# Patient Record
Sex: Male | Born: 1960 | Race: White | Hispanic: No | Smoking: Current every day smoker
Health system: Southern US, Community
[De-identification: ages and names within clinical notes are randomized; demographics above are authoritative.]

## PROBLEM LIST (undated history)

## (undated) DIAGNOSIS — F1120 Opioid dependence, uncomplicated: Secondary | ICD-10-CM

## (undated) DIAGNOSIS — F17200 Nicotine dependence, unspecified, uncomplicated: Secondary | ICD-10-CM

## (undated) DIAGNOSIS — J449 Chronic obstructive pulmonary disease, unspecified: Secondary | ICD-10-CM

## (undated) DIAGNOSIS — I2510 Atherosclerotic heart disease of native coronary artery without angina pectoris: Secondary | ICD-10-CM

## (undated) HISTORY — PX: CARDIAC SURGERY: SHX584

## (undated) HISTORY — PX: CERVICAL SPINE SURGERY: SHX589

## (undated) HISTORY — PX: COLON SURGERY: SHX602

## (undated) HISTORY — PX: OTHER SURGICAL HISTORY: SHX169

---

## 2004-01-11 ENCOUNTER — Ambulatory Visit: Payer: Self-pay | Admitting: Cardiology

## 2004-01-25 ENCOUNTER — Ambulatory Visit: Payer: Self-pay | Admitting: Cardiology

## 2004-02-15 ENCOUNTER — Inpatient Hospital Stay (HOSPITAL_BASED_OUTPATIENT_CLINIC_OR_DEPARTMENT_OTHER): Admission: RE | Admit: 2004-02-15 | Discharge: 2004-02-15 | Payer: Self-pay | Admitting: Cardiology

## 2004-02-15 ENCOUNTER — Ambulatory Visit: Payer: Self-pay | Admitting: *Deleted

## 2004-03-12 ENCOUNTER — Ambulatory Visit: Payer: Self-pay | Admitting: Cardiology

## 2004-03-29 ENCOUNTER — Ambulatory Visit: Payer: Self-pay | Admitting: Cardiology

## 2004-04-18 ENCOUNTER — Inpatient Hospital Stay (HOSPITAL_COMMUNITY)
Admission: RE | Admit: 2004-04-18 | Discharge: 2004-04-22 | Payer: Self-pay | Admitting: Thoracic Surgery (Cardiothoracic Vascular Surgery)

## 2004-05-10 ENCOUNTER — Encounter
Admission: RE | Admit: 2004-05-10 | Discharge: 2004-05-10 | Payer: Self-pay | Admitting: Thoracic Surgery (Cardiothoracic Vascular Surgery)

## 2004-06-03 ENCOUNTER — Ambulatory Visit: Payer: Self-pay | Admitting: Cardiology

## 2004-10-24 ENCOUNTER — Ambulatory Visit: Admission: RE | Admit: 2004-10-24 | Discharge: 2004-10-24 | Payer: Self-pay | Admitting: *Deleted

## 2017-02-11 ENCOUNTER — Encounter (HOSPITAL_COMMUNITY): Payer: Self-pay | Admitting: Emergency Medicine

## 2017-02-11 ENCOUNTER — Emergency Department (HOSPITAL_COMMUNITY): Payer: Medicare Other

## 2017-02-11 ENCOUNTER — Emergency Department (HOSPITAL_COMMUNITY)
Admission: EM | Admit: 2017-02-11 | Discharge: 2017-02-11 | Disposition: A | Discharge status: Home / Self Care | Payer: Medicare Other | Attending: Emergency Medicine | Admitting: Emergency Medicine

## 2017-02-11 DIAGNOSIS — M545 Low back pain: Secondary | ICD-10-CM | POA: Insufficient documentation

## 2017-02-11 DIAGNOSIS — F17210 Nicotine dependence, cigarettes, uncomplicated: Secondary | ICD-10-CM | POA: Insufficient documentation

## 2017-02-11 DIAGNOSIS — R1030 Lower abdominal pain, unspecified: Secondary | ICD-10-CM | POA: Diagnosis present

## 2017-02-11 DIAGNOSIS — J449 Chronic obstructive pulmonary disease, unspecified: Secondary | ICD-10-CM | POA: Insufficient documentation

## 2017-02-11 DIAGNOSIS — R1084 Generalized abdominal pain: Secondary | ICD-10-CM

## 2017-02-11 HISTORY — DX: Chronic obstructive pulmonary disease, unspecified: J44.9

## 2017-02-11 LAB — CBC WITH DIFFERENTIAL/PLATELET
BASOS ABS: 0 10*3/uL (ref 0.0–0.1)
BASOS PCT: 0 %
Eosinophils Absolute: 0 10*3/uL (ref 0.0–0.7)
Eosinophils Relative: 0 %
HEMATOCRIT: 46.4 % (ref 39.0–52.0)
Hemoglobin: 16.3 g/dL (ref 13.0–17.0)
Lymphocytes Relative: 22 %
Lymphs Abs: 2 10*3/uL (ref 0.7–4.0)
MCH: 34 pg (ref 26.0–34.0)
MCHC: 35.1 g/dL (ref 30.0–36.0)
MCV: 96.7 fL (ref 78.0–100.0)
MONO ABS: 0.6 10*3/uL (ref 0.1–1.0)
Monocytes Relative: 7 %
NEUTROS ABS: 6.4 10*3/uL (ref 1.7–7.7)
Neutrophils Relative %: 71 %
PLATELETS: 247 10*3/uL (ref 150–400)
RBC: 4.8 MIL/uL (ref 4.22–5.81)
RDW: 14.3 % (ref 11.5–15.5)
WBC: 9.1 10*3/uL (ref 4.0–10.5)

## 2017-02-11 LAB — COMPREHENSIVE METABOLIC PANEL
ALBUMIN: 4.5 g/dL (ref 3.5–5.0)
ALK PHOS: 73 U/L (ref 38–126)
ALT: 17 U/L (ref 17–63)
ANION GAP: 9 (ref 5–15)
AST: 20 U/L (ref 15–41)
BILIRUBIN TOTAL: 0.9 mg/dL (ref 0.3–1.2)
BUN: 24 mg/dL — ABNORMAL HIGH (ref 6–20)
CALCIUM: 10.6 mg/dL — AB (ref 8.9–10.3)
CO2: 30 mmol/L (ref 22–32)
Chloride: 98 mmol/L — ABNORMAL LOW (ref 101–111)
Creatinine, Ser: 1.03 mg/dL (ref 0.61–1.24)
GLUCOSE: 106 mg/dL — AB (ref 65–99)
Potassium: 4.3 mmol/L (ref 3.5–5.1)
SODIUM: 137 mmol/L (ref 135–145)
TOTAL PROTEIN: 7.7 g/dL (ref 6.5–8.1)

## 2017-02-11 MED ORDER — POLYETHYLENE GLYCOL 3350 17 G PO PACK: 17.0000 g | PACK | Freq: Every day | ORAL | 0 refills | Status: DC

## 2017-02-11 MED ORDER — DICYCLOMINE HCL 20 MG PO TABS: 20.0000 mg | ORAL_TABLET | Freq: Two times a day (BID) | ORAL | 0 refills | Status: DC

## 2017-02-11 NOTE — ED Provider Notes (Signed)
Emergency Department Provider Note   I have reviewed the triage vital signs and the nursing notes.   HISTORY  Chief Complaint Abdominal Pain   HPI Antonio Ramirez is a 56 y.o. male with PMH of COPD and abdominal surgery 1-2 year prior presents to the emergency department for evaluation of pain in the lower abdomen and left lower back.  Patient states that he has had this pain for 8-9 months.  He states that he can feel something moving around inside of him.  He describes it as a flat piece of surgical material with a circle in the center.  He states he is unable to feel this from the outside but has had this discomfort for several months.  He reportedly followed up with his surgeon and primary care physician at [facility] who performed imaging which showed no foreign body.  Patient states that he got conflicting information from the provider and the radiology technician and believes that the medical team lied to him about leaving something inside of him.  Nausea, vomiting, diarrhea.  No fevers or chills.  No sudden worsening pain.  States he came in today because "[name] lied" and he wants a second opinion.    Past Medical History:  Diagnosis Date  . COPD (chronic obstructive pulmonary disease) (HCC)     There are no active problems to display for this patient.   Past Surgical History:  Procedure Laterality Date  . cardiac bypass    . CARDIAC SURGERY    . COLON SURGERY      Current Outpatient Rx  . Order #: [id number] Class: Historical Med  . Order #: [id number] Class: Historical Med  . Order #: [id number] Class: Historical Med  . Order #: [id number] Class: Historical Med  . Order #: [id number] Class: Historical Med  . Order #: [id number] Class: Historical Med  . Order #: [id number] Class: Historical Med  . Order #: [id number] Class: Historical Med  . Order #: [id number] Class: Print  . Order #: [id number] Class: Print    Allergies Opana [oxymorphone hcl]  History reviewed. No pertinent family  history.  Social History Social History  Substance Use Topics  . Smoking status: Current Every Day Smoker    Packs/day: 1.00    Types: Cigarettes  . Smokeless tobacco: Never Used  . Alcohol use No    Review of Systems  Constitutional: No fever/chills Eyes: No visual changes. ENT: No sore throat. Cardiovascular: Denies chest pain. Respiratory: Denies shortness of breath. Gastrointestinal: Positive abdominal pain.  No nausea, no vomiting.  No diarrhea.  No constipation. Genitourinary: Negative for dysuria. Musculoskeletal: Negative for back pain. Skin: Negative for rash. Neurological: Negative for headaches, focal weakness or numbness.  10-point ROS otherwise negative.  ____________________________________________   PHYSICAL EXAM:  VITAL SIGNS: ED Triage Vitals [02/11/17 1100]  Enc Vitals Group     BP (!) 149/110     Pulse Rate (!) 117     Resp 18     Temp 98.2 F (36.8 C)     Temp Source Oral     SpO2 94 %     Weight 180 lb (81.6 kg)     Height 6' (1.829 m)     Pain Score 5   Constitutional: Alert and oriented. Well appearing and in no acute distress. Eyes: Conjunctivae are normal.  Head: Atraumatic. Nose: No congestion/rhinnorhea. Mouth/Throat: Mucous membranes are moist.  Neck: No stridor.  Cardiovascular: Normal rate, regular rhythm. Good peripheral circulation. Grossly normal heart sounds.   Respiratory: Normal respiratory effort.  No  retractions. Lungs CTAB. Gastrointestinal: Soft and nontender. No distention.  Musculoskeletal: No lower extremity tenderness nor edema. No gross deformities of extremities. Neurologic:  Normal speech and language. No gross focal neurologic deficits are appreciated.  Skin:  Skin is warm, dry and intact. No rash noted. Multiple scars noted over the abdomen.   ____________________________________________   LABS (all labs ordered are listed, but only abnormal results are displayed)  Labs Reviewed  COMPREHENSIVE METABOLIC  PANEL - Abnormal; Notable for the following:       Result Value   Chloride 98 (*)    Glucose, Bld 106 (*)    BUN 24 (*)    Calcium 10.6 (*)    All other components within normal limits  CBC WITH DIFFERENTIAL/PLATELET   ____________________________________________  RADIOLOGY  Dg Abdomen Acute W/chest  Result Date: 02/11/2017 CLINICAL DATA:  Abdominal pain. EXAM: DG ABDOMEN ACUTE W/ 1V CHEST COMPARISON:  Chest x-ray dated February 04, 2017. FINDINGS: Distended, air-filled transverse colon. Moderate stool burden, with stool and air seen in the rectum. No pneumoperitoneum. Multiple phleboliths in the pelvis. No radiopaque calculi or other significant radiographic abnormality is seen. Heart size and mediastinal contours are within normal limits. Both lungs are hyperinflated, but clear. IMPRESSION: 1. Distended, air-filled transverse colon, which could reflect a degree of dysmotility. No evidence of bowel obstruction. 2.  Prominent stool throughout the colon favors constipation. 3. COPD.  No active cardiopulmonary disease. Electronically Signed   By: Titus Dubin M.D.   On: 02/11/2017 12:40    ____________________________________________   PROCEDURES  Procedure(s) performed:   Procedures  None ____________________________________________   INITIAL IMPRESSION / ASSESSMENT AND PLAN / ED COURSE  Pertinent labs & imaging results that were available during my care of the patient were reviewed by me and considered in my medical decision making (see chart for details).  Patient presents to the emergency department for evaluation of lower abdominal pain and left lower back pain.  This is been present for 8-9 months.  Patient is convinced that his surgeon from [facility] left some surgical materials inside of him during a surgery 1-2 years prior.  Plans to be feeling these objects move around inside of his abdomen and lower back.  I am unable to palpate anything externally.  His  abdomen is soft and nontender throughout.  No significant worsening symptoms today.  Given the patient's tachycardia and claims plan for baseline labs and plain film the abdomen but discussed with him that I have extremely low suspicion that there is any retained foreign body given that it has been 1-2 years, he is showing no sign of infection, and that these objects would not be migratory in his body.   Plain films and labs negative. Will discharge home on Bentyl and Miralax. Patient to follow up with PCP. Discussed return precautions for any worsening pain. Discussed possibility of hernia with the patient and discussed when to come to the ED if that occurs. Considering that the patient may be having intermittent retractable hernias to explain what he is seeing and describing as something left inside of him after surgery.   At this time, I do not feel there is any life-threatening condition present. I have reviewed and discussed all results (EKG, imaging, lab, urine as appropriate), exam findings with patient. I have reviewed nursing notes and appropriate previous records.  I feel the patient is safe to be discharged home without further emergent workup. Discussed usual and customary return precautions. Patient and family (if present)  verbalize understanding and are comfortable with this plan.  Patient will follow-up with their primary care provider. If they do not have a primary care provider, information for follow-up has been provided to them. All questions have been answered.  ____________________________________________  FINAL CLINICAL IMPRESSION(S) / ED DIAGNOSES  Final diagnoses:  Generalized abdominal pain     MEDICATIONS GIVEN DURING THIS VISIT:  Medications - No data to display   NEW OUTPATIENT MEDICATIONS STARTED DURING THIS VISIT:  Discharge Medication List as of 02/11/2017  1:05 PM    START taking these medications   Details  dicyclomine (BENTYL) 20 MG tablet Take 1 tablet (20  mg total) by mouth 2 (two) times daily., Starting Wed 02/11/2017, Print    polyethylene glycol (MIRALAX / GLYCOLAX) packet Take 17 g by mouth daily., Starting Wed 02/11/2017, Print        Note:  This document was prepared using Dragon voice recognition software and may include unintentional dictation errors.  Nanda Quinton, MD Emergency Medicine    Ara Grandmaison, [facility], MD 02/11/17 1537

## 2017-02-11 NOTE — Discharge Instructions (Signed)

## 2017-02-11 NOTE — ED Triage Notes (Signed)
Pt reports abd pain since abd surgery x1-2 years. Pt denies fever,chills, n/v/d. Pt reports "something was left inside of me."

## 2017-02-11 NOTE — ED Notes (Signed)
Pt states had abd surgery at [facility] a few years ago. Pt states "he had something left in him from his surgery." denies nausea/ vomiting and diarrhea. Bowel movements are normal per pt.

## 2017-02-11 NOTE — ED Notes (Signed)
Patient transported to X-ray 

## 2019-06-01 ENCOUNTER — Encounter (HOSPITAL_COMMUNITY): Payer: Self-pay | Admitting: *Deleted

## 2019-06-01 ENCOUNTER — Emergency Department (HOSPITAL_COMMUNITY): Payer: Medicare Other

## 2019-06-01 ENCOUNTER — Inpatient Hospital Stay (HOSPITAL_COMMUNITY): Payer: Medicare Other

## 2019-06-01 ENCOUNTER — Inpatient Hospital Stay (HOSPITAL_COMMUNITY)
Admission: EM | Admit: 2019-06-01 | Discharge: 2019-06-03 | DRG: 190 | Disposition: A | Discharge status: Home Health | Payer: Medicare Other | Attending: Family Medicine | Admitting: Family Medicine

## 2019-06-01 ENCOUNTER — Other Ambulatory Visit: Payer: Self-pay

## 2019-06-01 DIAGNOSIS — R400 Somnolence: Secondary | ICD-10-CM | POA: Diagnosis present

## 2019-06-01 DIAGNOSIS — N179 Acute kidney failure, unspecified: Secondary | ICD-10-CM | POA: Diagnosis present

## 2019-06-01 DIAGNOSIS — Z79891 Long term (current) use of opiate analgesic: Secondary | ICD-10-CM

## 2019-06-01 DIAGNOSIS — Z20822 Contact with and (suspected) exposure to covid-19: Secondary | ICD-10-CM | POA: Diagnosis present

## 2019-06-01 DIAGNOSIS — R7303 Prediabetes: Secondary | ICD-10-CM | POA: Diagnosis present

## 2019-06-01 DIAGNOSIS — J449 Chronic obstructive pulmonary disease, unspecified: Secondary | ICD-10-CM | POA: Diagnosis not present

## 2019-06-01 DIAGNOSIS — Z720 Tobacco use: Secondary | ICD-10-CM | POA: Diagnosis not present

## 2019-06-01 DIAGNOSIS — Z888 Allergy status to other drugs, medicaments and biological substances status: Secondary | ICD-10-CM

## 2019-06-01 DIAGNOSIS — Z7982 Long term (current) use of aspirin: Secondary | ICD-10-CM | POA: Diagnosis not present

## 2019-06-01 DIAGNOSIS — G9341 Metabolic encephalopathy: Secondary | ICD-10-CM | POA: Diagnosis present

## 2019-06-01 DIAGNOSIS — I2510 Atherosclerotic heart disease of native coronary artery without angina pectoris: Secondary | ICD-10-CM | POA: Diagnosis present

## 2019-06-01 DIAGNOSIS — F1129 Opioid dependence with unspecified opioid-induced disorder: Secondary | ICD-10-CM | POA: Diagnosis not present

## 2019-06-01 DIAGNOSIS — R739 Hyperglycemia, unspecified: Secondary | ICD-10-CM | POA: Diagnosis not present

## 2019-06-01 DIAGNOSIS — M549 Dorsalgia, unspecified: Secondary | ICD-10-CM | POA: Diagnosis present

## 2019-06-01 DIAGNOSIS — I248 Other forms of acute ischemic heart disease: Secondary | ICD-10-CM | POA: Diagnosis present

## 2019-06-01 DIAGNOSIS — G8929 Other chronic pain: Secondary | ICD-10-CM | POA: Diagnosis present

## 2019-06-01 DIAGNOSIS — I4510 Unspecified right bundle-branch block: Secondary | ICD-10-CM | POA: Diagnosis present

## 2019-06-01 DIAGNOSIS — F1120 Opioid dependence, uncomplicated: Secondary | ICD-10-CM | POA: Diagnosis present

## 2019-06-01 DIAGNOSIS — J9602 Acute respiratory failure with hypercapnia: Secondary | ICD-10-CM | POA: Diagnosis present

## 2019-06-01 DIAGNOSIS — F17210 Nicotine dependence, cigarettes, uncomplicated: Secondary | ICD-10-CM | POA: Diagnosis present

## 2019-06-01 DIAGNOSIS — J441 Chronic obstructive pulmonary disease with (acute) exacerbation: Secondary | ICD-10-CM | POA: Diagnosis present

## 2019-06-01 DIAGNOSIS — Z7902 Long term (current) use of antithrombotics/antiplatelets: Secondary | ICD-10-CM | POA: Diagnosis not present

## 2019-06-01 DIAGNOSIS — F32A Depression, unspecified: Secondary | ICD-10-CM | POA: Diagnosis present

## 2019-06-01 DIAGNOSIS — J9601 Acute respiratory failure with hypoxia: Secondary | ICD-10-CM | POA: Diagnosis present

## 2019-06-01 DIAGNOSIS — Z7951 Long term (current) use of inhaled steroids: Secondary | ICD-10-CM | POA: Diagnosis not present

## 2019-06-01 DIAGNOSIS — E785 Hyperlipidemia, unspecified: Secondary | ICD-10-CM | POA: Diagnosis present

## 2019-06-01 DIAGNOSIS — F17200 Nicotine dependence, unspecified, uncomplicated: Secondary | ICD-10-CM | POA: Diagnosis present

## 2019-06-01 DIAGNOSIS — R778 Other specified abnormalities of plasma proteins: Secondary | ICD-10-CM

## 2019-06-01 DIAGNOSIS — I361 Nonrheumatic tricuspid (valve) insufficiency: Secondary | ICD-10-CM | POA: Diagnosis not present

## 2019-06-01 DIAGNOSIS — E860 Dehydration: Secondary | ICD-10-CM | POA: Diagnosis present

## 2019-06-01 DIAGNOSIS — Z79899 Other long term (current) drug therapy: Secondary | ICD-10-CM | POA: Diagnosis not present

## 2019-06-01 DIAGNOSIS — F329 Major depressive disorder, single episode, unspecified: Secondary | ICD-10-CM | POA: Diagnosis present

## 2019-06-01 DIAGNOSIS — Z951 Presence of aortocoronary bypass graft: Secondary | ICD-10-CM

## 2019-06-01 HISTORY — DX: Opioid dependence, uncomplicated: F11.20

## 2019-06-01 HISTORY — DX: Nicotine dependence, unspecified, uncomplicated: F17.200

## 2019-06-01 HISTORY — DX: Atherosclerotic heart disease of native coronary artery without angina pectoris: I25.10

## 2019-06-01 LAB — LIPID PANEL
Cholesterol: 179 mg/dL (ref 0–200)
HDL: 46 mg/dL (ref >40)
LDL Cholesterol: 113 mg/dL — ABNORMAL HIGH (ref 0–99)
Total CHOL/HDL Ratio: 3.9 RATIO
Triglycerides: 100 mg/dL (ref <150)
VLDL: 20 mg/dL (ref 0–40)

## 2019-06-01 LAB — BLOOD GAS, ARTERIAL
Acid-Base Excess: 5.3 mmol/L — ABNORMAL HIGH (ref 0.0–2.0)
Bicarbonate: 27.2 mmol/L (ref 20.0–28.0)
Drawn by: 41977
FIO2: 32
O2 Saturation: 99.6 %
Patient temperature: 37
pCO2 arterial: 61.6 mmHg — ABNORMAL HIGH (ref 32.0–48.0)
pH, Arterial: 7.323 — ABNORMAL LOW (ref 7.350–7.450)
pO2, Arterial: 171 mmHg — ABNORMAL HIGH (ref 83.0–108.0)

## 2019-06-01 LAB — BLOOD GAS, VENOUS
Acid-Base Excess: 4.1 mmol/L — ABNORMAL HIGH (ref 0.0–2.0)
Bicarbonate: 25.7 mmol/L (ref 20.0–28.0)
Delivery systems: POSITIVE
Drawn by: 1528
FIO2: 96
O2 Saturation: 96.2 %
Patient temperature: 37
pCO2, Ven: 65.7 mmHg — ABNORMAL HIGH (ref 44.0–60.0)
pH, Ven: 7.286 (ref 7.250–7.430)
pO2, Ven: 87.3 mmHg — ABNORMAL HIGH (ref 32.0–45.0)

## 2019-06-01 LAB — URINALYSIS, ROUTINE W REFLEX MICROSCOPIC
Bacteria, UA: NONE SEEN
Bilirubin Urine: NEGATIVE
Glucose, UA: NEGATIVE mg/dL
Hgb urine dipstick: NEGATIVE
Ketones, ur: NEGATIVE mg/dL
Leukocytes,Ua: NEGATIVE
Nitrite: NEGATIVE
Protein, ur: 30 mg/dL — AB
Specific Gravity, Urine: 1.026 (ref 1.005–1.030)
pH: 5 (ref 5.0–8.0)

## 2019-06-01 LAB — PROTIME-INR
INR: 1 (ref 0.8–1.2)
Prothrombin Time: 13.4 seconds (ref 11.4–15.2)

## 2019-06-01 LAB — COMPREHENSIVE METABOLIC PANEL
ALT: 11 U/L (ref 0–44)
AST: 18 U/L (ref 15–41)
Albumin: 3.6 g/dL (ref 3.5–5.0)
Alkaline Phosphatase: 58 U/L (ref 38–126)
Anion gap: 10 (ref 5–15)
BUN: 34 mg/dL — ABNORMAL HIGH (ref 6–20)
CO2: 27 mmol/L (ref 22–32)
Calcium: 10.6 mg/dL — ABNORMAL HIGH (ref 8.9–10.3)
Chloride: 96 mmol/L — ABNORMAL LOW (ref 98–111)
Creatinine, Ser: 1.54 mg/dL — ABNORMAL HIGH (ref 0.61–1.24)
GFR calc Af Amer: 56 mL/min — ABNORMAL LOW (ref >60)
GFR calc non Af Amer: 49 mL/min — ABNORMAL LOW (ref >60)
Glucose, Bld: 135 mg/dL — ABNORMAL HIGH (ref 70–99)
Potassium: 5.1 mmol/L (ref 3.5–5.1)
Sodium: 133 mmol/L — ABNORMAL LOW (ref 135–145)
Total Bilirubin: 0.6 mg/dL (ref 0.3–1.2)
Total Protein: 6.8 g/dL (ref 6.5–8.1)

## 2019-06-01 LAB — CBC WITH DIFFERENTIAL/PLATELET
Abs Immature Granulocytes: 0.02 10*3/uL (ref 0.00–0.07)
Basophils Absolute: 0 10*3/uL (ref 0.0–0.1)
Basophils Relative: 0 %
Eosinophils Absolute: 0 10*3/uL (ref 0.0–0.5)
Eosinophils Relative: 0 %
HCT: 54.6 % — ABNORMAL HIGH (ref 39.0–52.0)
Hemoglobin: 17.1 g/dL — ABNORMAL HIGH (ref 13.0–17.0)
Immature Granulocytes: 0 %
Lymphocytes Relative: 12 %
Lymphs Abs: 0.9 10*3/uL (ref 0.7–4.0)
MCH: 33.4 pg (ref 26.0–34.0)
MCHC: 31.3 g/dL (ref 30.0–36.0)
MCV: 106.6 fL — ABNORMAL HIGH (ref 80.0–100.0)
Monocytes Absolute: 1 10*3/uL (ref 0.1–1.0)
Monocytes Relative: 13 %
Neutro Abs: 5.8 10*3/uL (ref 1.7–7.7)
Neutrophils Relative %: 75 %
Platelets: 249 10*3/uL (ref 150–400)
RBC: 5.12 MIL/uL (ref 4.22–5.81)
RDW: 17.2 % — ABNORMAL HIGH (ref 11.5–15.5)
WBC: 7.8 10*3/uL (ref 4.0–10.5)
nRBC: 0.3 % — ABNORMAL HIGH (ref 0.0–0.2)

## 2019-06-01 LAB — HEMOGLOBIN A1C
Hgb A1c MFr Bld: 6.2 % — ABNORMAL HIGH (ref 4.8–5.6)
Mean Plasma Glucose: 131.24 mg/dL

## 2019-06-01 LAB — BRAIN NATRIURETIC PEPTIDE: B Natriuretic Peptide: 535 pg/mL — ABNORMAL HIGH (ref 0.0–100.0)

## 2019-06-01 LAB — TROPONIN I (HIGH SENSITIVITY)
Troponin I (High Sensitivity): 822 ng/L (ref <18)
Troponin I (High Sensitivity): 982 ng/L (ref <18)
Troponin I (High Sensitivity): 763 ng/L (ref <18)

## 2019-06-01 LAB — ECHOCARDIOGRAM COMPLETE
Height: 72 in
Weight: 2730.18 oz

## 2019-06-01 LAB — SALICYLATE LEVEL: Salicylate Lvl: <7 mg/dL — ABNORMAL LOW (ref 7.0–30.0)

## 2019-06-01 LAB — GLUCOSE, CAPILLARY
Glucose-Capillary: 151 mg/dL — ABNORMAL HIGH (ref 70–99)
Glucose-Capillary: 135 mg/dL — ABNORMAL HIGH (ref 70–99)
Glucose-Capillary: 124 mg/dL — ABNORMAL HIGH (ref 70–99)
Glucose-Capillary: 111 mg/dL — ABNORMAL HIGH (ref 70–99)

## 2019-06-01 LAB — RAPID URINE DRUG SCREEN, HOSP PERFORMED
Amphetamines: NOT DETECTED
Barbiturates: NOT DETECTED
Benzodiazepines: NOT DETECTED
Cocaine: NOT DETECTED
Opiates: POSITIVE — AB
Tetrahydrocannabinol: POSITIVE — AB

## 2019-06-01 LAB — AMMONIA: Ammonia: 40 umol/L — ABNORMAL HIGH (ref 9–35)

## 2019-06-01 LAB — FOLATE: Folate: 8.4 ng/mL (ref >5.9)

## 2019-06-01 LAB — ETHANOL: Alcohol, Ethyl (B): <10 mg/dL (ref <10)

## 2019-06-01 LAB — LACTIC ACID, PLASMA
Lactic Acid, Venous: 1.9 mmol/L (ref 0.5–1.9)
Lactic Acid, Venous: 1.3 mmol/L (ref 0.5–1.9)

## 2019-06-01 LAB — RESPIRATORY PANEL BY RT PCR (FLU A&B, COVID)
Influenza A by PCR: NEGATIVE
Influenza B by PCR: NEGATIVE
SARS Coronavirus 2 by RT PCR: NEGATIVE

## 2019-06-01 LAB — ACETAMINOPHEN LEVEL: Acetaminophen (Tylenol), Serum: <10 ug/mL — ABNORMAL LOW (ref 10–30)

## 2019-06-01 LAB — HIV ANTIBODY (ROUTINE TESTING W REFLEX): HIV Screen 4th Generation wRfx: NONREACTIVE

## 2019-06-01 LAB — VITAMIN D 25 HYDROXY (VIT D DEFICIENCY, FRACTURES): Vit D, 25-Hydroxy: 40.53 ng/mL (ref 30–100)

## 2019-06-01 LAB — HEPARIN LEVEL (UNFRACTIONATED): Heparin Unfractionated: <0.1 IU/mL — ABNORMAL LOW (ref 0.30–0.70)

## 2019-06-01 LAB — VITAMIN B12: Vitamin B-12: 559 pg/mL (ref 180–914)

## 2019-06-01 LAB — TSH: TSH: 0.447 u[IU]/mL (ref 0.350–4.500)

## 2019-06-01 LAB — APTT: aPTT: 29 seconds (ref 24–36)

## 2019-06-01 MED ORDER — SODIUM CHLORIDE 0.9 % IV SOLN
100.0000 mg | Freq: Two times a day (BID) | INTRAVENOUS | Status: DC
  Administered 2019-06-01 (×2): 100 mg via INTRAVENOUS
  Filled 2019-06-01 (×3): qty 100

## 2019-06-01 MED ORDER — SENNOSIDES-DOCUSATE SODIUM 8.6-50 MG PO TABS
2.0000 | ORAL_TABLET | Freq: Every day | ORAL | Status: DC
  Administered 2019-06-01 – 2019-06-02 (×2): 2 via ORAL
  Filled 2019-06-01 (×2): qty 2

## 2019-06-01 MED ORDER — CHLORHEXIDINE GLUCONATE CLOTH 2 % EX PADS
6.0000 | MEDICATED_PAD | Freq: Every day | CUTANEOUS | Status: DC
  Administered 2019-06-01 – 2019-06-02 (×2): 6 via TOPICAL

## 2019-06-01 MED ORDER — INSULIN ASPART 100 UNIT/ML ~~LOC~~ SOLN
4.0000 [IU] | Freq: Three times a day (TID) | SUBCUTANEOUS | Status: DC
  Administered 2019-06-01: 4 [IU] via SUBCUTANEOUS

## 2019-06-01 MED ORDER — ATORVASTATIN CALCIUM 40 MG PO TABS
40.0000 mg | ORAL_TABLET | Freq: Every day | ORAL | Status: DC
  Administered 2019-06-01 – 2019-06-02 (×2): 40 mg via ORAL
  Filled 2019-06-01 (×2): qty 1

## 2019-06-01 MED ORDER — NICOTINE 21 MG/24HR TD PT24
21.0000 mg | MEDICATED_PATCH | Freq: Every day | TRANSDERMAL | Status: DC
  Administered 2019-06-01 – 2019-06-03 (×3): 21 mg via TRANSDERMAL
  Filled 2019-06-01 (×3): qty 1

## 2019-06-01 MED ORDER — ACETAMINOPHEN 325 MG PO TABS: 650.0000 mg | ORAL_TABLET | Freq: Four times a day (QID) | ORAL | Status: DC | PRN

## 2019-06-01 MED ORDER — INSULIN ASPART 100 UNIT/ML ~~LOC~~ SOLN
0.0000 [IU] | Freq: Three times a day (TID) | SUBCUTANEOUS | Status: DC
  Administered 2019-06-01 – 2019-06-02 (×2): 1 [IU] via SUBCUTANEOUS

## 2019-06-01 MED ORDER — ENOXAPARIN SODIUM 40 MG/0.4ML ~~LOC~~ SOLN: 40.0000 mg | SUBCUTANEOUS | Status: DC

## 2019-06-01 MED ORDER — HEPARIN BOLUS VIA INFUSION
4000.0000 [IU] | Freq: Once | INTRAVENOUS | Status: AC
  Administered 2019-06-01: 4000 [IU] via INTRAVENOUS

## 2019-06-01 MED ORDER — ORAL CARE MOUTH RINSE
15.0000 mL | Freq: Two times a day (BID) | OROMUCOSAL | Status: DC
  Administered 2019-06-01 – 2019-06-02 (×2): 15 mL via OROMUCOSAL

## 2019-06-01 MED ORDER — HEPARIN BOLUS VIA INFUSION: 4000.0000 [IU] | Freq: Once | INTRAVENOUS | Status: DC

## 2019-06-01 MED ORDER — IPRATROPIUM-ALBUTEROL 0.5-2.5 (3) MG/3ML IN SOLN
3.0000 mL | Freq: Four times a day (QID) | RESPIRATORY_TRACT | Status: DC
  Administered 2019-06-01: 3 mL via RESPIRATORY_TRACT
  Filled 2019-06-01: qty 3

## 2019-06-01 MED ORDER — ALBUTEROL (5 MG/ML) CONTINUOUS INHALATION SOLN
10.0000 mg/h | INHALATION_SOLUTION | Freq: Once | RESPIRATORY_TRACT | Status: AC
  Administered 2019-06-01: 10 mg/h via RESPIRATORY_TRACT
  Filled 2019-06-01: qty 20

## 2019-06-01 MED ORDER — IPRATROPIUM BROMIDE 0.02 % IN SOLN
0.5000 mg | Freq: Once | RESPIRATORY_TRACT | Status: AC
  Administered 2019-06-01: 0.5 mg via RESPIRATORY_TRACT
  Filled 2019-06-01: qty 2.5

## 2019-06-01 MED ORDER — ACETAMINOPHEN 650 MG RE SUPP: 650.0000 mg | Freq: Four times a day (QID) | RECTAL | Status: DC | PRN

## 2019-06-01 MED ORDER — HEPARIN (PORCINE) 25000 UT/250ML-% IV SOLN: 1100.0000 [IU]/h | INTRAVENOUS | Status: DC

## 2019-06-01 MED ORDER — SODIUM CHLORIDE 0.9 % IV SOLN: INTRAVENOUS | Status: AC

## 2019-06-01 MED ORDER — ASPIRIN EC 81 MG PO TBEC
81.0000 mg | DELAYED_RELEASE_TABLET | Freq: Every day | ORAL | Status: DC
  Administered 2019-06-01 – 2019-06-03 (×3): 81 mg via ORAL
  Filled 2019-06-01 (×3): qty 1

## 2019-06-01 MED ORDER — ENOXAPARIN SODIUM 40 MG/0.4ML ~~LOC~~ SOLN
40.0000 mg | SUBCUTANEOUS | Status: DC
  Administered 2019-06-01 – 2019-06-02 (×2): 40 mg via SUBCUTANEOUS
  Filled 2019-06-01 (×2): qty 0.4

## 2019-06-01 MED ORDER — ONDANSETRON HCL 4 MG/2ML IJ SOLN: 4.0000 mg | Freq: Four times a day (QID) | INTRAMUSCULAR | Status: DC | PRN

## 2019-06-01 MED ORDER — HEPARIN (PORCINE) 25000 UT/250ML-% IV SOLN
1000.0000 [IU]/h | INTRAVENOUS | Status: DC
  Administered 2019-06-01: 1000 [IU]/h via INTRAVENOUS
  Filled 2019-06-01: qty 250

## 2019-06-01 MED ORDER — METHYLPREDNISOLONE SODIUM SUCC 125 MG IJ SOLR
60.0000 mg | Freq: Four times a day (QID) | INTRAMUSCULAR | Status: DC
  Administered 2019-06-01 – 2019-06-02 (×4): 60 mg via INTRAVENOUS
  Filled 2019-06-01 (×4): qty 2

## 2019-06-01 MED ORDER — PANTOPRAZOLE SODIUM 40 MG IV SOLR
40.0000 mg | INTRAVENOUS | Status: DC
  Administered 2019-06-01: 40 mg via INTRAVENOUS
  Filled 2019-06-01: qty 40

## 2019-06-01 MED ORDER — ONDANSETRON HCL 4 MG PO TABS: 4.0000 mg | ORAL_TABLET | Freq: Four times a day (QID) | ORAL | Status: DC | PRN

## 2019-06-01 MED ORDER — METHYLPREDNISOLONE SODIUM SUCC 125 MG IJ SOLR
125.0000 mg | Freq: Once | INTRAMUSCULAR | Status: AC
  Administered 2019-06-01: 125 mg via INTRAVENOUS
  Filled 2019-06-01: qty 2

## 2019-06-01 MED ORDER — IPRATROPIUM-ALBUTEROL 0.5-2.5 (3) MG/3ML IN SOLN: 3.0000 mL | Freq: Four times a day (QID) | RESPIRATORY_TRACT | Status: DC | PRN

## 2019-06-01 MED ORDER — SODIUM CHLORIDE 0.9 % IV BOLUS
1000.0000 mL | Freq: Once | INTRAVENOUS | Status: AC
  Administered 2019-06-01: 1000 mL via INTRAVENOUS

## 2019-06-01 NOTE — Progress Notes (Signed)
*  PRELIMINARY RESULTS* Echocardiogram 2D Echocardiogram has been performed.  Antonio Ramirez 06/01/2019, 9:53 AM

## 2019-06-01 NOTE — ED Notes (Signed)
Date and time results received: 06/01/19 2:34 AM  (use smartphrase ".now" to insert current time)  Test: trop  Critical Value: 822  Name of Provider Notified:  Tomi Bamberger   Orders Received? Or Actions Taken?: Orders Received - See Orders for details

## 2019-06-01 NOTE — ED Triage Notes (Signed)
Pt brought in by [facility] for c/o ams; pt was initially combative on scene and leo was called; cbg 190; family reported to ems that pt is usually alert and oriented; pt is unable to answer any questions
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
## 2019-06-01 NOTE — Progress Notes (Signed)
ANTICOAGULATION CONSULT NOTE - Initial Consult  Pharmacy Consult for Heparin Indication: ACS/STEMI  Allergies  Allergen Reactions  . Opana [Oxymorphone Hcl]     hives    Patient Measurements: Height: 6' (182.9 cm) Weight: 188 lb 3.2 oz (85.4 kg) IBW/kg (Calculated) : 77.6 HEPARIN DW (KG): 85.4  Vital Signs: Temp: 98.7 F (37.1 C) (02/17 0134) Temp Source: Oral (02/17 0134) BP: 127/84 (02/17 0630) Pulse Rate: 73 (02/17 0630)  Labs: Recent Labs    06/01/19 0131 06/01/19 0241  HGB 17.1*  --   HCT 54.6*  --   PLT 249  --   CREATININE 1.54*  --   TROPONINIHS 822* 982*    Estimated Creatinine Clearance: 56.7 mL/min (A) (by C-G formula based on SCr of 1.54 mg/dL (H)).   Medical History: Past Medical History:  Diagnosis Date  . CAD (coronary artery disease)   . COPD (chronic obstructive pulmonary disease) ([place])   . Opioid dependence ([place])   . Smoker     Medications:  See med rec  Assessment: Patient presented with altered mental status. He has acute respiratory failure with hypoxia. Troponins are elevated. Pharmacy asked to dose heparin  Goal of Therapy:  Heparin level 0.3-0.7 units/ml Monitor platelets by anticoagulation protocol: Yes   Plan:  Give 4000 units bolus x 1 Start heparin infusion at 1000 units/hr Check anti-Xa level in ~6-8 hours and daily while on heparin Continue to monitor H&H and platelets  Isac Sarna, BS Vena [place], BCPS Clinical Pharmacist Pager [phone] 06/01/2019,7:38 AM

## 2019-06-01 NOTE — Consult Note (Addendum)
Cardiology Consult    Patient ID: Antonio Ramirez; PP:[id number]; [birth year]   Admit date: 06/01/2019 Date of Consult: 06/01/2019  Primary Care Provider: [name], DO Primary Cardiologist: New to [facility] - Antonio Ramirez  Patient Profile    Antonio Ramirez is a 59 y.o. male with past medical history of CAD (s/p CABG in 2006 but details unavailable by review of the chart), HLD, chronic back pain and tobacco use who is being seen today for the evaluation of elevated troponin values at the request of Antonio Ramirez and Antonio Ramirez.   History of Present Illness    Antonio Ramirez initially presented to [facility] ED earlier this morning for evaluation of altered mental status after his girlfriend reported he was "not acting right".  There were several Oxycodone pills on his bed and he was unable to to tell EMS his birthday.  By review of notes, oxygen saturations were initially in the 80's but improved into the 90's with 4 L nasal cannula.  Initial labs showed WBC 7.8, Hgb 17.1, platelets 249, Na+ 133, K+ 5.1 and creatinine 1.54 (previously 1.03 in 2019). Lactic Acid 1.9. Ethanol negative. UDS pending. Initial HS Troponin 822 with repeat of 982. ABG showing pH 7.323 with pCO2 61.6.  EKG shows NSR, HR 80 with RBBB and LPFB with no prior tracings available for comparison. CXR showed chronic hyperinflation and bronchial thickening with no aortic atherosclerosis. CT Head showed no acute intracranial abnormality but was noted to have generalized atrophy, advanced for age and encephalomalacia in the right temporal occipital lobe consistent with prior ischemia.  He initially required BiPAP but now has been placed on 3 L nasal cannula with appropriate saturations.  In talking with the patient this morning, he is now alert and oriented x3. He reports walking to the restroom and bending over to pick up a cigarette and that is the last thing he can recall prior to admission. He is currently able to tell me  his name, date of birth, location and discusses the recent retirement of his prior PCP, Antonio Ramirez.   He says he has not followed with Cardiology as an outpatient as issues were being managed by his PCP previously. He is not active at baseline secondary to chronic back pain but is able to perform household chores. He denies any recent chest pain or dyspnea on exertion with these activities. No recent orthopnea, palpitations or PND.  He does experience occasional lower extremity edema and says he was on a fluid pill in the past.  He does smoke 1 pack/day but denies any alcohol use or recreational drug use.  He is able to tell me he had CABG in the past.  He is unaware of any cardiac catheterizations or stent placements in the interim.  Past Medical History:  Diagnosis Date  . CAD (coronary artery disease)   . COPD (chronic obstructive pulmonary disease) ([place])   . Opioid dependence ([place])   . Smoker     Past Surgical History:  Procedure Laterality Date  . cardiac bypass    . CARDIAC SURGERY    . COLON SURGERY       Home Medications:  Prior to Admission medications   Medication Sig Start Date End Date Taking? Authorizing Provider  albuterol (PROVENTIL HFA;VENTOLIN HFA) 108 (90 Base) MCG/ACT inhaler Inhale 1-2 puffs into the lungs every 6 (six) hours as needed for wheezing or shortness of breath.    Antonio Ramirez  aspirin EC 325  MG tablet Take 325 mg by mouth daily.    Antonio Ramirez  citalopram (CELEXA) 10 MG tablet Take 1 tablet by mouth daily. 02/02/17   Antonio Ramirez  clopidogrel (PLAVIX) 75 MG tablet Take 1 tablet by mouth daily. 02/11/17   Antonio Ramirez  dicyclomine (BENTYL) 20 MG tablet Take 1 tablet (20 mg total) by mouth 2 (two) times daily. 02/11/17   Long, [employer], Ramirez  isosorbide mononitrate (IMDUR) 30 MG 24 hr tablet Take 1 tablet by mouth daily. 01/22/17   Antonio Ramirez  Oxycodone HCl 20 MG TABS Take 1 tablet by mouth every  8 (eight) hours as needed for pain. 01/19/17   Antonio Ramirez  OXYCONTIN 40 MG 12 hr tablet Take 1 tablet by mouth every 12 (twelve) hours. 01/19/17   Antonio Ramirez  polyethylene glycol (MIRALAX / GLYCOLAX) packet Take 17 g by mouth daily. 02/11/17   Long, [employer], Ramirez  simvastatin (ZOCOR) 20 MG tablet Take 1 tablet by mouth daily at 6 PM.  02/11/17   Antonio Ramirez    Inpatient Medications: Scheduled Meds:  Continuous Infusions:  PRN Meds:   Allergies:    Allergies  Allergen Reactions  . Opana [Oxymorphone Hcl]     hives    Social History:   Social History   Socioeconomic History  . Marital status: Divorced    Spouse name: Not on file  . Number of children: Not on file  . Years of education: Not on file  . Highest education level: Not on file  Occupational History  . Not on file  Tobacco Use  . Smoking status: Current Every Day Smoker    Packs/day: 1.00    Types: Cigarettes  . Smokeless tobacco: Never Used  Substance and Sexual Activity  . Alcohol use: No  . Drug use: No  . Sexual activity: Not on file  Other Topics Concern  . Not on file  Social History Narrative  . Not on file   Social Determinants of Health   Financial Resource Strain:   . Difficulty of Paying Living Expenses: Not on file  Food Insecurity:   . Worried About Charity fundraiser in the Last Year: Not on file  . Ran Out of Food in the Last Year: Not on file  Transportation Needs:   . Lack of Transportation (Medical): Not on file  . Lack of Transportation (Non-Medical): Not on file  Physical Activity:   . Days of Exercise per Week: Not on file  . Minutes of Exercise per Session: Not on file  Stress:   . Feeling of Stress : Not on file  Social Connections:   . Frequency of Communication with Friends and Family: Not on file  . Frequency of Social Gatherings with Friends and Family: Not on file  . Attends Religious Services: Not on file  . Active Member of  Clubs or Organizations: Not on file  . Attends Archivist Meetings: Not on file  . Marital Status: Not on file  Intimate Partner Violence:   . Fear of Current or Ex-Partner: Not on file  . Emotionally Abused: Not on file  . Physically Abused: Not on file  . Sexually Abused: Not on file     Family History:   Family History  Problem Relation Age of Onset  . COPD Mother     Review of Systems    General:  No chills, fever, night sweats or weight changes.  Cardiovascular:  No chest pain, dyspnea on exertion, edema, orthopnea, palpitations, paroxysmal nocturnal dyspnea. Dermatological: No rash, lesions/masses Respiratory: No cough, dyspnea Urologic: No hematuria, dysuria Abdominal:   No nausea, vomiting, diarrhea, bright red blood per rectum, melena, or hematemesis Neurologic:  No visual changes or wkns. Positive for changes in mental status.  All other systems reviewed and are otherwise negative except as noted above.  Physical Exam/Data    Vitals:   06/01/19 0630 06/01/19 0729 06/01/19 0738 06/01/19 0754  BP: 127/84     Pulse: 73     Resp: 18     Temp:      TempSrc:      SpO2: 95%   98%  Weight:  85.4 kg    Height:  6' (1.829 m) 6' (1.829 m)     Intake/Output Summary (Last 24 hours) at 06/01/2019 0825 Last data filed at 06/01/2019 0756 Gross per 24 hour  Intake 1000 ml  Output --  Net 1000 ml   Filed Weights   06/01/19 0729  Weight: 85.4 kg   Body mass index is 25.52 kg/m.   General: Pleasant male appearing in NAD Psych: Normal affect. Neuro: Alert and oriented X 3. Moves all extremities spontaneously. HEENT: Normal  Neck: Supple without bruits or JVD. Lungs:  Resp regular and unlabored, occasional rhonchi. Heart: RRR no s3, s4, or murmurs. Abdomen: Soft, non-tender, non-distended, BS + x 4.  Extremities: No clubbing, cyanosis. Trace lower extremity edema bilaterally. DP/PT/Radials 2+ and equal bilaterally.   EKG:  The EKG was personally reviewed  and demonstrates: NSR, HR 80 with RBBB and LPFB with no prior tracings available for comparison.  Telemetry:  Telemetry was personally reviewed and demonstrates: NSR, HR in 60's to 80's. No significant arrhythmias.    Labs/Studies     Relevant CV Studies:  Echocardiogram: Pending  Laboratory Data:  Chemistry Recent Labs  Lab 06/01/19 0131  NA 133*  K 5.1  CL 96*  CO2 27  GLUCOSE 135*  BUN 34*  CREATININE 1.54*  CALCIUM 10.6*  GFRNONAA 49*  GFRAA 56*  ANIONGAP 10    Recent Labs  Lab 06/01/19 0131  PROT 6.8  ALBUMIN 3.6  AST 18  ALT 11  ALKPHOS 58  BILITOT 0.6   Hematology Recent Labs  Lab 06/01/19 0131  WBC 7.8  RBC 5.12  HGB 17.1*  HCT 54.6*  MCV 106.6*  MCH 33.4  MCHC 31.3  RDW 17.2*  PLT 249   Cardiac EnzymesNo results for input(s): TROPONINI in the last 168 hours. No results for input(s): TROPIPOC in the last 168 hours.  BNPNo results for input(s): BNP, PROBNP in the last 168 hours.  DDimer No results for input(s): DDIMER in the last 168 hours.  Radiology/Studies:  CT Head Wo Contrast  Result Date: 06/01/2019 CLINICAL DATA:  Altered mental status. EXAM: CT HEAD WITHOUT CONTRAST TECHNIQUE: Contiguous axial images were obtained from the base of the skull through the vertex without intravenous contrast. COMPARISON:  Report from head CT and brain MRI June 2015 at [facility] os puddle, images not available. FINDINGS: Brain: Generalized atrophy is advanced for age. Ventricular dilatation is slightly out of proportion to sulcal prominence. Encephalomalacia in the right temporal occipital lobe consistent with prior ischemia. Periventricular white matter hypodensity is nonspecific but likely chronic small vessel ischemia. No hemorrhage. No evidence of acute ischemia. No subdural or extra-axial collection. Vascular: No hyperdense vessel or unexpected calcification. Skull: No fracture or focal lesion. Sinuses/Orbits: No acute findings. Mastoid air cells are  hypoplastic. Frontal sinuses are hypoplastic. Other: None. IMPRESSION: 1. No acute intracranial abnormality. 2. Generalized atrophy, advanced for age. Ventricular dilatation is likely secondary to central atrophy, cannot exclude normal pressure hydrocephalus. 3. Encephalomalacia in the right temporal occipital lobe consistent with prior ischemia. Periventricular white matter hypodensity is likely chronic small vessel ischemia. Electronically Signed   By: Keith Rake M.D.   On: 06/01/2019 01:58   DG Chest Port 1 View  Result Date: 06/01/2019 CLINICAL DATA:  Altered mental status. Hypoxia. EXAM: PORTABLE CHEST 1 VIEW COMPARISON:  Radiograph 09/16/2013 FINDINGS: Chronic hyperinflation and bronchial thickening. Post median sternotomy. Heart is normal in size. Atherosclerosis of the thoracic aorta. No pulmonary edema, pleural effusion or pneumothorax. No focal airspace disease. Surgical hardware in the lower cervical spine is partially included. IMPRESSION: Chronic hyperinflation and bronchial thickening. No acute abnormality. Aortic Atherosclerosis (ICD10-I70.0). Electronically Signed   By: Keith Rake M.D.   On: 06/01/2019 01:41     Assessment & Plan    1. Elevated Troponin Values - Patient presented initially for evaluation of AMS and was found to have acute hypoxic respiratory failure as outlined above. By review of the initial notes, there was concern for possible overdose of his pain medication. -  Initial HS Troponin 822 with repeat of 982. EKG shows NSR, HR 80 with RBBB and LPFB with no prior tracings available for comparison.  - he denies any recent chest pain or dyspnea on exertion in the past few weeks leading to admission and is unaware of any acute symptoms that occurred prior to his event.  - will obtain an echocardiogram initially to assess LV function and wall motion. Continue Heparin for now along with ASA and statin therapy. If EF is reduced, would need to consider further  ischemic evaluation given the time frame since his CABG but this is complicated by the fact of poor compliance as an outpatient and no Cardiology follow-up in 15+ years.   2. CAD - s/p CABG in 2006 but details unavailable by review of the chart. The patient is unaware of any interventions in the interim and I do not see any records of this by review of Epic or Care Everywhere. - Echocardiogram is pending to assess LV function and wall motion. Would continue PTA ASA (reduce dosing to 81mg  daily), statin and Imdur. Was also listed as being on Plavix but given the time since his CABG and no recent revascularization, will review with Antonio Ramirez in regards to stopping this.   3. HLD - repeat FLP is pending. Goal LDL is less than 70 with known CAD. He was on Simvastatin 20mg  daily PTA. If LDL above goal, would recommend switching to Crestor or Atorvastatin.   4. Acute Hypoxic Respiratory Failure - unclear etiology as he had AMS on admission and there was concern about over-dosing on pain medication. He also has a 40+ pack-year history and has known COPD. Will add BNP to AM labs. Echo pending. - initially on BiPAP but saturations now appropriate on 3L [place].    For questions or updates, please contact [place] Please consult [email or web address] for contact info under Cardiology/STEMI.  Signed, Erma Heritage, PA-C 06/01/2019, 8:25 AM Pager: [phone]  The patient was seen and examined, and I agree with the history, physical exam, assessment and plan as documented above, with modifications made above and as noted below. I have also personally reviewed all relevant documentation, old records, labs, and both radiographic and cardiovascular studies. I have also independently interpreted old  and new ECG's.  Briefly, this is a 59 year old male with a history of coronary disease and CABG in 2006.  Details are unclear.  He has a history of tobacco use, hyperlipidemia, and chronic back pain.  He  presented to the hospital ED earlier this morning with altered mental status.  He was initially hypoxic but then improved with 4 L nasal cannula.  As per EMR review, there were several oxycodone pills on his bed.  Labs reviewed above.  Urine rapid drug screen positive for opiates and tetrahydrocannabinol.  I personally reviewed the ECG which showed sinus rhythm with right bundle branch block and left posterior fascicular block.  I personally reviewed the echocardiogram which demonstrated normal LV systolic function and regional wall motion, with moderate right ventricular dilatation and moderately reduced RV systolic function.  He currently denies chest pain and palpitations and says breathing is improved.  Each time I asked him how long he smoked he said it has been over 20 years.  He then told me he began smoking snuff when he was a child.  He currently smokes at least 1 pack of cigarettes daily.  We have been consulted for high-sensitivity troponin elevation which peaked at 982.  Recommendations: I suspect troponin elevation is secondary to demand ischemia in the context of acute hypoxic respiratory failure which has since resolved.  He currently denies any anginal symptoms.  LV systolic function and regional wall motion are normal.  Right ventricular dilatation and systolic dysfunction are likely secondary to longstanding COPD due to chronic tobacco use.   I would recommend continuing aspirin with reduction to 81 mg daily.  LDL is 113 with goal less than 70.  I would recommend switching simvastatin to atorvastatin 40 mg daily.  Continue Imdur.  I will discontinue heparin.  He no longer needs clopidogrel.  [facility] will sign off.   Medication Recommendations: As above Other recommendations (labs, testing, etc): None Follow up as an outpatient: We will schedule    Kate Sable, Ramirez, [facility]  06/01/2019 10:42 AM

## 2019-06-01 NOTE — ED Notes (Signed)
Patient denies pain and is resting comfortably.  

## 2019-06-01 NOTE — Progress Notes (Signed)
ANTICOAGULATION CONSULT NOTE -  Pharmacy Consult for heparin--> lovenox Indication: DVT px  Allergies  Allergen Reactions  . Opana [Oxymorphone Hcl]     hives    Patient Measurements: Height: 6' (182.9 cm) Weight: 170 lb 10.2 oz (77.4 kg) IBW/kg (Calculated) : 77.6 HEPARIN DW (KG): 77.4  Vital Signs: Temp: 98.3 F (36.8 C) (02/17 0857) Temp Source: Oral (02/17 0857) BP: 112/85 (02/17 1000) Pulse Rate: 98 (02/17 1000)  Labs: Recent Labs    06/01/19 0131 06/01/19 0241 06/01/19 0733 06/01/19 0952  HGB 17.1*  --   --   --   HCT 54.6*  --   --   --   PLT 249  --   --   --   APTT  --   --  29  --   LABPROT  --   --  13.4  --   INR  --   --  1.0  --   HEPARINUNFRC  --   --  <0.10*  --   CREATININE 1.54*  --   --   --   TROPONINIHS 822* 982*  --  763*    Estimated Creatinine Clearance: 56.5 mL/min (A) (by C-G formula based on SCr of 1.54 mg/dL (H)).   Medical History: Past Medical History:  Diagnosis Date  . CAD (coronary artery disease)   . COPD (chronic obstructive pulmonary disease) ([place])   . Opioid dependence ([place])   . Smoker     Medications:  See med rec  Assessment: Patient presented with altered mental status. He has acute respiratory failure with hypoxia. Troponins are elevated but likely d/t secondary demand ischemia with his acute respiratory failure. Heparin no longer warranted, will transition to DVT px with lovenox  Goal of Therapy:  Monitor platelets by anticoagulation protocol: Yes   Plan:  Heparin discontinued Lovenox 40mg  sq q24h Monitor for s/s of bleeding  Isac Sarna, BS [place], BCPS Clinical Pharmacist Pager [phone] 06/01/2019,11:29 AM

## 2019-06-01 NOTE — H&P (Signed)
History and Physical  Antonio Ramirez [id number] DOB: July 19, 1960 DOA: 06/01/2019  Referring physician: Tomi Bamberger   PCP: [name], DO   Pt coming from: Home by EMS  Chief Complaint: Altered mentation   Historian: Patient (poor historian), ED records, EMS records  HPI: Antonio Ramirez is a 59 y.o. male 1 ppd smoker with CAD s/p CABG, COPD, chronic back pain with opioid dependence who presented to ED by EMS when significant other found him with altered mentation lying in the bed reported that he had not been acting his regular self.  The patient was confused and had poor recall.  According to EMS records there were several oxycodone tablets on the patient's bed but the bottle was mostly full.  He never received naloxone according to records review.  The patient was evaluated in the emergency department and noted to be hypoxic with a pulse ox in the low 80% range.  He was placed on 4 L nasal cannula with improvement to 96%.  His ABG was concerning with an elevated PCO2 of 64.  The patient was briefly placed on BiPAP therapy.  His neurological exam was nonfocal.  He was started on treatment for COPD exacerbation with IV steroids and nebulizer treatments.  His troponin was initially elevated at 822.  He has had poor cardiology follow up since CABG in 2006.  Repeat high-sensitivity troponin was 92.  LDL was 113.  Lactic acid 1.3.  Glucose 151.  Sodium 133, potassium 5.1, creatinine 1.54, BUN 34, calcium 10.6, WBC 7.8, hemoglobin 17.1, platelet count 249.  SARS 2 coronavirus test negative.  Urine drug screen positive for opioids and marijuana.  Salicylate level less than 7.0.  CT brain no acute findings.  ED doctor consulted to cardiology regarding the elevated troponins and patient was started on IV heparin infusion.  Admission was requested.    Review of Systems: UTO due to confusion.  Past Medical History:  Diagnosis Date   CAD (coronary artery disease)    COPD (chronic obstructive  pulmonary disease) ([place])    Opioid dependence ([place])    Smoker    Past Surgical History:  Procedure Laterality Date   cardiac bypass     CARDIAC SURGERY     COLON SURGERY     Social History:  reports that he has been smoking cigarettes. He has been smoking about 1.00 pack per day. He has never used smokeless tobacco. He reports that he does not drink alcohol or use drugs.  Allergies  Allergen Reactions   Opana [Oxymorphone Hcl]     hives    History reviewed. No pertinent family history.  Prior to Admission medications   Medication Sig Start Date End Date Taking? Authorizing Provider  albuterol (PROVENTIL HFA;VENTOLIN HFA) 108 (90 Base) MCG/ACT inhaler Inhale 1-2 puffs into the lungs every 6 (six) hours as needed for wheezing or shortness of breath.    [provider]  aspirin EC 325 MG tablet Take 325 mg by mouth daily.    [provider]  citalopram (CELEXA) 10 MG tablet Take 1 tablet by mouth daily. 02/02/17   [provider]  clopidogrel (PLAVIX) 75 MG tablet Take 1 tablet by mouth daily. 02/11/17   [provider]  dicyclomine (BENTYL) 20 MG tablet Take 1 tablet (20 mg total) by mouth 2 (two) times daily. 02/11/17   Long, Wonda Olds, MD  isosorbide mononitrate (IMDUR) 30 MG 24 hr tablet Take 1 tablet by mouth daily. 01/22/17   [provider]  Oxycodone HCl 20 MG TABS Take 1 tablet by mouth every 8 (eight) hours as needed for pain. 01/19/17   [provider]  OXYCONTIN 40 MG 12 hr tablet Take 1 tablet by mouth every 12 (twelve) hours. 01/19/17   [provider]  polyethylene glycol (MIRALAX / GLYCOLAX) packet Take 17 g by mouth daily. 02/11/17   Long, Wonda Olds, MD  simvastatin (ZOCOR) 20 MG tablet Take 1 tablet by mouth daily at 6 PM.  02/11/17   [provider]   Physical Exam: Vitals:   06/01/19 0600 06/01/19 0615 06/01/19 0630 06/01/19 0729  BP: 114/84  127/84   Pulse: 68 73 73   Resp: 19 18 18      Temp:      TempSrc:      SpO2: 94% 95% 95%   Weight:    85.4 kg     General exam: Moderately built and nourished patient, lying comfortably supine in no obvious distress.  He is awake, but confused.  Intermittently answering questions.   Head, eyes and ENT: Nontraumatic and normocephalic. Pupils pinpoint. Poor dentition. Oral mucosa dry.  Neck: Supple. No JVD, carotid bruit or thyromegaly.  Lymphatics: No lymphadenopathy.  Respiratory system: Midline chest scar well healed. Clear to auscultation. No increased work of breathing.  Cardiovascular system: S1 and S2 heard. No JVD, murmurs, gallops, clicks or pedal edema.  Gastrointestinal system: Abdomen is nondistended, soft and nontender. Normal bowel sounds heard. No organomegaly or masses appreciated.  Central nervous system: somnolent and confused. No focal neurological deficits.  Extremities: Symmetric 5 x 5 power. Peripheral pulses symmetrically felt.   Skin: No rashes or acute findings.  Musculoskeletal system: Negative exam.  Psychiatry: Flat affect and somnolent.  Labs on Admission:  Basic Metabolic Panel: Recent Labs  Lab 06/01/19 0131  NA 133*  K 5.1  CL 96*  CO2 27  GLUCOSE 135*  BUN 34*  CREATININE 1.54*  CALCIUM 10.6*   Liver Function Tests: Recent Labs  Lab 06/01/19 0131  AST 18  ALT 11  ALKPHOS 58  BILITOT 0.6  PROT 6.8  ALBUMIN 3.6   No results for input(s): LIPASE, AMYLASE in the last 168 hours. Recent Labs  Lab 06/01/19 0131  AMMONIA 40*   CBC: Recent Labs  Lab 06/01/19 0131  WBC 7.8  NEUTROABS 5.8  HGB 17.1*  HCT 54.6*  MCV 106.6*  PLT 249   Cardiac Enzymes: No results for input(s): CKTOTAL, CKMB, CKMBINDEX, TROPONINI in the last 168 hours.  BNP (last 3 results) No results for input(s): PROBNP in the last 8760 hours. CBG: No results for input(s): GLUCAP in the last 168 hours.  Radiological Exams on Admission: CT Head Wo Contrast  Result Date: 06/01/2019 CLINICAL  DATA:  Altered mental status. EXAM: CT HEAD WITHOUT CONTRAST TECHNIQUE: Contiguous axial images were obtained from the base of the skull through the vertex without intravenous contrast. COMPARISON:  Report from head CT and brain MRI June 2015 at [facility] os puddle, images not available. FINDINGS: Brain: Generalized atrophy is advanced for age. Ventricular dilatation is slightly out of proportion to sulcal prominence. Encephalomalacia in the right temporal occipital lobe consistent with prior ischemia. Periventricular white matter hypodensity is nonspecific but likely chronic small vessel ischemia. No hemorrhage. No evidence of acute ischemia. No subdural or extra-axial collection. Vascular: No hyperdense vessel or unexpected calcification. Skull: No fracture or focal lesion. Sinuses/Orbits: No acute findings. Mastoid air cells are hypoplastic. Frontal sinuses are hypoplastic. Other: None. IMPRESSION: 1. No acute  intracranial abnormality. 2. Generalized atrophy, advanced for age. Ventricular dilatation is likely secondary to central atrophy, cannot exclude normal pressure hydrocephalus. 3. Encephalomalacia in the right temporal occipital lobe consistent with prior ischemia. Periventricular white matter hypodensity is likely chronic small vessel ischemia. Electronically Signed   By: Keith Rake M.D.   On: 06/01/2019 01:58   DG Chest Port 1 View  Result Date: 06/01/2019 CLINICAL DATA:  Altered mental status. Hypoxia. EXAM: PORTABLE CHEST 1 VIEW COMPARISON:  Radiograph 09/16/2013 FINDINGS: Chronic hyperinflation and bronchial thickening. Post median sternotomy. Heart is normal in size. Atherosclerosis of the thoracic aorta. No pulmonary edema, pleural effusion or pneumothorax. No focal airspace disease. Surgical hardware in the lower cervical spine is partially included. IMPRESSION: Chronic hyperinflation and bronchial thickening. No acute abnormality. Aortic Atherosclerosis (ICD10-I70.0). Electronically Signed    By: Keith Rake M.D.   On: 06/01/2019 01:41    EKG: Personally reviewed.  Normal sinus rhythm  Assessment/Plan Principal Problem:   Acute respiratory failure with hypoxia (HCC) Active Problems:   COPD (chronic obstructive pulmonary disease) (HCC)   COPD with acute exacerbation (HCC)   Current every day smoker   Opioid dependence (HCC)   Depression   Hyperlipidemia   CAD (coronary artery disease)   Elevated troponin   AKI (acute kidney injury) ([place])   Hyperglycemia   1. Acute respiratory failure with hypoxia-suspect secondary to acute COPD exacerbation which is being treated with IV steroids, BiPAP as needed, nebulizer treatments around-the-clock and supplemental oxygen. 2. Chronic tobacco use-when patient is more alert will counsel on smoking cessation.  Will provide nicotine patches as needed for cravings symptoms.\ 3. CAD status post CABG-patient is presenting with mild bump in troponin level.  He has been started on heparin infusion.  2D echocardiogram ordered.  Cardiology is going to see him today. 4. Hyperlipidemia-check fasting lipid panel.  Awaiting med reconciliation.  He likely will need adjustment in statin dose for optimal LDL. 5. Presumed AKI-we do not have any older labs prior to 2018.  He is dehydrated on exam and will provide gentle hydration today. 6. Hyperglycemia-check hemoglobin A1c provide sliding scale coverage as needed. 7. Depression-resume home medications.  Still awaiting for home medications to be reconciled. 8. Altered mentation-patient appears to be poorly compliant with follow-up and medications.  Will order MRI brain to rule out occult acute CVA.   DVT Prophylaxis: heparin  Code Status: full   Family Communication:   Disposition Plan: admit to stepdown ICU on bipap   Critical Care Procedure Note Authorized and Performed by: Murvin Natal MD  Total Critical Care time:  55 mins Due to a high probability of clinically significant, life threatening  deterioration, the patient required my highest level of preparedness to intervene emergently and I personally spent this critical care time directly and personally managing the patient.  This critical care time included obtaining a history; examining the patient, pulse oximetry; ordering and review of studies; arranging urgent treatment with development of a management plan; evaluation of patient's response of treatment; frequent reassessment; and discussions with other providers.  This critical care time was performed to assess and manage the high probability of imminent and life threatening deterioration that could result in multi-organ failure.  It was exclusive of separately billable procedures and treating other patients and teaching time.   Irwin Brakeman, MD [facility] How to contact the [facility] Attending or Consulting provider [place] or covering provider during after hours [place], for this patient?  1. Check the  care team in [facility] and look for a) attending/consulting [place] provider listed and b) the [facility] team listed 2. Log into [email or web address] and use [place] universal password to access. If you do not have the password, please contact the hospital operator. 3. Locate the [facility] provider you are looking for under [facility] and page to a number that you can be directly reached. 4. If you still have difficulty reaching the provider, please page the [facility] (Director on Call) for the Hospitalists listed on amion for assistance.

## 2019-06-01 NOTE — ED Notes (Signed)
Pt unable to void. Said that he does not need to. Urged to try  Given urinal to keep

## 2019-06-01 NOTE — Progress Notes (Signed)
Pt taken off BIPAP per his request. Pt placed on 3L [place] with O2 saturation of 98%

## 2019-06-02 DIAGNOSIS — I248 Other forms of acute ischemic heart disease: Secondary | ICD-10-CM

## 2019-06-02 DIAGNOSIS — Z720 Tobacco use: Secondary | ICD-10-CM

## 2019-06-02 LAB — COMPREHENSIVE METABOLIC PANEL
ALT: 12 U/L (ref 0–44)
AST: 16 U/L (ref 15–41)
Albumin: 3.3 g/dL — ABNORMAL LOW (ref 3.5–5.0)
Alkaline Phosphatase: 45 U/L (ref 38–126)
Anion gap: 10 (ref 5–15)
BUN: 35 mg/dL — ABNORMAL HIGH (ref 6–20)
CO2: 30 mmol/L (ref 22–32)
Calcium: 10.1 mg/dL (ref 8.9–10.3)
Chloride: 96 mmol/L — ABNORMAL LOW (ref 98–111)
Creatinine, Ser: 0.91 mg/dL (ref 0.61–1.24)
GFR calc Af Amer: >60 mL/min (ref >60)
GFR calc non Af Amer: >60 mL/min (ref >60)
Glucose, Bld: 108 mg/dL — ABNORMAL HIGH (ref 70–99)
Potassium: 4.5 mmol/L (ref 3.5–5.1)
Sodium: 136 mmol/L (ref 135–145)
Total Bilirubin: 0.8 mg/dL (ref 0.3–1.2)
Total Protein: 6.1 g/dL — ABNORMAL LOW (ref 6.5–8.1)

## 2019-06-02 LAB — GLUCOSE, CAPILLARY
Glucose-Capillary: 119 mg/dL — ABNORMAL HIGH (ref 70–99)
Glucose-Capillary: 125 mg/dL — ABNORMAL HIGH (ref 70–99)
Glucose-Capillary: 99 mg/dL (ref 70–99)
Glucose-Capillary: 103 mg/dL — ABNORMAL HIGH (ref 70–99)
Glucose-Capillary: 100 mg/dL — ABNORMAL HIGH (ref 70–99)

## 2019-06-02 LAB — CBC WITH DIFFERENTIAL/PLATELET
Abs Immature Granulocytes: 0.03 10*3/uL (ref 0.00–0.07)
Basophils Absolute: 0 10*3/uL (ref 0.0–0.1)
Basophils Relative: 0 %
Eosinophils Absolute: 0 10*3/uL (ref 0.0–0.5)
Eosinophils Relative: 0 %
HCT: 49.2 % (ref 39.0–52.0)
Hemoglobin: 15.8 g/dL (ref 13.0–17.0)
Immature Granulocytes: 0 %
Lymphocytes Relative: 5 %
Lymphs Abs: 0.5 10*3/uL — ABNORMAL LOW (ref 0.7–4.0)
MCH: 33.1 pg (ref 26.0–34.0)
MCHC: 32.1 g/dL (ref 30.0–36.0)
MCV: 103.1 fL — ABNORMAL HIGH (ref 80.0–100.0)
Monocytes Absolute: 0.4 10*3/uL (ref 0.1–1.0)
Monocytes Relative: 4 %
Neutro Abs: 8.7 10*3/uL — ABNORMAL HIGH (ref 1.7–7.7)
Neutrophils Relative %: 91 %
Platelets: 223 10*3/uL (ref 150–400)
RBC: 4.77 MIL/uL (ref 4.22–5.81)
RDW: 16.1 % — ABNORMAL HIGH (ref 11.5–15.5)
WBC: 9.6 10*3/uL (ref 4.0–10.5)
nRBC: 0.2 % (ref 0.0–0.2)

## 2019-06-02 LAB — MRSA PCR SCREENING: MRSA by PCR: NEGATIVE

## 2019-06-02 LAB — MAGNESIUM: Magnesium: 1.8 mg/dL (ref 1.7–2.4)

## 2019-06-02 MED ORDER — OXYCODONE HCL 5 MG PO TABS
5.0000 mg | ORAL_TABLET | Freq: Four times a day (QID) | ORAL | Status: DC | PRN
  Administered 2019-06-03: 5 mg via ORAL
  Filled 2019-06-02 (×2): qty 1

## 2019-06-02 MED ORDER — PANTOPRAZOLE SODIUM 40 MG PO TBEC
40.0000 mg | DELAYED_RELEASE_TABLET | Freq: Every day | ORAL | Status: DC
  Administered 2019-06-02 – 2019-06-03 (×2): 40 mg via ORAL
  Filled 2019-06-02 (×2): qty 1

## 2019-06-02 MED ORDER — DOXYCYCLINE HYCLATE 100 MG PO TABS
100.0000 mg | ORAL_TABLET | Freq: Two times a day (BID) | ORAL | Status: DC
  Administered 2019-06-02 – 2019-06-03 (×3): 100 mg via ORAL
  Filled 2019-06-02 (×3): qty 1

## 2019-06-02 MED ORDER — METHYLPREDNISOLONE SODIUM SUCC 40 MG IJ SOLR
40.0000 mg | Freq: Three times a day (TID) | INTRAMUSCULAR | Status: DC
  Administered 2019-06-02 – 2019-06-03 (×3): 40 mg via INTRAVENOUS
  Filled 2019-06-02 (×3): qty 1

## 2019-06-02 NOTE — Progress Notes (Signed)
PROGRESS NOTE [facility]   Antonio Ramirez  [id number]  DOB: 10/25/1960  DOA: 06/01/2019 PCP: [name], DO  Brief Admission Hx: 59 y.o. male 1 ppd smoker with CAD s/p CABG, COPD, chronic back pain with opioid dependence who presented to ED by EMS when significant other found him with altered mentation lying in the bed reported that he had not been acting his regular self.  He was encephalopathic likely secondary to pain medications as he had been taking high-dose opioids from a pain clinic.  He also was noted to have an acute COPD exacerbation and has been treated as such.  MDM/Assessment & Plan:   1. Acute respiratory failure with hypoxia-improving with acute treatments.  He is being treated for an acute COPD exacerbation with IV steroids.  He has not required further BiPAP therapy and has been moved out of the ICU to the floor.  Continue current management.  He is hopeful to discharge home soon. 2. Tobacco addiction-he has been provided with nicotine patches and counseled on smoking cessation. 3. CAD status post CABG-cardiology saw him and thought his elevated troponin was related to acute respiratory distress.  His 2D echocardiogram did not show any abnormal wall motion and cardiology has signed off. 4. Hyperlipidemia-LDL is 113 which is suboptimally controlled for heart patient.  He has been resumed on his atorvastatin but he is not able to tell me if he is taking it regularly. 5. COPD exacerbation-being treated with doxycycline and treatment noted above. 6. AKI-resolved with gentle hydration.  Creatinine is normalized now. 7. Prediabetes-as evidenced by an A1c of 6.2%.  Managed with diet and exercise.  He will need to follow-up with his PCP for further management. 8. Depression-he has been resumed on his home medications and will follow. 9. Metabolic encephalopathy-likely exacerbated by too much opioid medication which has been held in dose has been reduced  significantly in the hospital.  Unfortunately he likely will resume his home high-dose opioids at discharge but I recommend he review these medications with his pain management specialist.  His mentation has improved to baseline now.  He was unable to get an MRI due to being on oxygen.  DVT prophylaxis: Enoxaparin Code Status: Full Family Communication:  Disposition Plan: From home, on IV steroids and scheduled nebulizer treatments, planning discharge home tomorrow if remains stable  Consultants:    Procedures:    Antimicrobials:     Subjective: Patient reports that he would like to go home.  He has been taking off his nasal cannula oxygen.  He denies shortness of breath and chest pain.  He remains weak.  He is wheezing at times.  Objective: Vitals:   06/02/19 0800 06/02/19 0801 06/02/19 0900 06/02/19 1130  BP: 128/76 121/73  129/81  Pulse: 77 81 74 69  Resp:    18  Temp:    98.3 F (36.8 C)  TempSrc:    Oral  SpO2: (!) 88% 90% 93% 96%  Weight:      Height:        Intake/Output Summary (Last 24 hours) at 06/02/2019 1248 Last data filed at 06/02/2019 0920 Gross per 24 hour  Intake 1305.68 ml  Output 175 ml  Net 1130.68 ml   Filed Weights   06/01/19 0729 06/01/19 0857  Weight: 85.4 kg 77.4 kg     REVIEW OF SYSTEMS  As per history otherwise all reviewed and reported negative  Exam:  General exam: Chronically ill-appearing male lying in the bed he  is awake and alert and oriented x3. Respiratory system: Bibasilar wheeze improved from yesterday no increased work of breathing. Cardiovascular system: S1 & S2 heard. No JVD, murmurs, gallops, clicks or pedal edema. Gastrointestinal system: Abdomen is nondistended, soft and nontender. Normal bowel sounds heard. Central nervous system: Alert and oriented. No focal neurological deficits. Extremities: no CCE.  Data Reviewed: Basic Metabolic Panel: Recent Labs  Lab 06/01/19 0131 06/02/19 0411  NA 133* 136  K 5.1  4.5  CL 96* 96*  CO2 27 30  GLUCOSE 135* 108*  BUN 34* 35*  CREATININE 1.54* 0.91  CALCIUM 10.6* 10.1  MG  --  1.8   Liver Function Tests: Recent Labs  Lab 06/01/19 0131 06/02/19 0411  AST 18 16  ALT 11 12  ALKPHOS 58 45  BILITOT 0.6 0.8  PROT 6.8 6.1*  ALBUMIN 3.6 3.3*   No results for input(s): LIPASE, AMYLASE in the last 168 hours. Recent Labs  Lab 06/01/19 0131  AMMONIA 40*   CBC: Recent Labs  Lab 06/01/19 0131 06/02/19 0411  WBC 7.8 9.6  NEUTROABS 5.8 8.7*  HGB 17.1* 15.8  HCT 54.6* 49.2  MCV 106.6* 103.1*  PLT 249 223   Cardiac Enzymes: No results for input(s): CKTOTAL, CKMB, CKMBINDEX, TROPONINI in the last 168 hours. CBG (last 3)  Recent Labs    06/02/19 0221 06/02/19 0751 06/02/19 1242  GLUCAP 119* 125* 99   Recent Results (from the past 240 hour(s))  Respiratory Panel by RT PCR (Flu A&B, Covid) - Nasopharyngeal Swab     Status: None   Collection Time: 06/01/19  1:56 AM   Specimen: Nasopharyngeal Swab  Result Value Ref Range Status   SARS Coronavirus 2 by RT PCR NEGATIVE NEGATIVE Final    Comment: (NOTE) SARS-CoV-2 target nucleic acids are NOT DETECTED. The SARS-CoV-2 RNA is generally detectable in upper respiratoy specimens during the acute phase of infection. The lowest concentration of SARS-CoV-2 viral copies this assay can detect is 131 copies/mL. A negative result does not preclude SARS-Cov-2 infection and should not be used as the sole basis for treatment or other patient management decisions. A negative result may occur with  improper specimen collection/handling, submission of specimen other than nasopharyngeal swab, presence of viral mutation(s) within the areas targeted by this assay, and inadequate number of viral copies (<131 copies/mL). A negative result must be combined with clinical observations, patient history, and epidemiological information. The expected result is Negative. Fact Sheet for Patients:    PinkCheek.be Fact Sheet for Healthcare Providers:  GravelBags.it This test is not yet ap proved or cleared by the [place] FDA and  has been authorized for detection and/or diagnosis of SARS-CoV-2 by FDA under an Emergency Use Authorization (EUA). This EUA will remain  in effect (meaning this test can be used) for the duration of the COVID-19 declaration under Section 564(b)(1) of the Act, 21 U.S.C. section 360bbb-3(b)(1), unless the authorization is terminated or revoked sooner.    Influenza A by PCR NEGATIVE NEGATIVE Final   Influenza B by PCR NEGATIVE NEGATIVE Final    Comment: (NOTE) The Xpert Xpress SARS-CoV-2/FLU/RSV assay is intended as an aid in  the diagnosis of influenza from Nasopharyngeal swab specimens and  should not be used as a sole basis for treatment. Nasal washings and  aspirates are unacceptable for Xpert Xpress SARS-CoV-2/FLU/RSV  testing. Fact Sheet for Patients: PinkCheek.be Fact Sheet for Healthcare Providers: GravelBags.it This test is not yet approved or cleared by the [place] and  has been authorized for detection and/or diagnosis of SARS-CoV-2 by  FDA under an Emergency Use Authorization (EUA). This EUA will remain  in effect (meaning this test can be used) for the duration of the  Covid-19 declaration under Section 564(b)(1) of the Act, 21  U.S.C. section 360bbb-3(b)(1), unless the authorization is  terminated or revoked. Performed at [facility], [address],  [place], [place] [address]   MRSA PCR Screening     Status: None   Collection Time: 06/01/19  8:59 AM   Specimen: Nasal Mucosa; Nasopharyngeal  Result Value Ref Range Status   MRSA by PCR NEGATIVE NEGATIVE Final    Comment:        The GeneXpert MRSA Assay (FDA approved for NASAL specimens only), is one component of a comprehensive MRSA  colonization surveillance program. It is not intended to diagnose MRSA infection nor to guide or monitor treatment for MRSA infections. Performed at [facility], [address] [place], [place] [address]      Studies: CT Head Wo Contrast  Result Date: 06/01/2019 CLINICAL DATA:  Altered mental status. EXAM: CT HEAD WITHOUT CONTRAST TECHNIQUE: Contiguous axial images were obtained from the base of the skull through the vertex without intravenous contrast. COMPARISON:  Report from head CT and brain MRI June 2015 at [facility] os puddle, images not available. FINDINGS: Brain: Generalized atrophy is advanced for age. Ventricular dilatation is slightly out of proportion to sulcal prominence. Encephalomalacia in the right temporal occipital lobe consistent with prior ischemia. Periventricular white matter hypodensity is nonspecific but likely chronic small vessel ischemia. No hemorrhage. No evidence of acute ischemia. No subdural or extra-axial collection. Vascular: No hyperdense vessel or unexpected calcification. Skull: No fracture or focal lesion. Sinuses/Orbits: No acute findings. Mastoid air cells are hypoplastic. Frontal sinuses are hypoplastic. Other: None. IMPRESSION: 1. No acute intracranial abnormality. 2. Generalized atrophy, advanced for age. Ventricular dilatation is likely secondary to central atrophy, cannot exclude normal pressure hydrocephalus. 3. Encephalomalacia in the right temporal occipital lobe consistent with prior ischemia. Periventricular white matter hypodensity is likely chronic small vessel ischemia. Electronically Signed   By: Keith Rake M.D.   On: 06/01/2019 01:58   DG Chest Port 1 View  Result Date: 06/01/2019 CLINICAL DATA:  Altered mental status. Hypoxia. EXAM: PORTABLE CHEST 1 VIEW COMPARISON:  Radiograph 09/16/2013 FINDINGS: Chronic hyperinflation and bronchial thickening. Post median sternotomy. Heart is normal in size. Atherosclerosis of the thoracic aorta. No  pulmonary edema, pleural effusion or pneumothorax. No focal airspace disease. Surgical hardware in the lower cervical spine is partially included. IMPRESSION: Chronic hyperinflation and bronchial thickening. No acute abnormality. Aortic Atherosclerosis (ICD10-I70.0). Electronically Signed   By: Keith Rake M.D.   On: 06/01/2019 01:41   ECHOCARDIOGRAM COMPLETE  Result Date: 06/01/2019    ECHOCARDIOGRAM REPORT   Patient Name:   Antonio Ramirez Date of Exam: 06/01/2019 Medical Rec #:  [id number]         Height:       72.0 in Accession #:    [id number]        Weight:       170.6 lb Date of Birth:  1961/03/14         BSA:          1.99 m Patient Age:    25 years          BP:           135/89 mmHg Patient Gender: M  HR:           73 bpm. Exam Location:  [facility] Procedure: 2D Echo STAT ECHO Indications:    CAD Native Vessel 414.01 / I25.10  History:        Patient has no prior history of Echocardiogram examinations.                 CAD, COPD; Risk Factors:Current Smoker and Dyslipidemia.                 Elevated Troponin, Opiod Dependence.  Sonographer:    Leavy Cella RDCS (AE) Referring Phys: [place]  1. Left ventricular ejection fraction, by estimation, is 55 to 60%. The left ventricle has normal function. The left ventricle demonstrates regional wall motion abnormalities (see scoring diagram/findings for description). There is mild asymmetric left ventricular hypertrophy of the posterior segment. Left ventricular diastolic parameters were normal. There is septal dysnergy due to conduction delay and the interventricular septum is flattened in systole, consistent with right ventricular pressure overload.  2. Right ventricular systolic function is moderately reduced. The right ventricular size is moderately enlarged. There is normal pulmonary artery systolic pressure.  3. The mitral valve is grossly normal. No evidence of mitral valve regurgitation.  4. The aortic  valve is tricuspid. Aortic valve regurgitation is not visualized. No aortic stenosis is present.  5. The inferior vena cava is dilated in size with >50% respiratory variability, suggesting right atrial pressure of 8 mmHg. FINDINGS  Left Ventricle: Left ventricular ejection fraction, by estimation, is 55 to 60%. The left ventricle has normal function. The left ventricle demonstrates regional wall motion abnormalities. The left ventricular internal cavity size was normal in size. There is mild asymmetric left ventricular hypertrophy of the posterior segment. Septal dysnergy due to conduction delay and the interventricular septum is flattened in systole, consistent with right ventricular pressure overload. Left ventricular diastolic parameters were normal. Normal left ventricular filling pressure. Right Ventricle: The right ventricular size is moderately enlarged. No increase in right ventricular wall thickness. Right ventricular systolic function is moderately reduced. There is normal pulmonary artery systolic pressure. The tricuspid regurgitant velocity is 2.04 m/s, and with an assumed right atrial pressure of 10 mmHg, the estimated right ventricular systolic pressure is 123XX123 mmHg. Left Atrium: Left atrial size was normal in size. Right Atrium: Right atrial size was normal in size. Pericardium: There is no evidence of pericardial effusion. Mitral Valve: The mitral valve is grossly normal. There is mild calcification of the mitral valve leaflet(s). Moderate mitral annular calcification. No evidence of mitral valve regurgitation. Tricuspid Valve: The tricuspid valve is grossly normal. Tricuspid valve regurgitation is mild. Aortic Valve: The aortic valve is tricuspid. . There is mild thickening of the aortic valve. Aortic valve regurgitation is not visualized. No aortic stenosis is present. There is mild thickening of the aortic valve. Pulmonic Valve: The pulmonic valve was not well visualized. Pulmonic valve  regurgitation is not visualized. Aorta: The aortic root is normal in size and structure. Venous: The inferior vena cava is dilated in size with greater than 50% respiratory variability, suggesting right atrial pressure of 8 mmHg. IAS/Shunts: No atrial level shunt detected by color flow Doppler.  LEFT VENTRICLE PLAX 2D LVIDd:         4.18 cm  Diastology LVIDs:         3.78 cm  LV e' lateral:   9.03 cm/s LV PW:         1.23 cm  LV E/e' lateral: 5.6 LV IVS:        0.96 cm  LV e' medial:    9.57 cm/s LVOT diam:     2.10 cm  LV E/e' medial:  5.3 LV SV Index:   8.33 LVOT Area:     3.46 cm  RIGHT VENTRICLE RV S prime:     6.64 cm/s TAPSE (M-mode): 1.5 cm LEFT ATRIUM           Index      RIGHT ATRIUM          Index LA diam:      2.80 cm 1.41 cm/m RA Area:     8.33 cm LA Vol (A4C): 16.6 ml 8.34 ml/m RA Volume:   15.70 ml 7.88 ml/m   AORTA Ao Root diam: 3.20 cm MITRAL VALVE               TRICUSPID VALVE MV Area (PHT): 4.31 cm    TR Peak grad:   16.6 mmHg MV Decel Time: 176 msec    TR Vmax:        204.00 cm/s MV E velocity: 50.90 cm/s MV A velocity: 59.70 cm/s  SHUNTS MV E/A ratio:  0.85        Systemic Diam: 2.10 cm Kate Sable MD Electronically signed by Kate Sable MD Signature Date/Time: 06/01/2019/10:06:47 AM    Final    Scheduled Meds: . aspirin EC  81 mg Oral Daily  . atorvastatin  40 mg Oral q1800  . Chlorhexidine Gluconate Cloth  6 each Topical Daily  . doxycycline  100 mg Oral Q12H  . enoxaparin (LOVENOX) injection  40 mg Subcutaneous Q24H  . insulin aspart  0-9 Units Subcutaneous TID WC  . insulin aspart  4 Units Subcutaneous TID WC  . mouth rinse  15 mL Mouth Rinse BID  . methylPREDNISolone (SOLU-MEDROL) injection  40 mg Intravenous Q8H  . nicotine  21 mg Transdermal Daily  . pantoprazole  40 mg Oral Q0600  . senna-docusate  2 tablet Oral QHS   Continuous Infusions:  Principal Problem:   Acute respiratory failure with hypoxia (HCC) Active Problems:   COPD (chronic obstructive  pulmonary disease) (HCC)   COPD with acute exacerbation (HCC)   Current every day smoker   Opioid dependence (HCC)   Depression   Hyperlipidemia   CAD (coronary artery disease)   Elevated troponin   AKI (acute kidney injury) ([place])   Hyperglycemia   Tobacco abuse   Demand ischemia of myocardium (HCC)   Hyperlipidemia LDL goal <70  Time spent:   Irwin Brakeman, MD [facility] 06/02/2019, 12:48 PM    LOS: 1 day  How to contact the [facility] Acute Care Attending or Consulting provider [place] or covering provider during after hours [place], for this patient?  1. Check the care team in [facility] and look for a) attending/consulting TR[facility] provider listed and b) the [facility] team listed 2. Log into [email or web address] and use [place]'s universal password to access. If you do not have the password, please contact the hospital operator. 3. Locate the [facility] provider you are looking for under [facility] and page to a number that you can be directly reached. 4. If you still have difficulty reaching the provider, please page the [facility] (Director on Call) for the Hospitalists listed on amion for assistance.

## 2019-06-02 NOTE — Evaluation (Signed)
Physical Therapy Evaluation Patient Details Name: Antonio Ramirez MRN: [id number] DOB: Dec 02, 1960 Today's Date: 06/02/2019   History of Present Illness  Antonio Ramirez is a 59 y.o. male 1 ppd smoker with CAD s/p CABG, COPD, chronic back pain with opioid dependence who presented to ED by EMS when significant other found him with altered mentation lying in the bed reported that he had not been acting his regular self.  The patient was confused and had poor recall.  According to EMS records there were several oxycodone tablets on the patient's bed but the bottle was mostly full.  He never received naloxone according to records review.    Clinical Impression  Patient demonstrates slightly labored movement for sitting up at bedside, good return for transferring to commode in bathroom, has to lean over walker due to fatigue/weakness and limited for ambulation due to c/o fatigue.  Patient tolerated sitting up in chair after therapy - RN notified.  Patient will benefit from continued physical therapy in hospital and recommended venue below to increase strength, balance, endurance for safe ADLs and gait.     Follow Up Recommendations Home health PT;Supervision for mobility/OOB;Supervision - Intermittent    Equipment Recommendations  None recommended by PT    Recommendations for Other Services       Precautions / Restrictions Precautions Precautions: Fall Restrictions Weight Bearing Restrictions: No      Mobility  Bed Mobility Overal bed mobility: Modified Independent             General bed mobility comments: increased time, used bed rail, head of bed partially raised  Transfers Overall transfer level: Needs assistance Equipment used: Rolling walker (2 wheeled) Transfers: Sit to/from [facility] Sit to Stand: Min guard;Min assist Stand pivot transfers: Min guard;Min assist       General transfer comment: slow labored movement with leaning over  RW  Ambulation/Gait Ambulation/Gait assistance: Min assist Gait Distance (Feet): 15 Feet Assistive device: Rolling walker (2 wheeled) Gait Pattern/deviations: Decreased step length - right;Decreased step length - left;Decreased stride length Gait velocity: decreased   General Gait Details: limited to ambulation in room demonstrating slow labored cadence having to lean over RW due to weakness and fatigues easily  Stairs            Wheelchair Mobility    Modified Rankin (Stroke Patients Only)       Balance Overall balance assessment: Needs assistance Sitting-balance support: Feet supported;No upper extremity supported Sitting balance-Leahy Scale: Fair Sitting balance - Comments: fair/good seated at EOB   Standing balance support: During functional activity;Bilateral upper extremity supported Standing balance-Leahy Scale: Fair Standing balance comment: using RW                             Pertinent Vitals/Pain Pain Assessment: No/denies pain    Home Living Family/patient expects to be discharged to:: Private residence Living Arrangements: Spouse/significant other(girlfriend) Available Help at Discharge: Friend(s);Available 24 hours/day Type of Home: House Home Access: Ramped entrance     Home Layout: One level Home Equipment: Walker - 2 wheels;Wheelchair - manual;Shower seat;Bedside commode;Hospital bed      Prior Function                 Hand Dominance        Extremity/Trunk Assessment   Upper Extremity Assessment Upper Extremity Assessment: Generalized weakness    Lower Extremity Assessment Lower Extremity Assessment: Generalized weakness    Cervical /  Trunk Assessment Cervical / Trunk Assessment: Kyphotic  Communication      Cognition Arousal/Alertness: Awake/alert Behavior During Therapy: WFL for tasks assessed/performed Overall Cognitive Status: Within Functional Limits for tasks assessed                                         General Comments      Exercises     Assessment/Plan    PT Assessment Patient needs continued PT services  PT Problem List Decreased strength;Decreased activity tolerance;Decreased balance;Decreased mobility       PT Treatment Interventions Balance training;Gait training;Functional mobility training;Therapeutic activities;Therapeutic exercise;Patient/family education    PT Goals (Current goals can be found in the Care Plan section)  Acute Rehab PT Goals Patient Stated Goal: return home with fiancee to assit PT Goal Formulation: With patient Time For Goal Achievement: 06/05/19 Potential to Achieve Goals: Good    Frequency Min 3X/week   Barriers to discharge        Co-evaluation               AM-PAC PT "6 Clicks" Mobility  Outcome Measure Help needed turning from your back to your side while in a flat bed without using bedrails?: None Help needed moving from lying on your back to sitting on the side of a flat bed without using bedrails?: A Little Help needed moving to and from a bed to a chair (including a wheelchair)?: A Little Help needed standing up from a chair using your arms (e.g., wheelchair or bedside chair)?: A Little Help needed to walk in hospital room?: A Little Help needed climbing 3-5 steps with a railing? : A Lot 6 Click Score: 18    End of Session   Activity Tolerance: Patient tolerated treatment well;Patient limited by fatigue Patient left: in bed;with call bell/phone within reach Nurse Communication: Mobility status PT Visit Diagnosis: Unsteadiness on feet (R26.81);Other abnormalities of gait and mobility (R26.89);Muscle weakness (generalized) (M62.81)    Time: [id number] PT Time Calculation (min) (ACUTE ONLY): 27 min   Charges:   PT Evaluation $PT Eval Moderate Complexity: 1 Mod PT Treatments $Therapeutic Activity: 23-37 mins        12:13 PM, 06/02/19 Lonell Grandchild, MPT Physical Therapist with [facility] 336 [phone] office [phone] mobile phone

## 2019-06-02 NOTE — TOC Initial Note (Signed)
Transition of Care [facility]) - Initial/Assessment Note    Patient Details  Name: Antonio Ramirez MRN: [id number] Date of Birth: 08-15-1960  Transition of Care [facility]) CM/SW Contact:    Aylin Rhoads, Chauncey Reading, RN Phone Number: 06/02/2019, 4:18 PM  Clinical Narrative:    Recommended for home health PT. Agreeable. No preference of providers. [facility] unable to accept referral.  Referral accepted by [facility].            Patient Goals and CMS Choice Patient states their goals for this hospitalization and ongoing recovery are:: return home CMS Medicare.gov Compare Post Acute Care list provided to:: Patient    Expected Discharge Plan and Services     Discharge Planning Services: CM Consult                               HH Arranged: PT [place] Agency: [place] Date [place]: 06/02/19 Time [place]: [id number] Representative spoke with at [place]: Georgina Snell  Prior Living Arrangements/Services                       Activities of Daily Living Home Assistive Devices/Equipment: None ADL Screening (condition at time of admission) Patient's cognitive ability adequate to safely complete daily activities?: Yes Is the patient deaf or have difficulty hearing?: No Does the patient have difficulty seeing, even when wearing glasses/contacts?: No Does the patient have difficulty concentrating, remembering, or making decisions?: Yes Patient able to express need for assistance with ADLs?: Yes Does the patient have difficulty dressing or bathing?: No Independently performs ADLs?: Yes (appropriate for developmental age) Does the patient have difficulty walking or climbing stairs?: No Weakness of Legs: None Weakness of Arms/Hands: None          Admission diagnosis:  Somnolence [R40.0] COPD exacerbation ([place]) [J44.1] Troponin level elevated [R77.8] Acute respiratory failure with hypoxia ([place]) [J96.01] Acute respiratory failure with hypoxia and  hypercapnia ([place]) [J96.01, J96.02] Patient Active Problem List   Diagnosis Date Noted  . Acute respiratory failure with hypoxia ([place]) 06/01/2019  . COPD (chronic obstructive pulmonary disease) ([place])   . COPD with acute exacerbation ([place])   . Current every day smoker   . Opioid dependence ([place])   . Depression   . Hyperlipidemia   . CAD (coronary artery disease)   . Elevated troponin   . AKI (acute kidney injury) ([place])   . Hyperglycemia   . Tobacco abuse   . Demand ischemia of myocardium ([place])   . Hyperlipidemia LDL goal <70    PCP:  [name], DO Pharmacy:   [place], [address] [place] [address] Phone: [phone] Fax: [phone]     Social Determinants of Health (SDOH) Interventions    Readmission Risk Interventions No flowsheet data found.

## 2019-06-02 NOTE — Plan of Care (Signed)
  Problem: Acute Rehab PT Goals(only PT should resolve) Goal: Pt Will Go Supine/Side To Sit Outcome: Progressing Flowsheets (Taken 06/02/2019 1215) Pt will go Supine/Side to Sit:  with modified independence  Independently Goal: Patient Will Transfer Sit To/From Stand Outcome: Progressing Flowsheets (Taken 06/02/2019 1215) Patient will transfer sit to/from stand:  with supervision  with min guard assist Goal: Pt Will Transfer Bed To Chair/Chair To Bed Outcome: Progressing Flowsheets (Taken 06/02/2019 1215) Pt will Transfer Bed to Chair/Chair to Bed: min guard assist Goal: Pt Will Ambulate Outcome: Progressing Flowsheets (Taken 06/02/2019 1215) Pt will Ambulate:  25 feet  with min guard assist  with rolling walker   12:15 PM, 06/02/19 Lonell Grandchild, MPT Physical Therapist with [facility] [phone] office [phone] mobile phone

## 2019-06-03 LAB — COMPREHENSIVE METABOLIC PANEL
ALT: 16 U/L (ref 0–44)
AST: 29 U/L (ref 15–41)
Albumin: 3.6 g/dL (ref 3.5–5.0)
Alkaline Phosphatase: 48 U/L (ref 38–126)
Anion gap: 9 (ref 5–15)
BUN: 33 mg/dL — ABNORMAL HIGH (ref 6–20)
CO2: 32 mmol/L (ref 22–32)
Calcium: 10.7 mg/dL — ABNORMAL HIGH (ref 8.9–10.3)
Chloride: 98 mmol/L (ref 98–111)
Creatinine, Ser: 0.89 mg/dL (ref 0.61–1.24)
GFR calc Af Amer: >60 mL/min (ref >60)
GFR calc non Af Amer: >60 mL/min (ref >60)
Glucose, Bld: 93 mg/dL (ref 70–99)
Potassium: 4.6 mmol/L (ref 3.5–5.1)
Sodium: 139 mmol/L (ref 135–145)
Total Bilirubin: 1 mg/dL (ref 0.3–1.2)
Total Protein: 6.7 g/dL (ref 6.5–8.1)

## 2019-06-03 LAB — CBC WITH DIFFERENTIAL/PLATELET
Abs Immature Granulocytes: 0.04 10*3/uL (ref 0.00–0.07)
Basophils Absolute: 0 10*3/uL (ref 0.0–0.1)
Basophils Relative: 0 %
Eosinophils Absolute: 0 10*3/uL (ref 0.0–0.5)
Eosinophils Relative: 0 %
HCT: 52.6 % — ABNORMAL HIGH (ref 39.0–52.0)
Hemoglobin: 17.1 g/dL — ABNORMAL HIGH (ref 13.0–17.0)
Immature Granulocytes: 0 %
Lymphocytes Relative: 5 %
Lymphs Abs: 0.6 10*3/uL — ABNORMAL LOW (ref 0.7–4.0)
MCH: 33 pg (ref 26.0–34.0)
MCHC: 32.5 g/dL (ref 30.0–36.0)
MCV: 101.5 fL — ABNORMAL HIGH (ref 80.0–100.0)
Monocytes Absolute: 1 10*3/uL (ref 0.1–1.0)
Monocytes Relative: 9 %
Neutro Abs: 9.8 10*3/uL — ABNORMAL HIGH (ref 1.7–7.7)
Neutrophils Relative %: 86 %
Platelets: 256 10*3/uL (ref 150–400)
RBC: 5.18 MIL/uL (ref 4.22–5.81)
RDW: 16 % — ABNORMAL HIGH (ref 11.5–15.5)
WBC: 11.5 10*3/uL — ABNORMAL HIGH (ref 4.0–10.5)
nRBC: 0 % (ref 0.0–0.2)

## 2019-06-03 LAB — GLUCOSE, CAPILLARY: Glucose-Capillary: 87 mg/dL (ref 70–99)

## 2019-06-03 LAB — MAGNESIUM: Magnesium: 2 mg/dL (ref 1.7–2.4)

## 2019-06-03 MED ORDER — ATORVASTATIN CALCIUM 40 MG PO TABS: 40.0000 mg | ORAL_TABLET | Freq: Every day | ORAL | 1 refills | Status: DC

## 2019-06-03 MED ORDER — SENNOSIDES-DOCUSATE SODIUM 8.6-50 MG PO TABS: 2.0000 | ORAL_TABLET | Freq: Every evening | ORAL | Status: DC | PRN

## 2019-06-03 MED ORDER — ALBUTEROL SULFATE HFA 108 (90 BASE) MCG/ACT IN AERS: 1.0000 | INHALATION_SPRAY | Freq: Four times a day (QID) | RESPIRATORY_TRACT | 1 refills | Status: DC | PRN

## 2019-06-03 MED ORDER — PREDNISONE 20 MG PO TABS: ORAL_TABLET | ORAL | 0 refills | Status: DC

## 2019-06-03 MED ORDER — DOXYCYCLINE HYCLATE 100 MG PO TABS: 100.0000 mg | ORAL_TABLET | Freq: Two times a day (BID) | ORAL | 0 refills | Status: AC

## 2019-06-03 MED ORDER — ASPIRIN EC 81 MG PO TBEC: 81.0000 mg | DELAYED_RELEASE_TABLET | Freq: Every day | ORAL | Status: DC

## 2019-06-03 NOTE — Discharge Instructions (Signed)
Chronic Obstructive Pulmonary Disease Exacerbation Chronic obstructive pulmonary disease (COPD) is a long-term (chronic) lung problem. In COPD, the flow of air from the lungs is limited. COPD exacerbations are times that breathing gets worse and you need more than your normal treatment. Without treatment, they can be life threatening. If they happen often, your lungs can become more damaged. If your COPD gets worse, your doctor may treat you with:  Medicines.  Oxygen.  Different ways to clear your airway, such as using a mask. Follow these instructions at home: Medicines  Take over-the-counter and prescription medicines only as told by your doctor.  If you take an antibiotic or steroid medicine, do not stop taking the medicine even if you start to feel better.  Keep up with shots (vaccinations) as told by your doctor. Be sure to get a yearly (annual) flu shot. Lifestyle  Do not smoke. If you need help quitting, ask your doctor.  Eat healthy foods.  Exercise regularly.  Get plenty of sleep.  Avoid tobacco smoke and other things that can bother your lungs.  Wash your hands often with soap and water. This will help keep you from getting an infection. If you cannot use soap and water, use hand sanitizer.  During flu season, avoid areas that are crowded with people. General instructions  Drink enough fluid to keep your pee (urine) clear or pale yellow. Do not do this if your doctor has told you not to.  Use a cool mist machine (vaporizer).  If you use oxygen or a machine that turns medicine into a mist (nebulizer), continue to use it as told.  Follow all instructions for rehabilitation. These are steps you can take to make your body work better.  Keep all follow-up visits as told by your doctor. This is important. Contact a doctor if:  Your COPD symptoms get worse than normal. Get help right away if:  You are short of breath and it gets worse.  You have trouble  talking.  You have chest pain.  You cough up blood.  You have a fever.  You keep throwing up (vomiting).  You feel weak or you pass out (faint).  You feel confused.  You are not able to sleep because of your symptoms.  You are not able to do daily activities. Summary  COPD exacerbations are times that breathing gets worse and you need more treatment than normal.  COPD exacerbations can be very serious and may cause your lungs to become more damaged.  Do not smoke. If you need help quitting, ask your doctor.  Stay up-to-date on your shots. Get a flu shot every year. This information is not intended to replace advice given to you by your health care provider. Make sure you discuss any questions you have with your health care provider. Document Revised: 03/13/2017 Document Reviewed: 05/05/2016 Elsevier Patient Education  2020 [place].      IMPORTANT INFORMATION: PAY CLOSE ATTENTION   PHYSICIAN DISCHARGE INSTRUCTIONS  Follow with Primary care provider  [name], DO  and other consultants as instructed by your Hospitalist Physician  [place] IF SYMPTOMS COME BACK, WORSEN OR NEW PROBLEM DEVELOPS   Please note: You were cared for by a hospitalist during your hospital stay. Every effort will be made to forward records to your primary care provider.  You can request that your primary care provider send for your hospital records if they have not received them.  Once you  are discharged, your primary care physician will handle any further medical issues. Please note that NO REFILLS for any discharge medications will be authorized once you are discharged, as it is imperative that you return to your primary care physician (or establish a relationship with a primary care physician if you do not have one) for your post hospital discharge needs so that they can reassess your need for medications and monitor your lab values.  Please get a  complete blood count and chemistry panel checked by your Primary MD at your next visit, and again as instructed by your Primary MD.  Get Medicines reviewed and adjusted: Please take all your medications with you for your next visit with your Primary MD  Laboratory/radiological data: Please request your Primary MD to go over all hospital tests and procedure/radiological results at the follow up, please ask your primary care provider to get all Hospital records sent to his/her office.  In some cases, they will be blood work, cultures and biopsy results pending at the time of your discharge. Please request that your primary care provider follow up on these results.  If you are diabetic, please bring your blood sugar readings with you to your follow up appointment with primary care.    Please call and make your follow up appointments as soon as possible.    Also Note the following: If you experience worsening of your admission symptoms, develop shortness of breath, life threatening emergency, suicidal or homicidal thoughts you must seek medical attention immediately by calling 911 or calling your MD immediately  if symptoms less severe.  You must read complete instructions/literature along with all the possible adverse reactions/side effects for all the Medicines you take and that have been prescribed to you. Take any new Medicines after you have completely understood and accpet all the possible adverse reactions/side effects.   Do not drive when taking Pain medications or sleeping medications (Benzodiazepines)  Do not take more than prescribed Pain, Sleep and Anxiety Medications. It is not advisable to combine anxiety,sleep and pain medications without talking with your primary care practitioner  Special Instructions: If you have smoked or chewed Tobacco  in the last 2 yrs please stop smoking, stop any regular Alcohol  and or any Recreational drug use.  Wear Seat belts while driving.  Do not  drive if taking any narcotic, mind altering or controlled substances or recreational drugs or alcohol.

## 2019-06-03 NOTE — Discharge Summary (Signed)
Physician Discharge Summary  Antonio Ramirez [id number] DOB: Jun 06, 1960 DOA: 06/01/2019  PCP: [name], DO Cardiology: Karalee Height  Admit date: 06/01/2019 Discharge date: 06/03/2019  Admitted From:  Home  Disposition: Home   Recommendations for Outpatient Follow-up:  1. Follow up with PCP in 1 weeks 2. Follow up with cardiology as scheduled 06/22/19.  3. Please work on further smoking cessation counseling.  4. Please work on reducing opioid usage.   Home Health:  PT  Discharge Condition: STABLE   CODE STATUS: FULL    Brief Hospitalization Summary: Please see all hospital notes, images, labs for full details of the hospitalization. HPI: Antonio Ramirez is a 59 y.o. male 1 ppd smoker with CAD s/p CABG, COPD, chronic back pain with opioid dependence who presented to ED by EMS when significant other found him with altered mentation lying in the bed reported that he had not been acting his regular self.  The patient was confused and had poor recall.  According to EMS records there were several oxycodone tablets on the patient's bed but the bottle was mostly full.  He never received naloxone according to records review.  The patient was evaluated in the emergency department and noted to be hypoxic with a pulse ox in the low 80% range.  He was placed on 4 L nasal cannula with improvement to 96%.  His ABG was concerning with an elevated PCO2 of 64.  The patient was briefly placed on BiPAP therapy.  His neurological exam was nonfocal.  He was started on treatment for COPD exacerbation with IV steroids and nebulizer treatments.  His troponin was initially elevated at 822.  He has had poor cardiology follow up since CABG in 2006.  Repeat high-sensitivity troponin was 92.  LDL was 113.  Lactic acid 1.3.  Glucose 151.  Sodium 133, potassium 5.1, creatinine 1.54, BUN 34, calcium 10.6, WBC 7.8, hemoglobin 17.1, platelet count 249.  SARS 2 coronavirus test negative.  Urine drug  screen positive for opioids and marijuana.  Salicylate level less than 7.0.  CT brain no acute findings.  ED doctor consulted to cardiology regarding the elevated troponins and patient was started on IV heparin infusion.  Admission was requested.    Brief Admission Hx: 59 y.o.male1 ppd smoker with CAD s/p CABG, COPD, chronic back pain with opioid dependence who presented to ED by EMS when significant other found himwith altered mentationlying in the bedreported that he had not been acting his regular self.  He was encephalopathic likely secondary to pain medications as he had been taking high-dose opioids from a pain clinic.  He also was noted to have an acute COPD exacerbation and has been treated as such.  MDM/Assessment & Plan:   1. Acute respiratory failure with hypoxia-improved with acute treatments.  He was treated for an acute COPD exacerbation with IV steroids and antibiotics.  He has not required further BiPAP therapy and has been moved out of the ICU to the floor.  He is off oxygen and ambulating.  He is requesting to discharge home.  2. Tobacco addiction-he was provided with nicotine patches and counseled on smoking cessation. 3. CAD status post CABG-cardiology saw him and thought his elevated troponin was related to acute respiratory distress.  His 2D echocardiogram did not show any abnormal wall motion and cardiology has signed off.  See consult note.  4. Hyperlipidemia-LDL is 113 which is suboptimally controlled for heart patient.  He has started on 40 mg atorvastatin.  5.  COPD exacerbation-being treated with doxycycline and treatment noted above. 6. AKI-resolved with gentle hydration.  Creatinine is normalized now. 7. Prediabetes-as evidenced by an A1c of 6.2%.  Managed with diet and exercise.  He will need to follow-up with his PCP for further management. 8. Depression-he has been resumed on his home medications and will follow. 9. Metabolic encephalopathy-RESOLVED.  likely  exacerbated by too much opioid medication which has been held in dose has been reduced significantly in the hospital.  Unfortunately he likely will resume his home high-dose opioids at discharge but I recommend he review these medications with his pain management specialist.  His mentation has improved to baseline now.  He was unable to get an MRI due to being on oxygen. 10. Opioid dependence - We did not resume the high dose opioids that he had been taking when he was in hospital and patient did very well, remained alert, no complaints, ambulated, I feel he should not be restarted on these long acting high dose opioids as it makes him very high risk for aspiration and other adverse events.  He was prescribed oxycodone 5 mg every 6 hours PRN in hospital and he only requested very rare periodic doses.  Follow up with PCP and pain management.    DVT prophylaxis: Enoxaparin Code Status: Full Family Communication:  Disposition Plan: Home with [facility]   Discharge Diagnoses:  Principal Problem:   Acute respiratory failure with hypoxia ([place]) Active Problems:   COPD (chronic obstructive pulmonary disease) (HCC)   COPD with acute exacerbation (HCC)   Current every day smoker   Opioid dependence (HCC)   Depression   Hyperlipidemia   CAD (coronary artery disease)   Elevated troponin   AKI (acute kidney injury) ([place])   Hyperglycemia   Tobacco abuse   Demand ischemia of myocardium (HCC)   Hyperlipidemia LDL goal <70  Discharge Instructions:  Allergies as of 06/03/2019      Reactions   Opana [oxymorphone Hcl] Hives   hiv      Medication List    STOP taking these medications   clopidogrel 75 MG tablet Commonly known as: PLAVIX   Xtampza ER 36 MG C12a Generic drug: oxyCODONE ER     TAKE these medications   albuterol 108 (90 Base) MCG/ACT inhaler Commonly known as: VENTOLIN HFA Inhale 1-2 puffs into the lungs every 6 (six) hours as needed for wheezing or shortness of breath.   Amitiza  24 MCG capsule Generic drug: lubiprostone Take 24 mcg by mouth 2 (two) times daily with a meal.   aspirin EC 81 MG tablet Take 1 tablet (81 mg total) by mouth daily. What changed:   medication strength  how much to take   atorvastatin 40 MG tablet Commonly known as: LIPITOR Take 1 tablet (40 mg total) by mouth daily at 6 PM.   calcitRIOL 0.5 MCG capsule Commonly known as: ROCALTROL Take 0.5 mcg by mouth daily.   citalopram 10 MG tablet Commonly known as: CELEXA Take 10 tablets by mouth daily.   doxycycline 100 MG tablet Commonly known as: VIBRA-TABS Take 1 tablet (100 mg total) by mouth every 12 (twelve) hours for 2 days.   gabapentin 600 MG tablet Commonly known as: NEURONTIN Take 600 mg by mouth at bedtime.   isosorbide mononitrate 30 MG 24 hr tablet Commonly known as: IMDUR Take 30 mg by mouth daily.   omeprazole 20 MG capsule Commonly known as: PRILOSEC Take 20 mg by mouth daily before breakfast. 30 min prior  to breakfast.   oxyCODONE 15 MG immediate release tablet Commonly known as: ROXICODONE Take 15 mg by mouth 3 (three) times daily as needed for pain. What changed: Another medication with the same name was removed. Continue taking this medication, and follow the directions you see here.   polyethylene glycol 17 g packet Commonly known as: MIRALAX / GLYCOLAX Take 17 g by mouth daily. What changed:   when to take this  reasons to take this   predniSONE 20 MG tablet Commonly known as: DELTASONE Take 2 PO QAM x5days Start taking on: June 04, 2019   senna-docusate 8.6-50 MG tablet Commonly known as: Senokot-S Take 2 tablets by mouth at bedtime as needed for mild constipation.   Symbicort 160-4.5 MCG/ACT inhaler Generic drug: budesonide-formoterol Inhale 2 puffs into the lungs 2 (two) times daily.      Follow-up Information    Erma Heritage, PA-C Follow up on 06/22/2019.   Specialties: Physician Assistant, Cardiology Why: Cardiology  Hospital Follow-up on 06/22/2019 at 2:30PM.  Contact information: [place] [address] [phone]        Octavio Graves P, DO. Schedule an appointment as soon as possible for a visit in 1 week(s).   Contact information: [address] [place]  [address] [phone]          Allergies  Allergen Reactions  . Opana [Oxymorphone Hcl] Hives    hiv   Allergies as of 06/03/2019      Reactions   Opana [oxymorphone Hcl] Hives   hiv      Medication List    STOP taking these medications   clopidogrel 75 MG tablet Commonly known as: PLAVIX   Xtampza ER 36 MG C12a Generic drug: oxyCODONE ER     TAKE these medications   albuterol 108 (90 Base) MCG/ACT inhaler Commonly known as: VENTOLIN HFA Inhale 1-2 puffs into the lungs every 6 (six) hours as needed for wheezing or shortness of breath.   Amitiza 24 MCG capsule Generic drug: lubiprostone Take 24 mcg by mouth 2 (two) times daily with a meal.   aspirin EC 81 MG tablet Take 1 tablet (81 mg total) by mouth daily. What changed:   medication strength  how much to take   atorvastatin 40 MG tablet Commonly known as: LIPITOR Take 1 tablet (40 mg total) by mouth daily at 6 PM.   calcitRIOL 0.5 MCG capsule Commonly known as: ROCALTROL Take 0.5 mcg by mouth daily.   citalopram 10 MG tablet Commonly known as: CELEXA Take 10 tablets by mouth daily.   doxycycline 100 MG tablet Commonly known as: VIBRA-TABS Take 1 tablet (100 mg total) by mouth every 12 (twelve) hours for 2 days.   gabapentin 600 MG tablet Commonly known as: NEURONTIN Take 600 mg by mouth at bedtime.   isosorbide mononitrate 30 MG 24 hr tablet Commonly known as: IMDUR Take 30 mg by mouth daily.   omeprazole 20 MG capsule Commonly known as: PRILOSEC Take 20 mg by mouth daily before breakfast. 30 min prior to breakfast.   oxyCODONE 15 MG immediate release tablet Commonly known as: ROXICODONE Take 15 mg by mouth 3 (three)  times daily as needed for pain. What changed: Another medication with the same name was removed. Continue taking this medication, and follow the directions you see here.   polyethylene glycol 17 g packet Commonly known as: MIRALAX / GLYCOLAX Take 17 g by mouth daily. What changed:   when to take  this  reasons to take this   predniSONE 20 MG tablet Commonly known as: DELTASONE Take 2 PO QAM x5days Start taking on: June 04, 2019   senna-docusate 8.6-50 MG tablet Commonly known as: Senokot-S Take 2 tablets by mouth at bedtime as needed for mild constipation.   Symbicort 160-4.5 MCG/ACT inhaler Generic drug: budesonide-formoterol Inhale 2 puffs into the lungs 2 (two) times daily.       Procedures/Studies: CT Head Wo Contrast  Result Date: 06/01/2019 CLINICAL DATA:  Altered mental status. EXAM: CT HEAD WITHOUT CONTRAST TECHNIQUE: Contiguous axial images were obtained from the base of the skull through the vertex without intravenous contrast. COMPARISON:  Report from head CT and brain MRI June 2015 at [facility] [place], images not available. FINDINGS: Brain: Generalized atrophy is advanced for age. Ventricular dilatation is slightly out of proportion to sulcal prominence. Encephalomalacia in the right temporal occipital lobe consistent with prior ischemia. Periventricular white matter hypodensity is nonspecific but likely chronic small vessel ischemia. No hemorrhage. No evidence of acute ischemia. No subdural or extra-axial collection. Vascular: No hyperdense vessel or unexpected calcification. Skull: No fracture or focal lesion. Sinuses/Orbits: No acute findings. Mastoid air cells are hypoplastic. Frontal sinuses are hypoplastic. Other: None. IMPRESSION: 1. No acute intracranial abnormality. 2. Generalized atrophy, advanced for age. Ventricular dilatation is likely secondary to central atrophy, cannot exclude normal pressure hydrocephalus. 3. Encephalomalacia in the right temporal  occipital lobe consistent with prior ischemia. Periventricular white matter hypodensity is likely chronic small vessel ischemia. Electronically Signed   By: Keith Rake M.D.   On: 06/01/2019 01:58   DG Chest Port 1 View  Result Date: 06/01/2019 CLINICAL DATA:  Altered mental status. Hypoxia. EXAM: PORTABLE CHEST 1 VIEW COMPARISON:  Radiograph 09/16/2013 FINDINGS: Chronic hyperinflation and bronchial thickening. Post median sternotomy. Heart is normal in size. Atherosclerosis of the thoracic aorta. No pulmonary edema, pleural effusion or pneumothorax. No focal airspace disease. Surgical hardware in the lower cervical spine is partially included. IMPRESSION: Chronic hyperinflation and bronchial thickening. No acute abnormality. Aortic Atherosclerosis (ICD10-I70.0). Electronically Signed   By: Keith Rake M.D.   On: 06/01/2019 01:41   ECHOCARDIOGRAM COMPLETE  Result Date: 06/01/2019    ECHOCARDIOGRAM REPORT   Patient Name:   Antonio Ramirez Date of Exam: 06/01/2019 Medical Rec #:  [id number]         Height:       72.0 in Accession #:    [id number]        Weight:       170.6 lb Date of Birth:  Aug 21, 1960         BSA:          1.99 m Patient Age:    57 years          BP:           135/89 mmHg Patient Gender: M                 HR:           73 bpm. Exam Location:  [facility] Procedure: 2D Echo STAT ECHO Indications:    CAD Native Vessel 414.01 / I25.10  History:        Patient has no prior history of Echocardiogram examinations.                 CAD, COPD; Risk Factors:Current Smoker and Dyslipidemia.                 Elevated Troponin,  Opiod Dependence.  Sonographer:    Leavy Cella RDCS (AE) Referring Phys: [place]  1. Left ventricular ejection fraction, by estimation, is 55 to 60%. The left ventricle has normal function. The left ventricle demonstrates regional wall motion abnormalities (see scoring diagram/findings for description). There is mild asymmetric left  ventricular hypertrophy of the posterior segment. Left ventricular diastolic parameters were normal. There is septal dysnergy due to conduction delay and the interventricular septum is flattened in systole, consistent with right ventricular pressure overload.  2. Right ventricular systolic function is moderately reduced. The right ventricular size is moderately enlarged. There is normal pulmonary artery systolic pressure.  3. The mitral valve is grossly normal. No evidence of mitral valve regurgitation.  4. The aortic valve is tricuspid. Aortic valve regurgitation is not visualized. No aortic stenosis is present.  5. The inferior vena cava is dilated in size with >50% respiratory variability, suggesting right atrial pressure of 8 mmHg. FINDINGS  Left Ventricle: Left ventricular ejection fraction, by estimation, is 55 to 60%. The left ventricle has normal function. The left ventricle demonstrates regional wall motion abnormalities. The left ventricular internal cavity size was normal in size. There is mild asymmetric left ventricular hypertrophy of the posterior segment. Septal dysnergy due to conduction delay and the interventricular septum is flattened in systole, consistent with right ventricular pressure overload. Left ventricular diastolic parameters were normal. Normal left ventricular filling pressure. Right Ventricle: The right ventricular size is moderately enlarged. No increase in right ventricular wall thickness. Right ventricular systolic function is moderately reduced. There is normal pulmonary artery systolic pressure. The tricuspid regurgitant velocity is 2.04 m/s, and with an assumed right atrial pressure of 10 mmHg, the estimated right ventricular systolic pressure is 123XX123 mmHg. Left Atrium: Left atrial size was normal in size. Right Atrium: Right atrial size was normal in size. Pericardium: There is no evidence of pericardial effusion. Mitral Valve: The mitral valve is grossly normal. There is mild  calcification of the mitral valve leaflet(s). Moderate mitral annular calcification. No evidence of mitral valve regurgitation. Tricuspid Valve: The tricuspid valve is grossly normal. Tricuspid valve regurgitation is mild. Aortic Valve: The aortic valve is tricuspid. . There is mild thickening of the aortic valve. Aortic valve regurgitation is not visualized. No aortic stenosis is present. There is mild thickening of the aortic valve. Pulmonic Valve: The pulmonic valve was not well visualized. Pulmonic valve regurgitation is not visualized. Aorta: The aortic root is normal in size and structure. Venous: The inferior vena cava is dilated in size with greater than 50% respiratory variability, suggesting right atrial pressure of 8 mmHg. IAS/Shunts: No atrial level shunt detected by color flow Doppler.  LEFT VENTRICLE PLAX 2D LVIDd:         4.18 cm  Diastology LVIDs:         3.78 cm  LV e' lateral:   9.03 cm/s LV PW:         1.23 cm  LV E/e' lateral: 5.6 LV IVS:        0.96 cm  LV e' medial:    9.57 cm/s LVOT diam:     2.10 cm  LV E/e' medial:  5.3 LV SV Index:   8.33 LVOT Area:     3.46 cm  RIGHT VENTRICLE RV S prime:     6.64 cm/s TAPSE (M-mode): 1.5 cm LEFT ATRIUM           Index      RIGHT ATRIUM  Index LA diam:      2.80 cm 1.41 cm/m RA Area:     8.33 cm LA Vol (A4C): 16.6 ml 8.34 ml/m RA Volume:   15.70 ml 7.88 ml/m   AORTA Ao Root diam: 3.20 cm MITRAL VALVE               TRICUSPID VALVE MV Area (PHT): 4.31 cm    TR Peak grad:   16.6 mmHg MV Decel Time: 176 msec    TR Vmax:        204.00 cm/s MV E velocity: 50.90 cm/s MV A velocity: 59.70 cm/s  SHUNTS MV E/A ratio:  0.85        Systemic Diam: 2.10 cm Kate Sable MD Electronically signed by Kate Sable MD Signature Date/Time: 06/01/2019/10:06:47 AM    Final       Subjective: Pt says he wants to go home, he is ambulating to bathroom, he is off oxygen.  No complaints.   Discharge Exam: Vitals:   06/03/19 0600 06/03/19 0801  BP:  130/75   Pulse: 68   Resp: 20   Temp: 98 F (36.7 C)   SpO2: 94% 95%   Vitals:   06/02/19 2000 06/02/19 2041 06/03/19 0600 06/03/19 0801  BP:  (!) 141/65 130/75   Pulse:  70 68   Resp:  20 20   Temp:  98.4 F (36.9 C) 98 F (36.7 C)   TempSrc:  Oral    SpO2: 96% 95% 94% 95%  Weight:      Height:       General: Pt is alert, awake, not in acute distress, ambulating in room.  Cardiovascular: RRR, S1/S2 +, no rubs, no gallops Respiratory: no increased WOB, no wheezing, no rhonchi Abdominal: Soft, NT, ND, bowel sounds + Extremities: no edema, no cyanosis    The results of significant diagnostics from this hospitalization (including imaging, microbiology, ancillary and laboratory) are listed below for reference.     Microbiology: Recent Results (from the past 240 hour(s))  Respiratory Panel by RT PCR (Flu A&B, Covid) - Nasopharyngeal Swab     Status: None   Collection Time: 06/01/19  1:56 AM   Specimen: Nasopharyngeal Swab  Result Value Ref Range Status   SARS Coronavirus 2 by RT PCR NEGATIVE NEGATIVE Final    Comment: (NOTE) SARS-CoV-2 target nucleic acids are NOT DETECTED. The SARS-CoV-2 RNA is generally detectable in upper respiratoy specimens during the acute phase of infection. The lowest concentration of SARS-CoV-2 viral copies this assay can detect is 131 copies/mL. A negative result does not preclude SARS-Cov-2 infection and should not be used as the sole basis for treatment or other patient management decisions. A negative result may occur with  improper specimen collection/handling, submission of specimen other than nasopharyngeal swab, presence of viral mutation(s) within the areas targeted by this assay, and inadequate number of viral copies (<131 copies/mL). A negative result must be combined with clinical observations, patient history, and epidemiological information. The expected result is Negative. Fact Sheet for Patients:   PinkCheek.be Fact Sheet for Healthcare Providers:  GravelBags.it This test is not yet ap proved or cleared by the [place] FDA and  has been authorized for detection and/or diagnosis of SARS-CoV-2 by FDA under an Emergency Use Authorization (EUA). This EUA will remain  in effect (meaning this test can be used) for the duration of the COVID-19 declaration under Section 564(b)(1) of the Act, 21 U.S.C. section 360bbb-3(b)(1), unless the authorization is terminated or revoked sooner.  Influenza A by PCR NEGATIVE NEGATIVE Final   Influenza B by PCR NEGATIVE NEGATIVE Final    Comment: (NOTE) The Xpert Xpress SARS-CoV-2/FLU/RSV assay is intended as an aid in  the diagnosis of influenza from Nasopharyngeal swab specimens and  should not be used as a sole basis for treatment. Nasal washings and  aspirates are unacceptable for Xpert Xpress SARS-CoV-2/FLU/RSV  testing. Fact Sheet for Patients: PinkCheek.be Fact Sheet for Healthcare Providers: GravelBags.it This test is not yet approved or cleared by the [place] FDA and  has been authorized for detection and/or diagnosis of SARS-CoV-2 by  FDA under an Emergency Use Authorization (EUA). This EUA will remain  in effect (meaning this test can be used) for the duration of the  Covid-19 declaration under Section 564(b)(1) of the Act, 21  U.S.C. section 360bbb-3(b)(1), unless the authorization is  terminated or revoked. Performed at [facility], [address] [place], [place] [address]   MRSA PCR Screening     Status: None   Collection Time: 06/01/19  8:59 AM   Specimen: Nasal Mucosa; Nasopharyngeal  Result Value Ref Range Status   MRSA by PCR NEGATIVE NEGATIVE Final    Comment:        The GeneXpert MRSA Assay (FDA approved for NASAL specimens only), is one component of a comprehensive MRSA  colonization surveillance program. It is not intended to diagnose MRSA infection nor to guide or monitor treatment for MRSA infections. Performed at [facility], [address] [place], [place] [address]      Labs: BNP (last 3 results) Recent Labs    06/01/19 0952  BNP [id number]*   Basic Metabolic Panel: Recent Labs  Lab 06/01/19 0131 06/02/19 0411 06/03/19 0521  NA 133* 136 139  K 5.1 4.5 4.6  CL 96* 96* 98  CO2 27 30 32  GLUCOSE 135* 108* 93  BUN 34* 35* 33*  CREATININE 1.54* 0.91 0.89  CALCIUM 10.6* 10.1 10.7*  MG  --  1.8 2.0   Liver Function Tests: Recent Labs  Lab 06/01/19 0131 06/02/19 0411 06/03/19 0521  AST 18 16 29   ALT 11 12 16   ALKPHOS 58 45 48  BILITOT 0.6 0.8 1.0  PROT 6.8 6.1* 6.7  ALBUMIN 3.6 3.3* 3.6   No results for input(s): LIPASE, AMYLASE in the last 168 hours. Recent Labs  Lab 06/01/19 0131  AMMONIA 40*   CBC: Recent Labs  Lab 06/01/19 0131 06/02/19 0411 06/03/19 0521  WBC 7.8 9.6 11.5*  NEUTROABS 5.8 8.7* 9.8*  HGB 17.1* 15.8 17.1*  HCT 54.6* 49.2 52.6*  MCV 106.6* 103.1* 101.5*  PLT 249 223 256   Cardiac Enzymes: No results for input(s): CKTOTAL, CKMB, CKMBINDEX, TROPONINI in the last 168 hours. BNP: Invalid input(s): POCBNP CBG: Recent Labs  Lab 06/02/19 0751 06/02/19 1242 06/02/19 1725 06/02/19 2042 06/03/19 0737  GLUCAP 125* 99 103* 100* 87   D-Dimer No results for input(s): DDIMER in the last 72 hours. Hgb A1c Recent Labs    06/01/19 0734  HGBA1C 6.2*   Lipid Profile Recent Labs    06/01/19 0733  CHOL 179  HDL 46  LDLCALC 113*  TRIG 100  CHOLHDL 3.9   Thyroid function studies Recent Labs    06/01/19 0733  TSH 0.447   Anemia work up Recent Labs    06/01/19 0733  VITAMINB12 559  FOLATE 8.4   Urinalysis    Component Value Date/Time   COLORURINE YELLOW 06/01/2019 [place] 06/01/2019 0057  LABSPEC 1.026 06/01/2019 0057   PHURINE 5.0 06/01/2019 0057   GLUCOSEU  NEGATIVE 06/01/2019 0057   HGBUR NEGATIVE 06/01/2019 0057   [place] 06/01/2019 [place] 06/01/2019 0057   PROTEINUR 30 (A) 06/01/2019 0057   NITRITE NEGATIVE 06/01/2019 0057   LEUKOCYTESUR NEGATIVE 06/01/2019 0057   Sepsis Labs Invalid input(s): PROCALCITONIN,  WBC,  LACTICIDVEN Microbiology Recent Results (from the past 240 hour(s))  Respiratory Panel by RT PCR (Flu A&B, Covid) - Nasopharyngeal Swab     Status: None   Collection Time: 06/01/19  1:56 AM   Specimen: Nasopharyngeal Swab  Result Value Ref Range Status   SARS Coronavirus 2 by RT PCR NEGATIVE NEGATIVE Final    Comment: (NOTE) SARS-CoV-2 target nucleic acids are NOT DETECTED. The SARS-CoV-2 RNA is generally detectable in upper respiratoy specimens during the acute phase of infection. The lowest concentration of SARS-CoV-2 viral copies this assay can detect is 131 copies/mL. A negative result does not preclude SARS-Cov-2 infection and should not be used as the sole basis for treatment or other patient management decisions. A negative result may occur with  improper specimen collection/handling, submission of specimen other than nasopharyngeal swab, presence of viral mutation(s) within the areas targeted by this assay, and inadequate number of viral copies (<131 copies/mL). A negative result must be combined with clinical observations, patient history, and epidemiological information. The expected result is Negative. Fact Sheet for Patients:  PinkCheek.be Fact Sheet for Healthcare Providers:  GravelBags.it This test is not yet ap proved or cleared by the [place] FDA and  has been authorized for detection and/or diagnosis of SARS-CoV-2 by FDA under an Emergency Use Authorization (EUA). This EUA will remain  in effect (meaning this test can be used) for the duration of the COVID-19 declaration under Section 564(b)(1) of the Act,  21 U.S.C. section 360bbb-3(b)(1), unless the authorization is terminated or revoked sooner.    Influenza A by PCR NEGATIVE NEGATIVE Final   Influenza B by PCR NEGATIVE NEGATIVE Final    Comment: (NOTE) The Xpert Xpress SARS-CoV-2/FLU/RSV assay is intended as an aid in  the diagnosis of influenza from Nasopharyngeal swab specimens and  should not be used as a sole basis for treatment. Nasal washings and  aspirates are unacceptable for Xpert Xpress SARS-CoV-2/FLU/RSV  testing. Fact Sheet for Patients: PinkCheek.be Fact Sheet for Healthcare Providers: GravelBags.it This test is not yet approved or cleared by the [place] FDA and  has been authorized for detection and/or diagnosis of SARS-CoV-2 by  FDA under an Emergency Use Authorization (EUA). This EUA will remain  in effect (meaning this test can be used) for the duration of the  Covid-19 declaration under Section 564(b)(1) of the Act, 21  U.S.C. section 360bbb-3(b)(1), unless the authorization is  terminated or revoked. Performed at [facility], [address], [place], [place] [address]   MRSA PCR Screening     Status: None   Collection Time: 06/01/19  8:59 AM   Specimen: Nasal Mucosa; Nasopharyngeal  Result Value Ref Range Status   MRSA by PCR NEGATIVE NEGATIVE Final    Comment:        The GeneXpert MRSA Assay (FDA approved for NASAL specimens only), is one component of a comprehensive MRSA colonization surveillance program. It is not intended to diagnose MRSA infection nor to guide or monitor treatment for MRSA infections. Performed at [facility], [address] [place],  [address]    Time coordinating discharge: 34 minutes   SIGNED:  Irwin Brakeman, MD  [facility] 06/03/2019, 9:19 AM How to contact the [facility] Attending or Consulting provider [place] or covering provider during after hours [place], for this patient?  1. Check the care  team in [facility] and look for a) attending/consulting TRH provider listed and b) the [facility] team listed 2. Log into [email or web address] and use [place]'s universal password to access. If you do not have the password, please contact the hospital operator. 3. Locate the [facility] provider you are looking for under [facility] and page to a number that you can be directly reached. 4. If you still have difficulty reaching the provider, please page the [facility] (Director on Call) for the Hospitalists listed on amion for assistance.

## 2019-06-22 ENCOUNTER — Ambulatory Visit: Payer: Medicare Other | Admitting: Student

## 2019-06-29 ENCOUNTER — Other Ambulatory Visit: Payer: Self-pay

## 2019-06-29 ENCOUNTER — Emergency Department (HOSPITAL_COMMUNITY): Payer: Medicare Other

## 2019-06-29 ENCOUNTER — Encounter (HOSPITAL_COMMUNITY): Payer: Self-pay

## 2019-06-29 ENCOUNTER — Inpatient Hospital Stay (HOSPITAL_COMMUNITY): Payer: Medicare Other

## 2019-06-29 ENCOUNTER — Inpatient Hospital Stay (HOSPITAL_COMMUNITY)
Admission: EM | Admit: 2019-06-29 | Discharge: 2019-07-04 | DRG: 871 | Disposition: A | Discharge status: Skilled Nursing Facility | Payer: Medicare Other | Attending: Family Medicine | Admitting: Family Medicine

## 2019-06-29 DIAGNOSIS — F1120 Opioid dependence, uncomplicated: Secondary | ICD-10-CM | POA: Diagnosis present

## 2019-06-29 DIAGNOSIS — F329 Major depressive disorder, single episode, unspecified: Secondary | ICD-10-CM | POA: Diagnosis present

## 2019-06-29 DIAGNOSIS — Z20822 Contact with and (suspected) exposure to covid-19: Secondary | ICD-10-CM | POA: Diagnosis present

## 2019-06-29 DIAGNOSIS — R7401 Elevation of levels of liver transaminase levels: Secondary | ICD-10-CM | POA: Diagnosis present

## 2019-06-29 DIAGNOSIS — J690 Pneumonitis due to inhalation of food and vomit: Secondary | ICD-10-CM | POA: Diagnosis present

## 2019-06-29 DIAGNOSIS — A419 Sepsis, unspecified organism: Principal | ICD-10-CM | POA: Diagnosis present

## 2019-06-29 DIAGNOSIS — Z951 Presence of aortocoronary bypass graft: Secondary | ICD-10-CM

## 2019-06-29 DIAGNOSIS — J9601 Acute respiratory failure with hypoxia: Secondary | ICD-10-CM | POA: Diagnosis not present

## 2019-06-29 DIAGNOSIS — R6521 Severe sepsis with septic shock: Secondary | ICD-10-CM | POA: Diagnosis present

## 2019-06-29 DIAGNOSIS — Z7902 Long term (current) use of antithrombotics/antiplatelets: Secondary | ICD-10-CM

## 2019-06-29 DIAGNOSIS — L89151 Pressure ulcer of sacral region, stage 1: Secondary | ICD-10-CM | POA: Diagnosis present

## 2019-06-29 DIAGNOSIS — K5900 Constipation, unspecified: Secondary | ICD-10-CM | POA: Diagnosis present

## 2019-06-29 DIAGNOSIS — D1803 Hemangioma of intra-abdominal structures: Secondary | ICD-10-CM | POA: Diagnosis present

## 2019-06-29 DIAGNOSIS — I248 Other forms of acute ischemic heart disease: Secondary | ICD-10-CM | POA: Diagnosis present

## 2019-06-29 DIAGNOSIS — R579 Shock, unspecified: Secondary | ICD-10-CM

## 2019-06-29 DIAGNOSIS — Z23 Encounter for immunization: Secondary | ICD-10-CM | POA: Diagnosis not present

## 2019-06-29 DIAGNOSIS — Z7982 Long term (current) use of aspirin: Secondary | ICD-10-CM

## 2019-06-29 DIAGNOSIS — J441 Chronic obstructive pulmonary disease with (acute) exacerbation: Secondary | ICD-10-CM | POA: Diagnosis present

## 2019-06-29 DIAGNOSIS — K219 Gastro-esophageal reflux disease without esophagitis: Secondary | ICD-10-CM | POA: Diagnosis present

## 2019-06-29 DIAGNOSIS — I2510 Atherosclerotic heart disease of native coronary artery without angina pectoris: Secondary | ICD-10-CM | POA: Diagnosis present

## 2019-06-29 DIAGNOSIS — J440 Chronic obstructive pulmonary disease with acute lower respiratory infection: Secondary | ICD-10-CM | POA: Diagnosis present

## 2019-06-29 DIAGNOSIS — J9600 Acute respiratory failure, unspecified whether with hypoxia or hypercapnia: Secondary | ICD-10-CM

## 2019-06-29 DIAGNOSIS — I959 Hypotension, unspecified: Secondary | ICD-10-CM | POA: Diagnosis present

## 2019-06-29 DIAGNOSIS — Z9911 Dependence on respirator [ventilator] status: Secondary | ICD-10-CM | POA: Diagnosis not present

## 2019-06-29 DIAGNOSIS — Z79899 Other long term (current) drug therapy: Secondary | ICD-10-CM

## 2019-06-29 DIAGNOSIS — G8929 Other chronic pain: Secondary | ICD-10-CM | POA: Diagnosis present

## 2019-06-29 DIAGNOSIS — K7200 Acute and subacute hepatic failure without coma: Secondary | ICD-10-CM | POA: Diagnosis present

## 2019-06-29 DIAGNOSIS — J9602 Acute respiratory failure with hypercapnia: Secondary | ICD-10-CM | POA: Diagnosis not present

## 2019-06-29 DIAGNOSIS — Z7951 Long term (current) use of inhaled steroids: Secondary | ICD-10-CM

## 2019-06-29 DIAGNOSIS — F1290 Cannabis use, unspecified, uncomplicated: Secondary | ICD-10-CM | POA: Diagnosis present

## 2019-06-29 DIAGNOSIS — E874 Mixed disorder of acid-base balance: Secondary | ICD-10-CM | POA: Diagnosis present

## 2019-06-29 DIAGNOSIS — T40604A Poisoning by unspecified narcotics, undetermined, initial encounter: Secondary | ICD-10-CM | POA: Diagnosis not present

## 2019-06-29 DIAGNOSIS — J9622 Acute and chronic respiratory failure with hypercapnia: Secondary | ICD-10-CM | POA: Diagnosis present

## 2019-06-29 DIAGNOSIS — E860 Dehydration: Secondary | ICD-10-CM | POA: Diagnosis present

## 2019-06-29 DIAGNOSIS — R4182 Altered mental status, unspecified: Secondary | ICD-10-CM

## 2019-06-29 DIAGNOSIS — N179 Acute kidney failure, unspecified: Secondary | ICD-10-CM | POA: Diagnosis present

## 2019-06-29 DIAGNOSIS — T402X1A Poisoning by other opioids, accidental (unintentional), initial encounter: Secondary | ICD-10-CM | POA: Diagnosis present

## 2019-06-29 DIAGNOSIS — I700 Atherosclerosis of aorta: Secondary | ICD-10-CM | POA: Diagnosis present

## 2019-06-29 DIAGNOSIS — F17210 Nicotine dependence, cigarettes, uncomplicated: Secondary | ICD-10-CM | POA: Diagnosis present

## 2019-06-29 DIAGNOSIS — Z781 Physical restraint status: Secondary | ICD-10-CM

## 2019-06-29 DIAGNOSIS — Z885 Allergy status to narcotic agent status: Secondary | ICD-10-CM

## 2019-06-29 DIAGNOSIS — J9621 Acute and chronic respiratory failure with hypoxia: Secondary | ICD-10-CM | POA: Diagnosis present

## 2019-06-29 DIAGNOSIS — N170 Acute kidney failure with tubular necrosis: Secondary | ICD-10-CM | POA: Diagnosis not present

## 2019-06-29 DIAGNOSIS — Z825 Family history of asthma and other chronic lower respiratory diseases: Secondary | ICD-10-CM

## 2019-06-29 DIAGNOSIS — G92 Toxic encephalopathy: Secondary | ICD-10-CM | POA: Diagnosis present

## 2019-06-29 DIAGNOSIS — E785 Hyperlipidemia, unspecified: Secondary | ICD-10-CM | POA: Diagnosis present

## 2019-06-29 DIAGNOSIS — L8990 Pressure ulcer of unspecified site, unspecified stage: Secondary | ICD-10-CM | POA: Insufficient documentation

## 2019-06-29 DIAGNOSIS — R7989 Other specified abnormal findings of blood chemistry: Secondary | ICD-10-CM

## 2019-06-29 DIAGNOSIS — R778 Other specified abnormalities of plasma proteins: Secondary | ICD-10-CM

## 2019-06-29 LAB — CBC WITH DIFFERENTIAL/PLATELET
Abs Immature Granulocytes: 0.07 10*3/uL (ref 0.00–0.07)
Basophils Absolute: 0 10*3/uL (ref 0.0–0.1)
Basophils Relative: 0 %
Eosinophils Absolute: 0 10*3/uL (ref 0.0–0.5)
Eosinophils Relative: 0 %
HCT: 54 % — ABNORMAL HIGH (ref 39.0–52.0)
Hemoglobin: 16.5 g/dL (ref 13.0–17.0)
Immature Granulocytes: 1 %
Lymphocytes Relative: 9 %
Lymphs Abs: 0.9 10*3/uL (ref 0.7–4.0)
MCH: 33.3 pg (ref 26.0–34.0)
MCHC: 30.6 g/dL (ref 30.0–36.0)
MCV: 108.9 fL — ABNORMAL HIGH (ref 80.0–100.0)
Monocytes Absolute: 0.7 10*3/uL (ref 0.1–1.0)
Monocytes Relative: 7 %
Neutro Abs: 8.9 10*3/uL — ABNORMAL HIGH (ref 1.7–7.7)
Neutrophils Relative %: 83 %
Platelets: 331 10*3/uL (ref 150–400)
RBC: 4.96 MIL/uL (ref 4.22–5.81)
RDW: 16.3 % — ABNORMAL HIGH (ref 11.5–15.5)
WBC: 10.7 10*3/uL — ABNORMAL HIGH (ref 4.0–10.5)
nRBC: 0 % (ref 0.0–0.2)

## 2019-06-29 LAB — LIPASE, BLOOD: Lipase: 48 U/L (ref 11–51)

## 2019-06-29 LAB — RAPID URINE DRUG SCREEN, HOSP PERFORMED
Amphetamines: NOT DETECTED
Barbiturates: NOT DETECTED
Benzodiazepines: NOT DETECTED
Cocaine: NOT DETECTED
Opiates: POSITIVE — AB
Tetrahydrocannabinol: POSITIVE — AB

## 2019-06-29 LAB — AMMONIA: Ammonia: 29 umol/L (ref 9–35)

## 2019-06-29 LAB — URINALYSIS, ROUTINE W REFLEX MICROSCOPIC
Glucose, UA: NEGATIVE mg/dL
Ketones, ur: NEGATIVE mg/dL
Leukocytes,Ua: NEGATIVE
Nitrite: NEGATIVE
Protein, ur: 100 mg/dL — AB
Specific Gravity, Urine: 1.024 (ref 1.005–1.030)
pH: 5 (ref 5.0–8.0)

## 2019-06-29 LAB — COMPREHENSIVE METABOLIC PANEL
ALT: 373 U/L — ABNORMAL HIGH (ref 0–44)
AST: 372 U/L — ABNORMAL HIGH (ref 15–41)
Albumin: 3.3 g/dL — ABNORMAL LOW (ref 3.5–5.0)
Alkaline Phosphatase: 97 U/L (ref 38–126)
Anion gap: 18 — ABNORMAL HIGH (ref 5–15)
BUN: 30 mg/dL — ABNORMAL HIGH (ref 6–20)
CO2: 27 mmol/L (ref 22–32)
Calcium: 10 mg/dL (ref 8.9–10.3)
Chloride: 93 mmol/L — ABNORMAL LOW (ref 98–111)
Creatinine, Ser: 2.38 mg/dL — ABNORMAL HIGH (ref 0.61–1.24)
GFR calc Af Amer: 33 mL/min — ABNORMAL LOW (ref >60)
GFR calc non Af Amer: 29 mL/min — ABNORMAL LOW (ref >60)
Glucose, Bld: 167 mg/dL — ABNORMAL HIGH (ref 70–99)
Potassium: 4.4 mmol/L (ref 3.5–5.1)
Sodium: 138 mmol/L (ref 135–145)
Total Bilirubin: 0.6 mg/dL (ref 0.3–1.2)
Total Protein: 6.4 g/dL — ABNORMAL LOW (ref 6.5–8.1)

## 2019-06-29 LAB — BLOOD GAS, ARTERIAL
Acid-Base Excess: 3.3 mmol/L — ABNORMAL HIGH (ref 0.0–2.0)
Bicarbonate: 26.5 mmol/L (ref 20.0–28.0)
FIO2: 40
O2 Saturation: 94.4 %
Patient temperature: 37
pCO2 arterial: 47.7 mmHg (ref 32.0–48.0)
pH, Arterial: 7.386 (ref 7.350–7.450)
pO2, Arterial: 72.1 mmHg — ABNORMAL LOW (ref 83.0–108.0)

## 2019-06-29 LAB — BLOOD GAS, VENOUS
Acid-Base Excess: 1.6 mmol/L (ref 0.0–2.0)
Bicarbonate: 20.8 mmol/L (ref 20.0–28.0)
FIO2: 32
O2 Saturation: 29.9 %
Patient temperature: 37.1
pCO2, Ven: 81.6 mmHg (ref 44.0–60.0)
pH, Ven: 7.18 — CL (ref 7.250–7.430)
pO2, Ven: <31 mmHg — CL (ref 32.0–45.0)

## 2019-06-29 LAB — TROPONIN I (HIGH SENSITIVITY)
Troponin I (High Sensitivity): 1059 ng/L (ref <18)
Troponin I (High Sensitivity): 1566 ng/L (ref <18)

## 2019-06-29 LAB — SALICYLATE LEVEL: Salicylate Lvl: <7 mg/dL — ABNORMAL LOW (ref 7.0–30.0)

## 2019-06-29 LAB — RESPIRATORY PANEL BY RT PCR (FLU A&B, COVID)
Influenza A by PCR: NEGATIVE
Influenza B by PCR: NEGATIVE
SARS Coronavirus 2 by RT PCR: NEGATIVE

## 2019-06-29 LAB — BRAIN NATRIURETIC PEPTIDE: B Natriuretic Peptide: 1099 pg/mL — ABNORMAL HIGH (ref 0.0–100.0)

## 2019-06-29 LAB — LACTIC ACID, PLASMA
Lactic Acid, Venous: 7.6 mmol/L (ref 0.5–1.9)
Lactic Acid, Venous: 3.2 mmol/L (ref 0.5–1.9)

## 2019-06-29 LAB — ACETAMINOPHEN LEVEL: Acetaminophen (Tylenol), Serum: <10 ug/mL — ABNORMAL LOW (ref 10–30)

## 2019-06-29 LAB — ETHANOL: Alcohol, Ethyl (B): <10 mg/dL (ref <10)

## 2019-06-29 LAB — POC SARS CORONAVIRUS 2 AG -  ED: SARS Coronavirus 2 Ag: NEGATIVE

## 2019-06-29 MED ORDER — SODIUM CHLORIDE 0.9 % IV BOLUS
1000.0000 mL | Freq: Once | INTRAVENOUS | Status: AC
  Administered 2019-06-29: 1000 mL via INTRAVENOUS

## 2019-06-29 MED ORDER — MIDAZOLAM HCL 5 MG/5ML IJ SOLN: 4.0000 mg | Freq: Once | INTRAMUSCULAR | Status: AC

## 2019-06-29 MED ORDER — MIDAZOLAM HCL 5 MG/5ML IJ SOLN
INTRAMUSCULAR | Status: AC
  Administered 2019-06-29: 4 mg via INTRAVENOUS
  Filled 2019-06-29: qty 5

## 2019-06-29 MED ORDER — SODIUM CHLORIDE 0.9% FLUSH
10.0000 mL | Freq: Two times a day (BID) | INTRAVENOUS | Status: DC
  Administered 2019-06-30 – 2019-07-03 (×8): 10 mL

## 2019-06-29 MED ORDER — SODIUM CHLORIDE 0.9% FLUSH: 10.0000 mL | INTRAVENOUS | Status: DC | PRN

## 2019-06-29 MED ORDER — PROPOFOL 1000 MG/100ML IV EMUL
5.0000 ug/kg/min | INTRAVENOUS | Status: DC
  Administered 2019-06-30: 5 ug/kg/min via INTRAVENOUS
  Filled 2019-06-29 (×2): qty 100

## 2019-06-29 MED ORDER — PIPERACILLIN-TAZOBACTAM 3.375 G IVPB 30 MIN
3.3750 g | Freq: Once | INTRAVENOUS | Status: AC
  Administered 2019-06-29: 3.375 g via INTRAVENOUS
  Filled 2019-06-29: qty 50

## 2019-06-29 MED ORDER — SODIUM CHLORIDE 0.9 % IV SOLN
15.0000 mg/kg | Freq: Once | INTRAVENOUS | Status: AC
  Administered 2019-06-29: 1160 mg via INTRAVENOUS
  Filled 2019-06-29: qty 1.16

## 2019-06-29 MED ORDER — CHLORHEXIDINE GLUCONATE CLOTH 2 % EX PADS
6.0000 | MEDICATED_PAD | Freq: Every day | CUTANEOUS | Status: DC
  Administered 2019-06-30 – 2019-07-03 (×4): 6 via TOPICAL

## 2019-06-29 MED ORDER — NOREPINEPHRINE 4 MG/250ML-% IV SOLN
12.0000 ug/min | INTRAVENOUS | Status: DC
  Administered 2019-06-29: 11 ug/min via INTRAVENOUS
  Administered 2019-06-30: 12 ug/min via INTRAVENOUS
  Filled 2019-06-29: qty 250

## 2019-06-29 MED ORDER — NALOXONE HCL 2 MG/2ML IJ SOSY
PREFILLED_SYRINGE | INTRAMUSCULAR | Status: AC
  Administered 2019-06-29: 1 mg via INTRAVENOUS
  Filled 2019-06-29: qty 2

## 2019-06-29 MED ORDER — NOREPINEPHRINE 4 MG/250ML-% IV SOLN
INTRAVENOUS | Status: AC
  Administered 2019-06-29: 12 ug/min via INTRAVENOUS
  Filled 2019-06-29: qty 250

## 2019-06-29 MED ORDER — ETOMIDATE 2 MG/ML IV SOLN
20.0000 mg | Freq: Once | INTRAVENOUS | Status: AC
  Administered 2019-06-29: 10 mg via INTRAVENOUS

## 2019-06-29 MED ORDER — PIPERACILLIN-TAZOBACTAM IN DEX 2-0.25 GM/50ML IV SOLN: 2.2500 g | Freq: Once | INTRAVENOUS | Status: DC

## 2019-06-29 MED ORDER — PROPOFOL 1000 MG/100ML IV EMUL
INTRAVENOUS | Status: AC
  Administered 2019-06-29: 30 ug/kg/min via INTRAVENOUS
  Filled 2019-06-29: qty 100

## 2019-06-29 MED ORDER — PIPERACILLIN-TAZOBACTAM 3.375 G IVPB: 3.3750 g | Freq: Three times a day (TID) | INTRAVENOUS | Status: DC

## 2019-06-29 MED ORDER — SUCCINYLCHOLINE CHLORIDE 20 MG/ML IJ SOLN
100.0000 mg | Freq: Once | INTRAMUSCULAR | Status: AC
  Administered 2019-06-29: 100 mg via INTRAVENOUS

## 2019-06-29 MED ORDER — VANCOMYCIN HCL 1500 MG/300ML IV SOLN: 1500.0000 mg | INTRAVENOUS | Status: DC

## 2019-06-29 MED ORDER — SODIUM CHLORIDE 0.9 % IV SOLN
2.0000 g | Freq: Two times a day (BID) | INTRAVENOUS | Status: DC
  Administered 2019-06-30 (×2): 2 g via INTRAVENOUS
  Filled 2019-06-29 (×2): qty 2

## 2019-06-29 MED ORDER — VANCOMYCIN HCL 1750 MG/350ML IV SOLN
1750.0000 mg | Freq: Once | INTRAVENOUS | Status: AC
  Administered 2019-06-29: 1750 mg via INTRAVENOUS
  Filled 2019-06-29: qty 350

## 2019-06-29 MED ORDER — VANCOMYCIN HCL IN DEXTROSE 1-5 GM/200ML-% IV SOLN: 1000.0000 mg | Freq: Once | INTRAVENOUS | Status: DC

## 2019-06-29 MED ORDER — NALOXONE HCL 0.4 MG/ML IJ SOLN
0.4000 mg | Freq: Once | INTRAMUSCULAR | Status: AC
  Administered 2019-06-29: 0.4 mg via INTRAVENOUS
  Filled 2019-06-29: qty 1

## 2019-06-29 MED ORDER — CHLORHEXIDINE GLUCONATE 0.12% ORAL RINSE (MEDLINE KIT)
15.0000 mL | Freq: Two times a day (BID) | OROMUCOSAL | Status: DC
  Administered 2019-06-30 (×2): 15 mL via OROMUCOSAL

## 2019-06-29 MED ORDER — SODIUM CHLORIDE 0.9 % IV SOLN
2.0000 g | Freq: Once | INTRAVENOUS | Status: AC
  Administered 2019-06-29: 2 g via INTRAVENOUS
  Filled 2019-06-29: qty 2

## 2019-06-29 MED ORDER — ENOXAPARIN SODIUM 30 MG/0.3ML ~~LOC~~ SOLN
30.0000 mg | SUBCUTANEOUS | Status: DC
  Administered 2019-06-29: 30 mg via SUBCUTANEOUS
  Filled 2019-06-29: qty 0.3

## 2019-06-29 MED ORDER — SODIUM CHLORIDE 0.9 % IV SOLN
10.0000 mg/kg | Freq: Two times a day (BID) | INTRAVENOUS | Status: DC
  Filled 2019-06-29: qty 0.77

## 2019-06-29 MED ORDER — SODIUM CHLORIDE 0.9 % IV SOLN
10.0000 mg/kg | Freq: Once | INTRAVENOUS | Status: DC
  Filled 2019-06-29: qty 0.77

## 2019-06-29 MED ORDER — ORAL CARE MOUTH RINSE
15.0000 mL | OROMUCOSAL | Status: DC
  Administered 2019-06-30 (×8): 15 mL via OROMUCOSAL

## 2019-06-29 MED ORDER — SODIUM CHLORIDE 0.9 % IV SOLN: Freq: Once | INTRAVENOUS | Status: AC

## 2019-06-29 MED ORDER — SODIUM CHLORIDE 0.9 % IV BOLUS
500.0000 mL | Freq: Once | INTRAVENOUS | Status: AC
  Administered 2019-06-29: 500 mL via INTRAVENOUS

## 2019-06-29 MED ORDER — NALOXONE HCL 2 MG/2ML IJ SOSY: 1.0000 mg | PREFILLED_SYRINGE | Freq: Once | INTRAMUSCULAR | Status: AC

## 2019-06-29 NOTE — Progress Notes (Signed)
eLink Physician-Brief Progress Note Patient Name: Antonio Ramirez DOB: [birth year] MRN: [id number]   Date of Service  06/29/2019  HPI/Events of Note  Pt with altered mental status, lactic acidosis, troponin leak, acute respiratory failure s/p intubation and a concern for toxic ingestion vs septic shock, Pt was seen at [facility] ED and transferred to [facility] ICU to be seen by PCCM.  eICU Interventions  New Patient Evaluation completed. PCCM bedside physician asked to admit patient.        [name] Jerame Hedding 06/29/2019, 11:51 PM

## 2019-06-29 NOTE — ED Provider Notes (Signed)
Medical screening examination/treatment/procedure(s) were conducted as a shared visit with non-physician practitioner(s) and myself.  I personally evaluated the patient during the encounter.  Clinical Impression:   Final diagnoses:  Sepsis, due to unspecified organism, unspecified whether acute organ dysfunction present ([place])  Altered mental status, unspecified altered mental status type  Shock ([place])  AKI (acute kidney injury) ([place])  Elevated troponin    This critically ill appearing 59 year old male presents with altered mental status.  He is hypotensive tachycardic has a lactic acidosis and a significant metabolic acidosis with an elevated troponin over 1000.  I have looked at his chest x-ray which seems to be clear, he has no focal edema, he has weak pulses and he has borderline hypotension but is responding to fluids.  Currently the patient is able to open his eyes, open his mouth, he will follow basic commands but has significant jerking of his muscles.  In the past the patient had some element of hepatic encephalopathy and required BiPAP and admission.  This seems similar to what happened last month.  At this time the patient will need treatment for possible septic shock, he is critically ill, will need to be admitted to high level of care but at this time is protecting his airway and on 2 L of nasal cannula is 92%.  This patient had a persistent altered mental status, intermittent hypotension and required not only IV fluid resuscitation but vasopressor use with Levophed.  I personally supervised the intubation as well as the central line placement, these were both done successfully, confirmed by x-ray, endotracheal tube and central line in the correct position.  Patient will be admitted to the ICU, he is critically ill, please see separate documentation by physician assistant.  Procedure supervised:  Intubation Central line Critical care     Noemi Chapel, MD 06/30/19 1526

## 2019-06-29 NOTE — ED Notes (Signed)
CRITICAL VALUE ALERT  Critical Value:  Lactic Acid 7.6  Date & Time Notied:  06/29/19 1436  Provider Notified: Providence Lanius, PA  Orders Received/Actions taken: Lana Fish, Charge RN EDP notified

## 2019-06-29 NOTE — ED Notes (Signed)
CRITICAL VALUE ALERT  Critical Value:  Troponin 1,059  Date & Time Notied:  06/29/19 1440  Provider Notified: Providence Lanius, PA  Orders Received/Actions taken: EDP notified

## 2019-06-29 NOTE — Progress Notes (Signed)
Acute hypercarbia Trop leak Transaminitis Lactic acidosis AKI N/V precludes bipap  Warrants intubation and can send to [facility] ICU ([facility] full), will notify team there.  Please give vanc/cefepime prior to transfer and send procalcitonin.  THanks  Erskine Emery MD PCCM

## 2019-06-29 NOTE — Progress Notes (Signed)
Waiting to place BIPAP due to pt going to CT

## 2019-06-29 NOTE — H&P (Signed)
NAME:  Antonio Ramirez, MRN:  [id number], DOB:  1960-08-06, LOS: 0 ADMISSION DATE:  06/29/2019, CONSULTATION DATE: 06/29/2019 REFERRING MD: Forestine Na, ED, CHIEF COMPLAINT: Status post opioid overdose  Brief History   Patient seen in [facility] emergency room for altered mental status with profound metabolic acidosis.  History of present illness   Patient is a 59 year old Caucasian male1 ppd smoker with CAD s/p CABG, COPD, chronic back pain with opioid dependence who presented to ED at [facility] with altered mental status.  He was found to have a profound metabolic acidosis presumably secondary to lactic acidosis with an anion gap of 18 lactic acid of 7.6.  Urine drug screen was positive for opioids and THC.  Alcohol level was negative.  He had a similar previous presentation about a month ago but did not require intubation at that time.  Narcan in the emergency room did improve his alertness.  There is no other known ingestion.  His creatinine at time of presentation was 2.38, due to this and calcium oxalate crystals the patient was started on fomepizole empirically.  Nephrology seeing the patient does not recommend dialysis at this time recommends hydration with lactated Ringer's.  Bicarbonate had corrected and pH was 7.38 on blood gas done at 8 PM.  Patient also had elevated troponin of the thousand this is felt to be a demand issue per cardiology.  Patient had a similar presentation approximately 1 month ago at [facility].  Past Medical History   . CAD (coronary artery disease)   . COPD (chronic obstructive pulmonary disease) ([place])   . Opioid dependence ([place])   . [place] [facility] Events   Intubated in [facility] emergency room 06/29/2019  Consults:  Nephrology  Procedures:  Intubation  Significant Diagnostic Tests:  Chemistry with metabolic acidosis acute renal injury no acute changes on CT scan of the brain for abdominal CT  Micro Data:   NA  Antimicrobials:  Empiric vancomycin and cefepime  Interim history/subjective:  NA  Objective   Blood pressure 112/81, pulse 80, temperature (!) 97.2 F (36.2 C), temperature source Rectal, resp. rate 18, height 6' (1.829 m), weight 77 kg, SpO2 99 %.    Vent Mode: PRVC FiO2 (%):  [40 %] 40 % Set Rate:  [18 bmp] 18 bmp Vt Set:  [580 mL] 580 mL PEEP:  [5 cmH20] 5 cmH20 Plateau Pressure:  [16 cmH20-20 cmH20] 20 cmH20   Intake/Output Summary (Last 24 hours) at 06/29/2019 2307 Last data filed at 06/29/2019 2015 Gross per 24 hour  Intake 2719.18 ml  Output 70 ml  Net 2649.18 ml   Filed Weights   06/29/19 1227  Weight: 77 kg    Examination: General: WM appears odler than stated age, sedated, ventilated HENT:WNL Lungs: clear Cardiovascular: reg rate rhythm Abdomen: benign, decr bs Extremities: cool, trace edema Neuro: sedated GU: wnl  Resolved Hospital Problem list   NA  Assessment & Plan:  1.  Opioid overdose requiring intubation: Patient will be ventilated overnight reassess in the morning for possible extubation.  No evidence of other ingestion.  Salicylate acetaminophen levels normal.  2.  Respiratory acidosis and acute respiratory failure corrected with mechanical ventilation.  Will leave ventilated overnight reassess for extubation based on mental status and laboratories in the morning  3.  Metabolic acidosis with a lactic acidosis presumably secondary to dehydration long downtime although history is not entirely clear.  4.  Acute kidney injury: Continue recommendations per  nephrology  5.  Elevated troponin: Continue to follow with echocardiogram in the morning  6.  Sepsis SIRS volume expansion is needed with empiric antibiotics started blood cultures planted.  Best practice:  Diet: N.p.o. Pain/Anxiety/Delirium protocol (if indicated): Propofol for conscious sedation on mechanical ventilation VAP protocol (if indicated): Yes DVT prophylaxis: Lovenox GI  prophylaxis: Pepcid Glucose control: Monitor Mobility: Bedrest Code Status: Full Family Communication: Unable to reach Disposition: To ICU  Labs   CBC: Recent Labs  Lab 06/29/19 1329  WBC 10.7*  NEUTROABS 8.9*  HGB 16.5  HCT 54.0*  MCV 108.9*  PLT [id number]    Basic Metabolic Panel: Recent Labs  Lab 06/29/19 1329  NA 138  K 4.4  CL 93*  CO2 27  GLUCOSE 167*  BUN 30*  CREATININE 2.38*  CALCIUM 10.0   GFR: Estimated Creatinine Clearance: 36.4 mL/min (A) (by C-G formula based on SCr of 2.38 mg/dL (H)). Recent Labs  Lab 06/29/19 1329 06/29/19 1528  WBC 10.7*  --   LATICACIDVEN 7.6* 3.2*    Liver Function Tests: Recent Labs  Lab 06/29/19 1329  AST 372*  ALT 373*  ALKPHOS 97  BILITOT 0.6  PROT 6.4*  ALBUMIN 3.3*   Recent Labs  Lab 06/29/19 1329  LIPASE 48   Recent Labs  Lab 06/29/19 1527  AMMONIA 29    ABG    Component Value Date/Time   PHART 7.386 06/29/2019 1956   PCO2ART 47.7 06/29/2019 1956   PO2ART 72.1 (L) 06/29/2019 1956   HCO3 26.5 06/29/2019 1956   O2SAT 94.4 06/29/2019 1956     Coagulation Profile: No results for input(s): INR, PROTIME in the last 168 hours.  Cardiac Enzymes: No results for input(s): CKTOTAL, CKMB, CKMBINDEX, TROPONINI in the last 168 hours.  HbA1C: Hgb A1c MFr Bld  Date/Time Value Ref Range Status  06/01/2019 07:34 AM 6.2 (H) 4.8 - 5.6 % Final    Comment:    (NOTE) Pre diabetes:          5.7%-6.4% Diabetes:              >6.4% Glycemic control for   <7.0% adults with diabetes     CBG: No results for input(s): GLUCAP in the last 168 hours.  Review of Systems:   Unable to obtain due to sedation mechanical ventilation Past Medical History  He,  has a past medical history of CAD (coronary artery disease), COPD (chronic obstructive pulmonary disease) ([place]), Opioid dependence ([place]), and Smoker.   Surgical History    Past Surgical History:  Procedure Laterality Date  . cardiac bypass    . CARDIAC  SURGERY    . COLON SURGERY       Social History   reports that he has been smoking cigarettes. He has been smoking about 1.00 pack per day. He has never used smokeless tobacco. He reports that he does not drink alcohol or use drugs.   Family History   His family history includes COPD in his mother.   Allergies Allergies  Allergen Reactions  . Opana [Oxymorphone Hcl] Hives    hiv     Home Medications  Prior to Admission medications   Medication Sig Start Date End Date Taking? Authorizing Provider  albuterol (VENTOLIN HFA) 108 (90 Base) MCG/ACT inhaler Inhale 1-2 puffs into the lungs every 6 (six) hours as needed for wheezing or shortness of breath. 06/03/19   Johnson, Clanford L, MD  AMITIZA 24 MCG capsule Take 24 mcg by mouth 2 (two) times  daily with a meal. 05/17/19   [provider]  aspirin EC 81 MG tablet Take 1 tablet (81 mg total) by mouth daily. 06/03/19   Johnson, Clanford L, MD  atorvastatin (LIPITOR) 40 MG tablet Take 1 tablet (40 mg total) by mouth daily at 6 PM. 06/03/19   Johnson, Clanford L, MD  calcitRIOL (ROCALTROL) 0.5 MCG capsule Take 0.5 mcg by mouth daily. 03/16/19   [provider]  citalopram (CELEXA) 10 MG tablet Take 10 tablets by mouth daily.  02/02/17   [provider]  gabapentin (NEURONTIN) 600 MG tablet Take 600 mg by mouth at bedtime.  05/27/19   [provider]  isosorbide mononitrate (IMDUR) 30 MG 24 hr tablet Take 30 mg by mouth daily.  01/22/17   [provider]  omeprazole (PRILOSEC) 20 MG capsule Take 20 mg by mouth daily before breakfast. 30 min prior to breakfast. 04/13/19   [provider]  oxyCODONE (ROXICODONE) 15 MG immediate release tablet Take 15 mg by mouth 3 (three) times daily as needed for pain.  05/27/19   [provider]  polyethylene glycol (MIRALAX / GLYCOLAX) packet Take 17 g by mouth daily. Patient taking differently: Take 17 g by mouth daily as needed for mild constipation or  moderate constipation.  02/11/17   Margette Fast, MD  predniSONE (DELTASONE) 20 MG tablet Take 2 PO QAM x5days 06/04/19   Johnson, Clanford L, MD  senna-docusate (SENOKOT-S) 8.6-50 MG tablet Take 2 tablets by mouth at bedtime as needed for mild constipation. 06/03/19   Johnson, Eldridge Dace, MD  SYMBICORT 160-4.5 MCG/ACT inhaler Inhale 2 puffs into the lungs 2 (two) times daily. 04/29/19   [provider]     Critical care time: Over 35 minutes spent in bedside evaluation, chart review and critical care planning

## 2019-06-29 NOTE — ED Notes (Signed)
CRITICAL VALUE ALERT  Critical Value:  Troponin 1,566  Date & Time Notified:  06/29/19 1655  Provider Notified: Cherlynn Kaiser, PA  Orders Received/Actions taken: EDP notified, no further orders given

## 2019-06-29 NOTE — Progress Notes (Signed)
Pt still in CT

## 2019-06-29 NOTE — ED Notes (Signed)
CRITICAL VALUE ALERT  Critical Value:  VBG pH 7.180, pCO2 81.6, pO2 <31  Date & Time Notied:  06/29/19 1415  Provider Notified: Providence Lanius, PA  Orders Received/Actions taken: EDP notified

## 2019-06-29 NOTE — Progress Notes (Addendum)
Pharmacy Antibiotic Note  Antonio Ramirez is a 59 y.o. male admitted on 06/29/2019 with sepsis.  Pharmacy has been consulted for Vancomycin and Zosyn dosing.  Plan: Vancomycin 1500 mg IV every 48 hours.  Calculated AUC of 434. Zosyn 3.375g IV q8h (4 hour infusion).  Monitor labs, c/s, and vanco level as indicated.  Height: 6' (182.9 cm) Weight: 169 lb 12.1 oz (77 kg) IBW/kg (Calculated) : 77.6  Temp (24hrs), Avg:97.2 F (36.2 C), Min:97.2 F (36.2 C), Max:97.2 F (36.2 C)  Recent Labs  Lab 06/29/19 1329  WBC 10.7*  CREATININE 2.38*  LATICACIDVEN 7.6*    Estimated Creatinine Clearance: 36.4 mL/min (A) (by C-G formula based on SCr of 2.38 mg/dL (H)).    Allergies  Allergen Reactions  . Opana [Oxymorphone Hcl] Hives    hiv    Antimicrobials this admission: Vanco 3/17 >>  Zosyn 3/17 >> 3/17 Cefepime 3/17>>    Microbiology results: 3/17 BCx: pending   Thank you for allowing pharmacy to be a part of this patient's care.  Ramond Craver 06/29/2019 4:11 PM   Addendum  Cefepime ordered tonight in place of Zosyn. It'll be better for nephrotoxicity.   Cefepime 2g IV q12  Onnie Boer, PharmD, [place], AAHIVP, CPP Infectious Disease Pharmacist 06/29/2019 7:18 PM

## 2019-06-29 NOTE — Progress Notes (Signed)
Secure chat with bedside RN Jinny Blossom to clarify fluid intake. She stated that pt receive 1L@1240 , 1L@1323 , and 549mL at 1549. This will cover the 25mL per kg for the Code Sepsis.

## 2019-06-29 NOTE — Progress Notes (Signed)
Pt placed on BIPAP due to abnormal ABG results

## 2019-06-29 NOTE — ED Triage Notes (Signed)
EMS reports pt's girlfriend called ems approx 1120 this morning because she couldn't wake pt.  Reports pt took oxycodone sometime earlier this morning.  EMS says they think pt is bed and wheelchair bound.  Reports pt's bp was 82/53 and cbg 154.  Pt says he took the oxycodone around 0700 or 0800.  Pt c/o chronic back pain.  EMS says pt vomited once pta.

## 2019-06-29 NOTE — ED Provider Notes (Signed)
[facility] EMERGENCY DEPARTMENT Provider Note   CSN: [id number] Arrival date & time: 06/29/19  1213     History Chief Complaint  Patient presents with  . Altered Mental Status    Antonio Ramirez is a 59 y.o. male past no history of CAD, COPD, opioid dependence brought in by EMS for evaluation of altered mental status.  Per EMS, his girlfriend found him at 11:20 AM and found that he was difficult to arouse.  She called EMS.  He got 1 mg of Narcan which did improve his alertness.  Patient does have history of chronic pain medication and has taken oxycodone in about 7 AM.  He denies any other drugs, alcohol use.  EM LEVEL 5 CAVEAT: DUE TO AMS   The history is provided by the patient.       Past Medical History:  Diagnosis Date  . CAD (coronary artery disease)   . COPD (chronic obstructive pulmonary disease) ([place])   . Opioid dependence ([place])   . Smoker     Patient Active Problem List   Diagnosis Date Noted  . Sepsis ([place]) 06/29/2019  . Acute respiratory failure with hypoxia ([place]) 06/01/2019  . COPD (chronic obstructive pulmonary disease) ([place])   . COPD with acute exacerbation ([place])   . Current every day smoker   . Opioid dependence ([place])   . Depression   . Hyperlipidemia   . CAD (coronary artery disease)   . Elevated troponin   . AKI (acute kidney injury) ([place])   . Hyperglycemia   . Tobacco abuse   . Demand ischemia of myocardium ([place])   . Hyperlipidemia LDL goal <70     Past Surgical History:  Procedure Laterality Date  . cardiac bypass    . CARDIAC SURGERY    . COLON SURGERY         Family History  Problem Relation Age of Onset  . COPD Mother     Social History   Tobacco Use  . Smoking status: Current Every Day Smoker    Packs/day: 1.00    Types: Cigarettes  . Smokeless tobacco: Never Used  Substance Use Topics  . Alcohol use: No  . Drug use: No    Home Medications Prior to Admission medications   Medication Sig Start Date End Date  Taking? Authorizing Provider  albuterol (VENTOLIN HFA) 108 (90 Base) MCG/ACT inhaler Inhale 1-2 puffs into the lungs every 6 (six) hours as needed for wheezing or shortness of breath. 06/03/19   Murlean Iba, MD  AMITIZA 24 MCG capsule Take 24 mcg by mouth 2 (two) times daily with a meal. 05/17/19   [provider]  aspirin EC 81 MG tablet Take 1 tablet (81 mg total) by mouth daily. 06/03/19   Johnson, Clanford L, MD  atorvastatin (LIPITOR) 40 MG tablet Take 1 tablet (40 mg total) by mouth daily at 6 PM. 06/03/19   Johnson, Clanford L, MD  calcitRIOL (ROCALTROL) 0.5 MCG capsule Take 0.5 mcg by mouth daily. 03/16/19   [provider]  citalopram (CELEXA) 10 MG tablet Take 10 tablets by mouth daily.  02/02/17   [provider]  gabapentin (NEURONTIN) 600 MG tablet Take 600 mg by mouth at bedtime.  05/27/19   [provider]  isosorbide mononitrate (IMDUR) 30 MG 24 hr tablet Take 30 mg by mouth daily.  01/22/17   [provider]  omeprazole (PRILOSEC) 20 MG capsule Take 20 mg by mouth daily before breakfast. 30 min prior to  breakfast. 04/13/19   [provider]  oxyCODONE (ROXICODONE) 15 MG immediate release tablet Take 15 mg by mouth 3 (three) times daily as needed for pain.  05/27/19   [provider]  polyethylene glycol (MIRALAX / GLYCOLAX) packet Take 17 g by mouth daily. Patient taking differently: Take 17 g by mouth daily as needed for mild constipation or moderate constipation.  02/11/17   Margette Fast, MD  predniSONE (DELTASONE) 20 MG tablet Take 2 PO QAM x5days 06/04/19   Johnson, Clanford L, MD  senna-docusate (SENOKOT-S) 8.6-50 MG tablet Take 2 tablets by mouth at bedtime as needed for mild constipation. 06/03/19   Johnson, Eldridge Dace, MD  SYMBICORT 160-4.5 MCG/ACT inhaler Inhale 2 puffs into the lungs 2 (two) times daily. 04/29/19   [provider]    Allergies    Opana [oxymorphone hcl]  Review of Systems   Review  of Systems  Unable to perform ROS: Mental status change    Physical Exam Updated Vital Signs BP (!) 124/94   Pulse 81   Temp (!) 97.2 F (36.2 C) (Rectal)   Resp 18   Ht 6' (1.829 m)   Wt 77 kg   SpO2 100%   BMI 23.02 kg/m   Physical Exam Vitals and nursing note reviewed.  Constitutional:      Appearance: Normal appearance. He is well-developed.  HENT:     Head: Normocephalic and atraumatic.  Eyes:     General: Lids are normal.     Conjunctiva/sclera: Conjunctivae normal.     Pupils: Pupils are equal, round, and reactive to light.  Cardiovascular:     Rate and Rhythm: Normal rate and regular rhythm.     Pulses:          Radial pulses are 1+ on the right side and 1+ on the left side.       Dorsalis pedis pulses are 1+ on the right side and 1+ on the left side.     Heart sounds: Normal heart sounds. No murmur. No friction rub. No gallop.   Pulmonary:     Effort: Pulmonary effort is normal. Tachypnea present.     Breath sounds: Normal breath sounds.     Comments: Tachypnea noted. Abdominal:     Palpations: Abdomen is soft. Abdomen is not rigid.     Tenderness: There is abdominal tenderness in the suprapubic area. There is no guarding.     Comments: Abdomen soft, nondistended.  Patient flinches with palpation of the suprapubic region.  Musculoskeletal:        General: Normal range of motion.     Cervical back: Full passive range of motion without pain.  Skin:    General: Skin is cool and dry.     Capillary Refill: Capillary refill takes 2 to 3 seconds.     Comments: Slightly mottled skin.   Neurological:     Mental Status: He is alert.     Comments: Alert and oriented x1 Follows some commands intermittently. Moves all extremities spontaneously.  Psychiatric:        Speech: Speech normal.     ED Results / Procedures / Treatments   Labs (all labs ordered are listed, but only abnormal results are displayed) Labs Reviewed  COMPREHENSIVE METABOLIC PANEL -  Abnormal; Notable for the following components:      Result Value   Chloride 93 (*)    Glucose, Bld 167 (*)    BUN 30 (*)    Creatinine, Ser 2.38 (*)  Total Protein 6.4 (*)    Albumin 3.3 (*)    AST 372 (*)    ALT 373 (*)    GFR calc non Af Amer 29 (*)    GFR calc Af Amer 33 (*)    Anion gap 18 (*)    All other components within normal limits  CBC WITH DIFFERENTIAL/PLATELET - Abnormal; Notable for the following components:   WBC 10.7 (*)    HCT 54.0 (*)    MCV 108.9 (*)    RDW 16.3 (*)    Neutro Abs 8.9 (*)    All other components within normal limits  LACTIC ACID, PLASMA - Abnormal; Notable for the following components:   Lactic Acid, Venous 7.6 (*)    All other components within normal limits  URINALYSIS, ROUTINE W REFLEX MICROSCOPIC - Abnormal; Notable for the following components:   APPearance CLOUDY (*)    Hgb urine dipstick SMALL (*)    Bilirubin Urine SMALL (*)    Protein, ur 100 (*)    Bacteria, UA RARE (*)    All other components within normal limits  RAPID URINE DRUG SCREEN, HOSP PERFORMED - Abnormal; Notable for the following components:   Opiates POSITIVE (*)    Tetrahydrocannabinol POSITIVE (*)    All other components within normal limits  BLOOD GAS, VENOUS - Abnormal; Notable for the following components:   pH, Ven 7.180 (*)    pCO2, Ven 81.6 (*)    pO2, Ven <31.0 (*)    All other components within normal limits  BRAIN NATRIURETIC PEPTIDE - Abnormal; Notable for the following components:   B Natriuretic Peptide 1,099.0 (*)    All other components within normal limits  LACTIC ACID, PLASMA - Abnormal; Notable for the following components:   Lactic Acid, Venous 3.2 (*)    All other components within normal limits  ACETAMINOPHEN LEVEL - Abnormal; Notable for the following components:   Acetaminophen (Tylenol), Serum <10 (*)    All other components within normal limits  SALICYLATE LEVEL - Abnormal; Notable for the following components:   Salicylate Lvl  <2.7 (*)    All other components within normal limits  BLOOD GAS, ARTERIAL - Abnormal; Notable for the following components:   pO2, Arterial 72.1 (*)    Acid-Base Excess 3.3 (*)    All other components within normal limits  TROPONIN I (HIGH SENSITIVITY) - Abnormal; Notable for the following components:   Troponin I (High Sensitivity) 1,059 (*)    All other components within normal limits  TROPONIN I (HIGH SENSITIVITY) - Abnormal; Notable for the following components:   Troponin I (High Sensitivity) 1,566 (*)    All other components within normal limits  RESPIRATORY PANEL BY RT PCR (FLU A&B, COVID)  CULTURE, BLOOD (ROUTINE X 2)  CULTURE, BLOOD (ROUTINE X 2)  LIPASE, BLOOD  AMMONIA  ETHANOL  OSMOLALITY  VOLATILES,BLD-ACETONE,ETHANOL,ISOPROP,METHANOL  ETHYLENE GLYCOL  POC SARS CORONAVIRUS 2 AG -  ED    EKG None  Radiology CT ABDOMEN PELVIS WO CONTRAST  Result Date: 06/29/2019 CLINICAL DATA:  Generalized abdominal pain. EXAM: CT ABDOMEN AND PELVIS WITHOUT CONTRAST TECHNIQUE: Multidetector CT imaging of the abdomen and pelvis was performed following the standard protocol without IV contrast. COMPARISON:  None. FINDINGS: Lower chest: No acute abnormality. Hepatobiliary: No focal liver abnormality is seen. No gallstones, gallbladder wall thickening, or biliary dilatation. Pancreas: Unremarkable. No pancreatic ductal dilatation or surrounding inflammatory changes. Spleen: Normal in size without focal abnormality. Adrenals/Urinary Tract: Adrenal glands are unremarkable. Kidneys  are normal, without renal calculi, focal lesion, or hydronephrosis. Bladder is unremarkable. Stomach/Bowel: Mild gastric distention is noted. Large amount of stool seen throughout the colon. No evidence of bowel obstruction or inflammation is noted. The appendix is not visualized. Vascular/Lymphatic: Aortic atherosclerosis. No enlarged abdominal or pelvic lymph nodes. Reproductive: Prostate is unremarkable. Other: No  abdominal wall hernia or abnormality. No abdominopelvic ascites. Musculoskeletal: No acute or significant osseous findings. IMPRESSION: 1. Mild gastric distention is noted. 2. Large amount of stool seen throughout the colon. 3. Aortic atherosclerosis. 4. No other abnormality seen in the abdomen or pelvis. Aortic Atherosclerosis (ICD10-I70.0). Electronically Signed   By: Marijo Conception M.D.   On: 06/29/2019 16:42   CT Head Wo Contrast  Result Date: 06/29/2019 CLINICAL DATA:  Altered mental status with unclear cause. EXAM: CT HEAD WITHOUT CONTRAST TECHNIQUE: Contiguous axial images were obtained from the base of the skull through the vertex without intravenous contrast. COMPARISON:  06/01/2019 FINDINGS: Brain: No evidence of acute infarction, hemorrhage, hydrocephalus, extra-axial collection or mass lesion/mass effect. Again noted is atrophy and chronic microvascular ischemic changes, greater than expected for the patient's stated age. Again noted are areas of encephalomalacia involving the right temporal lobe. Vascular: No hyperdense vessel or unexpected calcification. Skull: Normal. Negative for fracture or focal lesion. Sinuses/Orbits: There is some mucosal thickening of the ethmoid air cells. There is an air-fluid level within the left maxillary sinus. The remaining paranasal sinuses and mastoid air cells are essentially clear. Other: None. IMPRESSION: 1. No acute intracranial abnormality. 2. Chronic findings as detailed above. Electronically Signed   By: Constance Holster M.D.   On: 06/29/2019 16:40   DG Chest Portable 1 View  Result Date: 06/29/2019 CLINICAL DATA:  59 year old male status post endotracheal and enteric tube placement. EXAM: PORTABLE CHEST 1 VIEW COMPARISON:  Earlier chest radiograph dated 06/29/2019. FINDINGS: Endotracheal tube with tip approximately 4.5 cm above the carina. The side port of the enteric tube is in the distal esophagus and tip just above the gastroesophageal junction.  Recommend further advancing of the tube by at least additional 15 cm. No significant interval change in the appearance of the lungs or cardiomediastinal silhouette since the earlier radiograph. IMPRESSION: 1. Interval placement of an endotracheal tube with tip above the carina. 2. Enteric tube with tip just above the gastroesophageal junction. Recommend further advancing into the stomach. Electronically Signed   By: Anner Crete M.D.   On: 06/29/2019 19:20   DG Chest Portable 1 View  Result Date: 06/29/2019 CLINICAL DATA:  Altered mental status. EXAM: PORTABLE CHEST 1 VIEW COMPARISON:  Radiograph 06/01/2019 FINDINGS: Chronic hyperinflation. Increased bronchial thickening above baseline. Post median sternotomy with unchanged heart size and mediastinal contours. Aortic atherosclerosis. No pulmonary edema, large pleural effusion or pneumothorax. No acute osseous abnormalities are seen. IMPRESSION: Increased bronchial thickening above baseline, suggesting acute bronchitis. Chronic hyperinflation. Aortic Atherosclerosis (ICD10-I70.0). Electronically Signed   By: Keith Rake M.D.   On: 06/29/2019 13:12    Procedures .Critical Care Performed by: Volanda Napoleon, PA-C Authorized by: Volanda Napoleon, PA-C   Critical care provider statement:    Critical care time (minutes):  45   Critical care was necessary to treat or prevent imminent or life-threatening deterioration of the following conditions:  Sepsis and shock   Critical care was time spent personally by me on the following activities:  Discussions with consultants, evaluation of patient's response to treatment, examination of patient, ordering and performing treatments and interventions, ordering and review of  laboratory studies, ordering and review of radiographic studies, pulse oximetry, re-evaluation of patient's condition, obtaining history from patient or surrogate and review of old charts Procedure Name: Intubation Date/Time:  06/29/2019 6:08 PM Performed by: Volanda Napoleon, PA-C Pre-anesthesia Checklist: Patient identified Oxygen Delivery Method: Ambu bag Preoxygenation: Pre-oxygenation with 100% oxygen Induction Type: Rapid sequence Ventilation: Mask ventilation without difficulty Laryngoscope Size: Mac and 3 Tube size: 7.0 mm Number of attempts: 2 Airway Equipment and Method: Stylet Placement Confirmation: ETT inserted through vocal cords under direct vision,  Positive ETCO2 and Breath sounds checked- equal and bilateral Secured at: 24 cm Tube secured with: Tape Dental Injury: Teeth and Oropharynx as per pre-operative assessment     .Central Line  Date/Time: 06/29/2019 6:45 PM Performed by: Volanda Napoleon, PA-C Authorized by: Volanda Napoleon, PA-C   Consent:    Consent obtained:  Verbal and emergent situation   Alternatives discussed:  No treatment Pre-procedure details:    Hand hygiene: Hand hygiene performed prior to insertion     Sterile barrier technique: All elements of maximal sterile technique followed     Skin preparation:  ChloraPrep Procedure details:    Location:  R femoral   Patient position:  Flat   Procedural supplies:  Triple lumen   Catheter size:  7 Fr   Landmarks identified: yes     Ultrasound guidance: no     Number of attempts:  2   Successful placement: yes   Post-procedure details:    Post-procedure:  Dressing applied   Assessment:  Blood return through all ports   Patient tolerance of procedure:  Tolerated well, no immediate complications   (including critical care time)  Medications Ordered in ED Medications  vancomycin (VANCOREADY) IVPB 1500 mg/300 mL (has no administration in time range)  enoxaparin (LOVENOX) injection 30 mg (30 mg Subcutaneous Given 06/29/19 2048)  propofol (DIPRIVAN) 1000 MG/100ML infusion (45 mcg/kg/min  77 kg Intravenous Rate/Dose Change 06/29/19 2010)  ceFEPIme (MAXIPIME) 2 g in sodium chloride 0.9 % 100 mL IVPB (has no  administration in time range)  norepinephrine (LEVOPHED) 40m in 2533mpremix infusion (11 mcg/min Intravenous New Bag/Given 06/29/19 2009)  fomepizole (ANTIZOL) 1,160 mg in sodium chloride 0.9 % 100 mL IVPB (1,160 mg Intravenous New Bag/Given 06/29/19 2118)  fomepizole (ANTIZOL) 770 mg in sodium chloride 0.9 % 100 mL IVPB (has no administration in time range)  naloxone ([facility] Inc[facility] 1 mg (1 mg Intravenous Given 06/29/19 1240)  0.9 %  sodium chloride infusion ( Intravenous Stopped 06/29/19 1527)  sodium chloride 0.9 % bolus 1,000 mL (0 mLs Intravenous Stopped 06/29/19 1527)  naloxone (NARCAN) injection 0.4 mg (0.4 mg Intravenous Given 06/29/19 1258)  sodium chloride 0.9 % bolus 500 mL ( Intravenous Stopped 06/29/19 1708)  piperacillin-tazobactam (ZOSYN) IVPB 3.375 g ( Intravenous Stopped 06/29/19 1615)  vancomycin (VANCOREADY) IVPB 1750 mg/350 mL ( Intravenous Stopped 06/29/19 1818)  etomidate (AMIDATE) injection 20 mg (10 mg Intravenous Given by Other 06/29/19 1800)  succinylcholine (ANECTINE) injection 100 mg (100 mg Intravenous Given by Other 06/29/19 1801)  midazolam (VERSED) 5 MG/5ML injection 4 mg (4 mg Intravenous Given by Other 06/29/19 1827)  ceFEPIme (MAXIPIME) 2 g in sodium chloride 0.9 % 100 mL IVPB (0 g Intravenous Stopped 06/29/19 2121)    ED Course  I have reviewed the triage vital signs and the nursing notes.  Pertinent labs & imaging results that were available during my care of the patient were reviewed by me and considered in my medical decision  making (see chart for details).    MDM Rules/Calculators/A&P                      60 year old male who presents for evaluation of altered mental status.  His girlfriend is at bedside who provides much of the history.  She reports that at about 1120 this morning, she tried to wake him up and was having difficulty.  She states that he does have oxycodone that he takes for chronic pain.  Girlfriend does not believe that he took any more than  he normally does.  She also does not think that he did any alcohol or any other medications.  On initial ED arrival, patient is hypotensive.  He is slightly tachycardic.  He is afebrile.  On exam, he can tell me his name but does not know where he is at or what year it is.  He minimally follows commands.  Question if this is infectious process versus drug related.  Plan to check labs, CT head, chest x-ray.  UDS is positive for opiates, marijuana.  Troponin is elevated 1,059.  Lactic is elevated at 7.6.  Initial rapid Covid is negative.  CMP shows AKI with BUN of 30, creatinine of 2.38.  His baseline creatinine is 0.8.  He does have some mild elevation in his liver functions with AST of 372, ALT of 373.  Alk phos and total bili without acute abnormalities.  He has an anion gap of 18.  VBG shows pH of 7.1, PCO2 of 81.6.  CBC shows slight leukocytosis of 10.7.  BMP shows elevation of 1099.  UA shows no evidence of infectious etiology.  Ammonia is 29.  At this time, concern for septic shock.  Patient has received 2 L.  His 30 cc/kg total would be 2300 cc.  We will on additional 500 L.  Will be careful not to fluid overload him.  On spectrum antibiotics, including Vanco and Zosyn ordered for unknown source.  Chest x-ray shows increased bronchial thickening suggestive of acute bronchitis.  CT head shows no acute intracranial abnormalities.  CT abdomen shows mild gastric distention, large amount of stool.  No acute other abnormalities that would explain his symptoms.  At this time, given concern for lactic acidosis, septic shock, altered mental status, will plan for admission.  BiPAP was ordered but patient had nausea/vomiting so unable to tolerate.  Discussed patient with Dr. Anastasia Pall (hospitalist) who recommends touching base with critical care to see if he would meet admission criteria.  I discussed with Dr. Tamala Julian (critical care).  He will accept patient for admission.  He would like me to place a critical  care bed for [facility] long.  He recommends intubating given that patient has been vomiting and altered.   Intubation as documented above.  Central line as documented above.  Discussed patient with Dr. Gilman Schmidt (Cardiology) after review of patient's chart, he feels that this is most likely demand ischemia.  No indication for acute cardiac intervention at this time.  Cards will plan to consult as needed once he gets transferred over.  I discussed with critical care NP.  She recommends starting patient on fomepizole for possible ethylene glycol ingestion.  I discussed with pharmacy who will give a loading dose and start the dose.  Additionally, discussed with Dr. Posey Pronto (nephrology) who will come evaluate patient in ED.  He has note for further recommendation.  Portions of this note were generated with [employer]. Dictation errors may occur  despite best attempts at proofreading.   Final Clinical Impression(s) / ED Diagnoses Final diagnoses:  Sepsis, due to unspecified organism, unspecified whether acute organ dysfunction present ([place])  Altered mental status, unspecified altered mental status type  Shock ([place])  AKI (acute kidney injury) ([place])  Elevated troponin    Rx / DC Orders ED Discharge Orders    None       Desma Mcgregor 06/29/19 2141    Noemi Chapel, MD 06/30/19 1525

## 2019-06-29 NOTE — Procedures (Signed)
Intubation Procedure Note Antonio Ramirez [id number] March 08, 1961  Procedure: Intubation Indications: Airway protection and maintenance  Procedure Details Consent: Risks of procedure as well as the alternatives and risks of each were explained to the (patient/caregiver).  Consent for procedure obtained. Time Out: Verified patient identification, verified procedure, site/side was marked, verified correct patient position, special equipment/implants available, medications/allergies/relevent history reviewed, required imaging and test results available.  Performed  Maximum sterile technique was used including gloves.  MAC 4    Evaluation Hemodynamic Status: BP stable throughout; O2 sats: stable throughout Patient's Current Condition: stable Complications: No apparent complications Patient did tolerate procedure well. Chest X-ray ordered to verify placement.  CXR: pending.   Antonio Ramirez 06/29/2019

## 2019-06-29 NOTE — Progress Notes (Signed)
MEDICATION RELATED CONSULT NOTE - INITIAL   Pharmacy Consult for fomepizole Indication: possible ethylene glycol toxicity   Allergies  Allergen Reactions  . Opana [Oxymorphone Hcl] Hives    hiv    Patient Measurements: Height: 6' (182.9 cm) Weight: 169 lb 12.1 oz (77 kg) IBW/kg (Calculated) : 77.6 Adjusted Body Weight:   Vital Signs: Temp: 97.2 F (36.2 C) (03/17 1243) Temp Source: Rectal (03/17 1243) BP: 128/86 (03/17 2030) Pulse Rate: 78 (03/17 2030) Intake/Output from previous day: No intake/output data recorded. Intake/Output from this shift: Total I/O In: 169.5 [IV Piggyback:169.5] Out: -   Labs: Recent Labs    06/29/19 1329  WBC 10.7*  HGB 16.5  HCT 54.0*  PLT 331  CREATININE 2.38*  ALBUMIN 3.3*  PROT 6.4*  AST 372*  ALT 373*  ALKPHOS 97  BILITOT 0.6   Estimated Creatinine Clearance: 36.4 mL/min (A) (by C-G formula based on SCr of 2.38 mg/dL (H)).   Microbiology: Recent Results (from the past 720 hour(s))  Respiratory Panel by RT PCR (Flu A&B, Covid) - Nasopharyngeal Swab     Status: None   Collection Time: 06/01/19  1:56 AM   Specimen: Nasopharyngeal Swab  Result Value Ref Range Status   SARS Coronavirus 2 by RT PCR NEGATIVE NEGATIVE Final    Comment: (NOTE) SARS-CoV-2 target nucleic acids are NOT DETECTED. The SARS-CoV-2 RNA is generally detectable in upper respiratoy specimens during the acute phase of infection. The lowest concentration of SARS-CoV-2 viral copies this assay can detect is 131 copies/mL. A negative result does not preclude SARS-Cov-2 infection and should not be used as the sole basis for treatment or other patient management decisions. A negative result may occur with  improper specimen collection/handling, submission of specimen other than nasopharyngeal swab, presence of viral mutation(s) within the areas targeted by this assay, and inadequate number of viral copies (<131 copies/mL). A negative result must be combined with  clinical observations, patient history, and epidemiological information. The expected result is Negative. Fact Sheet for Patients:  PinkCheek.be Fact Sheet for Healthcare Providers:  GravelBags.it This test is not yet ap proved or cleared by the [place] FDA and  has been authorized for detection and/or diagnosis of SARS-CoV-2 by FDA under an Emergency Use Authorization (EUA). This EUA will remain  in effect (meaning this test can be used) for the duration of the COVID-19 declaration under Section 564(b)(1) of the Act, 21 U.S.C. section 360bbb-3(b)(1), unless the authorization is terminated or revoked sooner.    Influenza A by PCR NEGATIVE NEGATIVE Final   Influenza B by PCR NEGATIVE NEGATIVE Final    Comment: (NOTE) The Xpert Xpress SARS-CoV-2/FLU/RSV assay is intended as an aid in  the diagnosis of influenza from Nasopharyngeal swab specimens and  should not be used as a sole basis for treatment. Nasal washings and  aspirates are unacceptable for Xpert Xpress SARS-CoV-2/FLU/RSV  testing. Fact Sheet for Patients: PinkCheek.be Fact Sheet for Healthcare Providers: GravelBags.it This test is not yet approved or cleared by the [place] FDA and  has been authorized for detection and/or diagnosis of SARS-CoV-2 by  FDA under an Emergency Use Authorization (EUA). This EUA will remain  in effect (meaning this test can be used) for the duration of the  Covid-19 declaration under Section 564(b)(1) of the Act, 21  U.S.C. section 360bbb-3(b)(1), unless the authorization is  terminated or revoked. Performed at [facility], [address] [place], [place] [address]   MRSA PCR Screening     Status:  None   Collection Time: 06/01/19  8:59 AM   Specimen: Nasal Mucosa; Nasopharyngeal  Result Value Ref Range Status   MRSA by PCR NEGATIVE NEGATIVE Final    Comment:         The GeneXpert MRSA Assay (FDA approved for NASAL specimens only), is one component of a comprehensive MRSA colonization surveillance program. It is not intended to diagnose MRSA infection nor to guide or monitor treatment for MRSA infections. Performed at [facility], [address], [place], [place] [address]   Respiratory Panel by RT PCR (Flu A&B, Covid) - Nasopharyngeal Swab     Status: None   Collection Time: 06/29/19  2:52 PM   Specimen: Nasopharyngeal Swab  Result Value Ref Range Status   SARS Coronavirus 2 by RT PCR NEGATIVE NEGATIVE Final    Comment: (NOTE) SARS-CoV-2 target nucleic acids are NOT DETECTED. The SARS-CoV-2 RNA is generally detectable in upper respiratoy specimens during the acute phase of infection. The lowest concentration of SARS-CoV-2 viral copies this assay can detect is 131 copies/mL. A negative result does not preclude SARS-Cov-2 infection and should not be used as the sole basis for treatment or other patient management decisions. A negative result may occur with  improper specimen collection/handling, submission of specimen other than nasopharyngeal swab, presence of viral mutation(s) within the areas targeted by this assay, and inadequate number of viral copies (<131 copies/mL). A negative result must be combined with clinical observations, patient history, and epidemiological information. The expected result is Negative. Fact Sheet for Patients:  PinkCheek.be Fact Sheet for Healthcare Providers:  GravelBags.it This test is not yet ap proved or cleared by the [place] FDA and  has been authorized for detection and/or diagnosis of SARS-CoV-2 by FDA under an Emergency Use Authorization (EUA). This EUA will remain  in effect (meaning this test can be used) for the duration of the COVID-19 declaration under Section 564(b)(1) of the Act, 21 U.S.C. section 360bbb-3(b)(1), unless the  authorization is terminated or revoked sooner.    Influenza A by PCR NEGATIVE NEGATIVE Final   Influenza B by PCR NEGATIVE NEGATIVE Final    Comment: (NOTE) The Xpert Xpress SARS-CoV-2/FLU/RSV assay is intended as an aid in  the diagnosis of influenza from Nasopharyngeal swab specimens and  should not be used as a sole basis for treatment. Nasal washings and  aspirates are unacceptable for Xpert Xpress SARS-CoV-2/FLU/RSV  testing. Fact Sheet for Patients: PinkCheek.be Fact Sheet for Healthcare Providers: GravelBags.it This test is not yet approved or cleared by the [place] FDA and  has been authorized for detection and/or diagnosis of SARS-CoV-2 by  FDA under an Emergency Use Authorization (EUA). This EUA will remain  in effect (meaning this test can be used) for the duration of the  Covid-19 declaration under Section 564(b)(1) of the Act, 21  U.S.C. section 360bbb-3(b)(1), unless the authorization is  terminated or revoked. Performed at [facility], [address], [place], [place] [address]   Blood culture (routine x 2)     Status: None (Preliminary result)   Collection Time: 06/29/19  3:28 PM   Specimen: Left Antecubital; Blood  Result Value Ref Range Status   Specimen Description LEFT ANTECUBITAL  Final   Special Requests   Final    BOTTLES DRAWN AEROBIC AND ANAEROBIC Blood Culture adequate volume Performed at [facility], [address], [place], [place] [address]    Culture PENDING  Incomplete   Report Status PENDING  Incomplete  Blood culture (routine x  2)     Status: None (Preliminary result)   Collection Time: 06/29/19  3:33 PM   Specimen: BLOOD LEFT HAND  Result Value Ref Range Status   Specimen Description BLOOD LEFT HAND  Final   Special Requests   Final    BOTTLES DRAWN AEROBIC AND ANAEROBIC Blood Culture results may not be optimal due to an inadequate volume of blood received in culture  bottles Performed at [facility], [address], [place], [place] [address]    Culture PENDING  Incomplete   Report Status PENDING  Incomplete    Medical History: Past Medical History:  Diagnosis Date  . CAD (coronary artery disease)   . COPD (chronic obstructive pulmonary disease) ([place])   . Opioid dependence ([place])   . Smoker     Medications:  (Not in a hospital admission)  Scheduled:  . enoxaparin (LOVENOX) injection  30 mg Subcutaneous Q24H   Infusions:  . ceFEPIme (MAXIPIME) 2 GM IVPB (Mini-Bag Plus) 2 g (06/29/19 2047)  . [START ON 06/30/2019] ceFEPime (MAXIPIME) IV    . fomepizole (ANTIZOL) IV    . [START ON 06/30/2019] fomepizole (ANTIZOL) IV    . norepinephrine (LEVOPHED) Adult infusion 11 mcg/min (06/29/19 2009)  . propofol (DIPRIVAN) infusion 45 mcg/kg/min (06/29/19 2010)  . [START ON 07/01/2019] vancomycin      Assessment: Pt was seen and admitted for AM. His UDS was positive for opiates and marijuana. Concern for shock and lactic acidosis. Also concern for possible ethylene glycol toxicity and fomepizole was ordered. Will start it tonight until he is tx to [facility] ICU.   Plan:   Fomepizole 1160mg  IV x1 then 770mg  IV q12 x 4 doses F/u to see if needed  Onnie Boer, PharmD, BCIDP, AAHIVP, CPP Infectious Disease Pharmacist 06/29/2019 9:02 PM

## 2019-06-29 NOTE — Consult Note (Signed)
Reason for Consult: Concern for toxic alcohol ingestion, respiratory acidosis Referring Physician: Noemi Chapel, MD  HPI:  59 year old Caucasian man with past medical history significant for chronic obstructive lung disease with ongoing tobacco use, coronary artery disease status post CABG, chronic back pain with opioid dependence and seemingly normal renal function at baseline (creatinine 0.9).  He was brought into the emergency room by EMS after found to be having altered mental status by his girlfriend at around 11:20 AM today-had transient improvement with naloxone.  His girlfriend reports that he has not taken any alcohol or other drugs.  He was found to be septic with hypotension, tachycardia and lactic acidosis with a significant respiratory acidosis (pH 7.18 with PCO2 82).  He was also noted to have elevated troponin and concern is raised regarding possible toxic alcohol ingestion based on his acidosis and the presence of calcium oxalate crystals in urinalysis.  He appears to have had a similar presentation/admission last month.  When seen, he is intubated and unable to provide additional history.  Past Medical History:  Diagnosis Date  . CAD (coronary artery disease)   . COPD (chronic obstructive pulmonary disease) ([place])   . Opioid dependence ([place])   . Smoker     Past Surgical History:  Procedure Laterality Date  . cardiac bypass    . CARDIAC SURGERY    . COLON SURGERY      Family History  Problem Relation Age of Onset  . COPD Mother     Social History:  reports that he has been smoking cigarettes. He has been smoking about 1.00 pack per day. He has never used smokeless tobacco. He reports that he does not drink alcohol or use drugs.  Allergies:  Allergies  Allergen Reactions  . Opana [Oxymorphone Hcl] Hives    hiv    Medications:  Scheduled: . enoxaparin (LOVENOX) injection  30 mg Subcutaneous Q24H    BMP Latest Ref Rng & Units 06/29/2019 06/03/2019 06/02/2019   Glucose 70 - 99 mg/dL 167(H) 93 108(H)  BUN 6 - 20 mg/dL 30(H) 33(H) 35(H)  Creatinine 0.61 - 1.24 mg/dL 2.38(H) 0.89 0.91  Sodium 135 - 145 mmol/L 138 139 136  Potassium 3.5 - 5.1 mmol/L 4.4 4.6 4.5  Chloride 98 - 111 mmol/L 93(L) 98 96(L)  CO2 22 - 32 mmol/L 27 32 30  Calcium 8.9 - 10.3 mg/dL 10.0 10.7(H) 10.1   CBC Latest Ref Rng & Units 06/29/2019 06/03/2019 06/02/2019  WBC 4.0 - 10.5 K/uL 10.7(H) 11.5(H) 9.6  Hemoglobin 13.0 - 17.0 g/dL 16.5 17.1(H) 15.8  Hematocrit 39.0 - 52.0 % 54.0(H) 52.6(H) 49.2  Platelets 150 - 400 K/uL 331 256 223   Urinalysis    Component Value Date/Time   COLORURINE YELLOW 06/29/2019 1546   APPEARANCEUR CLOUDY (A) 06/29/2019 1546   LABSPEC 1.024 06/29/2019 1546   PHURINE 5.0 06/29/2019 1546   GLUCOSEU NEGATIVE 06/29/2019 1546   HGBUR SMALL (A) 06/29/2019 1546   BILIRUBINUR SMALL (A) 06/29/2019 1546   KETONESUR NEGATIVE 06/29/2019 1546   PROTEINUR 100 (A) 06/29/2019 1546   NITRITE NEGATIVE 06/29/2019 1546   LEUKOCYTESUR NEGATIVE 06/29/2019 1546    CT ABDOMEN PELVIS WO CONTRAST  Result Date: 06/29/2019 CLINICAL DATA:  Generalized abdominal pain. EXAM: CT ABDOMEN AND PELVIS WITHOUT CONTRAST TECHNIQUE: Multidetector CT imaging of the abdomen and pelvis was performed following the standard protocol without IV contrast. COMPARISON:  None. FINDINGS: Lower chest: No acute abnormality. Hepatobiliary: No focal liver abnormality is seen. No gallstones, gallbladder wall thickening,  or biliary dilatation. Pancreas: Unremarkable. No pancreatic ductal dilatation or surrounding inflammatory changes. Spleen: Normal in size without focal abnormality. Adrenals/Urinary Tract: Adrenal glands are unremarkable. Kidneys are normal, without renal calculi, focal lesion, or hydronephrosis. Bladder is unremarkable. Stomach/Bowel: Mild gastric distention is noted. Large amount of stool seen throughout the colon. No evidence of bowel obstruction or inflammation is noted. The appendix  is not visualized. Vascular/Lymphatic: Aortic atherosclerosis. No enlarged abdominal or pelvic lymph nodes. Reproductive: Prostate is unremarkable. Other: No abdominal wall hernia or abnormality. No abdominopelvic ascites. Musculoskeletal: No acute or significant osseous findings. IMPRESSION: 1. Mild gastric distention is noted. 2. Large amount of stool seen throughout the colon. 3. Aortic atherosclerosis. 4. No other abnormality seen in the abdomen or pelvis. Aortic Atherosclerosis (ICD10-I70.0). Electronically Signed   By: Marijo Conception M.D.   On: 06/29/2019 16:42   CT Head Wo Contrast  Result Date: 06/29/2019 CLINICAL DATA:  Altered mental status with unclear cause. EXAM: CT HEAD WITHOUT CONTRAST TECHNIQUE: Contiguous axial images were obtained from the base of the skull through the vertex without intravenous contrast. COMPARISON:  06/01/2019 FINDINGS: Brain: No evidence of acute infarction, hemorrhage, hydrocephalus, extra-axial collection or mass lesion/mass effect. Again noted is atrophy and chronic microvascular ischemic changes, greater than expected for the patient's stated age. Again noted are areas of encephalomalacia involving the right temporal lobe. Vascular: No hyperdense vessel or unexpected calcification. Skull: Normal. Negative for fracture or focal lesion. Sinuses/Orbits: There is some mucosal thickening of the ethmoid air cells. There is an air-fluid level within the left maxillary sinus. The remaining paranasal sinuses and mastoid air cells are essentially clear. Other: None. IMPRESSION: 1. No acute intracranial abnormality. 2. Chronic findings as detailed above. Electronically Signed   By: Constance Holster M.D.   On: 06/29/2019 16:40   DG Chest Portable 1 View  Result Date: 06/29/2019 CLINICAL DATA:  59 year old male status post endotracheal and enteric tube placement. EXAM: PORTABLE CHEST 1 VIEW COMPARISON:  Earlier chest radiograph dated 06/29/2019. FINDINGS: Endotracheal tube  with tip approximately 4.5 cm above the carina. The side port of the enteric tube is in the distal esophagus and tip just above the gastroesophageal junction. Recommend further advancing of the tube by at least additional 15 cm. No significant interval change in the appearance of the lungs or cardiomediastinal silhouette since the earlier radiograph. IMPRESSION: 1. Interval placement of an endotracheal tube with tip above the carina. 2. Enteric tube with tip just above the gastroesophageal junction. Recommend further advancing into the stomach. Electronically Signed   By: Anner Crete M.D.   On: 06/29/2019 19:20   DG Chest Portable 1 View  Result Date: 06/29/2019 CLINICAL DATA:  Altered mental status. EXAM: PORTABLE CHEST 1 VIEW COMPARISON:  Radiograph 06/01/2019 FINDINGS: Chronic hyperinflation. Increased bronchial thickening above baseline. Post median sternotomy with unchanged heart size and mediastinal contours. Aortic atherosclerosis. No pulmonary edema, large pleural effusion or pneumothorax. No acute osseous abnormalities are seen. IMPRESSION: Increased bronchial thickening above baseline, suggesting acute bronchitis. Chronic hyperinflation. Aortic Atherosclerosis (ICD10-I70.0). Electronically Signed   By: Keith Rake M.D.   On: 06/29/2019 13:12    Review of Systems  Unable to perform ROS: Intubated   Blood pressure 128/86, pulse 78, temperature (!) 97.2 F (36.2 C), temperature source Rectal, resp. rate 16, height 6' (1.829 m), weight 77 kg, SpO2 99 %. Physical Exam  Vitals reviewed. Constitutional: He appears well-developed.  Appears chronically ill  HENT:  Head: Normocephalic and atraumatic.  Intubated  Eyes:  Conjunctivae are normal. No scleral icterus.  Neck: JVD present.  Cardiovascular: Normal rate, regular rhythm and normal heart sounds.  No murmur heard. Respiratory: Effort normal and breath sounds normal. He has no wheezes. He has no rales.  GI: Bowel sounds are  normal. There is no abdominal tenderness. There is no rebound.  Musculoskeletal:        General: No edema.     Cervical back: Normal range of motion and neck supple.  Skin: Skin is warm and dry. No rash noted. There is pallor.    Assessment/Plan: 1.  Acute kidney injury: This appears to be possibly hemodynamically mediated plus/minus evolution to ATN from what appears to be sepsis versus SIRS.  The clinical index of suspicion for toxic alcohol ingestion is low; he does have an anion gap of 22 with mixed respiratory acidosis and metabolic acidosis (based on previous bicarbonate levels that have been higher).  I will continue to follow along closely for assessment of his serum osmolality because of presence of an osmolar gap would strengthen the indication to undertake dialysis for toxic alcohol ingestion.  He has already been started on fomepizole per EDP/CCM.  Recommend balanced crystalloid such as lactated Ringer's at 100 cc/hour. 2.  Respiratory acidosis: With history of COPD and mild leukocytosis-suspect part of his altered mental status may be due to carbon dioxide narcosis.  Status post intubation with ventilator support per CCM.  His respiratory acidosis has been rapidly corrected with ventilator management.  Toxic alcohol ingestion typically produces metabolic acidosis with respiratory alkalosis however in his situation, may indeed have been skewed by opioids. 3.  Elevated troponin: Rising trend noted on cycling troponin-maintain high index of suspicion for ACS versus demand ischemia. 4.  Sepsis/SIRS: Ongoing source identification with broad-spectrum antibiotic therapy along with initiation of fomepizole for possible toxic alcohol ingestion.  Awaiting transfer to [facility] long ICU.  Elevated LFTs likely from shock liver.  Scotti Motter K. 06/29/2019, 9:15 PM

## 2019-06-30 ENCOUNTER — Inpatient Hospital Stay (HOSPITAL_COMMUNITY): Payer: Medicare Other

## 2019-06-30 DIAGNOSIS — Z9911 Dependence on respirator [ventilator] status: Secondary | ICD-10-CM

## 2019-06-30 DIAGNOSIS — J9601 Acute respiratory failure with hypoxia: Secondary | ICD-10-CM

## 2019-06-30 DIAGNOSIS — L8990 Pressure ulcer of unspecified site, unspecified stage: Secondary | ICD-10-CM | POA: Insufficient documentation

## 2019-06-30 DIAGNOSIS — J9602 Acute respiratory failure with hypercapnia: Secondary | ICD-10-CM

## 2019-06-30 DIAGNOSIS — T40604A Poisoning by unspecified narcotics, undetermined, initial encounter: Secondary | ICD-10-CM

## 2019-06-30 LAB — BASIC METABOLIC PANEL
Anion gap: 10 (ref 5–15)
Anion gap: 12 (ref 5–15)
BUN: 34 mg/dL — ABNORMAL HIGH (ref 6–20)
BUN: 35 mg/dL — ABNORMAL HIGH (ref 6–20)
CO2: 27 mmol/L (ref 22–32)
CO2: 28 mmol/L (ref 22–32)
Calcium: 9 mg/dL (ref 8.9–10.3)
Calcium: 9.4 mg/dL (ref 8.9–10.3)
Chloride: 100 mmol/L (ref 98–111)
Chloride: 102 mmol/L (ref 98–111)
Creatinine, Ser: 1.56 mg/dL — ABNORMAL HIGH (ref 0.61–1.24)
Creatinine, Ser: 1.84 mg/dL — ABNORMAL HIGH (ref 0.61–1.24)
GFR calc Af Amer: 45 mL/min — ABNORMAL LOW (ref >60)
GFR calc Af Amer: 56 mL/min — ABNORMAL LOW (ref >60)
GFR calc non Af Amer: 39 mL/min — ABNORMAL LOW (ref >60)
GFR calc non Af Amer: 48 mL/min — ABNORMAL LOW (ref >60)
Glucose, Bld: 103 mg/dL — ABNORMAL HIGH (ref 70–99)
Glucose, Bld: 98 mg/dL (ref 70–99)
Potassium: 4.1 mmol/L (ref 3.5–5.1)
Potassium: 4.1 mmol/L (ref 3.5–5.1)
Sodium: 139 mmol/L (ref 135–145)
Sodium: 140 mmol/L (ref 135–145)

## 2019-06-30 LAB — ETHYLENE GLYCOL: Ethylene Glycol Lvl: <5 mg/dL

## 2019-06-30 LAB — CBC
HCT: 51.4 % (ref 39.0–52.0)
HCT: 50.2 % (ref 39.0–52.0)
Hemoglobin: 16.7 g/dL (ref 13.0–17.0)
Hemoglobin: 16.3 g/dL (ref 13.0–17.0)
MCH: 32.9 pg (ref 26.0–34.0)
MCH: 32.9 pg (ref 26.0–34.0)
MCHC: 32.5 g/dL (ref 30.0–36.0)
MCHC: 32.5 g/dL (ref 30.0–36.0)
MCV: 101.2 fL — ABNORMAL HIGH (ref 80.0–100.0)
MCV: 101.2 fL — ABNORMAL HIGH (ref 80.0–100.0)
Platelets: 330 10*3/uL (ref 150–400)
Platelets: 314 10*3/uL (ref 150–400)
RBC: 5.08 MIL/uL (ref 4.22–5.81)
RBC: 4.96 MIL/uL (ref 4.22–5.81)
RDW: 15.8 % — ABNORMAL HIGH (ref 11.5–15.5)
RDW: 16 % — ABNORMAL HIGH (ref 11.5–15.5)
WBC: 28.6 10*3/uL — ABNORMAL HIGH (ref 4.0–10.5)
WBC: 26.1 10*3/uL — ABNORMAL HIGH (ref 4.0–10.5)
nRBC: 0.1 % (ref 0.0–0.2)
nRBC: 0 % (ref 0.0–0.2)

## 2019-06-30 LAB — BLOOD GAS, ARTERIAL
Acid-Base Excess: 4.8 mmol/L — ABNORMAL HIGH (ref 0.0–2.0)
Acid-Base Excess: 3.9 mmol/L — ABNORMAL HIGH (ref 0.0–2.0)
Bicarbonate: 28.7 mmol/L — ABNORMAL HIGH (ref 20.0–28.0)
Bicarbonate: 28.6 mmol/L — ABNORMAL HIGH (ref 20.0–28.0)
FIO2: 40
FIO2: 40
O2 Saturation: 95.7 %
O2 Saturation: 97.3 %
Patient temperature: 98.5
Patient temperature: 102
pCO2 arterial: 41.2 mmHg (ref 32.0–48.0)
pCO2 arterial: 48.7 mmHg — ABNORMAL HIGH (ref 32.0–48.0)
pH, Arterial: 7.458 — ABNORMAL HIGH (ref 7.350–7.450)
pH, Arterial: 7.396 (ref 7.350–7.450)
pO2, Arterial: 73.2 mmHg — ABNORMAL LOW (ref 83.0–108.0)
pO2, Arterial: 102 mmHg (ref 83.0–108.0)

## 2019-06-30 LAB — MRSA PCR SCREENING: MRSA by PCR: NEGATIVE

## 2019-06-30 LAB — TRIGLYCERIDES: Triglycerides: 105 mg/dL (ref <150)

## 2019-06-30 LAB — OSMOLALITY: Osmolality: 307 mOsm/kg — ABNORMAL HIGH (ref 275–295)

## 2019-06-30 LAB — VOLATILES,BLD-ACETONE,ETHANOL,ISOPROP,METHANOL
Acetone, blood: <0.01 g/dL (ref 0.000–0.010)
Ethanol, blood: <0.01 g/dL (ref 0.000–0.010)
Isopropanol, blood: <0.01 g/dL (ref 0.000–0.010)
Methanol, blood: <0.01 g/dL (ref 0.000–0.010)

## 2019-06-30 LAB — PROCALCITONIN: Procalcitonin: 7.68 ng/mL

## 2019-06-30 MED ORDER — ENOXAPARIN SODIUM 30 MG/0.3ML ~~LOC~~ SOLN: 30.0000 mg | SUBCUTANEOUS | Status: DC

## 2019-06-30 MED ORDER — NOREPINEPHRINE 4 MG/250ML-% IV SOLN
0.0000 ug/min | INTRAVENOUS | Status: DC
  Administered 2019-06-30: 12 ug/min via INTRAVENOUS
  Administered 2019-06-30: 8 ug/min via INTRAVENOUS
  Filled 2019-06-30: qty 250

## 2019-06-30 MED ORDER — ORAL CARE MOUTH RINSE
15.0000 mL | Freq: Two times a day (BID) | OROMUCOSAL | Status: DC
  Administered 2019-06-30 – 2019-07-04 (×8): 15 mL via OROMUCOSAL

## 2019-06-30 MED ORDER — VANCOMYCIN HCL 1250 MG/250ML IV SOLN
1250.0000 mg | INTRAVENOUS | Status: DC
  Filled 2019-06-30: qty 250

## 2019-06-30 MED ORDER — ACETAMINOPHEN 160 MG/5ML PO SOLN
650.0000 mg | ORAL | Status: DC | PRN
  Filled 2019-06-30: qty 20.3

## 2019-06-30 MED ORDER — FAMOTIDINE IN NACL 20-0.9 MG/50ML-% IV SOLN
20.0000 mg | Freq: Every day | INTRAVENOUS | Status: DC
  Administered 2019-06-30: 20 mg via INTRAVENOUS
  Filled 2019-06-30: qty 50

## 2019-06-30 MED ORDER — PANTOPRAZOLE SODIUM 40 MG IV SOLR
40.0000 mg | INTRAVENOUS | Status: DC
  Administered 2019-06-30 – 2019-07-01 (×2): 40 mg via INTRAVENOUS
  Filled 2019-06-30 (×2): qty 40

## 2019-06-30 MED ORDER — FAMOTIDINE IN NACL 20-0.9 MG/50ML-% IV SOLN: 20.0000 mg | Freq: Two times a day (BID) | INTRAVENOUS | Status: DC

## 2019-06-30 MED ORDER — LACTATED RINGERS IV SOLN: INTRAVENOUS | Status: DC

## 2019-06-30 MED ORDER — ENOXAPARIN SODIUM 40 MG/0.4ML ~~LOC~~ SOLN
40.0000 mg | SUBCUTANEOUS | Status: DC
  Administered 2019-06-30 – 2019-07-03 (×4): 40 mg via SUBCUTANEOUS
  Filled 2019-06-30 (×4): qty 0.4

## 2019-06-30 MED ORDER — IPRATROPIUM-ALBUTEROL 0.5-2.5 (3) MG/3ML IN SOLN
3.0000 mL | Freq: Four times a day (QID) | RESPIRATORY_TRACT | Status: DC
  Administered 2019-06-30 – 2019-07-01 (×7): 3 mL via RESPIRATORY_TRACT
  Filled 2019-06-30 (×8): qty 3

## 2019-06-30 MED ORDER — LACTATED RINGERS IV BOLUS
1000.0000 mL | Freq: Once | INTRAVENOUS | Status: AC
  Administered 2019-06-30: 1000 mL via INTRAVENOUS

## 2019-06-30 MED ORDER — ACETAMINOPHEN 325 MG PO TABS: 650.0000 mg | ORAL_TABLET | ORAL | Status: DC | PRN

## 2019-06-30 MED ORDER — ONDANSETRON HCL 4 MG/2ML IJ SOLN: 4.0000 mg | Freq: Four times a day (QID) | INTRAMUSCULAR | Status: DC | PRN

## 2019-06-30 NOTE — Progress Notes (Signed)
Initial Nutrition Assessment  RD working remotely.   DOCUMENTATION CODES:   Not applicable  INTERVENTION:  - should patient be unable to extubate today, recommend Vital 1.5 @ 30 ml/hr to advance by 10 ml every 4 hours to reach goal rate of 60 ml/hr. - at goal rate, this regimen + kcal from current propofol rate will provide 2221 kcal, 97 grams protein, and 1100 ml free water.    NUTRITION DIAGNOSIS:   Inadequate oral intake related to inability to eat as evidenced by NPO status.  GOAL:   Patient will meet greater than or equal to 90% of their needs  MONITOR:   Vent status, Labs, Weight trends  REASON FOR ASSESSMENT:   Ventilator  ASSESSMENT:   60 year old male who is a 1 ppd smoker with medical history of CAD s/p CABG, COPD, and chronic back pain with opioid dependence. He presented to the ED at [facility] with AMS and was found to have profound metabolic acidosis presumably 2/2 lactic acidosis. UDS was positive for opioids and THC. Alcohol level was negative. He had a similar presentation ~1 month ago but did not require intubation at that time. Narcan in the ED improved his alertness.  Patient was intubated on 3/17 at ~1840 and OGT was placed at that time. He remains intubated. He has been NPO since admission. Weight yesterday was 170 lb and weight today is 167 lb. Weight on 06/01/19 was 170 lb.   Per notes: - opioid OD requiring intubation--plan for SBT today (3/18) - sepsis, SIRS - metabolic acidosis and lactic acidosis--improving - AKI   Patient is currently intubated on ventilator support MV: 10.3 L/min Temp (24hrs), Avg:99.6 F (37.6 C), Min:97.2 F (36.2 C), Max:102.5 F (39.2 C) Propofol: 2.31 ml/hr (61 kcal)   Labs reviewed; BUN: 34 mg/dl, creatinine: 1.56 mg/dl, GFR: 48 ml/min. Medications reviewed; 20 mg IV pepcid/day, 40 mg IV protonix/day. IVF; LR @ 150 ml/hr. Drips; levo @ 8 mcg/min, propofol @ 5 mcg/kg/min.     NUTRITION - FOCUSED PHYSICAL  EXAM:  inability to complete at this time.   Diet Order:   Diet Order            Diet NPO time specified  Diet effective now              EDUCATION NEEDS:   No education needs have been identified at this time  Skin:  Skin Assessment: Skin Integrity Issues: Skin Integrity Issues:: Stage I Stage I: sacrum  Last BM:  3/18  Height:   Ht Readings from Last 1 Encounters:  06/29/19 6' (1.829 m)    Weight:   Wt Readings from Last 1 Encounters:  06/30/19 75.9 kg    Ideal Body Weight:  80.9 kg  BMI:  Body mass index is 22.69 kg/m.  Estimated Nutritional Needs:   Kcal:  2205 kcal  Protein:  91-114 grams  Fluid:  >/= 2 L/day     Jarome Matin, MS, RD, LDN, CNSC Inpatient Clinical Dietitian RD pager # available in [facility]  After hours/weekend pager # available in [facility]

## 2019-06-30 NOTE — Progress Notes (Signed)
Admit: 06/29/2019 LOS: 1  44M with AKI presenting with AMS, resp acidosis, VDRF in setting of COPD, and clinical concern for ethylene glycol ingestion  Subjective:  . Osmole gap was 10 . Ethylene glycol was not detected . Creatinine improved to 1.6, electrolytes are stable, adequate urine output . On PSV  03/17 0701 - 03/18 0700 In: 3750.6 [I.V.:1985.1; IV Piggyback:1765.5] Out: 720 [Urine:620; Emesis/NG output:100]  Filed Weights   06/29/19 1227 06/30/19 0327  Weight: 77 kg 75.9 kg    Scheduled Meds: . chlorhexidine gluconate (MEDLINE KIT)  15 mL Mouth Rinse BID  . Chlorhexidine Gluconate Cloth  6 each Topical Daily  . enoxaparin (LOVENOX) injection  30 mg Subcutaneous Q24H  . ipratropium-albuterol  3 mL Nebulization Q6H  . mouth rinse  15 mL Mouth Rinse 10 times per day  . pantoprazole (PROTONIX) IV  40 mg Intravenous Q24H  . sodium chloride flush  10-40 mL Intracatheter Q12H   Continuous Infusions: . ceFEPime (MAXIPIME) IV Stopped (06/30/19 0820)  . famotidine (PEPCID) IV Stopped (06/30/19 0222)  . lactated ringers 150 mL/hr at 06/30/19 1200  . norepinephrine (LEVOPHED) Adult infusion Stopped (06/30/19 1055)  . propofol (DIPRIVAN) infusion Stopped (06/30/19 0828)  . vancomycin     PRN Meds:.acetaminophen (TYLENOL) oral liquid 160 mg/5 mL, ondansetron (ZOFRAN) IV, sodium chloride flush  Current Labs: reviewed    Physical Exam:  Blood pressure 113/83, pulse (!) 102, temperature 99.4 F (37.4 C), temperature source Axillary, resp. rate 10, height 6' (1.829 m), weight 75.9 kg, SpO2 100 %. Intubated, sedated Coarse breath sounds bilaterally Regular, normal S1-S2 Soft, nontender No peripheral edema  A 1. AKI briskly resolving; baseline normal GFR likely was related to hypovolemia/prerenal insult 2. Concern for ethylene glycol toxicity; this has been excluded 3. Respiratory acidosis/COPD/VDRF, per CCM 4. CAD with history of CABG 5. Chronic pain on long-term  narcotics 6. Lactic acidosis, improving  P . Renal issues are quickly improving, continue supportive care, no further suggestions . Will sign off for now.  Please call with any questions or concerns.  Pt does not need follow up with nephrology.    . Medication Issues; o Preferred narcotic agents for pain control are hydromorphone, fentanyl, and methadone. Morphine should not be used.  o Baclofen should be avoided o Avoid oral sodium phosphate and magnesium citrate based laxatives / bowel preps    Pearson Grippe MD 06/30/2019, 1:02 PM  Recent Labs  Lab 06/29/19 1329 06/30/19 0000 06/30/19 0500  NA 138 140 139  K 4.4 4.1 4.1  CL 93* 100 102  CO2 _0 GLUCOSE 167* 98 103*  BUN 30* 35* 34*  CREATININE 2.38* 1.84* 1.56*  CALCIUM 10.0 9.4 9.0   Recent Labs  Lab 06/29/19 1329 06/30/19 0000 06/30/19 0500  WBC 10.7* 28.6* 26.1*  NEUTROABS 8.9*  --   --   HGB 16.5 16.7 16.3  HCT 54.0* 51.4 50.2  MCV 108.9* 101.2* 101.2*  PLT 331 330 314

## 2019-06-30 NOTE — Progress Notes (Addendum)
NAME:  Antonio Ramirez, MRN:  [id number], DOB:  09-Oct-1960, LOS: 1 ADMISSION DATE:  06/29/2019, CONSULTATION DATE: 06/29/2019 REFERRING MD: Forestine Na, ED, CHIEF COMPLAINT: Status post opioid overdose  Brief History   Patient seen in [facility] emergency room for altered mental status with profound metabolic acidosis.  History of present illness   Patient is a 59 year old Caucasian male1 ppd smoker with CAD s/p CABG, COPD, chronic back pain with opioid dependence who presented to ED at [facility] with altered mental status.  He was found to have a profound metabolic acidosis presumably secondary to lactic acidosis with an anion gap of 18 lactic acid of 7.6.  Urine drug screen was positive for opioids and THC.  Alcohol level was negative.  He had a similar previous presentation about a month ago but did not require intubation at that time.  Narcan in the emergency room did improve his alertness.  There is no other known ingestion.  His creatinine at time of presentation was 2.38, due to this and calcium oxalate crystals the patient was started on fomepizole empirically.  Nephrology seeing the patient does not recommend dialysis at this time recommends hydration with lactated Ringer's.  Bicarbonate had corrected and pH was 7.38 on blood gas done at 8 PM.  Patient also had elevated troponin of the thousand this is felt to be a demand issue per cardiology.  Patient had a similar presentation approximately 1 month ago at [facility].  Past Medical History    CAD (coronary artery disease)    COPD (chronic obstructive pulmonary disease) ([place])    Opioid dependence ([place])    Smoker      Significant Hospital Events   Intubated in [facility] emergency room 06/29/2019  Consults:  Nephrology  Procedures:  Intubation  Significant Diagnostic Tests:  CT ABD/pelvis 3/17 > 1. Mild gastric distention is noted. 2. Large amount of stool seen throughout the colon. 3. Aortic  atherosclerosis. 4. No other abnormality seen in the abdomen or pelvis.  CT head 3/17 >  1. No acute intracranial abnormality. 2. Chronic findings as detailed above.  ECHO 3/18 > 1. Left ventricular ejection fraction, by estimation, is 55 to 60%. The  left ventricle has normal function. The left ventricle demonstrates  regional wall motion abnormalities (see scoring diagram/findings for  description). There is mild asymmetric left  ventricular hypertrophy of the posterior segment. Left ventricular  diastolic parameters were normal. There is septal dysnergy due to  conduction delay and the interventricular septum is flattened in systole,  consistent with right ventricular pressure  overload.  2. Right ventricular systolic function is moderately reduced. The right  ventricular size is moderately enlarged. There is normal pulmonary artery  systolic pressure.  3. The mitral valve is grossly normal. No evidence of mitral valve  regurgitation.  4. The aortic valve is tricuspid. Aortic valve regurgitation is not  visualized. No aortic stenosis is present.  5. The inferior vena cava is dilated in size with >50% respiratory  variability, suggesting right atrial pressure of 8 mmHg.    Micro Data:  COVID 3/17 >  Negative  MRSA PCR 3/17 >  Negative  Blood cultures 3/17 >   Antimicrobials:  Empiric vancomycin and cefepime 3/17 >   Interim history/subjective:  Lying in bed sedated, RN reports no acute events overnight.   Objective   Blood pressure 103/71, pulse 99, temperature 99.4 F (37.4 C), temperature source Axillary, resp. rate 18, height 6' (1.829 m), weight 75.9  kg, SpO2 99 %.    Vent Mode: PRVC FiO2 (%):  [40 %] 40 % Set Rate:  [18 bmp] 18 bmp Vt Set:  [580 mL] 580 mL PEEP:  [5 cmH20] 5 cmH20 Plateau Pressure:  [13 cmH20-20 cmH20] 13 cmH20   Intake/Output Summary (Last 24 hours) at 06/30/2019 0835 Last data filed at 06/30/2019 0800 Gross per 24 hour  Intake 4700.51  ml  Output 720 ml  Net 3980.51 ml   Filed Weights   06/29/19 1227 06/30/19 0327  Weight: 77 kg 75.9 kg    Examination: General: Chronically ill appearing elderly male on mechanical ventilation, in NAD HEENT: ETT, MM pink/moist, PERRL,  Neuro: Will grimace to pain, remains on propofol,  CV: s1s2 regular rate and rhythm, no murmur, rubs, or gallops,  PULM:  Clear to ascultation bilaterally, no increased work of breathing, no added breath sounds  GI: soft, bowel sounds active in all 4 quadrants, non-tender, non-distended, tolerating TF Extremities: warm/dry, no edema  Skin: no rashes or lesions   Resolved Hospital Problem list   NA  Assessment & Plan:   Opioid overdose requiring intubation -No evidence of other ingestion.  Salicylate acetaminophen levels normal. Creatinine at time of presentation was 2.38, due to this and calcium oxalate crystals the patient was started on fomepizole empirically -Home medications include 15 oxy IR TID -Of note patient has had previous  Presentation last month for which he required BIPAP therapy P: Continue vent support with SBT today  Will need evaluation on pain medication regiment prior to discharge  Continue Fomepizole x 4 dose   Respiratory acidosis and acute respiratory failure corrected with mechanical ventilation. P: Continue ventilator support with lung protective strategies  Continue weaning attempts, patient with apneic periods on attempt this morning  Wean PEEP and FiO2 for sats greater than 90%. Head of bed elevated 30 degrees. Plateau pressures less than 30 cm H20.  Follow intermittent chest x-ray and ABG.   Ensure adequate pulmonary hygiene  Follow cultures  VAP bundle in place  PAD protocol Continue empiric antibiotics   Sepsis/SIRS Leukocytosis  -Criteria:  T-max 102.5, tachypnea, tachycardia, AMS, and leukocytosis on admission.  P: Vent support as above Follow cultures  Continue empiric antibiotics  IV hydration   MAP < 65    Metabolic acidosis with a lactic acidosis, improving  -Presumably secondary to dehydration long downtime although history is not entirely clear P: Lactic acid downtrending  Continue vent support as above IV hydration   Acute Kidney Injury  -Likely hemodynamically medicated. Baseline creatinine 0.9-1.0, creatinine on admission 2.38 P: Nephrology following, appreciate assistance  Follow renal function / urine output Trend Bmet Avoid nephrotoxins, ensure adequate renal perfusion  IV hydration Obtain urine lytes   Elevated troponin - Likely demand ischemia 1,059 >> 1,566 P: ECHO as above Supportive care   Best practice:  Diet: N.p.o. Pain/Anxiety/Delirium protocol (if indicated): Propofol for conscious sedation on mechanical ventilation VAP protocol (if indicated): Yes DVT prophylaxis: Lovenox GI prophylaxis: Pepcid Glucose control: Monitor Mobility: Bedrest Code Status: Full Family Communication: Unable to reach Disposition: To ICU  Labs   CBC: Recent Labs  Lab 06/29/19 1329 06/30/19 0000 06/30/19 0500  WBC 10.7* 28.6* 26.1*  NEUTROABS 8.9*  --   --   HGB 16.5 16.7 16.3  HCT 54.0* 51.4 50.2  MCV 108.9* 101.2* 101.2*  PLT 331 330 [id number]    Basic Metabolic Panel: Recent Labs  Lab 06/29/19 1329 06/30/19 0000 06/30/19 0500  NA 138 140 139  K 4.4 4.1 4.1  CL 93* 100 102  CO2 27 28 27   GLUCOSE 167* 98 103*  BUN 30* 35* 34*  CREATININE 2.38* 1.84* 1.56*  CALCIUM 10.0 9.4 9.0   GFR: Estimated Creatinine Clearance: 54.7 mL/min (A) (by C-G formula based on SCr of 1.56 mg/dL (H)). Recent Labs  Lab 06/29/19 1329 06/29/19 1528 06/30/19 0000 06/30/19 0500  WBC 10.7*  --  28.6* 26.1*  LATICACIDVEN 7.6* 3.2*  --   --     Liver Function Tests: Recent Labs  Lab 06/29/19 1329  AST 372*  ALT 373*  ALKPHOS 97  BILITOT 0.6  PROT 6.4*  ALBUMIN 3.3*   Recent Labs  Lab 06/29/19 1329  LIPASE 48   Recent Labs  Lab 06/29/19 1527  AMMONIA  29    ABG    Component Value Date/Time   PHART 7.396 06/30/2019 0334   PCO2ART 48.7 (H) 06/30/2019 0334   PO2ART 102 06/30/2019 0334   HCO3 28.6 (H) 06/30/2019 0334   O2SAT 97.3 06/30/2019 0334     Coagulation Profile: No results for input(s): INR, PROTIME in the last 168 hours.  Cardiac Enzymes: No results for input(s): CKTOTAL, CKMB, CKMBINDEX, TROPONINI in the last 168 hours.  HbA1C: Hgb A1c MFr Bld  Date/Time Value Ref Range Status  06/01/2019 07:34 AM 6.2 (H) 4.8 - 5.6 % Final    Comment:    (NOTE) Pre diabetes:          5.7%-6.4% Diabetes:              >6.4% Glycemic control for   <7.0% adults with diabetes     CBG: No results for input(s): GLUCAP in the last 168 hours.     Critical care time:    Performed by: Johnsie Cancel  Total critical care time: 40 minutes  Critical care time was exclusive of separately billable procedures and treating other patients.  Critical care was necessary to treat or prevent imminent or life-threatening deterioration.  Critical care was time spent personally by me on the following activities: development of treatment plan with patient and/or surrogate as well as nursing, discussions with consultants, evaluation of patient's response to treatment, examination of patient, obtaining history from patient or surrogate, ordering and performing treatments and interventions, ordering and review of laboratory studies, ordering and review of radiographic studies, pulse oximetry and re-evaluation of patient's condition.  Johnsie Cancel, NP-C [facility] Contact / Pager information can be found on Amion  06/30/2019, 9:30 AM    PCCM attending:  This is a 59 year old admitted for altered mental status metabolic acidosis.  Concern for opiate overdose.  Lactate of 7.6.  Also found to have renal failure and calcium oxalate crystals.  Patient was started on a focal zone until ethylene glycol levels came back  negative.  BP 114/73    Pulse (!) 118    Temp 98.8 F (37.1 C) (Axillary)    Resp 17    Ht 6' (1.829 m)    Wt 75.9 kg    SpO2 100%    BMI 22.69 kg/m   General: Debilitated elderly male intubated on mechanical life support HEENT: NCAT, sclera clear endotracheal tube in place.  Heart: Regular rhythm S1-S2 lungs: Bilateral mechanically ventilated breath sounds Neuro: Awake to voice, response to pain.  Labs reviewed, ethylene glycol negative Chest x-ray: Reviewed normal ET tube position.  Assessment: Opiate overdose requiring intubation and mechanical ventilation Acute on chronic hypoxemic hypercarbic respiratory failure requiring  intubation and mechanical ventilation Acute respiratory acidosis secondary to opiate overdose Sepsis, possible aspiration Kidney injury secondary to above Elevated troponin, likely demand ischemia secondary to above  Plan: Hold sedation If patient mental status improves may be able to consider liberation from vent. Placed in SBT to see if he tolerates. Problem kidney function which appears to be improving Follow urine output Continue empiric antimicrobials Can consider potential de-escalation pending culture results Phillip Heal pending.  This patient is critically ill with multiple organ system failure; which, requires frequent high complexity decision making, assessment, support, evaluation, and titration of therapies. This was completed through the application of advanced monitoring technologies and extensive interpretation of multiple databases. During this encounter critical care time was devoted to patient care services described in this note for 32 minutes.  [facility] 06/30/2019 3:07 PM

## 2019-06-30 NOTE — Procedures (Signed)
Extubation Procedure Note  Patient Details:   Name: [name] DOB: [birth year] MRN: [id number]   Airway Documentation:  Airway 8 mm (Active)  Secured at (cm) 26 cm 06/30/19 1200  Measured From Lips 06/30/19 1200  Secured Location Right 06/30/19 1107  Secured By [employer] 06/30/19 1107  Tube Holder Repositioned Yes 06/30/19 1200  Cuff Pressure (cm H2O) 25 cm H2O 06/30/19 0810  Site Condition Dry 06/30/19 1200   Vent end date: (not recorded) Vent end time: (not recorded)   Evaluation  O2 sats: 93 (3L [place]) Complications: none Patient tolerated procedure well. Bilateral Breath Sounds: Diminished   Pt able to speak  Antonio Ramirez 06/30/2019, 3:43 PM

## 2019-06-30 NOTE — Progress Notes (Signed)
Arterial blood gas obtained on PRVC, Vt=580, RR=18, FiO2=40%, and PEEP=5.0.  Results as follows.   Results for Antonio Ramirez, Antonio Ramirez (MRN [id number]) as of 06/30/2019 01:39  Ref. Range 06/30/2019 01:13  FIO2 Unknown 40.00  pH, Arterial Latest Ref Range: 7.350 - 7.450  7.458 (H)  pCO2 arterial Latest Ref Range: 32.0 - 48.0 mmHg 41.2  pO2, Arterial Latest Ref Range: 83.0 - 108.0 mmHg 73.2 (L)  Acid-Base Excess Latest Ref Range: 0.0 - 2.0 mmol/L 4.8 (H)  Bicarbonate Latest Ref Range: 20.0 - 28.0 mmol/L 28.7 (H)  O2 Saturation Latest Units: % 95.7  Patient temperature Unknown 98.5  Allens test (pass/fail) Latest Ref Range: PASS  PASS

## 2019-06-30 NOTE — Progress Notes (Signed)
eLink Physician-Brief Progress Note Patient Name: Antonio Ramirez DOB: 1960/09/10 MRN: [id number]   Date of Service  06/30/2019  HPI/Events of Note  Pt is on the ventilator and needs restraints order for safety, Levophed infusion was ordered as a fixed dose but needs to be titratable.  eICU Interventions  Bilateral soft wrist restraints ordered, Levophed switched to 0-40 mcg.        [name] [name] 06/30/2019, 2:23 AM

## 2019-06-30 NOTE — Progress Notes (Signed)
Pharmacy Antibiotic Note  Antonio Ramirez is a 59 y.o. male admitted on 06/29/2019 with sepsis. Pharmacy has been consulted for Vancomycin and Cefepime dosing.  Plan: Adjust Vancomycin to 1250mg  IV q24h (goal AUC 400-550). Plan for Vancomycin levels at steady state, as indicated.  Continue Cefepime 2g IV q12h. Monitor renal function, cultures, clinical course.    Height: 6' (182.9 cm) Weight: 169 lb 12.1 oz (77 kg) IBW/kg (Calculated) : 77.6  Temp (24hrs), Avg:97.2 F (36.2 C), Min:97.2 F (36.2 C), Max:97.2 F (36.2 C)  Recent Labs  Lab 06/29/19 1329 06/29/19 1528 06/30/19 0000  WBC 10.7*  --  PENDING  CREATININE 2.38*  --  1.84*  LATICACIDVEN 7.6* 3.2*  --     Estimated Creatinine Clearance: 47.1 mL/min (A) (by C-G formula based on SCr of 1.84 mg/dL (H)).    Allergies  Allergen Reactions  . Opana [Oxymorphone Hcl] Hives    hiv    Antimicrobials this admission: 3/17 Zosyn x 1 3/17 Vancomycin >>  3/17 Cefepime >>  Microbiology results: 3/17 BCx: pending 3/17 COVID: negative 3/17 Influenza A/B: negative  3/17 MRSA PCR: negative    Thank you for allowing pharmacy to be a part of this patient's care.  Luiz Ochoa 06/30/2019 12:53 AM

## 2019-06-30 NOTE — Progress Notes (Signed)
eLink Physician-Brief Progress Note Patient Name: Antonio Ramirez DOB: 19-Oct-1960 MRN: [id number]   Date of Service  06/30/2019  HPI/Events of Note  Tylenol ordered orally rather than via NG tube  eICU Interventions  Order changed to via NG tube.        [name] Antonio Ramirez 06/30/2019, 3:53 AM

## 2019-07-01 ENCOUNTER — Other Ambulatory Visit: Payer: Self-pay

## 2019-07-01 LAB — BASIC METABOLIC PANEL
Anion gap: 8 (ref 5–15)
BUN: 33 mg/dL — ABNORMAL HIGH (ref 6–20)
CO2: 25 mmol/L (ref 22–32)
Calcium: 8.3 mg/dL — ABNORMAL LOW (ref 8.9–10.3)
Chloride: 105 mmol/L (ref 98–111)
Creatinine, Ser: 0.9 mg/dL (ref 0.61–1.24)
GFR calc Af Amer: >60 mL/min (ref >60)
GFR calc non Af Amer: >60 mL/min (ref >60)
Glucose, Bld: 92 mg/dL (ref 70–99)
Potassium: 3.3 mmol/L — ABNORMAL LOW (ref 3.5–5.1)
Sodium: 138 mmol/L (ref 135–145)

## 2019-07-01 LAB — CBC
HCT: 45.6 % (ref 39.0–52.0)
Hemoglobin: 15 g/dL (ref 13.0–17.0)
MCH: 32.7 pg (ref 26.0–34.0)
MCHC: 32.9 g/dL (ref 30.0–36.0)
MCV: 99.3 fL (ref 80.0–100.0)
Platelets: 214 10*3/uL (ref 150–400)
RBC: 4.59 MIL/uL (ref 4.22–5.81)
RDW: 16.2 % — ABNORMAL HIGH (ref 11.5–15.5)
WBC: 16.2 10*3/uL — ABNORMAL HIGH (ref 4.0–10.5)
nRBC: 0 % (ref 0.0–0.2)

## 2019-07-01 LAB — CREATININE, SERUM
Creatinine, Ser: 1.01 mg/dL (ref 0.61–1.24)
GFR calc Af Amer: >60 mL/min (ref >60)
GFR calc non Af Amer: >60 mL/min (ref >60)

## 2019-07-01 LAB — PROCALCITONIN: Procalcitonin: 8.36 ng/mL

## 2019-07-01 MED ORDER — CITALOPRAM HYDROBROMIDE 20 MG PO TABS
10.0000 mg | ORAL_TABLET | Freq: Every day | ORAL | Status: DC
  Administered 2019-07-01 – 2019-07-04 (×4): 10 mg via ORAL
  Filled 2019-07-01 (×4): qty 1

## 2019-07-01 MED ORDER — IPRATROPIUM-ALBUTEROL 0.5-2.5 (3) MG/3ML IN SOLN: 3.0000 mL | Freq: Four times a day (QID) | RESPIRATORY_TRACT | Status: DC | PRN

## 2019-07-01 MED ORDER — ASPIRIN EC 81 MG PO TBEC
81.0000 mg | DELAYED_RELEASE_TABLET | Freq: Every day | ORAL | Status: DC
  Administered 2019-07-01 – 2019-07-04 (×4): 81 mg via ORAL
  Filled 2019-07-01 (×4): qty 1

## 2019-07-01 MED ORDER — POTASSIUM CHLORIDE CRYS ER 20 MEQ PO TBCR
40.0000 meq | EXTENDED_RELEASE_TABLET | Freq: Once | ORAL | Status: AC
  Administered 2019-07-01: 40 meq via ORAL
  Filled 2019-07-01: qty 2

## 2019-07-01 MED ORDER — IPRATROPIUM-ALBUTEROL 0.5-2.5 (3) MG/3ML IN SOLN
3.0000 mL | Freq: Two times a day (BID) | RESPIRATORY_TRACT | Status: DC
  Administered 2019-07-02: 3 mL via RESPIRATORY_TRACT
  Filled 2019-07-01 (×3): qty 3

## 2019-07-01 MED ORDER — ATORVASTATIN CALCIUM 40 MG PO TABS
40.0000 mg | ORAL_TABLET | Freq: Every day | ORAL | Status: DC
  Administered 2019-07-01 – 2019-07-03 (×3): 40 mg via ORAL
  Filled 2019-07-01 (×3): qty 1

## 2019-07-01 MED ORDER — CLOPIDOGREL BISULFATE 75 MG PO TABS
75.0000 mg | ORAL_TABLET | Freq: Every day | ORAL | Status: DC
  Administered 2019-07-01 – 2019-07-04 (×4): 75 mg via ORAL
  Filled 2019-07-01 (×4): qty 1

## 2019-07-01 MED ORDER — GABAPENTIN 300 MG PO CAPS
600.0000 mg | ORAL_CAPSULE | Freq: Every day | ORAL | Status: DC
  Administered 2019-07-01 – 2019-07-03 (×3): 600 mg via ORAL
  Filled 2019-07-01 (×3): qty 2

## 2019-07-01 MED ORDER — SODIUM CHLORIDE 0.9 % IV SOLN
2.0000 g | Freq: Three times a day (TID) | INTRAVENOUS | Status: DC
  Administered 2019-07-01: 2 g via INTRAVENOUS
  Filled 2019-07-01: qty 2

## 2019-07-01 MED ORDER — OXYCODONE HCL 5 MG PO TABS: 5.0000 mg | ORAL_TABLET | Freq: Four times a day (QID) | ORAL | Status: DC | PRN

## 2019-07-01 MED ORDER — AMOXICILLIN-POT CLAVULANATE 875-125 MG PO TABS
1.0000 | ORAL_TABLET | Freq: Two times a day (BID) | ORAL | Status: AC
  Administered 2019-07-01 – 2019-07-04 (×6): 1 via ORAL
  Filled 2019-07-01 (×7): qty 1

## 2019-07-01 MED ORDER — MOMETASONE FURO-FORMOTEROL FUM 200-5 MCG/ACT IN AERO
2.0000 | INHALATION_SPRAY | Freq: Two times a day (BID) | RESPIRATORY_TRACT | Status: DC
  Administered 2019-07-02 – 2019-07-04 (×3): 2 via RESPIRATORY_TRACT
  Filled 2019-07-01: qty 8.8

## 2019-07-01 MED ORDER — CALCITRIOL 0.5 MCG PO CAPS
0.5000 ug | ORAL_CAPSULE | Freq: Every day | ORAL | Status: DC
  Administered 2019-07-01 – 2019-07-04 (×4): 0.5 ug via ORAL
  Filled 2019-07-01 (×5): qty 1

## 2019-07-01 NOTE — Progress Notes (Signed)
Patient has become increasingly agitated and non-redirectable as the afternoon has progressed. Attempting to exit bed to go outside and smoke. Nicotine patch ordered. Patient incontinent of stool and placing soiled linens on floor. Has removed 4 condom catheters and multiple pulse oximetry sensors as well. Patient refusing to wear nasal cannula and SCDs. Oxygen as low as 78% on room air. Caregiver at bedside encouraging O2 use. Patient half out of bed with caregivers assistance, RN attempted to get patient to use bedpan as he is very weak but patient refusing. RN helped patient onto [facility]. Pateint very weak and dependent on staff. On return to bed patient became flaccid and had to be placed in bed by staff. Patient educated on need for bedpan use. Patient now settled and compliant. Bed alarm on. Fall matts ordered. Door to room left open.

## 2019-07-01 NOTE — Progress Notes (Signed)
Cardiology consulted for elevated troponin obtained near the beginning of this admission. Since patient denies any chest pain and fairly stable, cardiologist will see tomorrow

## 2019-07-01 NOTE — Progress Notes (Addendum)
NAME:  Antonio Ramirez, MRN:  [id number], DOB:  1960/06/30, LOS: 2 ADMISSION DATE:  06/29/2019, CONSULTATION DATE: 06/29/2019 REFERRING MD: Forestine Na, ED, CHIEF COMPLAINT: Status post opioid overdose  Brief History   Patient seen in [facility] emergency room for altered mental status with profound metabolic acidosis.  History of present illness   Patient is a 59 year old Caucasian male1 ppd smoker with CAD s/p CABG, COPD, chronic back pain with opioid dependence who presented to ED at [facility] with altered mental status.  He was found to have a profound metabolic acidosis presumably secondary to lactic acidosis with an anion gap of 18 lactic acid of 7.6.  Urine drug screen was positive for opioids and THC.  Alcohol level was negative.  He had a similar previous presentation about a month ago but did not require intubation at that time.  Narcan in the emergency room did improve his alertness.  There is no other known ingestion.  His creatinine at time of presentation was 2.38, due to this and calcium oxalate crystals the patient was started on fomepizole empirically.  Nephrology seeing the patient does not recommend dialysis at this time recommends hydration with lactated Ringer's.  Bicarbonate had corrected and pH was 7.38 on blood gas done at 8 PM.  Patient also had elevated troponin of the thousand this is felt to be a demand issue per cardiology.  Patient had a similar presentation approximately 1 month ago at [facility].  Past Medical History   . CAD (coronary artery disease)   . COPD (chronic obstructive pulmonary disease) ([place])   . Opioid dependence ([place])   . [place] [facility] Events   Intubated in [facility] emergency room 06/29/2019  Consults:  Nephrology  Procedures:  Intubation  Significant Diagnostic Tests:  CT ABD/pelvis 3/17 > 1. Mild gastric distention is noted. 2. Large amount of stool seen throughout the colon. 3. Aortic  atherosclerosis. 4. No other abnormality seen in the abdomen or pelvis.  CT head 3/17 >  1. No acute intracranial abnormality. 2. Chronic findings as detailed above.  ECHO 3/18 > 1. Left ventricular ejection fraction, by estimation, is 55 to 60%. The  left ventricle has normal function. The left ventricle demonstrates  regional wall motion abnormalities (see scoring diagram/findings for  description). There is mild asymmetric left  ventricular hypertrophy of the posterior segment. Left ventricular  diastolic parameters were normal. There is septal dysnergy due to  conduction delay and the interventricular septum is flattened in systole,  consistent with right ventricular pressure  overload.  2. Right ventricular systolic function is moderately reduced. The right  ventricular size is moderately enlarged. There is normal pulmonary artery  systolic pressure.  3. The mitral valve is grossly normal. No evidence of mitral valve  regurgitation.  4. The aortic valve is tricuspid. Aortic valve regurgitation is not  visualized. No aortic stenosis is present.  5. The inferior vena cava is dilated in size with >50% respiratory  variability, suggesting right atrial pressure of 8 mmHg.    Micro Data:  COVID 3/17 >  Negative  MRSA PCR 3/17 >  Negative  Blood cultures 3/17 >   Antimicrobials:  Empiric vancomycin and cefepime 3/17 >   Interim history/subjective:  Sitting up in bed mildly confused with no acute complaint.   Objective   Blood pressure 117/80, pulse 80, temperature 97.9 F (36.6 C), temperature source Oral, resp. rate 15, height 6' (1.829 m), weight 79.3  kg, SpO2 91 %. CVP:  [7 mmHg-11 mmHg] 8 mmHg  Vent Mode: PSV;CPAP FiO2 (%):  [30 %-40 %] 30 % Set Rate:  [18 bmp] 18 bmp Vt Set:  [580 mL] 580 mL PEEP:  [5 cmH20] 5 cmH20 Pressure Support:  [5 cmH20-8 cmH20] 5 cmH20 Plateau Pressure:  [17 cmH20] 17 cmH20   Intake/Output Summary (Last 24 hours) at 07/01/2019  0914 Last data filed at 07/01/2019 [id number] Gross per 24 hour  Intake 4129.47 ml  Output 925 ml  Net 3204.47 ml   Filed Weights   06/29/19 1227 06/30/19 0327 07/01/19 0500  Weight: 77 kg 75.9 kg 79.3 kg    Examination: General: Chronically ill appearing elderly male lying in bed, in NAD HEENT: [place]/AT, MM pink/moist, PERRL,  Neuro: Alert and oriented x1-2, can be redirected to answer questions appropriately, non-focal   CV: s1s2 regular rate and rhythm, no murmur, rubs, or gallops,  PULM:  Clear to ascultation, no added breath sounds, no increased work of breathing, oxygen saturations 91-95% on 2.5L [place]  GI: soft, bowel sounds active in all 4 quadrants, non-tender, non-distended Extremities: warm/dry, no edema  Skin: no rashes or lesions  Resolved Hospital Problem list   Respiratory acidosis and acute respiratory failure  Metabolic acidosis with a lactic acidosis Ethylene glycol toxicity excluded   Assessment & Plan:   Opioid overdose requiring intubation -No evidence of other ingestion.  Salicylate acetaminophen levels normal. Creatinine at time of presentation was 2.38, due to this and calcium oxalate crystals the patient was started on fomepizole empirically -Home medications include 15 oxy IR TID and Xtampsa 36mg  Q12hrs -Of note patient has had previous  Presentation last month for which he required BIPAP therapy P: Liberated from vent afternoon of 3/18 Will need to evaluate pain medication regiment prior to discharge   Sepsis/SIRS, sepsis physiology resolved  Leukocytosis, imporving  -Criteria:  T-max 102.5, tachypnea, tachycardia, AMS, and leukocytosis on admission.  -Etiology unclear DDX could include aspiration pneumonia in the setting of unintentional overdose. Wife reports vomiting episodes prior to intubation at [facility]  P: Encourage pulmonary hygiene  Follow culture results  Continue IV antibiotics, de-escalate soon pending culture results   History of COPD  -Home  medications include Albuterol and Spiriva  P: Resume home BDs Continue supplemental oxygen to maintain stat greater than 88% Pulmonary hygiene  Continuous pulse ox  Acute Kidney Injury  -Likely hemodynamically medicated. Baseline creatinine 0.9-1.0, creatinine on admission 2.38 P: Nephrology following, appreciate assistance  Follow renal function / urine output Trend Bmet Avoid nephrotoxins, ensure adequate renal perfusion  IV hydration Obtain urine lytes   Elevated troponin - Likely demand ischemia 1,059 >> 1,566 P: ECHO as above  EKG stable   Best practice:  Diet: Advance as tolerated  Pain/Anxiety/Delirium protocol (if indicated): PRNs VAP protocol (if indicated): N/A DVT prophylaxis: Lovenox GI prophylaxis: Pepcid Glucose control: Monitor Mobility: Up with assist  Code Status: Full Family Communication: Unable to reach Disposition: ICU   Labs   CBC: Recent Labs  Lab 06/29/19 1329 06/30/19 0000 06/30/19 0500  WBC 10.7* 28.6* 26.1*  NEUTROABS 8.9*  --   --   HGB 16.5 16.7 16.3  HCT 54.0* 51.4 50.2  MCV 108.9* 101.2* 101.2*  PLT 331 330 [id number]    Basic Metabolic Panel: Recent Labs  Lab 06/29/19 1329 06/30/19 0000 06/30/19 0500 07/01/19 0444  NA 138 140 139  --   K 4.4 4.1 4.1  --   CL 93* 100 102  --  CO2 27 28 27   --   GLUCOSE 167* 98 103*  --   BUN 30* 35* 34*  --   CREATININE 2.38* 1.84* 1.56* 1.01  CALCIUM 10.0 9.4 9.0  --    GFR: Estimated Creatinine Clearance: 86.4 mL/min (by C-G formula based on SCr of 1.01 mg/dL). Recent Labs  Lab 06/29/19 1329 06/29/19 1528 06/30/19 0000 06/30/19 0500 06/30/19 1025 07/01/19 0444  PROCALCITON  --   --   --   --  7.68 8.36  WBC 10.7*  --  28.6* 26.1*  --   --   LATICACIDVEN 7.6* 3.2*  --   --   --   --     Liver Function Tests: Recent Labs  Lab 06/29/19 1329  AST 372*  ALT 373*  ALKPHOS 97  BILITOT 0.6  PROT 6.4*  ALBUMIN 3.3*   Recent Labs  Lab 06/29/19 1329  LIPASE 48   Recent  Labs  Lab 06/29/19 1527  AMMONIA 29    ABG    Component Value Date/Time   PHART 7.396 06/30/2019 0334   PCO2ART 48.7 (H) 06/30/2019 0334   PO2ART 102 06/30/2019 0334   HCO3 28.6 (H) 06/30/2019 0334   O2SAT 97.3 06/30/2019 0334     Coagulation Profile: No results for input(s): INR, PROTIME in the last 168 hours.  Cardiac Enzymes: No results for input(s): CKTOTAL, CKMB, CKMBINDEX, TROPONINI in the last 168 hours.  HbA1C: Hgb A1c MFr Bld  Date/Time Value Ref Range Status  06/01/2019 07:34 AM 6.2 (H) 4.8 - 5.6 % Final    Comment:    (NOTE) Pre diabetes:          5.7%-6.4% Diabetes:              >6.4% Glycemic control for   <7.0% adults with diabetes     CBG: No results for input(s): GLUCAP in the last 168 hours.     Critical care time:    Performed by: Johnsie Cancel  Total critical care time: 38 minutes  Critical care time was exclusive of separately billable procedures and treating other patients.  Critical care was necessary to treat or prevent imminent or life-threatening deterioration.  Critical care was time spent personally by me on the following activities: development of treatment plan with patient and/or surrogate as well as nursing, discussions with consultants, evaluation of patient's response to treatment, examination of patient, obtaining history from patient or surrogate, ordering and performing treatments and interventions, ordering and review of laboratory studies, ordering and review of radiographic studies, pulse oximetry and re-evaluation of patient's condition.  Johnsie Cancel, NP-C [facility] Contact / Pager information can be found on Amion  07/01/2019, 9:14 AM   PCCM:  59 yo male, admitted for presumed opiate OD, intubated less than one day. LA improved. Given fluids and abx. Was vomiting before, possible aspiration event  Patient doing well this morning.   BP 117/80   Pulse 80   Temp 97.9 F (36.6 C)  (Oral)   Resp 15   Ht 6' (1.829 m)   Wt 79.3 kg   SpO2 91%   BMI 23.71 kg/m   Gen: elderly male, resting in bed  Heart: rrr, s1 s2 Lungs: CTAB no wheeze   Labs reviewed CXR no infiltrate   A:  Opiate overdose  Leukocytosis, Fever 102, possible aspiration pneumonitis  Femoral line  Foley in place  Hypokalemia  Elevated procalcitonin . Polysubstance abuse, home opiate use, I high risk  behavior for readmit  P: Remove line  Remove foley  De-escalte abx to augmentin Complete 5 days  Repeat CBC, to observe leukocytosis  Replete K  OOB and UIC orders Baseline ambulates with walker    Garner Nash, DO [facility] 07/01/2019 10:32 AM

## 2019-07-01 NOTE — Progress Notes (Signed)
Pharmacy Antibiotic Note  Antonio Ramirez is a 59 y.o. male admitted on 06/29/2019 with sepsis. Pharmacy has been consulted for Cefepime dosing.  Plan: increase Cefepime 2g IV to q8h for improved renal function Monitor renal function, cultures, clinical course.    Height: 6' (182.9 cm) Weight: 174 lb 13.2 oz (79.3 kg) IBW/kg (Calculated) : 77.6  Temp (24hrs), Avg:99 F (37.2 C), Min:97.8 F (36.6 C), Max:99.7 F (37.6 C)  Recent Labs  Lab 06/29/19 1329 06/29/19 1528 06/30/19 0000 06/30/19 0500 07/01/19 0444  WBC 10.7*  --  28.6* 26.1*  --   CREATININE 2.38*  --  1.84* 1.56* 1.01  LATICACIDVEN 7.6* 3.2*  --   --   --     Estimated Creatinine Clearance: 86.4 mL/min (by C-G formula based on SCr of 1.01 mg/dL).    Allergies  Allergen Reactions  . Opana [Oxymorphone Hcl] Hives    hiv    Antimicrobials this admission: 3/17 Zosyn x 1 3/17 Vancomycin >> 3/18 3/17 Cefepime >>  Microbiology results: 3/17 BCx: GPC 3/17 COVID: negative 3/17 Influenza A/B: negative  3/17 MRSA PCR: negative    Thank you for allowing pharmacy to be a part of this patient's care.  Dolly Rias RPh 07/01/2019, 7:15 AM

## 2019-07-02 DIAGNOSIS — R6521 Severe sepsis with septic shock: Secondary | ICD-10-CM

## 2019-07-02 DIAGNOSIS — I248 Other forms of acute ischemic heart disease: Secondary | ICD-10-CM

## 2019-07-02 DIAGNOSIS — N170 Acute kidney failure with tubular necrosis: Secondary | ICD-10-CM

## 2019-07-02 DIAGNOSIS — N179 Acute kidney failure, unspecified: Secondary | ICD-10-CM

## 2019-07-02 DIAGNOSIS — A419 Sepsis, unspecified organism: Principal | ICD-10-CM

## 2019-07-02 LAB — CULTURE, BLOOD (ROUTINE X 2)

## 2019-07-02 LAB — PROCALCITONIN: Procalcitonin: 5.4 ng/mL

## 2019-07-02 MED ORDER — NICOTINE 21 MG/24HR TD PT24
21.0000 mg | MEDICATED_PATCH | Freq: Every day | TRANSDERMAL | Status: DC
  Administered 2019-07-02 – 2019-07-04 (×3): 21 mg via TRANSDERMAL
  Filled 2019-07-02 (×3): qty 1

## 2019-07-02 MED ORDER — POTASSIUM CHLORIDE CRYS ER 20 MEQ PO TBCR
40.0000 meq | EXTENDED_RELEASE_TABLET | Freq: Once | ORAL | Status: AC
  Administered 2019-07-02: 40 meq via ORAL
  Filled 2019-07-02: qty 2

## 2019-07-02 MED ORDER — PANTOPRAZOLE SODIUM 40 MG PO TBEC
40.0000 mg | DELAYED_RELEASE_TABLET | Freq: Every day | ORAL | Status: DC
  Administered 2019-07-02 – 2019-07-04 (×3): 40 mg via ORAL
  Filled 2019-07-02 (×3): qty 1

## 2019-07-02 NOTE — Progress Notes (Signed)
07/02/2019 PT came back to assist RN staff in getting pt back into bed.  He sat up for nearly an hour to eat lunch.  He was able to preform the same lateral scoot transfer and this time he was able to verbalize that he needed to have a BM, but not before he was incontinent in the bed.  PT and RN tech assisted pt in clean up. He remains appropriate for SNF level rehab at discharge.  I would like to attempt to stand with RW next session if he is able.  PT will continue to follow acutely for safe mobility progression.     07/02/19 2328  PT Visit Information  Last PT Received On 07/02/19  Assistance Needed +2  History of Present Illness 59 year old Caucasian male1 ppd smoker with CAD s/p CABG, COPD, chronic back pain with opioid dependence who presented to ED at [facility] with altered mental status.  He was found to have a profound metabolic acidosis presumably secondary to lactic acidosis with an anion gap of 18 lactic acid of 7.6.  Urine drug screen was positive for opioids and THC. Narcan given in the ED. Patient also had elevated troponin this is felt to be a demand issue per cardiology.  Pt transferred to [facility] on 06/29/19.  Subjective Data  Patient Stated Goal home  Precautions  Precautions Fall  Restrictions  Weight Bearing Restrictions No  Pain Assessment  Pain Assessment Faces  Faces Pain Scale 4  Pain Location mild grimacing with bed mobility and lateral scoot to recliner  Pain Descriptors / Indicators Grimacing  Pain Intervention(s) Limited activity within patient's tolerance;Monitored during session;Repositioned  Cognition  Arousal/Alertness Awake/alert (initially talking with eyes closed)  Behavior During Therapy Flat affect;Impulsive (mildly impulsive (vs high tolerance for risk?))  Overall Cognitive Status No family/caregiver present to determine baseline cognitive functioning  General Comments Cognition mildly better than before after being up with lights on in  chair in his room, question how much of his current presentation is baseline.  No family here to report.  Bed Mobility  Overal bed mobility Needs Assistance  Bed Mobility Sidelying to Sit;Sit to Supine  Rolling Mod assist  Sit to supine +2 for physical assistance;Mod assist  General bed mobility comments Two person mod assist to go from sitting to supine to support bil LEs and trunk, mod assist to roll for peri care.  Transfers  Overall transfer level Needs assistance  Transfers Lateral/Scoot Transfers   Lateral/Scoot Transfers Mod assist;+2 physical assistance  General transfer comment Mod assist to scoot laterally from drop arm chair back to bed level surface transfer.  Pt continued to need support at trunk to lift to scoot.   Ambulation/Gait  General Gait Details unable at this time.   Balance  Overall balance assessment Needs assistance  Sitting-balance support Bilateral upper extremity supported;Single extremity supported;Feet supported  Sitting balance-Leahy Scale Fair  Sitting balance - Comments bil UE support in sitting and close supervision.   General Comments  General comments (skin integrity, edema, etc.) O2 sats in the mid 80s after transfer, so 2 L O2 [place] returned to his nose at end of session.   PT - End of Session  Equipment Utilized During Treatment Gait belt  Activity Tolerance Patient limited by fatigue  Patient left with call bell/phone within reach;in bed;with bed alarm set  Nurse Communication Mobility status   PT - Assessment/Plan  PT Plan Current plan remains appropriate  PT Visit Diagnosis Muscle weakness (generalized) (M62.81);Difficulty  in walking, not elsewhere classified (R26.2)  PT Frequency (ACUTE ONLY) Min 2X/week  Follow Up Recommendations SNF  PT equipment None recommended by PT  AM-PAC PT "6 Clicks" Mobility Outcome Measure (Version 2)  Help needed turning from your back to your side while in a flat bed without using bedrails? 2  Help needed moving  from lying on your back to sitting on the side of a flat bed without using bedrails? 2  Help needed moving to and from a bed to a chair (including a wheelchair)? 2  Help needed standing up from a chair using your arms (e.g., wheelchair or bedside chair)? 2  Help needed to walk in hospital room? 1  Help needed climbing 3-5 steps with a railing?  1  6 Click Score 10  Consider Recommendation of Discharge To: CIR/SNF/LTACH  PT Goal Progression  Progress towards PT goals Progressing toward goals       PT Time Calculation  PT Start Time (ACUTE ONLY) 1215  PT Stop Time (ACUTE ONLY) 1233  PT Time Calculation (min) (ACUTE ONLY) 18 min  PT General Charges  $$ ACUTE PT VISIT 1 Visit  PT Treatments  $Therapeutic Activity 8-22 mins   Verdene Lennert, PT, DPT  Acute Rehabilitation [phone] pager [phone]) [phone] office

## 2019-07-02 NOTE — Progress Notes (Signed)
PROGRESS NOTE  Antonio Ramirez  [id number] DOB: Apr 29, 1960 DOA: 06/29/2019 PCP: [name], DO   Brief Narrative: Antonio Ramirez is a 59 y.o. male with a history of COPD, ongoing tobacco use, CAD s/p CABG, chronic back pain with opioid dependence, and opioid overdose about a month ago who presented to [facility] ED with altered mental status, poorly responsive at home, found to have a profound lactic acidosis with renal failure and hypotension, troponin elevation felt to be due to demand ischemia, transaminitis, and confusion which improved with narcan. UDS +opioids, THC. He was intubated for airway protection and admitted to [facility] ICU under CCM service. Antibiotics continued for concern for aspiration given AMS and vomiting and fomepizole given due to acidosis with calcium oxalate crystals in urinalysis though stopped as ethylene glycol was not detected and osmolar gap was not elevated. He was extubated 3/18 and has improved metabolic parameters, mental status, but remains very weak diffusely. Will require short term rehabilitation prior returning home.  Assessment & Plan: Active Problems:   Sepsis ([place])   Pressure injury of skin  Acute toxic encephalopathy due to unintentional opioid overdose in opioid-dependent patient:  - Mentation has improved. Required intubation for airway protection 3/17 - 3/18.  Chronic pain with opioid use:  - Will need to evaluate pain medications prior to discharge. Will also recommend narcan at home as this is 2nd occurrence.   Sepsis with lactic acidosis and acute hypoxemic respiratory failure suspected to be due to aspiration pneumonia:  - Continue abx, sepsis physiology has improved and hypoxemia has resolved.  - Wean O2 as tolerated.  - CXR in AM  AKI: Likely hemodynamically mediated: Resolved. Nephrology signed off.  - Avoid nephrotoxins and hypotension.   COPD - Continue pulmonary hygiene and bronchodilators.   Demand ischemia, CAD s/p CABG:  Troponin elevation unlikely to be due to ACS event. With wall motion abnormality reported on echocardiogram, cardiology consulted and recommends no urgent ischemic evaluation.  - Continue ASA, plavix, statin. Not on beta blocker and HR currently in 50's.  Tobacco use:  - Cessation counseling - Nicotine patch  GERD:  - Continue PPI, change to po  RN Pressure Injury Documentation: Pressure Injury 06/30/19 Sacrum Medial Stage 1 -  Intact skin with non-blanchable redness of a localized area usually over a bony prominence. (Active)  06/30/19 0000  Location: Sacrum  Location Orientation: Medial  Staging: Stage 1 -  Intact skin with non-blanchable redness of a localized area usually over a bony prominence.  Wound Description (Comments):   Present on Admission: Yes   DVT prophylaxis: Lovenox 40mg  q24h Code Status: Full Family Communication: None at bedside. RN and CSW have spoken with significant other. Disposition Plan: From home. Due to severe deconditioning, will require SNF at discharge. He is medically stabilizing though remains hypoxemic (not at baseline). Insurance authorization initiated by CSW.  Consultants:   PCCM   Nephrology  Cardiology  Procedures:  - ETT 3/17 - 3/18  - ECHO 3/18 1. Left ventricular ejection fraction, by estimation, is 55 to 60%. The  left ventricle has normal function. The left ventricle demonstrates  regional wall motion abnormalities (see scoring diagram/findings for  description). There is mild asymmetric left  ventricular hypertrophy of the posterior segment. Left ventricular  diastolic parameters were normal. There is septal dysnergy due to  conduction delay and the interventricular septum is flattened in systole,  consistent with right ventricular pressure  overload.  2. Right ventricular systolic function is moderately reduced. The  right  ventricular size is moderately enlarged. There is normal pulmonary artery  systolic pressure.  3.  The mitral valve is grossly normal. No evidence of mitral valve  regurgitation.  4. The aortic valve is tricuspid. Aortic valve regurgitation is not  visualized. No aortic stenosis is present.  5. The inferior vena cava is dilated in size with >50% respiratory  variability, suggesting right atrial pressure of 8 mmHg.   Antimicrobials:  Vancomycin, cefepime 3/17 - 3/19  Augmentin 3/19 >>    Subjective: Was agitated a bit yesterday, not cooperative, but this morning is pleasant and has no complaints. He wants to go back home, though he required max assist by multiple members of the care team yesterday to use BSC. He denies chest pain or dyspnea.   Objective: Vitals:   07/02/19 0418 07/02/19 0500 07/02/19 0818 07/02/19 1415  BP: (!) 141/90   (!) 147/73  Pulse: 74   64  Resp: 16   18  Temp: 98.3 F (36.8 C)   98.4 F (36.9 C)  TempSrc: Oral   Oral  SpO2: 96%  93% 99%  Weight:  82.2 kg    Height:        Intake/Output Summary (Last 24 hours) at 07/02/2019 1620 Last data filed at 07/02/2019 0418 Gross per 24 hour  Intake --  Output 750 ml  Net -750 ml   Filed Weights   06/30/19 0327 07/01/19 0500 07/02/19 0500  Weight: 75.9 kg 79.3 kg 82.2 kg    Gen: 59 y.o. male in no distress, chronically ill-appearing Pulm: Non-labored breathing supplemental oxygen. Coarse. CV: Regular borderline bradycardia. No murmur, rub, or gallop. No JVD, no pedal edema. GI: Abdomen soft, non-tender, non-distended, with normoactive bowel sounds. No organomegaly or masses felt. Ext: Warm, no deformities Skin: No rashes, lesions or ulcers on visualized skin Neuro: Alert and mostly oriented. No focal neurological deficits. Psych: Judgement and insight appear marginal. Mood & affect appropriate.   Data Reviewed: I have personally reviewed following labs and imaging studies  CBC: Recent Labs  Lab 06/29/19 1329 06/30/19 0000 06/30/19 0500 07/01/19 0830  WBC 10.7* 28.6* 26.1* 16.2*  NEUTROABS  8.9*  --   --   --   HGB 16.5 16.7 16.3 15.0  HCT 54.0* 51.4 50.2 45.6  MCV 108.9* 101.2* 101.2* 99.3  PLT 331 330 314 [id number]   Basic Metabolic Panel: Recent Labs  Lab 06/29/19 1329 06/30/19 0000 06/30/19 0500 07/01/19 0444 07/01/19 0830  NA 138 140 139  --  138  K 4.4 4.1 4.1  --  3.3*  CL 93* 100 102  --  105  CO2 27 28 27   --  25  GLUCOSE 167* 98 103*  --  92  BUN 30* 35* 34*  --  33*  CREATININE 2.38* 1.84* 1.56* 1.01 0.90  CALCIUM 10.0 9.4 9.0  --  8.3*   GFR: Estimated Creatinine Clearance: 97 mL/min (by C-G formula based on SCr of 0.9 mg/dL). Liver Function Tests: Recent Labs  Lab 06/29/19 1329  AST 372*  ALT 373*  ALKPHOS 97  BILITOT 0.6  PROT 6.4*  ALBUMIN 3.3*   Recent Labs  Lab 06/29/19 1329  LIPASE 48   Recent Labs  Lab 06/29/19 1527  AMMONIA 29   Coagulation Profile: No results for input(s): INR, PROTIME in the last 168 hours. Cardiac Enzymes: No results for input(s): CKTOTAL, CKMB, CKMBINDEX, TROPONINI in the last 168 hours. BNP (last 3 results) No results for input(s): PROBNP in the  last 8760 hours. HbA1C: No results for input(s): HGBA1C in the last 72 hours. CBG: No results for input(s): GLUCAP in the last 168 hours. Lipid Profile: Recent Labs    06/30/19 0118  TRIG 105   Thyroid Function Tests: No results for input(s): TSH, T4TOTAL, FREET4, T3FREE, THYROIDAB in the last 72 hours. Anemia Panel: No results for input(s): VITAMINB12, FOLATE, FERRITIN, TIBC, IRON, RETICCTPCT in the last 72 hours. Urine analysis:    Component Value Date/Time   COLORURINE YELLOW 06/29/2019 1546   APPEARANCEUR CLOUDY (A) 06/29/2019 1546   LABSPEC 1.024 06/29/2019 1546   PHURINE 5.0 06/29/2019 1546   GLUCOSEU NEGATIVE 06/29/2019 1546   HGBUR SMALL (A) 06/29/2019 1546   BILIRUBINUR SMALL (A) 06/29/2019 1546   KETONESUR NEGATIVE 06/29/2019 1546   PROTEINUR 100 (A) 06/29/2019 1546   NITRITE NEGATIVE 06/29/2019 1546   LEUKOCYTESUR NEGATIVE 06/29/2019  1546   Recent Results (from the past 240 hour(s))  Respiratory Panel by RT PCR (Flu A&B, Covid) - Nasopharyngeal Swab     Status: None   Collection Time: 06/29/19  2:52 PM   Specimen: Nasopharyngeal Swab  Result Value Ref Range Status   SARS Coronavirus 2 by RT PCR NEGATIVE NEGATIVE Final    Comment: (NOTE) SARS-CoV-2 target nucleic acids are NOT DETECTED. The SARS-CoV-2 RNA is generally detectable in upper respiratoy specimens during the acute phase of infection. The lowest concentration of SARS-CoV-2 viral copies this assay can detect is 131 copies/mL. A negative result does not preclude SARS-Cov-2 infection and should not be used as the sole basis for treatment or other patient management decisions. A negative result may occur with  improper specimen collection/handling, submission of specimen other than nasopharyngeal swab, presence of viral mutation(s) within the areas targeted by this assay, and inadequate number of viral copies (<131 copies/mL). A negative result must be combined with clinical observations, patient history, and epidemiological information. The expected result is Negative. Fact Sheet for Patients:  PinkCheek.be Fact Sheet for Healthcare Providers:  GravelBags.it This test is not yet ap proved or cleared by the [place] FDA and  has been authorized for detection and/or diagnosis of SARS-CoV-2 by FDA under an Emergency Use Authorization (EUA). This EUA will remain  in effect (meaning this test can be used) for the duration of the COVID-19 declaration under Section 564(b)(1) of the Act, 21 U.S.C. section 360bbb-3(b)(1), unless the authorization is terminated or revoked sooner.    Influenza A by PCR NEGATIVE NEGATIVE Final   Influenza B by PCR NEGATIVE NEGATIVE Final    Comment: (NOTE) The Xpert Xpress SARS-CoV-2/FLU/RSV assay is intended as an aid in  the diagnosis of influenza from Nasopharyngeal  swab specimens and  should not be used as a sole basis for treatment. Nasal washings and  aspirates are unacceptable for Xpert Xpress SARS-CoV-2/FLU/RSV  testing. Fact Sheet for Patients: PinkCheek.be Fact Sheet for Healthcare Providers: GravelBags.it This test is not yet approved or cleared by the [place] FDA and  has been authorized for detection and/or diagnosis of SARS-CoV-2 by  FDA under an Emergency Use Authorization (EUA). This EUA will remain  in effect (meaning this test can be used) for the duration of the  Covid-19 declaration under Section 564(b)(1) of the Act, 21  U.S.C. section 360bbb-3(b)(1), unless the authorization is  terminated or revoked. Performed at [facility], [address], [place], [place] [address]   Blood culture (routine x 2)     Status: None (Preliminary result)   Collection Time: 06/29/19  3:28 PM   Specimen: Left Antecubital; Blood  Result Value Ref Range Status   Specimen Description LEFT ANTECUBITAL  Final   Special Requests   Final    BOTTLES DRAWN AEROBIC AND ANAEROBIC Blood Culture adequate volume   Culture   Final    NO GROWTH 3 DAYS Performed at [facility], [address] [place], [place] [address]    Report Status PENDING  Incomplete  Blood culture (routine x 2)     Status: Abnormal   Collection Time: 06/29/19  3:33 PM   Specimen: BLOOD LEFT HAND  Result Value Ref Range Status   Specimen Description   Final    BLOOD LEFT HAND Performed at [facility], [address], [place], [place] [address]    Special Requests   Final    BOTTLES DRAWN AEROBIC AND ANAEROBIC Blood Culture results may not be optimal due to an inadequate volume of blood received in culture bottles Performed at [facility], [address], [place], [place] [address]    Culture  Setup Time   Final    GRAM POSITIVE COCCI ANAEROBIC BOTTLE ONLY Gram Stain Report Called to,Read Back By and Verified With:  FRANKLIN S. AT [place] BY THOMPSON S. Performed at [facility], [address] [place], [place] [address]    Culture (A)  Final    VIRIDANS STREPTOCOCCUS THE SIGNIFICANCE OF ISOLATING THIS ORGANISM FROM A SINGLE SET OF BLOOD CULTURES WHEN MULTIPLE SETS ARE DRAWN IS UNCERTAIN. PLEASE NOTIFY THE MICROBIOLOGY DEPARTMENT WITHIN ONE WEEK IF SPECIATION AND SENSITIVITIES ARE REQUIRED. Performed at [facility], [place] [address], [place], [place] [address]    Report Status 07/02/2019 FINAL  Final  MRSA PCR Screening     Status: None   Collection Time: 06/29/19 11:29 PM   Specimen: Nasal Mucosa; Nasopharyngeal  Result Value Ref Range Status   MRSA by PCR NEGATIVE NEGATIVE Final    Comment:        The GeneXpert MRSA Assay (FDA approved for NASAL specimens only), is one component of a comprehensive MRSA colonization surveillance program. It is not intended to diagnose MRSA infection nor to guide or monitor treatment for MRSA infections. Performed at [facility], [place] [address] [place], [place] [address]       Radiology Studies: No results found.  Scheduled Meds:  amoxicillin-clavulanate  1 tablet Oral Q12H   aspirin EC  81 mg Oral Daily   atorvastatin  40 mg Oral q1800   calcitRIOL  0.5 mcg Oral Daily   Chlorhexidine Gluconate Cloth  6 each Topical Daily   citalopram  10 mg Oral Daily   clopidogrel  75 mg Oral Daily   enoxaparin (LOVENOX) injection  40 mg Subcutaneous Q24H   gabapentin  600 mg Oral QHS   ipratropium-albuterol  3 mL Nebulization BID   mouth rinse  15 mL Mouth Rinse BID   mometasone-formoterol  2 puff Inhalation BID   nicotine  21 mg Transdermal Daily   pantoprazole  40 mg Oral Daily   sodium chloride flush  10-40 mL Intracatheter Q12H   Continuous Infusions:   LOS: 3 days   Time spent: 25 minutes.  Patrecia Pour, MD [facility] [email or web address] 07/02/2019, 4:20 PM

## 2019-07-02 NOTE — Progress Notes (Signed)
PHARMACIST - PHYSICIAN COMMUNICATION  DR:  Bonner Puna  CONCERNING: IV to Oral Route Change Policy  RECOMMENDATION: This patient is receiving Protonix by the intravenous route.  Based on criteria approved by the Pharmacy and Therapeutics Committee, the intravenous medication(s) is/are being converted to the equivalent oral dose form(s).   DESCRIPTION: These criteria include:  The patient is eating (either orally or via tube) and/or has been taking other orally administered medications for a least 24 hours  The patient has no evidence of active gastrointestinal bleeding or impaired GI absorption (gastrectomy, short bowel, patient on TNA or NPO).  If you have questions about this conversion, please contact the Pharmacy Department  []   [phone] )  [facility] []   [phone] )  [facility] []   [phone] )  [facility] []   [phone] )  [facility] [x]   [phone] )  [place] 07/02/2019 9:38 AM

## 2019-07-02 NOTE — Progress Notes (Signed)
07/02/2019 PT evaluation completed.  Full note to follow.  Pt needed significant help (more than he described) to laterally scoot to the drop arm recliner chair (and then back again later).  He would benefit from post acute rehab (SNF) before returning home with his family's assistance.    Thanks,  Verdene Lennert, PT, DPT  Acute Rehabilitation [phone] pager [phone] office

## 2019-07-02 NOTE — Evaluation (Signed)
Physical Therapy Evaluation Patient Details Name: Antonio Ramirez MRN: [id number] DOB: 1960-07-18 Today's Date: 07/02/2019   History of Present Illness  59 year old Caucasian male1 ppd smoker with CAD s/p CABG, COPD, chronic back pain with opioid dependence who presented to ED at [facility] with altered mental status.  He was found to have a profound metabolic acidosis presumably secondary to lactic acidosis with an anion gap of 18 lactic acid of 7.6.  Urine drug screen was positive for opioids and THC. Narcan given in the ED. Patient also had elevated troponin this is felt to be a demand issue per cardiology.  Pt transferred to [facility] on 06/29/19.  Clinical Impression  Pt is very weak, needing two person assist to laterally scoot to drop arm recliner chair.  Pt is a questionable historian reporting working with PT and ambulatory without an assistive device at home.  He was unable to stand to transfer today even with two person assist.  He is appropriate for post acute rehab at discharge.  PT to follow acutely for deficits listed below.    Follow Up Recommendations SNF    Equipment Recommendations  None recommended by PT    Recommendations for Other Services   NA     Precautions / Restrictions Precautions Precautions: Fall Restrictions Weight Bearing Restrictions: No      Mobility  Bed Mobility Overal bed mobility: Needs Assistance Bed Mobility: Rolling;Sidelying to Sit Rolling: Mod assist Sidelying to sit: +2 for safety/equipment;Min assist;Mod assist       General bed mobility comments: Cues for initiating and sequencing movements. Assist to steady and facilitate power up. Extra time and effort but pt motivated to do as independently as possible. min to modA +2 for safety.   Transfers Overall transfer level: Needs assistance   Transfers: Lateral/Scoot Transfers          Lateral/Scoot Transfers: Mod assist;+2 physical assistance General transfer comment:  While EOB, pt initiating lateral scoot transfers while therapist discussing plan to transfer to recliner. drop arm recliner and mod A +2 for powerp and over. Significant BLE weakness impacting transfers. Extra time and effort.   Ambulation/Gait             General Gait Details: unable at this time.          Balance Overall balance assessment: Needs assistance Sitting-balance support: Bilateral upper extremity supported;Single extremity supported;Feet supported Sitting balance-Leahy Scale: Fair Sitting balance - Comments: prefers BUE in static sitting but able to sit with single extremity support briefly to doff/don gown.    Standing balance support: Bilateral upper extremity supported   Standing balance comment: pt iniating lateral scoot transfer. Did not attempt standing this session. Pt with significant BLE weakness.                              Pertinent Vitals/Pain Pain Assessment: Faces Faces Pain Scale: Hurts a little bit Pain Location: mild grimacing with bed mobility and lateral scoot to recliner Pain Descriptors / Indicators: Grimacing Pain Intervention(s): Limited activity within patient's tolerance;Monitored during session;Repositioned    Home Living Family/patient expects to be discharged to:: Private residence Living Arrangements: Spouse/significant other Available Help at Discharge: Friend(s);Available 24 hours/day Type of Home: House Home Access: Ramped entrance     Home Layout: One level Home Equipment: Walker - 2 wheels;Wheelchair - manual;Shower seat;Bedside commode;Hospital bed;Walker - 4 wheels      Prior Function Level of Independence: Needs  assistance   Gait / Transfers Assistance Needed: pt reports he was walking in the home with +1 HHA of HHPT PTA. BSC to toilet at times.  ADL's / Homemaking Assistance Needed: SO handles meals. likely needing some assist with bathing/dressing at baseline vs struggling to complete  Comments: home  setup per pt report. pt initially answering "someone's with me all the time I don't know what you want me to tell you" in response to initail home setup questions. pt later providing more details regarding home setup and PLOF though at times somewhat conflicting information reported by pt. Pt reports he is currently receiving HHPT 2x/week. No family present to confirm.     Hand Dominance   Dominant Hand: Right    Extremity/Trunk Assessment   Upper Extremity Assessment Upper Extremity Assessment: Defer to OT evaluation    Lower Extremity Assessment Lower Extremity Assessment: RLE deficits/detail;LLE deficits/detail RLE Deficits / Details: bil LE with weakness L >R with 2+/5 knee and hip on L and 3+/5 knee and hip on the right.   RLE Sensation: decreased light touch LLE Deficits / Details: bil LE with weakness L >R with 2+/5 knee and hip on L and 3+/5 knee and hip on the right.   LLE Sensation: decreased light touch    Cervical / Trunk Assessment Cervical / Trunk Assessment: Normal  Communication   Communication: No difficulties(low volume (baseline?))  Cognition Arousal/Alertness: Awake/alert(initially talking with eyes closed) Behavior During Therapy: Flat affect;Impulsive(mildly impulsive (vs high tolerance for risk?)) Overall Cognitive Status: No family/caregiver present to determine baseline cognitive functioning                                 General Comments: oriented to person and place "hospital".  Appears internally distracted.  slow prosessing. difficulty sequencing. able to follow one step commands.  extra time to initiate tasks.               Assessment/Plan    PT Assessment Patient needs continued PT services  PT Problem List Decreased strength;Decreased activity tolerance;Decreased balance;Decreased mobility;Decreased cognition;Decreased knowledge of use of DME;Decreased safety awareness;Impaired sensation;Pain       PT Treatment Interventions  DME instruction;Gait training;Stair training;Functional mobility training;Therapeutic activities;Therapeutic exercise;Balance training;Cognitive remediation;Neuromuscular re-education;Patient/family education;Wheelchair mobility training    PT Goals (Current goals can be found in the Care Plan section)  Acute Rehab PT Goals Patient Stated Goal: home PT Goal Formulation: With patient Time For Goal Achievement: 07/16/19 Potential to Achieve Goals: Fair    Frequency Min 2X/week        Co-evaluation PT/OT/SLP Co-Evaluation/Treatment: Yes Reason for Co-Treatment: Complexity of the patient's impairments (multi-system involvement);Necessary to address cognition/behavior during functional activity;For patient/therapist safety;To address functional/ADL transfers PT goals addressed during session: Mobility/safety with mobility;Balance;Strengthening/ROM         AM-PAC PT "6 Clicks" Mobility  Outcome Measure Help needed turning from your back to your side while in a flat bed without using bedrails?: A Lot Help needed moving from lying on your back to sitting on the side of a flat bed without using bedrails?: A Lot Help needed moving to and from a bed to a chair (including a wheelchair)?: A Lot Help needed standing up from a chair using your arms (e.g., wheelchair or bedside chair)?: A Lot Help needed to walk in hospital room?: Total Help needed climbing 3-5 steps with a railing? : Total 6 Click Score: 10    End of  Session Equipment Utilized During Treatment: Gait belt Activity Tolerance: Patient limited by fatigue Patient left: in chair;with call bell/phone within reach;with chair alarm set Nurse Communication: Mobility status PT Visit Diagnosis: Muscle weakness (generalized) (M62.81);Difficulty in walking, not elsewhere classified (R26.2)        PT Time Calculation  PT Start Time (ACUTE ONLY) 1053  PT Stop Time (ACUTE ONLY) 1132  PT Time Calculation (min) (ACUTE ONLY) 39 min  PT  General Charges  $$ ACUTE PT VISIT 1 Visit  PT Evaluation  $PT Eval Moderate Complexity 1 Mod  PT Treatments  $Therapeutic Activity 8-22 mins     Verdene Lennert, PT, DPT  Acute Rehabilitation [phone] pager [phone]) [phone] office

## 2019-07-02 NOTE — Consult Note (Signed)
Cardiology Consultation:   Patient ID: Antonio Ramirez MRN: [id number]; DOB: 1960-11-28  Admit date: 06/29/2019 Date of Consult: 07/02/2019  Primary Care Provider: [name], DO Primary Cardiologist: Kate Sable, MD Primary Electrophysiologist:  None    Patient Profile:   Antonio Ramirez is a 59 y.o. male with a hx of CAD post CABG remotely, COPD, opiate dependent back pain chronic pain, admitted with profound metabolic acidosis similar to prior admission approximately 1 month ago who is being seen today for the evaluation of elevated troponin at the request of Dr. Bonner Puna.  History of Present Illness:   Antonio Ramirez 59 year old male with CAD post CABG in 2006 unaware of details, hyperlipidemia, back pain, tobacco use, marijuana use he was admitted with profound metabolic acidosis, encephalopathy and subsequently, troponins were drawn in the emergency department and were elevated, 1059, 1566.  Opiate overdose was suspected, Narcan improved mental status, he required intubation.  Back on 06/01/2019 his troponins were 822, 982-similar admission.  Had some confusion earlier this morning.  Refusing nasal cannula for instance.  Wanting to go out and smoke.  Smokes approximately 1 pack a day.  Has not had any further cardiac work-up since his bypass in 2006.  Has not been followed by cardiology.  Currently he is resting comfortably in bed   Past Medical History:  Diagnosis Date  . CAD (coronary artery disease)   . COPD (chronic obstructive pulmonary disease) ([place])   . Opioid dependence ([place])   . Smoker     Past Surgical History:  Procedure Laterality Date  . cardiac bypass    . CARDIAC SURGERY    . COLON SURGERY       Home Medications:  Prior to Admission medications   Medication Sig Start Date End Date Taking? Authorizing Provider  albuterol (VENTOLIN HFA) 108 (90 Base) MCG/ACT inhaler Inhale 1-2 puffs into the lungs every 6 (six) hours as needed for wheezing or  shortness of breath. 06/03/19  Yes Johnson, Clanford L, MD  AMITIZA 24 MCG capsule Take 24 mcg by mouth 2 (two) times daily with a meal. 05/17/19  Yes [provider]  aspirin EC 81 MG tablet Take 1 tablet (81 mg total) by mouth daily. 06/03/19  Yes Johnson, Clanford L, MD  atorvastatin (LIPITOR) 40 MG tablet Take 1 tablet (40 mg total) by mouth daily at 6 PM. 06/03/19  Yes Johnson, Clanford L, MD  calcitRIOL (ROCALTROL) 0.5 MCG capsule Take 0.5 mcg by mouth daily. 03/16/19  Yes [provider]  citalopram (CELEXA) 10 MG tablet Take 10 tablets by mouth daily.  02/02/17  Yes [provider]  clopidogrel (PLAVIX) 75 MG tablet Take 75 mg by mouth daily. 06/27/19  Yes [provider]  gabapentin (NEURONTIN) 600 MG tablet Take 600 mg by mouth at bedtime.  05/27/19  Yes [provider]  isosorbide mononitrate (IMDUR) 30 MG 24 hr tablet Take 30 mg by mouth daily.  01/22/17  Yes [provider]  omeprazole (PRILOSEC) 20 MG capsule Take 20 mg by mouth daily before breakfast. 30 min prior to breakfast. 04/13/19  Yes [provider]  oxyCODONE (ROXICODONE) 15 MG immediate release tablet Take 15 mg by mouth 3 (three) times daily as needed for pain.  05/27/19  Yes [provider]  polyethylene glycol (MIRALAX / GLYCOLAX) packet Take 17 g by mouth daily. Patient taking differently: Take 17 g by mouth daily as needed for mild constipation or moderate constipation.  02/11/17  Yes Long, W[facility],  MD  senna-docusate (SENOKOT-S) 8.6-50 MG tablet Take 2 tablets by mouth at bedtime as needed for mild constipation. 06/03/19  Yes Johnson, Clanford L, MD  SYMBICORT 160-4.5 MCG/ACT inhaler Inhale 2 puffs into the lungs 2 (two) times daily. 04/29/19  Yes [provider]  XTAMPZA ER 36 MG C12A Take 1 capsule by mouth every 12 (twelve) hours. 06/28/19  Yes [provider]  predniSONE (DELTASONE) 20 MG tablet Take 2 PO QAM x5days Patient not taking:  Reported on 06/30/2019 06/04/19   Murlean Iba, MD    Inpatient Medications: Scheduled Meds: . amoxicillin-clavulanate  1 tablet Oral Q12H  . aspirin EC  81 mg Oral Daily  . atorvastatin  40 mg Oral q1800  . calcitRIOL  0.5 mcg Oral Daily  . Chlorhexidine Gluconate Cloth  6 each Topical Daily  . citalopram  10 mg Oral Daily  . clopidogrel  75 mg Oral Daily  . enoxaparin (LOVENOX) injection  40 mg Subcutaneous Q24H  . gabapentin  600 mg Oral QHS  . ipratropium-albuterol  3 mL Nebulization BID  . mouth rinse  15 mL Mouth Rinse BID  . mometasone-formoterol  2 puff Inhalation BID  . pantoprazole (PROTONIX) IV  40 mg Intravenous Q24H  . sodium chloride flush  10-40 mL Intracatheter Q12H   Continuous Infusions:  PRN Meds: acetaminophen (TYLENOL) oral liquid 160 mg/5 mL, ipratropium-albuterol, ondansetron (ZOFRAN) IV, oxyCODONE, sodium chloride flush  Allergies:    Allergies  Allergen Reactions  . Opana [Oxymorphone Hcl] Hives    hiv    Social History:   Social History   Socioeconomic History  . Marital status: Divorced    Spouse name: Not on file  . Number of children: Not on file  . Years of education: Not on file  . Highest education level: Not on file  Occupational History  . Not on file  Tobacco Use  . Smoking status: Current Every Day Smoker    Packs/day: 1.00    Types: Cigarettes  . Smokeless tobacco: Never Used  Substance and Sexual Activity  . Alcohol use: No  . Drug use: No  . Sexual activity: Not on file  Other Topics Concern  . Not on file  Social History Narrative  . Not on file   Social Determinants of Health   Financial Resource Strain:   . Difficulty of Paying Living Expenses:   Food Insecurity:   . Worried About Charity fundraiser in the Last Year:   . Arboriculturist in the Last Year:   Transportation Needs:   . Film/video editor (Medical):   Marland Kitchen Lack of Transportation (Non-Medical):   Physical Activity:   . Days of Exercise per  Week:   . Minutes of Exercise per Session:   Stress:   . Feeling of Stress :   Social Connections:   . Frequency of Communication with Friends and Family:   . Frequency of Social Gatherings with Friends and Family:   . Attends Religious Services:   . Active Member of Clubs or Organizations:   . Attends Archivist Meetings:   Marland Kitchen Marital Status:   Intimate Partner Violence:   . Fear of Current or Ex-Partner:   . Emotionally Abused:   Marland Kitchen Physically Abused:   . Sexually Abused:     Family History:    Family History  Problem Relation Age of Onset  . COPD Mother      ROS:  Please see the history of present illness.  All other ROS reviewed and negative.     Physical Exam/Data:   Vitals:   07/02/19 0023 07/02/19 0418 07/02/19 0500 07/02/19 0818  BP: (!) 139/98 (!) 141/90    Pulse: 81 74    Resp: 16 16    Temp: 97.7 F (36.5 C) 98.3 F (36.8 C)    TempSrc: Oral Oral    SpO2: 91% 96%  93%  Weight:   82.2 kg   Height:        Intake/Output Summary (Last 24 hours) at 07/02/2019 0859 Last data filed at 07/02/2019 0418 Gross per 24 hour  Intake --  Output 750 ml  Net -750 ml   Last 3 Weights 07/02/2019 07/01/2019 06/30/2019  Weight (lbs) 181 lb 3.5 oz 174 lb 13.2 oz 167 lb 5.3 oz  Weight (kg) 82.2 kg 79.3 kg 75.9 kg     Body mass index is 24.58 kg/m.  General:  Well nourished, well developed, in no acute distress, confused earlier this morning, resting comfortably in bed HEENT: normal Lymph: no adenopathy Neck: no JVD Endocrine:  No thryomegaly Vascular: No carotid bruits; FA pulses 2+ bilaterally without bruits  Cardiac:  normal S1, S2; RRR; no murmur CABG scar Lungs:  clear to auscultation bilaterally, no wheezing, rhonchi or rales  Abd: soft, nontender, no hepatomegaly  Ext: no edema Musculoskeletal:  No deformities Skin: warm and dry  Neuro:  no focal abnormalities noted Psych: Unable to assess but previously confused  EKG:  The EKG was personally  reviewed and demonstrates: 07/01/2019 right bundle branch block, nonspecific ST-T wave changes sinus rhythm, PACs Telemetry:  Telemetry was personally reviewed and demonstrates: Sinus rhythm with PACs  Relevant CV Studies:  Echocardiogram 06/01/2019:  1. Left ventricular ejection fraction, by estimation, is 55 to 60%. The  left ventricle has normal function. The left ventricle demonstrates  regional wall motion abnormalities (see scoring diagram/findings for  description). There is mild asymmetric left  ventricular hypertrophy of the posterior segment. Left ventricular  diastolic parameters were normal. There is septal dysnergy due to  conduction delay and the interventricular septum is flattened in systole,  consistent with right ventricular pressure  overload.  2. Right ventricular systolic function is moderately reduced. The right  ventricular size is moderately enlarged. There is normal pulmonary artery  systolic pressure.  3. The mitral valve is grossly normal. No evidence of mitral valve  regurgitation.  4. The aortic valve is tricuspid. Aortic valve regurgitation is not  visualized. No aortic stenosis is present.  5. The inferior vena cava is dilated in size with >50% respiratory  variability, suggesting right atrial pressure of 8 mmHg.   Laboratory Data:  High Sensitivity Troponin:   Recent Labs  Lab 06/29/19 1329 06/29/19 1527  TROPONINIHS 1,059* 1,566*     Chemistry Recent Labs  Lab 06/30/19 0000 06/30/19 0000 06/30/19 0500 07/01/19 0444 07/01/19 0830  NA 140  --  139  --  138  K 4.1  --  4.1  --  3.3*  CL 100  --  102  --  105  CO2 28  --  27  --  25  GLUCOSE 98  --  103*  --  92  BUN 35*  --  34*  --  33*  CREATININE 1.84*   < > 1.56* 1.01 0.90  CALCIUM 9.4  --  9.0  --  8.3*  GFRNONAA 39*   < > 48* >60 >60  GFRAA 45*   < > 56* >60 >60  ANIONGAP 12  --  10  --  8   < > = values in this interval not displayed.    Recent Labs  Lab 06/29/19 1329    PROT 6.4*  ALBUMIN 3.3*  AST 372*  ALT 373*  ALKPHOS 97  BILITOT 0.6   Hematology Recent Labs  Lab 06/30/19 0000 06/30/19 0500 07/01/19 0830  WBC 28.6* 26.1* 16.2*  RBC 5.08 4.96 4.59  HGB 16.7 16.3 15.0  HCT 51.4 50.2 45.6  MCV 101.2* 101.2* 99.3  MCH 32.9 32.9 32.7  MCHC 32.5 32.5 32.9  RDW 15.8* 16.0* 16.2*  PLT 330 314 214   BNP Recent Labs  Lab 06/29/19 1329  BNP 1,099.0*    DDimer No results for input(s): DDIMER in the last 168 hours.   Radiology/Studies:  CT ABDOMEN PELVIS WO CONTRAST  Result Date: 06/29/2019 CLINICAL DATA:  Generalized abdominal pain. EXAM: CT ABDOMEN AND PELVIS WITHOUT CONTRAST TECHNIQUE: Multidetector CT imaging of the abdomen and pelvis was performed following the standard protocol without IV contrast. COMPARISON:  None. FINDINGS: Lower chest: No acute abnormality. Hepatobiliary: No focal liver abnormality is seen. No gallstones, gallbladder wall thickening, or biliary dilatation. Pancreas: Unremarkable. No pancreatic ductal dilatation or surrounding inflammatory changes. Spleen: Normal in size without focal abnormality. Adrenals/Urinary Tract: Adrenal glands are unremarkable. Kidneys are normal, without renal calculi, focal lesion, or hydronephrosis. Bladder is unremarkable. Stomach/Bowel: Mild gastric distention is noted. Large amount of stool seen throughout the colon. No evidence of bowel obstruction or inflammation is noted. The appendix is not visualized. Vascular/Lymphatic: Aortic atherosclerosis. No enlarged abdominal or pelvic lymph nodes. Reproductive: Prostate is unremarkable. Other: No abdominal wall hernia or abnormality. No abdominopelvic ascites. Musculoskeletal: No acute or significant osseous findings. IMPRESSION: 1. Mild gastric distention is noted. 2. Large amount of stool seen throughout the colon. 3. Aortic atherosclerosis. 4. No other abnormality seen in the abdomen or pelvis. Aortic Atherosclerosis (ICD10-I70.0). Electronically  Signed   By: Marijo Conception M.D.   On: 06/29/2019 16:42   CT Head Wo Contrast  Result Date: 06/29/2019 CLINICAL DATA:  Altered mental status with unclear cause. EXAM: CT HEAD WITHOUT CONTRAST TECHNIQUE: Contiguous axial images were obtained from the base of the skull through the vertex without intravenous contrast. COMPARISON:  06/01/2019 FINDINGS: Brain: No evidence of acute infarction, hemorrhage, hydrocephalus, extra-axial collection or mass lesion/mass effect. Again noted is atrophy and chronic microvascular ischemic changes, greater than expected for the patient's stated age. Again noted are areas of encephalomalacia involving the right temporal lobe. Vascular: No hyperdense vessel or unexpected calcification. Skull: Normal. Negative for fracture or focal lesion. Sinuses/Orbits: There is some mucosal thickening of the ethmoid air cells. There is an air-fluid level within the left maxillary sinus. The remaining paranasal sinuses and mastoid air cells are essentially clear. Other: None. IMPRESSION: 1. No acute intracranial abnormality. 2. Chronic findings as detailed above. Electronically Signed   By: Constance Holster M.D.   On: 06/29/2019 16:40   DG CHEST PORT 1 VIEW  Result Date: 06/30/2019 CLINICAL DATA:  Respiratory failure EXAM: PORTABLE CHEST 1 VIEW COMPARISON:  06/29/2019 FINDINGS: No significant interval change in AP portable chest radiograph. No acute airspace opacity. Esophagogastric tube has been slightly advanced, tip now below the diaphragm, although side port significantly above the gastroesophageal junction. Endotracheal tube remains in appropriate position below the thoracic inlet. IMPRESSION: 1. No acute abnormality of the lungs. 2. Esophagogastric tube has been slightly advanced, tip now below the diaphragm, although side port significantly above  the gastroesophageal junction. Recommend further advancement to ensure subdiaphragmatic positioning of tip and side port. Electronically  Signed   By: Eddie Candle M.D.   On: 06/30/2019 08:15   DG Chest Portable 1 View  Result Date: 06/29/2019 CLINICAL DATA:  59 year old male status post endotracheal and enteric tube placement. EXAM: PORTABLE CHEST 1 VIEW COMPARISON:  Earlier chest radiograph dated 06/29/2019. FINDINGS: Endotracheal tube with tip approximately 4.5 cm above the carina. The side port of the enteric tube is in the distal esophagus and tip just above the gastroesophageal junction. Recommend further advancing of the tube by at least additional 15 cm. No significant interval change in the appearance of the lungs or cardiomediastinal silhouette since the earlier radiograph. IMPRESSION: 1. Interval placement of an endotracheal tube with tip above the carina. 2. Enteric tube with tip just above the gastroesophageal junction. Recommend further advancing into the stomach. Electronically Signed   By: Anner Crete M.D.   On: 06/29/2019 19:20   DG Chest Portable 1 View  Result Date: 06/29/2019 CLINICAL DATA:  Altered mental status. EXAM: PORTABLE CHEST 1 VIEW COMPARISON:  Radiograph 06/01/2019 FINDINGS: Chronic hyperinflation. Increased bronchial thickening above baseline. Post median sternotomy with unchanged heart size and mediastinal contours. Aortic atherosclerosis. No pulmonary edema, large pleural effusion or pneumothorax. No acute osseous abnormalities are seen. IMPRESSION: Increased bronchial thickening above baseline, suggesting acute bronchitis. Chronic hyperinflation. Aortic Atherosclerosis (ICD10-I70.0). Electronically Signed   By: Keith Rake M.D.   On: 06/29/2019 13:12         Assessment and Plan:   Elevated troponin -Likely demand ischemia in the setting of his metabolic derangement, lactic acidosis, not likely a thrombotic event i.e. acute coronary syndrome.  Type II myocardial infarction secondary to excessive demands placed on his heart in the setting of his underlying coronary artery disease.  This does  portend a worsened overall prognosis. -Currently on aspirin, Plavix-continue with dual antiplatelet therapy.  No evidence of bleeding. -He is also on high intensity statin atorvastatin 40 mg a day.  Goal LDL less than 70.  Helps with plaque stabilization. -Recent echocardiogram demonstrated ejection fraction of 60% -Aortic atherosclerosis noted on abdominal CT previously done. -Continue with aggressive secondary risk factor prevention at this point.  He is not complaining of any chest discomfort.  Obviously his troponin elevation is not the reason for his underlying encephalopathy. -I would not recommend any further invasive or ischemic evaluation at this time especially given his underlying medical condition, noncompliance opiate overdose requiring intubation.  Acute kidney injury -Creatinine 2.38 on admission nephrology on board.  Currently 0.9 after resolution of metabolic derangement.  Fever possible aspiration pneumonitis leukocytosis -Per primary team.  Seems to be improving.  Elevated LFTs -Hopefully will continue to trend downward given resolution of metabolic derangement.  Please let us know if we can be of further assistance.  For now, we will sign off.  No further cardiac work-up.     For questions or updates, please contact [place] Please consult [email or web address] for contact info under     Signed, Candee Furbish, MD  07/02/2019 8:59 AM

## 2019-07-02 NOTE — Evaluation (Signed)
Occupational Therapy Evaluation Patient Details Name: Antonio Ramirez MRN: [id number] DOB: Nov 03, 1960 Today's Date: 07/02/2019    History of Present Illness 59 year old Caucasian male1 ppd smoker with CAD s/p CABG, COPD, chronic back pain with opioid dependence who presented to ED at [facility] with altered mental status.  He was found to have a profound metabolic acidosis presumably secondary to lactic acidosis with an anion gap of 18 lactic acid of 7.6.  Urine drug screen was positive for opioids and THC. Narcan given in the ED. Patient also had elevated troponin of the thousand this is felt to be a demand issue per cardiology.  Pt transferred to [facility] on 06/29/19.   Clinical Impression   Pt admitted with the above diagnoses and presents with below problem list. Pt will benefit from continued acute OT to address the below listed deficits and maximize independence with basic ADLs prior to d/c to venue below. PTA pt was needing some assist with basic ADLs. Per previous PT note from February pt was able to walk about 15' with min A and rw. Pt reports he was doing some walking at home with his HHPT and that his level of mobility and need for ADL assistance varied depending on how he's feeling. Pt found to be incontinent of bowel upon arrival and reports this is a new occurrence for him. Pt trialed on RA during lateral scoot to recliner but O2 dropped to min 80s. Reapplied [place] at 3L. Pt needing +2 mod A for lateral scoot transfer to drop arm recliner.      Follow Up Recommendations  SNF    Equipment Recommendations  Other (comment)(defer to next venue)    Recommendations for Other Services       Precautions / Restrictions Precautions Precautions: Fall Restrictions Weight Bearing Restrictions: No      Mobility Bed Mobility Overal bed mobility: Needs Assistance Bed Mobility: Rolling;Sidelying to Sit Rolling: Mod assist Sidelying to sit: +2 for safety/equipment;Min  assist;Mod assist       General bed mobility comments: Cues for initiating and sequencing movements. Assist to steady and facilitate power up. Extra time and effort but pt motivated to do as independently as possible. min A +2 for safety.   Transfers Overall transfer level: Needs assistance   Transfers: Lateral/Scoot Transfers          Lateral/Scoot Transfers: Mod assist;+2 physical assistance General transfer comment: While EOB, pt initiating lateral scoot transfers while therapist discussing plan to transfer to recliner. drop arm recliner and mod A +2 for powerp and over. Significant BLE weakness impacting transfers. Extra time and effort.     Balance Overall balance assessment: Needs assistance Sitting-balance support: Bilateral upper extremity supported;Single extremity supported;Feet supported Sitting balance-Leahy Scale: Fair Sitting balance - Comments: prefers BUE in static sitting but able to sit with single extremity support briefly to doff/don gown.        Standing balance comment: pt iniating lateral scoot transfer. Did not attempt standing this session. Pt with significant BLE weakness.                            ADL either performed or assessed with clinical judgement   ADL Overall ADL's : Needs assistance/impaired Eating/Feeding: Set up;Sitting   Grooming: Sitting;Minimal assistance   Upper Body Bathing: Minimal assistance;Sitting   Lower Body Bathing: Moderate assistance;Sitting/lateral leans   Upper Body Dressing : Minimal assistance;Sitting   Lower Body Dressing: Maximal assistance;Sitting/lateral leans;+2  for safety/equipment   Toilet Transfer: Moderate assistance;+2 for physical assistance;RW;Requires drop arm(lateral scoot) Toilet Transfer Details (indicate cue type and reason): simulated with lateral scoot EOB>drop arm recliner.  Toileting- Clothing Manipulation and Hygiene: Total assistance;+2 for safety/equipment;Bed level Toileting -  Clothing Manipulation Details (indicate cue type and reason): pt found to be incontinent of bowel at start of session. mod A to roll to each side. total A for pericare tasks.        General ADL Comments: Pt found to be incontinent of bowel at start of session. rolled to each side for pericare then sat EOB for a couple of minutes prior to initiating lateral scoot to drop arm recliner.      Vision         Perception     Praxis      Pertinent Vitals/Pain Pain Assessment: Faces Faces Pain Scale: Hurts a little bit Pain Location: mild grimacing with bed mobility and lateral scoot to recliner Pain Descriptors / Indicators: Grimacing Pain Intervention(s): Limited activity within patient's tolerance;Monitored during session;Repositioned     Hand Dominance     Extremity/Trunk Assessment Upper Extremity Assessment Upper Extremity Assessment: Generalized weakness;LUE deficits/detail RUE Deficits / Details: generalized weakness LUE Deficits / Details: Pt reports chronic numbness on palmer side of L hand. Onset about a year ago "same time as my legs" "they think I had a seizure"   Lower Extremity Assessment Lower Extremity Assessment: Defer to PT evaluation       Communication Communication Communication: No difficulties(low volume (baseline?))   Cognition Arousal/Alertness: Awake/alert(initially talking with eyes closed) Behavior During Therapy: Flat affect;Impulsive(mildly impulsive (vs high tolerance for risk?)) Overall Cognitive Status: No family/caregiver present to determine baseline cognitive functioning                                 General Comments: oriented to person and place "hospital".  Appears internally distracted.  slow prosessing. difficulty sequencing. able to follow one step commands.  extra time to initiate tasks.     General Comments  Pt on 3L supplemental O2 at start and end of session. Trialed RA during transfer with sats dropping to mid 80s. Pt  asymmptomatic.     Exercises     Shoulder Instructions      Home Living Family/patient expects to be discharged to:: Private residence Living Arrangements: Spouse/significant other Available Help at Discharge: Friend(s);Available 24 hours/day Type of Home: House Home Access: Ramped entrance     Home Layout: One level     Bathroom Shower/Tub: Tub/shower unit     Bathroom Accessibility: Yes   Home Equipment: Walker - 2 wheels;Wheelchair - manual;Shower seat;Bedside commode;Hospital bed;Walker - 4 wheels          Prior Functioning/Environment Level of Independence: Needs assistance  Gait / Transfers Assistance Needed: pt reports he was walking in the home with +1 HHA of HHPT PTA. BSC to toilet at times. ADL's / Homemaking Assistance Needed: SO handles meals. likely needing some assist with bathing/dressing at baseline vs struggling to complete   Comments: home setup per pt report. pt initially answering "someone's with me all the time I don't know what you want me to tell you" in response to initail home setup questions. pt later providing more details regarding home setup and PLOF though at times somewhat conflicting information reported by pt. Pt reports he is currently receiving HHPT 2x/week. No family present to confirm.  OT Problem List: Decreased strength;Decreased activity tolerance;Impaired balance (sitting and/or standing);Decreased cognition;Decreased safety awareness;Decreased knowledge of use of DME or AE;Decreased knowledge of precautions;Cardiopulmonary status limiting activity;Pain      OT Treatment/Interventions: Self-care/ADL training;Therapeutic exercise;Energy conservation;DME and/or AE instruction;Therapeutic activities;Patient/family education;Balance training;Cognitive remediation/compensation    OT Goals(Current goals can be found in the care plan section) Acute Rehab OT Goals Patient Stated Goal: home OT Goal Formulation: With patient Time For  Goal Achievement: 07/16/19 Potential to Achieve Goals: Good ADL Goals Pt Will Perform Grooming: with modified independence;with set-up;sitting Pt Will Perform Upper Body Bathing: with set-up;sitting Pt Will Perform Lower Body Bathing: with min assist;sitting/lateral leans Pt Will Transfer to Toilet: with min assist;bedside commode Pt Will Perform Toileting - Clothing Manipulation and hygiene: sitting/lateral leans;with mod assist Additional ADL Goal #1: Pt will complete bed mobility at min A level to prepare for EOB/OOB ADLs.  OT Frequency: Min 2X/week   Barriers to D/C:            Co-evaluation PT/OT/SLP Co-Evaluation/Treatment: Yes Reason for Co-Treatment: Necessary to address cognition/behavior during functional activity;For patient/therapist safety;To address functional/ADL transfers   OT goals addressed during session: ADL's and self-care      AM-PAC OT "6 Clicks" Daily Activity     Outcome Measure Help from another person eating meals?: None Help from another person taking care of personal grooming?: A Little Help from another person toileting, which includes using toliet, bedpan, or urinal?: Total Help from another person bathing (including washing, rinsing, drying)?: A Lot Help from another person to put on and taking off regular upper body clothing?: A Lot Help from another person to put on and taking off regular lower body clothing?: A Lot 6 Click Score: 14   End of Session Equipment Utilized During Treatment: Gait belt;Oxygen Nurse Communication: Mobility status;Precautions  Activity Tolerance: Patient limited by fatigue;Patient tolerated treatment well Patient left: in chair;with call bell/phone within reach;with chair alarm set;with SCD's reapplied  OT Visit Diagnosis: Other abnormalities of gait and mobility (R26.89);Muscle weakness (generalized) (M62.81);Other symptoms and signs involving cognitive function;Pain                Time: 1053-1130 OT Time  Calculation (min): 37 min Charges:  OT General Charges $OT Visit: 1 Visit OT Evaluation $OT Eval Moderate Complexity: [place], OT Acute Rehabilitation Services Pager: [phone] Office: [phone]   Hortencia Pilar 07/02/2019, 12:10 PM

## 2019-07-02 NOTE — NC FL2 (Signed)
[place] LEVEL OF CARE SCREENING TOOL     IDENTIFICATION  Patient Name: Antonio Ramirez Birthdate: 07-Oct-1960 Sex: male Admission Date (Current Location): 06/29/2019  [facility] [place] Number:  Herbalist and Address:  [facility],  [place] [place], [place]      Provider Number: [id number]  Attending Physician Name and Address:  Patrecia Pour, MD  Relative Name and Phone Number:  Ms Berdine Addison, wife, [phone]    Current Level of Care: Hospital Recommended Level of Care: [place] Prior Approval Number:    Date Approved/Denied:   PASRR Number: [id number]  Discharge Plan: SNF    Current Diagnoses: Patient Active Problem List   Diagnosis Date Noted  . Pressure injury of skin 06/30/2019  . Sepsis ([place]) 06/29/2019  . Acute respiratory failure with hypoxia ([place]) 06/01/2019  . COPD (chronic obstructive pulmonary disease) ([place])   . COPD with acute exacerbation ([place])   . Current every day smoker   . Opioid dependence ([place])   . Depression   . Hyperlipidemia   . CAD (coronary artery disease)   . Elevated troponin   . AKI (acute kidney injury) ([place])   . Hyperglycemia   . Tobacco abuse   . Demand ischemia of myocardium ([place])   . Hyperlipidemia LDL goal <70     Orientation RESPIRATION BLADDER Height & Weight     Self, Place  O2(3L [place]) External catheter Weight: 82.2 kg Height:  6' (182.9 cm)  BEHAVIORAL SYMPTOMS/MOOD NEUROLOGICAL BOWEL NUTRITION STATUS  (none) (none) Incontinent Diet(see d/c summary)  AMBULATORY STATUS COMMUNICATION OF NEEDS Skin   Extensive Assist Verbally PU Stage and Appropriate Care PU Stage 1 Dressing: (Sacrum medial)                     Personal Care Assistance Level of Assistance  Bathing, Feeding, Dressing Bathing Assistance: Maximum assistance Feeding assistance: Independent Dressing Assistance: Limited assistance     Functional Limitations Info  Sight, Hearing,  Speech Sight Info: Adequate Hearing Info: Adequate Speech Info: Adequate    SPECIAL CARE FACTORS FREQUENCY  PT (By licensed PT), OT (By licensed OT)     PT Frequency: 5X/W OT Frequency: 5X/W            Contractures Contractures Info: Not present    Additional Factors Info  Code Status, Allergies Code Status Info: Full Allergies Info: [place]           Current Medications (07/02/2019):  This is the current hospital active medication list Current Facility-Administered Medications  Medication Dose Route Frequency Provider Last Rate Last Admin  . acetaminophen (TYLENOL) 160 MG/5ML solution 650 mg  650 mg Per Tube Q4H PRN Frederik Pear, MD      . amoxicillin-clavulanate (AUGMENTIN) 875-125 MG per tablet 1 tablet  1 tablet Oral Q12H [name] F, NP   1 tablet at 07/02/19 0932  . aspirin EC tablet 81 mg  81 mg Oral Daily [name] F, NP   81 mg at 07/02/19 0932  . atorvastatin (LIPITOR) tablet 40 mg  40 mg Oral q1800 [name] F, NP   40 mg at 07/01/19 1700  . calcitRIOL (ROCALTROL) capsule 0.5 mcg  0.5 mcg Oral Daily [name] F, NP   0.5 mcg at 07/02/19 0932  . Chlorhexidine Gluconate Cloth 2 % PADS 6 each  6 each Topical Daily Candee Furbish, MD   6 each at 07/02/19 [phone]  .  citalopram (CELEXA) tablet 10 mg  10 mg Oral Daily [name] F, NP   10 mg at 07/02/19 0932  . clopidogrel (PLAVIX) tablet 75 mg  75 mg Oral Daily [name] F, NP   75 mg at 07/02/19 0932  . enoxaparin (LOVENOX) injection 40 mg  40 mg Subcutaneous Q24H [name] F, NP   40 mg at 07/01/19 2145  . gabapentin (NEURONTIN) capsule 600 mg  600 mg Oral QHS [name] F, NP   600 mg at 07/01/19 2146  . ipratropium-albuterol (DUONEB) 0.5-2.5 (3) MG/3ML nebulizer solution 3 mL  3 mL Nebulization BID Icard, Bradley L, DO   3 mL at 07/02/19 0818  . ipratropium-albuterol (DUONEB) 0.5-2.5 (3) MG/3ML nebulizer solution 3 mL  3 mL Nebulization Q6H PRN Icard, Bradley L, DO      .  MEDLINE mouth rinse  15 mL Mouth Rinse BID Icard, Bradley L, DO   15 mL at 07/02/19 0933  . mometasone-formoterol (DULERA) 200-5 MCG/ACT inhaler 2 puff  2 puff Inhalation BID [name] F, NP   2 puff at 07/02/19 0818  . nicotine (NICODERM CQ - dosed in mg/24 hours) patch 21 mg  21 mg Transdermal Daily Patrecia Pour, MD   21 mg at 07/02/19 1048  . ondansetron (ZOFRAN) injection 4 mg  4 mg Intravenous Q6H PRN Shellia Cleverly, MD      . oxyCODONE (Oxy IR/ROXICODONE) immediate release tablet 5 mg  5 mg Oral Q6H PRN [name] F, NP      . pantoprazole (PROTONIX) EC tablet 40 mg  40 mg Oral Daily Patrecia Pour, MD   40 mg at 07/02/19 1048  . sodium chloride flush (NS) 0.9 % injection 10-40 mL  10-40 mL Intracatheter Q12H Candee Furbish, MD   10 mL at 07/02/19 0933  . sodium chloride flush (NS) 0.9 % injection 10-40 mL  10-40 mL Intracatheter PRN Candee Furbish, MD         Discharge Medications: Please see discharge summary for a list of discharge medications.  Relevant Imaging Results:  Relevant Lab Results:   Additional Information [place], [place]

## 2019-07-02 NOTE — TOC Initial Note (Addendum)
Transition of Care [facility]) - Initial/Assessment Note    Patient Details  Name: Antonio Ramirez MRN: [id number] Date of Birth: 11-08-1960  Transition of [facility] CM/SW Contact:    Trish Mage, LCSW Phone Number: 07/02/2019, 1:09 PM  Clinical Narrative:   Offered patient referral to SNF based on OT recommendation.  He declined, stating he already has "home based rehabilitation" through [place].  Asked for permission to call wife. Granted, though patient could not recall her number.  Wife told him that she had come to see him yesterday, that he was non-communicative and was unable to get out of bed.  Based on that, she stated she thought it was in his best interest go.  Patient still not convinced, stating he is "much stronger" than yesterday.  I proposed a compromise, whereby I would send out bed search in [facility] so that we would have a back pu plan if he does not progress as he expects to while in the hospital. Patient agreed to this.  Mr Jacques lives in [place] with his wife and son.  He has DME RW, 3 in 1 and shower chair.  Bed search sent out. Insurance auth initiated. TOC will continue to follow during the course of hospitalization.                 Expected Discharge Plan: Skilled Nursing Facility Barriers to Discharge: SNF Pending bed offer   Patient Goals and CMS Choice Patient states their goals for this hospitalization and ongoing recovery are:: "I want to go home.  I already have rehab at home." CMS Medicare.gov Compare Post Acute Care list provided to:: Patient Choice offered to / list presented to : Patient, Spouse  Expected Discharge Plan and Services Expected Discharge Plan: [place]   Discharge Planning Services: CM Consult Post Acute Care Choice: [place] Living arrangements for the past 2 months: Single Family Home                                      Prior Living Arrangements/Services Living arrangements  for the past 2 months: Single Family Home Lives with:: Spouse Patient language and need for interpreter reviewed:: Yes Do you feel safe going back to the place where you live?: Yes      Need for Family Participation in Patient Care: Yes (Comment) Care giver support system in place?: Yes (comment) Current home services: DME Criminal Activity/Legal Involvement Pertinent to Current Situation/Hospitalization: No - Comment as needed  Activities of Daily Living Home Assistive Devices/Equipment: Wheelchair ADL Screening (condition at time of admission) Patient's cognitive ability adequate to safely complete daily activities?: Yes Is the patient deaf or have difficulty hearing?: No Does the patient have difficulty seeing, even when wearing glasses/contacts?: No Does the patient have difficulty concentrating, remembering, or making decisions?: No Patient able to express need for assistance with ADLs?: Yes Does the patient have difficulty dressing or bathing?: Yes Independently performs ADLs?: Yes (appropriate for developmental age) Does the patient have difficulty walking or climbing stairs?: Yes Weakness of Legs: Both Weakness of Arms/Hands: None  Permission Sought/Granted Permission sought to share information with : Family Supports Permission granted to share information with : Yes, Verbal Permission Granted  Share Information with NAME: Ms Berdine Addison     Permission granted to share info w Relationship: wife     Emotional Assessment Appearance:: Appears stated age Attitude/Demeanor/Rapport: Engaged Affect (  typically observed): Appropriate Orientation: : Oriented to Self, Oriented to Place, Oriented to Situation Alcohol / Substance Use: Not Applicable Psych Involvement: No (comment)  Admission diagnosis:  Shock ([place]) [R57.9] Elevated troponin [R77.8] AKI (acute kidney injury) ([place]) [N17.9] Sepsis ([place]) [A41.9] Altered mental status, unspecified altered mental status type  [R41.82] Sepsis, due to unspecified organism, unspecified whether acute organ dysfunction present [facility]) [A41.9] Patient Active Problem List   Diagnosis Date Noted  . Pressure injury of skin 06/30/2019  . Sepsis ([place]) 06/29/2019  . Acute respiratory failure with hypoxia ([place]) 06/01/2019  . COPD (chronic obstructive pulmonary disease) ([place])   . COPD with acute exacerbation ([place])   . Current every day smoker   . Opioid dependence ([place])   . Depression   . Hyperlipidemia   . CAD (coronary artery disease)   . Elevated troponin   . AKI (acute kidney injury) ([place])   . Hyperglycemia   . Tobacco abuse   . Demand ischemia of myocardium ([place])   . Hyperlipidemia LDL goal <70    PCP:  [name], DO Pharmacy:   [facility] [address] [place] [address] Phone: [phone] Fax: [phone]     Social Determinants of Health (SDOH) Interventions    Readmission Risk Interventions No flowsheet data found.

## 2019-07-03 ENCOUNTER — Inpatient Hospital Stay (HOSPITAL_COMMUNITY): Payer: Medicare Other

## 2019-07-03 LAB — COMPREHENSIVE METABOLIC PANEL
ALT: 918 U/L — ABNORMAL HIGH (ref 0–44)
AST: 304 U/L — ABNORMAL HIGH (ref 15–41)
Albumin: 2.8 g/dL — ABNORMAL LOW (ref 3.5–5.0)
Alkaline Phosphatase: 90 U/L (ref 38–126)
Anion gap: 7 (ref 5–15)
BUN: 23 mg/dL — ABNORMAL HIGH (ref 6–20)
CO2: 27 mmol/L (ref 22–32)
Calcium: 9.5 mg/dL (ref 8.9–10.3)
Chloride: 106 mmol/L (ref 98–111)
Creatinine, Ser: 0.59 mg/dL — ABNORMAL LOW (ref 0.61–1.24)
GFR calc Af Amer: >60 mL/min (ref >60)
GFR calc non Af Amer: >60 mL/min (ref >60)
Glucose, Bld: 86 mg/dL (ref 70–99)
Potassium: 4.2 mmol/L (ref 3.5–5.1)
Sodium: 140 mmol/L (ref 135–145)
Total Bilirubin: 1.6 mg/dL — ABNORMAL HIGH (ref 0.3–1.2)
Total Protein: 5.4 g/dL — ABNORMAL LOW (ref 6.5–8.1)

## 2019-07-03 LAB — CBC
HCT: 47.6 % (ref 39.0–52.0)
Hemoglobin: 15.2 g/dL (ref 13.0–17.0)
MCH: 31.9 pg (ref 26.0–34.0)
MCHC: 31.9 g/dL (ref 30.0–36.0)
MCV: 99.8 fL (ref 80.0–100.0)
Platelets: 201 10*3/uL (ref 150–400)
RBC: 4.77 MIL/uL (ref 4.22–5.81)
RDW: 16 % — ABNORMAL HIGH (ref 11.5–15.5)
WBC: 11.3 10*3/uL — ABNORMAL HIGH (ref 4.0–10.5)
nRBC: 0 % (ref 0.0–0.2)

## 2019-07-03 LAB — HEPATITIS PANEL, ACUTE
HCV Ab: NONREACTIVE
Hep A IgM: NONREACTIVE
Hep B C IgM: NONREACTIVE
Hepatitis B Surface Ag: NONREACTIVE

## 2019-07-03 LAB — PROCALCITONIN: Procalcitonin: 2.62 ng/mL

## 2019-07-03 NOTE — Progress Notes (Addendum)
CSW received a call from [place] who stated that pt's  Auth# is: still TBD  Pt's case's reference [id number]  Pt next review date is 07/07/19  If additional information is requested by [employer] on 3/22 it can be faxed to # to  [phone]  If pt does not D/C until the 23rd, am Bernadene Bell will have to be updated.  CSW will continue to follow for D/C needs.  Alphonse Guild. Dalyce Renne  MSW, LCSW, LCAS, CSI Transitions of Care Clinical Social Worker Care Coordination Department Ph: [phone]

## 2019-07-03 NOTE — Progress Notes (Signed)
PROGRESS NOTE  [name]  [id number] DOB: [birth year] DOA: 06/29/2019 PCP: [name], DO   Brief Narrative: Antonio Ramirez is a 59 y.o. male with a history of COPD, ongoing tobacco use, CAD s/p CABG, chronic back pain with opioid dependence, and opioid overdose about a month ago who presented to [facility] ED with altered mental status, poorly responsive at home, found to have a profound lactic acidosis with renal failure and hypotension, troponin elevation felt to be due to demand ischemia, transaminitis, and confusion which improved with narcan. UDS +opioids, THC. He was intubated for airway protection and admitted to [facility] ICU under CCM service. Antibiotics continued for concern for aspiration given AMS and vomiting and fomepizole given due to acidosis with calcium oxalate crystals in urinalysis though stopped as ethylene glycol was not detected and osmolar gap was not elevated. He was extubated 3/18 and has improved metabolic parameters, mental status, but remains very weak diffusely. Will require short term rehabilitation prior returning home.  Assessment & Plan: Active Problems:   Sepsis ([place])   Pressure injury of skin  Acute toxic encephalopathy due to unintentional opioid overdose in opioid-dependent patient:  - Mentation has improved. Required intubation for airway protection 3/17 - 3/18.  Chronic pain with opioid use:  - Will need to evaluate pain medications prior to discharge. Will also recommend narcan at home as this is 2nd occurrence.   Sepsis with lactic acidosis and acute hypoxemic respiratory failure suspected to be due to aspiration pneumonia:  - Continue abx, sepsis physiology has improved and hypoxemia has resolved.  - Wean O2 as tolerated.  - CXR in AM  Elevated LFTs: CT abd/pelvis on admission without significant hepatobiliary abnormality, etiology likely sepsis. LFTs not checked since admission, have now shown increase in ALT. This could be delayed  improvement with peak not captured in blood work. Bilirubin mildly elevated as well.  - Check hepatitis panel, RUQ U/S.   Acute hypoxemic respiratory failure: Improving. CXR this AM shows atelectasis and retrocardiac opacity.  - Recommend 2 view CXR at follow up to check on retrocardiac opacity.  - Incentive spirometry encouraged.   AKI: Likely hemodynamically mediated: Resolved. Nephrology signed off.  - Avoid nephrotoxins and hypotension.   COPD - Continue pulmonary hygiene and bronchodilators.   Demand ischemia, CAD s/p CABG: Troponin elevation unlikely to be due to ACS event. With wall motion abnormality reported on echocardiogram, cardiology consulted and recommends no urgent ischemic evaluation.  - Continue ASA, plavix, statin. Not on beta blocker and HR currently in 50's.  Tobacco use:  - Cessation counseling - Nicotine patch  GERD:  - Continue PPI  RN Pressure Injury Documentation: Pressure Injury 06/30/19 Sacrum Medial Stage 1 -  Intact skin with non-blanchable redness of a localized area usually over a bony prominence. (Active)  06/30/19 0000  Location: Sacrum  Location Orientation: Medial  Staging: Stage 1 -  Intact skin with non-blanchable redness of a localized area usually over a bony prominence.  Wound Description (Comments):   Present on Admission: Yes   DVT prophylaxis: Lovenox 40mg  q24h Code Status: Full Family Communication: None at bedside. Disposition Plan: From home. Due to severe deconditioning, will require SNF at discharge. Will require evaluation of liver function abnormalities prior to discharge. Hopeful for improvement and DC 3/22.   Consultants:   PCCM   Nephrology  Cardiology  Procedures:  - ETT 3/17 - 3/18  - ECHO 3/18 1. Left ventricular ejection fraction, by estimation, is 55 to 60%. The  left ventricle has normal function. The left ventricle demonstrates  regional wall motion abnormalities (see scoring diagram/findings for    description). There is mild asymmetric left  ventricular hypertrophy of the posterior segment. Left ventricular  diastolic parameters were normal. There is septal dysnergy due to  conduction delay and the interventricular septum is flattened in systole,  consistent with right ventricular pressure  overload.  2. Right ventricular systolic function is moderately reduced. The right  ventricular size is moderately enlarged. There is normal pulmonary artery  systolic pressure.  3. The mitral valve is grossly normal. No evidence of mitral valve  regurgitation.  4. The aortic valve is tricuspid. Aortic valve regurgitation is not  visualized. No aortic stenosis is present.  5. The inferior vena cava is dilated in size with >50% respiratory  variability, suggesting right atrial pressure of 8 mmHg.   Antimicrobials:  Vancomycin, cefepime 3/17 - 3/19  Augmentin 3/19 - 3/21   Subjective: Continues to have severe diffuse weakness. Unable to open milk carton this morning. He denies any pain anywhere and has no shortness of breath, took oxygen off.   Objective: Vitals:   07/02/19 0818 07/02/19 1415 07/02/19 1959 07/03/19 0546  BP:  (!) 147/73 (!) 104/58 118/83  Pulse:  64 68 78  Resp:  18 16 16   Temp:  98.4 F (36.9 C) 98.2 F (36.8 C) 97.7 F (36.5 C)  TempSrc:  Oral Oral Oral  SpO2: 93% 99% 91% 92%  Weight:      Height:        Intake/Output Summary (Last 24 hours) at 07/03/2019 1217 Last data filed at 07/03/2019 0900 Gross per 24 hour  Intake 240 ml  Output 800 ml  Net -560 ml   Filed Weights   06/30/19 0327 07/01/19 0500 07/02/19 0500  Weight: 75.9 kg 79.3 kg 82.2 kg   Gen: Chronically ill-appearing but pleasant male in no distress Pulm: Nonlabored breathing room air. Clearing rhonchi, diminished at bases. CV: Regular rate and rhythm. No murmur, rub, or gallop. No JVD, no dependent edema. GI: Abdomen soft, non-tender, no RUQ tenderness or hepatomegaly, non-distended,  with normoactive bowel sounds.  Ext: Warm, no deformities Skin: No new rashes, lesions or ulcers on visualized skin. Neuro: Alert and oriented. Diffusely weak without focal neurological deficits. Psych: Judgement and insight appear fair. Mood euthymic & affect congruent. Behavior is appropriate.    Data Reviewed: I have personally reviewed following labs and imaging studies  CBC: Recent Labs  Lab 06/29/19 1329 06/30/19 0000 06/30/19 0500 07/01/19 0830 07/03/19 0602  WBC 10.7* 28.6* 26.1* 16.2* 11.3*  NEUTROABS 8.9*  --   --   --   --   HGB 16.5 16.7 16.3 15.0 15.2  HCT 54.0* 51.4 50.2 45.6 47.6  MCV 108.9* 101.2* 101.2* 99.3 99.8  PLT 331 330 314 214 [id number]   Basic Metabolic Panel: Recent Labs  Lab 06/29/19 1329 06/29/19 1329 06/30/19 0000 06/30/19 0500 07/01/19 0444 07/01/19 0830 07/03/19 0602  NA 138  --  140 139  --  138 140  K 4.4  --  4.1 4.1  --  3.3* 4.2  CL 93*  --  100 102  --  105 106  CO2 27  --  28 27  --  25 27  GLUCOSE 167*  --  98 103*  --  92 86  BUN 30*  --  35* 34*  --  33* 23*  CREATININE 2.38*   < > 1.84* 1.56* 1.01 0.90 0.59*  CALCIUM 10.0  --  9.4 9.0  --  8.3* 9.5   < > = values in this interval not displayed.   GFR: Estimated Creatinine Clearance: 109.1 mL/min (A) (by C-G formula based on SCr of 0.59 mg/dL (L)). Liver Function Tests: Recent Labs  Lab 06/29/19 1329 07/03/19 0602  AST 372* 304*  ALT 373* 918*  ALKPHOS 97 90  BILITOT 0.6 1.6*  PROT 6.4* 5.4*  ALBUMIN 3.3* 2.8*   Recent Labs  Lab 06/29/19 1329  LIPASE 48   Recent Labs  Lab 06/29/19 1527  AMMONIA 29   Coagulation Profile: No results for input(s): INR, PROTIME in the last 168 hours. Cardiac Enzymes: No results for input(s): CKTOTAL, CKMB, CKMBINDEX, TROPONINI in the last 168 hours. BNP (last 3 results) No results for input(s): PROBNP in the last 8760 hours. HbA1C: No results for input(s): HGBA1C in the last 72 hours. CBG: No results for input(s): GLUCAP in the  last 168 hours. Lipid Profile: No results for input(s): CHOL, HDL, LDLCALC, TRIG, CHOLHDL, LDLDIRECT in the last 72 hours. Thyroid Function Tests: No results for input(s): TSH, T4TOTAL, FREET4, T3FREE, THYROIDAB in the last 72 hours. Anemia Panel: No results for input(s): VITAMINB12, FOLATE, FERRITIN, TIBC, IRON, RETICCTPCT in the last 72 hours. Urine analysis:    Component Value Date/Time   COLORURINE YELLOW 06/29/2019 1546   APPEARANCEUR CLOUDY (A) 06/29/2019 1546   LABSPEC 1.024 06/29/2019 1546   PHURINE 5.0 06/29/2019 1546   GLUCOSEU NEGATIVE 06/29/2019 1546   HGBUR SMALL (A) 06/29/2019 1546   BILIRUBINUR SMALL (A) 06/29/2019 1546   KETONESUR NEGATIVE 06/29/2019 1546   PROTEINUR 100 (A) 06/29/2019 1546   NITRITE NEGATIVE 06/29/2019 1546   LEUKOCYTESUR NEGATIVE 06/29/2019 1546   Recent Results (from the past 240 hour(s))  Respiratory Panel by RT PCR (Flu A&B, Covid) - Nasopharyngeal Swab     Status: None   Collection Time: 06/29/19  2:52 PM   Specimen: Nasopharyngeal Swab  Result Value Ref Range Status   SARS Coronavirus 2 by RT PCR NEGATIVE NEGATIVE Final    Comment: (NOTE) SARS-CoV-2 target nucleic acids are NOT DETECTED. The SARS-CoV-2 RNA is generally detectable in upper respiratoy specimens during the acute phase of infection. The lowest concentration of SARS-CoV-2 viral copies this assay can detect is 131 copies/mL. A negative result does not preclude SARS-Cov-2 infection and should not be used as the sole basis for treatment or other patient management decisions. A negative result may occur with  improper specimen collection/handling, submission of specimen other than nasopharyngeal swab, presence of viral mutation(s) within the areas targeted by this assay, and inadequate number of viral copies (<131 copies/mL). A negative result must be combined with clinical observations, patient history, and epidemiological information. The expected result is Negative. Fact  Sheet for Patients:  PinkCheek.be Fact Sheet for Healthcare Providers:  GravelBags.it This test is not yet ap proved or cleared by the [place] FDA and  has been authorized for detection and/or diagnosis of SARS-CoV-2 by FDA under an Emergency Use Authorization (EUA). This EUA will remain  in effect (meaning this test can be used) for the duration of the COVID-19 declaration under Section 564(b)(1) of the Act, 21 U.S.C. section 360bbb-3(b)(1), unless the authorization is terminated or revoked sooner.    Influenza A by PCR NEGATIVE NEGATIVE Final   Influenza B by PCR NEGATIVE NEGATIVE Final    Comment: (NOTE) The Xpert Xpress SARS-CoV-2/FLU/RSV assay is intended as an aid in  the diagnosis of influenza from Nasopharyngeal  swab specimens and  should not be used as a sole basis for treatment. Nasal washings and  aspirates are unacceptable for Xpert Xpress SARS-CoV-2/FLU/RSV  testing. Fact Sheet for Patients: PinkCheek.be Fact Sheet for Healthcare Providers: GravelBags.it This test is not yet approved or cleared by the [place] FDA and  has been authorized for detection and/or diagnosis of SARS-CoV-2 by  FDA under an Emergency Use Authorization (EUA). This EUA will remain  in effect (meaning this test can be used) for the duration of the  Covid-19 declaration under Section 564(b)(1) of the Act, 21  U.S.C. section 360bbb-3(b)(1), unless the authorization is  terminated or revoked. Performed at [facility], [address] [place], [place] [address]   Blood culture (routine x 2)     Status: None (Preliminary result)   Collection Time: 06/29/19  3:28 PM   Specimen: Left Antecubital; Blood  Result Value Ref Range Status   Specimen Description LEFT ANTECUBITAL  Final   Special Requests   Final    BOTTLES DRAWN AEROBIC AND ANAEROBIC Blood Culture adequate volume    Culture   Final    NO GROWTH 4 DAYS Performed at [facility], [address], [place], [place] [address]    Report Status PENDING  Incomplete  Blood culture (routine x 2)     Status: Abnormal   Collection Time: 06/29/19  3:33 PM   Specimen: BLOOD LEFT HAND  Result Value Ref Range Status   Specimen Description   Final    BLOOD LEFT HAND Performed at [facility], [address] [place], [place] [address]    Special Requests   Final    BOTTLES DRAWN AEROBIC AND ANAEROBIC Blood Culture results may not be optimal due to an inadequate volume of blood received in culture bottles Performed at [facility], [address], [place], [place] [address]    Culture  Setup Time   Final    GRAM POSITIVE COCCI ANAEROBIC BOTTLE ONLY Gram Stain Report Called to,Read Back By and Verified With: FRANKLIN S. AT [place] BY THOMPSON S. Performed at [facility], [address], [place], [place] [address]    Culture (A)  Final    VIRIDANS STREPTOCOCCUS THE SIGNIFICANCE OF ISOLATING THIS ORGANISM FROM A SINGLE SET OF BLOOD CULTURES WHEN MULTIPLE SETS ARE DRAWN IS UNCERTAIN. PLEASE NOTIFY THE MICROBIOLOGY DEPARTMENT WITHIN ONE WEEK IF SPECIATION AND SENSITIVITIES ARE REQUIRED. Performed at [facility], [place] [address] [place], [place] [address]    Report Status 07/02/2019 FINAL  Final  MRSA PCR Screening     Status: None   Collection Time: 06/29/19 11:29 PM   Specimen: Nasal Mucosa; Nasopharyngeal  Result Value Ref Range Status   MRSA by PCR NEGATIVE NEGATIVE Final    Comment:        The GeneXpert MRSA Assay (FDA approved for NASAL specimens only), is one component of a comprehensive MRSA colonization surveillance program. It is not intended to diagnose MRSA infection nor to guide or monitor treatment for MRSA infections. Performed at [facility], [place] [address], [place], [place] [address]       Radiology Studies: DG CHEST PORT 1 VIEW  Result Date:  07/03/2019 CLINICAL DATA:  Acute respiratory failure. Aspiration pneumonia. EXAM: PORTABLE CHEST 1 VIEW COMPARISON:  June 30, 2019 FINDINGS: The heart, hila, and mediastinum are unchanged. No pneumothorax. No nodules or masses are identified. Hyperexpansion of the lungs remains. Mild atelectasis in the left base. No other acute abnormalities.  IMPRESSION: Mild atelectasis in the left base. No other acute abnormalities. Hyperexpansion of the lungs consistent with COPD or emphysema. Mild opacity in left retrocardiac region may represent atelectasis. Recommend attention on follow-up. Electronically Signed   By: Dorise Bullion III M.D   On: 07/03/2019 04:25    Scheduled Meds: . amoxicillin-clavulanate  1 tablet Oral Q12H  . aspirin EC  81 mg Oral Daily  . atorvastatin  40 mg Oral q1800  . calcitRIOL  0.5 mcg Oral Daily  . Chlorhexidine Gluconate Cloth  6 each Topical Daily  . citalopram  10 mg Oral Daily  . clopidogrel  75 mg Oral Daily  . enoxaparin (LOVENOX) injection  40 mg Subcutaneous Q24H  . gabapentin  600 mg Oral QHS  . ipratropium-albuterol  3 mL Nebulization BID  . mouth rinse  15 mL Mouth Rinse BID  . mometasone-formoterol  2 puff Inhalation BID  . nicotine  21 mg Transdermal Daily  . pantoprazole  40 mg Oral Daily  . sodium chloride flush  10-40 mL Intracatheter Q12H   Continuous Infusions:   LOS: 4 days   Time spent: 25 minutes.  Patrecia Pour, MD [facility] [email or web address] 07/03/2019, 12:17 PM

## 2019-07-03 NOTE — Progress Notes (Addendum)
CSW received a call from Patrick at [facility] at ph: [phone] who is requesting collateral info on pt's baseline previous to admission.  Pt's authorization is approved, however [employer] needs:  1. Confirmation pt is ready, AEB a note stating pt appropriate for D/C if ready and that this should be faxed to : [phone].  CSW called pt's RN at 1039 and MD is at [phone], per AMION.  CSW called pt's provider and left a message.  11:06 AM CSW received a call from pt's provider pt needs liver function assessment and as such pt will not be D/C'ing today.  CSW spoke to the pt who states that knowing the only offer he has is to [place] and as such is agreeable to [place] being iniated by [place] to [facility] but states he would be open to hearing other offers that may be available on 3/22 but does say that [place] is a preferred city to D/C to.   CSW will continue to follow for D/C needs.  Alphonse Guild. Ladoris Lythgoe  MSW, LCSW, LCAS, CSI Transitions of Care Clinical Social Worker Care Coordination Department Ph: [phone]

## 2019-07-04 LAB — COMPREHENSIVE METABOLIC PANEL
ALT: 601 U/L — ABNORMAL HIGH (ref 0–44)
AST: 131 U/L — ABNORMAL HIGH (ref 15–41)
Albumin: 2.7 g/dL — ABNORMAL LOW (ref 3.5–5.0)
Alkaline Phosphatase: 79 U/L (ref 38–126)
Anion gap: 9 (ref 5–15)
BUN: 19 mg/dL (ref 6–20)
CO2: 25 mmol/L (ref 22–32)
Calcium: 9.9 mg/dL (ref 8.9–10.3)
Chloride: 105 mmol/L (ref 98–111)
Creatinine, Ser: 0.64 mg/dL (ref 0.61–1.24)
GFR calc Af Amer: >60 mL/min (ref >60)
GFR calc non Af Amer: >60 mL/min (ref >60)
Glucose, Bld: 83 mg/dL (ref 70–99)
Potassium: 3.9 mmol/L (ref 3.5–5.1)
Sodium: 139 mmol/L (ref 135–145)
Total Bilirubin: 1.4 mg/dL — ABNORMAL HIGH (ref 0.3–1.2)
Total Protein: 5.3 g/dL — ABNORMAL LOW (ref 6.5–8.1)

## 2019-07-04 LAB — CULTURE, BLOOD (ROUTINE X 2)
Culture: NO GROWTH
Special Requests: ADEQUATE

## 2019-07-04 LAB — RESPIRATORY PANEL BY RT PCR (FLU A&B, COVID)
Influenza A by PCR: NEGATIVE
Influenza B by PCR: NEGATIVE
SARS Coronavirus 2 by RT PCR: NEGATIVE

## 2019-07-04 MED ORDER — XTAMPZA ER 36 MG PO C12A: 1.0000 | EXTENDED_RELEASE_CAPSULE | Freq: Two times a day (BID) | ORAL | 0 refills | Status: DC

## 2019-07-04 MED ORDER — AMOXICILLIN-POT CLAVULANATE 875-125 MG PO TABS: 1.0000 | ORAL_TABLET | Freq: Two times a day (BID) | ORAL | 0 refills | Status: AC

## 2019-07-04 MED ORDER — CITALOPRAM HYDROBROMIDE 10 MG PO TABS: 10.0000 mg | ORAL_TABLET | Freq: Every day | ORAL | Status: DC

## 2019-07-04 MED ORDER — OXYCODONE HCL 15 MG PO TABS: 15.0000 mg | ORAL_TABLET | Freq: Three times a day (TID) | ORAL | 0 refills | Status: DC | PRN

## 2019-07-04 NOTE — Discharge Summary (Addendum)
Physician Discharge Summary  Antonio Ramirez [id number] DOB: December 16, 1960 DOA: 06/29/2019  PCP: [name], DO  Admit date: 06/29/2019 Discharge date: 07/04/2019  Admitted From: Home Disposition: SNF   Recommendations for Outpatient Follow-up:  1. Follow up with PCP in 1-2 weeks 2. Please obtain CMP/CBC in one week, consider restarting lipitor. 3. Continue modifying opioid regimen as needed. 4. Consider a 3-6 month follow-up ultrasound of the liver to confirm stability of nodule. 5. Follow up 2 view CXR also recommended for retrocardiac opacity seen on CXR here.   Home Health: N/A Equipment/Devices: Per SNF Discharge Condition: Stable CODE STATUS: Full Diet recommendation: Regular  Brief/Interim Summary: Antonio Ramirez is a 59 y.o. male with a history of COPD, ongoing tobacco use, CAD s/p CABG, chronic back pain with opioid dependence, and opioid overdose about a month ago who presented to [facility] ED with altered mental status, poorly responsive at home, found to have a profound lactic acidosis with renal failure and hypotension, troponin elevation felt to be due to demand ischemia, transaminitis, and confusion which improved with narcan. UDS +opioids, THC. He was intubated for airway protection and admitted to [facility] ICU under CCM service. Antibiotics continued for concern for aspiration given AMS and vomiting and fomepizole given due to acidosis with calcium oxalate crystals in urinalysis though stopped as ethylene glycol was not detected and osmolar gap was not elevated. He was extubated 3/18 and has improved metabolic parameters, mental status, but remains very weak diffusely. Will require short term rehabilitation prior returning home.  Discharge Diagnoses:  Active Problems:   Sepsis ([place])   Pressure injury of skin  Acute toxic encephalopathy due to unintentional opioid overdose in opioid-dependent patient:  - Mentation has improved. Required intubation for airway protection  3/17 - 3/18.  Chronic pain with opioid use:  - Will need to evaluate pain medications prior to discharge home, since in supervised environment, will continue pain control as ordered here which has not resulted in AMS. PDMP reviewed, has been getting regular xtapmza from one provider, Dr. Turner Daniels (last 3/16), also oxycodone 15mg  #90 last on 2/12. - Recommend narcan at home as this is 2nd occurrence.   Sepsis with lactic acidosis and acute hypoxemic respiratory failure suspected to be due to aspiration pneumonia:  - Complete 1 more day of abx with augmentin (total of 7).  Acute hypoxemic respiratory failure: Improving.  - Has durably weaned from oxygen.  - Recommend 2 view CXR at follow up to check on retrocardiac opacity seen on CXR 3/20.  - Incentive spirometry encouraged.   AKI: Likely hemodynamically mediated: Resolved. Nephrology signed off.  - Avoid nephrotoxins and hypotension.   COPD - Continue pulmonary hygiene and bronchodilators.   Demand ischemia, CAD s/p CABG: Troponin elevation unlikely to be due to ACS event. With wall motion abnormality reported on echocardiogram, cardiology consulted and recommends no urgent ischemic evaluation.  - Continue ASA, plavix, statin when able. Not on beta blocker and HR currently in 50's.  Tobacco use:  - Cessation counseling - Nicotine patch while admitted.  GERD:  - Continue PPI  Elevated LFTs: CT abd/pelvis on admission without significant hepatobiliary abnormality, etiology likely sepsis. RUQ U/S with no acute abnormality. No evidence for cholelithiasis or acute cholecystitis.  Hyperlipidemia:  - Restart statin (lipitor 40mg  daily) if LFTs continue improvement.  Hepatic nodule: Seen on RUQ U/S 2. There is a 1.2 cm hyperechoic nodule in the right hepatic lobe. In a low risk patient without a history of malignancy,  this is statistically most likely to represent a benign hepatic hemangioma. - Consider a 3-6 month  follow-up ultrasound to confirm stability of this nodule.  Viridans strep in 1 of 4 blood cultures from admission: D/w ID, Dr. Tommy Medal, who confirms the suspicion that this is contaminant.  Depression:  - Continue celexa at 10mg  daily dose (erroneously on EMR as 100mg  daily)  RN Pressure Injury Documentation: Pressure Injury 06/30/19 Sacrum Medial Stage 1 -  Intact skin with non-blanchable redness of a localized area usually over a bony prominence. (Active)  06/30/19 0000  Location: Sacrum  Location Orientation: Medial  Staging: Stage 1 -  Intact skin with non-blanchable redness of a localized area usually over a bony prominence.  Wound Description (Comments):   Present on Admission: Yes    Discharge Instructions  Allergies as of 07/04/2019      Reactions   Opana [oxymorphone Hcl] Hives   hiv      Medication List    STOP taking these medications   predniSONE 20 MG tablet Commonly known as: DELTASONE     TAKE these medications   albuterol 108 (90 Base) MCG/ACT inhaler Commonly known as: VENTOLIN HFA Inhale 1-2 puffs into the lungs every 6 (six) hours as needed for wheezing or shortness of breath.   Amitiza 24 MCG capsule Generic drug: lubiprostone Take 24 mcg by mouth 2 (two) times daily with a meal.   amoxicillin-clavulanate 875-125 MG tablet Commonly known as: Augmentin Take 1 tablet by mouth 2 (two) times daily for 2 days.   aspirin EC 81 MG tablet Take 1 tablet (81 mg total) by mouth daily.   atorvastatin 40 MG tablet Commonly known as: LIPITOR Take 1 tablet (40 mg total) by mouth daily at 6 PM.   calcitRIOL 0.5 MCG capsule Commonly known as: ROCALTROL Take 0.5 mcg by mouth daily.   citalopram 10 MG tablet Commonly known as: CELEXA Take 1 tablet (10 mg total) by mouth daily. What changed: how much to take   clopidogrel 75 MG tablet Commonly known as: PLAVIX Take 75 mg by mouth daily.   gabapentin 600 MG tablet Commonly known as: NEURONTIN Take 600  mg by mouth at bedtime.   isosorbide mononitrate 30 MG 24 hr tablet Commonly known as: IMDUR Take 30 mg by mouth daily.   omeprazole 20 MG capsule Commonly known as: PRILOSEC Take 20 mg by mouth daily before breakfast. 30 min prior to breakfast.   oxyCODONE 15 MG immediate release tablet Commonly known as: ROXICODONE Take 1 tablet (15 mg total) by mouth 3 (three) times daily as needed for pain.   polyethylene glycol 17 g packet Commonly known as: MIRALAX / GLYCOLAX Take 17 g by mouth daily. What changed:   when to take this  reasons to take this   senna-docusate 8.6-50 MG tablet Commonly known as: Senokot-S Take 2 tablets by mouth at bedtime as needed for mild constipation.   Symbicort 160-4.5 MCG/ACT inhaler Generic drug: budesonide-formoterol Inhale 2 puffs into the lungs 2 (two) times daily.   Xtampza ER 36 MG C12a Generic drug: oxyCODONE ER Take 1 capsule (36 mg total) by mouth every 12 (twelve) hours.       Contact information for follow-up providers    Octavio Graves P, DO Follow up.   Contact information: [address] [place] [address] [phone]            Contact information for after-discharge care    Destination  [name] Preferred SNF .   Service: Skilled Nursing Contact information: [address] [address] [phone]                 Allergies  Allergen Reactions  . Opana [Oxymorphone Hcl] Hives    hiv    Consultations:  PCCM  Procedures/Studies: CT ABDOMEN PELVIS WO CONTRAST  Result Date: 06/29/2019 CLINICAL DATA:  Generalized abdominal pain. EXAM: CT ABDOMEN AND PELVIS WITHOUT CONTRAST TECHNIQUE: Multidetector CT imaging of the abdomen and pelvis was performed following the standard protocol without IV contrast. COMPARISON:  None. FINDINGS: Lower chest: No acute abnormality. Hepatobiliary: No focal liver abnormality is seen. No gallstones, gallbladder wall thickening, or  biliary dilatation. Pancreas: Unremarkable. No pancreatic ductal dilatation or surrounding inflammatory changes. Spleen: Normal in size without focal abnormality. Adrenals/Urinary Tract: Adrenal glands are unremarkable. Kidneys are normal, without renal calculi, focal lesion, or hydronephrosis. Bladder is unremarkable. Stomach/Bowel: Mild gastric distention is noted. Large amount of stool seen throughout the colon. No evidence of bowel obstruction or inflammation is noted. The appendix is not visualized. Vascular/Lymphatic: Aortic atherosclerosis. No enlarged abdominal or pelvic lymph nodes. Reproductive: Prostate is unremarkable. Other: No abdominal wall hernia or abnormality. No abdominopelvic ascites. Musculoskeletal: No acute or significant osseous findings. IMPRESSION: 1. Mild gastric distention is noted. 2. Large amount of stool seen throughout the colon. 3. Aortic atherosclerosis. 4. No other abnormality seen in the abdomen or pelvis. Aortic Atherosclerosis (ICD10-I70.0). Electronically Signed   By: Marijo Conception M.D.   On: 06/29/2019 16:42   CT Head Wo Contrast  Result Date: 06/29/2019 CLINICAL DATA:  Altered mental status with unclear cause. EXAM: CT HEAD WITHOUT CONTRAST TECHNIQUE: Contiguous axial images were obtained from the base of the skull through the vertex without intravenous contrast. COMPARISON:  06/01/2019 FINDINGS: Brain: No evidence of acute infarction, hemorrhage, hydrocephalus, extra-axial collection or mass lesion/mass effect. Again noted is atrophy and chronic microvascular ischemic changes, greater than expected for the patient's stated age. Again noted are areas of encephalomalacia involving the right temporal lobe. Vascular: No hyperdense vessel or unexpected calcification. Skull: Normal. Negative for fracture or focal lesion. Sinuses/Orbits: There is some mucosal thickening of the ethmoid air cells. There is an air-fluid level within the left maxillary sinus. The remaining  paranasal sinuses and mastoid air cells are essentially clear. Other: None. IMPRESSION: 1. No acute intracranial abnormality. 2. Chronic findings as detailed above. Electronically Signed   By: Constance Holster M.D.   On: 06/29/2019 16:40   DG CHEST PORT 1 VIEW  Result Date: 07/03/2019 CLINICAL DATA:  Acute respiratory failure. Aspiration pneumonia. EXAM: PORTABLE CHEST 1 VIEW COMPARISON:  June 30, 2019 FINDINGS: The heart, hila, and mediastinum are unchanged. No pneumothorax. No nodules or masses are identified. Hyperexpansion of the lungs remains. Mild atelectasis in the left base. No other acute abnormalities. IMPRESSION: Mild atelectasis in the left base. No other acute abnormalities. Hyperexpansion of the lungs consistent with COPD or emphysema. Mild opacity in left retrocardiac region may represent atelectasis. Recommend attention on follow-up. Electronically Signed   By: Dorise Bullion III M.D   On: 07/03/2019 04:25   DG CHEST PORT 1 VIEW  Result Date: 06/30/2019 CLINICAL DATA:  Respiratory failure EXAM: PORTABLE CHEST 1 VIEW COMPARISON:  06/29/2019 FINDINGS: No significant interval change in AP portable chest radiograph. No acute airspace opacity. Esophagogastric tube has been slightly advanced, tip now below the diaphragm, although side port significantly above the gastroesophageal junction. Endotracheal tube remains in  appropriate position below the thoracic inlet. IMPRESSION: 1. No acute abnormality of the lungs. 2. Esophagogastric tube has been slightly advanced, tip now below the diaphragm, although side port significantly above the gastroesophageal junction. Recommend further advancement to ensure subdiaphragmatic positioning of tip and side port. Electronically Signed   By: Eddie Candle M.D.   On: 06/30/2019 08:15   DG Chest Portable 1 View  Result Date: 06/29/2019 CLINICAL DATA:  59 year old male status post endotracheal and enteric tube placement. EXAM: PORTABLE CHEST 1 VIEW  COMPARISON:  Earlier chest radiograph dated 06/29/2019. FINDINGS: Endotracheal tube with tip approximately 4.5 cm above the carina. The side port of the enteric tube is in the distal esophagus and tip just above the gastroesophageal junction. Recommend further advancing of the tube by at least additional 15 cm. No significant interval change in the appearance of the lungs or cardiomediastinal silhouette since the earlier radiograph. IMPRESSION: 1. Interval placement of an endotracheal tube with tip above the carina. 2. Enteric tube with tip just above the gastroesophageal junction. Recommend further advancing into the stomach. Electronically Signed   By: Anner Crete M.D.   On: 06/29/2019 19:20   DG Chest Portable 1 View  Result Date: 06/29/2019 CLINICAL DATA:  Altered mental status. EXAM: PORTABLE CHEST 1 VIEW COMPARISON:  Radiograph 06/01/2019 FINDINGS: Chronic hyperinflation. Increased bronchial thickening above baseline. Post median sternotomy with unchanged heart size and mediastinal contours. Aortic atherosclerosis. No pulmonary edema, large pleural effusion or pneumothorax. No acute osseous abnormalities are seen. IMPRESSION: Increased bronchial thickening above baseline, suggesting acute bronchitis. Chronic hyperinflation. Aortic Atherosclerosis (ICD10-I70.0). Electronically Signed   By: Keith Rake M.D.   On: 06/29/2019 13:12   US Abdomen Limited RUQ  Result Date: 07/03/2019 CLINICAL DATA:  Elevated LFTs EXAM: ULTRASOUND ABDOMEN LIMITED RIGHT UPPER QUADRANT COMPARISON:  None. FINDINGS: Gallbladder: No gallstones or wall thickening visualized. No sonographic Murphy sign noted by sonographer. Common bile duct: Diameter: 3 mm Liver: The echogenicity is unremarkable. There is a 1.2 x 0.7 x 0.9 cm hyperechoic nodule in the right hepatic lobe. Portal vein is patent on color Doppler imaging with normal direction of blood flow towards the liver. Other: None. IMPRESSION: 1. No acute abnormality. No  evidence for cholelithiasis or acute cholecystitis. 2. There is a 1.2 cm hyperechoic nodule in the right hepatic lobe. In a low risk patient without a history of malignancy, this is statistically most likely to represent a benign hepatic hemangioma. Consider a 3-6 month follow-up ultrasound to confirm stability of this nodule. Electronically Signed   By: Constance Holster M.D.   On: 07/03/2019 14:51    Subjective: No complaints, has not had oversedation.   Discharge Exam: Vitals:   07/04/19 0553 07/04/19 0801  BP: 140/89   Pulse: 62   Resp: 16   Temp: 98 F (36.7 C)   SpO2: 94% 90%   General: Pt is alert, awake, not in acute distress, frail Cardiovascular: RRR, S1/S2 +, no rubs, no gallops Respiratory: CTA bilaterally, no wheezing, no rhonchi Abdominal: Soft, NT, ND, bowel sounds + Extremities: No edema, no cyanosis  Labs: BNP (last 3 results) Recent Labs    06/01/19 0952 06/29/19 1329  BNP 535.0* 0000000*   Basic Metabolic Panel: Recent Labs  Lab 06/30/19 0000 06/30/19 0000 06/30/19 0500 07/01/19 0444 07/01/19 0830 07/03/19 0602 07/04/19 0528  NA 140  --  139  --  138 140 139  K 4.1  --  4.1  --  3.3* 4.2 3.9  CL 100  --  102  --  105 106 105  CO2 28  --  27  --  25 27 25   GLUCOSE 98  --  103*  --  92 86 83  BUN 35*  --  34*  --  33* 23* 19  CREATININE 1.84*   < > 1.56* 1.01 0.90 0.59* 0.64  CALCIUM 9.4  --  9.0  --  8.3* 9.5 9.9   < > = values in this interval not displayed.   Liver Function Tests: Recent Labs  Lab 06/29/19 1329 07/03/19 0602 07/04/19 0528  AST 372* 304* 131*  ALT 373* 918* 601*  ALKPHOS 97 90 79  BILITOT 0.6 1.6* 1.4*  PROT 6.4* 5.4* 5.3*  ALBUMIN 3.3* 2.8* 2.7*   Recent Labs  Lab 06/29/19 1329  LIPASE 48   Recent Labs  Lab 06/29/19 1527  AMMONIA 29   CBC: Recent Labs  Lab 06/29/19 1329 06/30/19 0000 06/30/19 0500 07/01/19 0830 07/03/19 0602  WBC 10.7* 28.6* 26.1* 16.2* 11.3*  NEUTROABS 8.9*  --   --   --   --    HGB 16.5 16.7 16.3 15.0 15.2  HCT 54.0* 51.4 50.2 45.6 47.6  MCV 108.9* 101.2* 101.2* 99.3 99.8  PLT 331 330 314 214 201   Cardiac Enzymes: No results for input(s): CKTOTAL, CKMB, CKMBINDEX, TROPONINI in the last 168 hours. BNP: Invalid input(s): POCBNP CBG: No results for input(s): GLUCAP in the last 168 hours. D-Dimer No results for input(s): DDIMER in the last 72 hours. Hgb A1c No results for input(s): HGBA1C in the last 72 hours. Lipid Profile No results for input(s): CHOL, HDL, LDLCALC, TRIG, CHOLHDL, LDLDIRECT in the last 72 hours. Thyroid function studies No results for input(s): TSH, T4TOTAL, T3FREE, THYROIDAB in the last 72 hours.  Invalid input(s): FREET3 Anemia work up No results for input(s): VITAMINB12, FOLATE, FERRITIN, TIBC, IRON, RETICCTPCT in the last 72 hours. Urinalysis    Component Value Date/Time   COLORURINE YELLOW 06/29/2019 1546   APPEARANCEUR CLOUDY (A) 06/29/2019 1546   LABSPEC 1.024 06/29/2019 1546   PHURINE 5.0 06/29/2019 1546   GLUCOSEU NEGATIVE 06/29/2019 1546   HGBUR SMALL (A) 06/29/2019 1546   BILIRUBINUR SMALL (A) 06/29/2019 1546   KETONESUR NEGATIVE 06/29/2019 1546   PROTEINUR 100 (A) 06/29/2019 1546   NITRITE NEGATIVE 06/29/2019 1546   LEUKOCYTESUR NEGATIVE 06/29/2019 1546    Microbiology Recent Results (from the past 240 hour(s))  Respiratory Panel by RT PCR (Flu A&B, Covid) - Nasopharyngeal Swab     Status: None   Collection Time: 06/29/19  2:52 PM   Specimen: Nasopharyngeal Swab  Result Value Ref Range Status   SARS Coronavirus 2 by RT PCR NEGATIVE NEGATIVE Final    Comment: (NOTE) SARS-CoV-2 target nucleic acids are NOT DETECTED. The SARS-CoV-2 RNA is generally detectable in upper respiratoy specimens during the acute phase of infection. The lowest concentration of SARS-CoV-2 viral copies this assay can detect is 131 copies/mL. A negative result does not preclude SARS-Cov-2 infection and should not be used as the sole basis  for treatment or other patient management decisions. A negative result may occur with  improper specimen collection/handling, submission of specimen other than nasopharyngeal swab, presence of viral mutation(s) within the areas targeted by this assay, and inadequate number of viral copies (<131 copies/mL). A negative result must be combined with clinical observations, patient history, and epidemiological information. The expected result is Negative. Fact Sheet for Patients:  PinkCheek.be Fact Sheet for Healthcare Providers:  GravelBags.it This test  is not yet ap proved or cleared by the [place] and  has been authorized for detection and/or diagnosis of SARS-CoV-2 by FDA under an Emergency Use Authorization (EUA). This EUA will remain  in effect (meaning this test can be used) for the duration of the COVID-19 declaration under Section 564(b)(1) of the Act, 21 U.S.C. section 360bbb-3(b)(1), unless the authorization is terminated or revoked sooner.    Influenza A by PCR NEGATIVE NEGATIVE Final   Influenza B by PCR NEGATIVE NEGATIVE Final    Comment: (NOTE) The Xpert Xpress SARS-CoV-2/FLU/RSV assay is intended as an aid in  the diagnosis of influenza from Nasopharyngeal swab specimens and  should not be used as a sole basis for treatment. Nasal washings and  aspirates are unacceptable for Xpert Xpress SARS-CoV-2/FLU/RSV  testing. Fact Sheet for Patients: PinkCheek.be Fact Sheet for Healthcare Providers: GravelBags.it This test is not yet approved or cleared by the [place] FDA and  has been authorized for detection and/or diagnosis of SARS-CoV-2 by  FDA under an Emergency Use Authorization (EUA). This EUA will remain  in effect (meaning this test can be used) for the duration of the  Covid-19 declaration under Section 564(b)(1) of the Act, 21  U.S.C.  section 360bbb-3(b)(1), unless the authorization is  terminated or revoked. Performed at [facility], [address], [place], [place] [address]   Blood culture (routine x 2)     Status: None   Collection Time: 06/29/19  3:28 PM   Specimen: Left Antecubital; Blood  Result Value Ref Range Status   Specimen Description LEFT ANTECUBITAL  Final   Special Requests   Final    BOTTLES DRAWN AEROBIC AND ANAEROBIC Blood Culture adequate volume   Culture   Final    NO GROWTH 5 DAYS Performed at [facility], [address], [place], [place] [address]    Report Status 07/04/2019 FINAL  Final  Blood culture (routine x 2)     Status: Abnormal   Collection Time: 06/29/19  3:33 PM   Specimen: BLOOD LEFT HAND  Result Value Ref Range Status   Specimen Description   Final    BLOOD LEFT HAND Performed at [facility], [address], [place], [place] [address]    Special Requests   Final    BOTTLES DRAWN AEROBIC AND ANAEROBIC Blood Culture results may not be optimal due to an inadequate volume of blood received in culture bottles Performed at [facility] [address], [place], [place] [address]    Culture  Setup Time   Final    GRAM POSITIVE COCCI ANAEROBIC BOTTLE ONLY Gram Stain Report Called to,Read Back By and Verified With: FRANKLIN S. AT [place] BY THOMPSON S. Performed at [facility], [address], [place], [place] [address]    Culture (A)  Final    VIRIDANS STREPTOCOCCUS THE SIGNIFICANCE OF ISOLATING THIS ORGANISM FROM A SINGLE SET OF BLOOD CULTURES WHEN MULTIPLE SETS ARE DRAWN IS UNCERTAIN. PLEASE NOTIFY THE MICROBIOLOGY DEPARTMENT WITHIN ONE WEEK IF SPECIATION AND SENSITIVITIES ARE REQUIRED. Performed at [facility], [place] [address] [place], [place] [address]    Report Status 07/02/2019 FINAL  Final  MRSA PCR Screening     Status: None   Collection Time: 06/29/19 11:29 PM   Specimen: Nasal Mucosa; Nasopharyngeal  Result Value Ref Range Status   MRSA by PCR NEGATIVE  NEGATIVE Final    Comment:        The GeneXpert MRSA Assay (FDA approved  for NASAL specimens only), is one component of a comprehensive MRSA colonization surveillance program. It is not intended to diagnose MRSA infection nor to guide or monitor treatment for MRSA infections. Performed at [facility], [place] [address] [place], [place] [address]   Respiratory Panel by RT PCR (Flu A&B, Covid) - Nasopharyngeal Swab     Status: None   Collection Time: 07/04/19 10:00 AM   Specimen: Nasopharyngeal Swab  Result Value Ref Range Status   SARS Coronavirus 2 by RT PCR NEGATIVE NEGATIVE Final    Comment: (NOTE) SARS-CoV-2 target nucleic acids are NOT DETECTED. The SARS-CoV-2 RNA is generally detectable in upper respiratoy specimens during the acute phase of infection. The lowest concentration of SARS-CoV-2 viral copies this assay can detect is 131 copies/mL. A negative result does not preclude SARS-Cov-2 infection and should not be used as the sole basis for treatment or other patient management decisions. A negative result may occur with  improper specimen collection/handling, submission of specimen other than nasopharyngeal swab, presence of viral mutation(s) within the areas targeted by this assay, and inadequate number of viral copies (<131 copies/mL). A negative result must be combined with clinical observations, patient history, and epidemiological information. The expected result is Negative. Fact Sheet for Patients:  PinkCheek.be Fact Sheet for Healthcare Providers:  GravelBags.it This test is not yet ap proved or cleared by the [place] FDA and  has been authorized for detection and/or diagnosis of SARS-CoV-2 by FDA under an Emergency Use Authorization (EUA). This EUA will remain  in effect (meaning this test can be used) for the duration of the COVID-19 declaration under Section 564(b)(1) of the  Act, 21 U.S.C. section 360bbb-3(b)(1), unless the authorization is terminated or revoked sooner.    Influenza A by PCR NEGATIVE NEGATIVE Final   Influenza B by PCR NEGATIVE NEGATIVE Final    Comment: (NOTE) The Xpert Xpress SARS-CoV-2/FLU/RSV assay is intended as an aid in  the diagnosis of influenza from Nasopharyngeal swab specimens and  should not be used as a sole basis for treatment. Nasal washings and  aspirates are unacceptable for Xpert Xpress SARS-CoV-2/FLU/RSV  testing. Fact Sheet for Patients: PinkCheek.be Fact Sheet for Healthcare Providers: GravelBags.it This test is not yet approved or cleared by the [place] FDA and  has been authorized for detection and/or diagnosis of SARS-CoV-2 by  FDA under an Emergency Use Authorization (EUA). This EUA will remain  in effect (meaning this test can be used) for the duration of the  Covid-19 declaration under Section 564(b)(1) of the Act, 21  U.S.C. section 360bbb-3(b)(1), unless the authorization is  terminated or revoked. Performed at [facility], [place] [address] [place], [place] [address]     Time coordinating discharge: Approximately 40 minutes  Patrecia Pour, MD  [facility] 07/04/2019, 1:26 PM

## 2019-07-04 NOTE — TOC Transition Note (Signed)
Transition of Care [facility]) - CM/SW Discharge Note   Patient Details  Name: Antonio Ramirez MRN: [id number] Date of Birth: 1960/05/16  Transition of Care [facility]) CM/SW Contact:  Trish Mage, LCSW Phone Number: 07/04/2019, 1:25 PM   Clinical Narrative:   Patient to transfer to [facility] today. PTAR set up.  Nursing, please call report to [phone], room 518. TOC sign off.    Final next level of care: Skilled Nursing Facility Barriers to Discharge: No Barriers Identified   Patient Goals and CMS Choice Patient states their goals for this hospitalization and ongoing recovery are:: "I want to go home.  I already have rehab at home." CMS Medicare.gov Compare Post Acute Care list provided to:: Patient Choice offered to / list presented to : Patient, Spouse  Discharge Placement                       Discharge Plan and Services   Discharge Planning Services: CM Consult Post Acute Care Choice: [facility]                               Social Determinants of Health (SDOH) Interventions     Readmission Risk Interventions No flowsheet data found.

## 2019-07-04 NOTE — Care Management Important Message (Signed)
Important Message  Patient Details IM Letter given to Roque Lias SW Case Manager to present to the Patient Name: Antonio Ramirez MRN: [id number] Date of Birth: 1961-03-23   Medicare Important Message Given:  Yes     Kerin Salen 07/04/2019, 11:23 AM

## 2019-07-04 NOTE — Progress Notes (Signed)
   07/04/19 1600  What Happened  Was fall witnessed? Yes  Who witnessed fall? Kat Roesal NT  Patients activity before fall ambulating-assisted  Point of contact buttocks  Was patient injured? No  Adult Fall Risk Assessment  Risk Factor Category (scoring not indicated) Not Applicable  Age 58  Fall History: Fall within 6 months prior to admission 5  Elimination; Bowel and/or Urine Incontinence 2  Elimination; Bowel and/or Urine Urgency/Frequency 2  Medications: includes PCA/Opiates, Anti-convulsants, Anti-hypertensives, Diuretics, Hypnotics, Laxatives, Sedatives, and Psychotropics 3  Patient Care Equipment 0  Mobility-Assistance 2  Mobility-Gait 2  Mobility-Sensory Deficit 2  Altered awareness of immediate physical environment 1  Impulsiveness 2  Lack of understanding of one's physical/cognitive limitations 4  Total Score 25  Patient Fall Risk Level High fall risk  Adult Fall Risk Interventions  Required Bundle Interventions *See Row Information* High fall risk - low, moderate, and high requirements implemented  Additional Interventions Room near nurses station;Use of appropriate toileting equipment (bedpan, BSC, etc.);Lap belt while in chair/wheelchair  Screening for Fall Injury Risk (To be completed on HIGH fall risk patients) - Assessing Need for Low Bed  Risk For Fall Injury- Low Bed Criteria None identified - Continue screening  Screening for Fall Injury Risk (To be completed on HIGH fall risk patients who do not meet crieteria for Low Bed) - Assessing Need for Floor Mats Only  Risk For Fall Injury- Criteria for Floor Mats None identified - No additional interventions needed  Pain Assessment  Pain Scale 0-10  Pain Score 0  Critical Care Pain Observation Tool (CPOT)  Facial Expression 0  Body Movements 0  Muscle Tension 0  Compliance with ventilator (intubated pts.) N/A  Vocalization (extubated pts.) N/A  CPOT Total 0  Neurological  Level of Consciousness Alert  Orientation  Level Oriented X4  Cognition Follows commands  Musculoskeletal  Musculoskeletal (WDL) X  Assistive Device BSC  Generalized Weakness Yes  Weight Bearing Restrictions No

## 2019-07-13 NOTE — Progress Notes (Deleted)
Cardiology Office Note    Date:  07/13/2019   ID:  Antonio Ramirez, DOB 1961/01/20, MRN [id number]  PCP:  [name], DO  Cardiologist: Kate Sable, MD    No chief complaint on file.   History of Present Illness:    Antonio Ramirez is a 59 y.o. male with past medical history of CAD (s/p CABG in 2006 but details unavailable by review of the chart), HLD, chronic back pain and tobacco use who presents to the office today for hospital follow-up.  He was recently admitted to [facility] on 06/01/2019 for evaluation of altered mental status. He was found to have acute hypoxic respiratory failure while in the ED and there was concern about possible overdose of pain medication along with known COD exacerbation. Cardiology was consulted as HS Troponin values were found to be elevated, peaking at 982.  An echocardiogram was performed which showed a preserved EF and no regional wall motion normalities. He did have moderate RV dilatation and moderately reduced RV systolic function.  He presented back to the ED on 06/29/2019 for evaluation of recurrent altered mental status which improved with administration of Narcan.  He required intubation upon admission. Nephrology was consulted given his AKI his creatinine was elevated to 2.38 on admission had improved to 0.64 at the time of discharge. Cardiology was consulted as well given HS Troponin elevation, peaking at 1566.  Enzyme elevation was again thought to be secondary to demand ischemia and continued risk factor modification was recommended.   Past Medical History:  Diagnosis Date  . CAD (coronary artery disease)   . COPD (chronic obstructive pulmonary disease) ([place])   . Opioid dependence ([place])   . Smoker     Past Surgical History:  Procedure Laterality Date  . cardiac bypass    . CARDIAC SURGERY    . COLON SURGERY      Current Medications: Outpatient Medications Prior to Visit  Medication Sig Dispense Refill  . albuterol  (VENTOLIN HFA) 108 (90 Base) MCG/ACT inhaler Inhale 1-2 puffs into the lungs every 6 (six) hours as needed for wheezing or shortness of breath. 18 g 1  . AMITIZA 24 MCG capsule Take 24 mcg by mouth 2 (two) times daily with a meal.    . aspirin EC 81 MG tablet Take 1 tablet (81 mg total) by mouth daily.    Marland Kitchen atorvastatin (LIPITOR) 40 MG tablet Take 1 tablet (40 mg total) by mouth daily at 6 PM. 30 tablet 1  . calcitRIOL (ROCALTROL) 0.5 MCG capsule Take 0.5 mcg by mouth daily.    . citalopram (CELEXA) 10 MG tablet Take 1 tablet (10 mg total) by mouth daily.    . clopidogrel (PLAVIX) 75 MG tablet Take 75 mg by mouth daily.    Marland Kitchen gabapentin (NEURONTIN) 600 MG tablet Take 600 mg by mouth at bedtime.     . isosorbide mononitrate (IMDUR) 30 MG 24 hr tablet Take 30 mg by mouth daily.     Marland Kitchen omeprazole (PRILOSEC) 20 MG capsule Take 20 mg by mouth daily before breakfast. 30 min prior to breakfast.    . oxyCODONE (ROXICODONE) 15 MG immediate release tablet Take 1 tablet (15 mg total) by mouth 3 (three) times daily as needed for pain. 9 tablet 0  . polyethylene glycol (MIRALAX / GLYCOLAX) packet Take 17 g by mouth daily. (Patient taking differently: Take 17 g by mouth daily as needed for mild constipation or moderate constipation. ) 14 each 0  .  senna-docusate (SENOKOT-S) 8.6-50 MG tablet Take 2 tablets by mouth at bedtime as needed for mild constipation.    . SYMBICORT 160-4.5 MCG/ACT inhaler Inhale 2 puffs into the lungs 2 (two) times daily.    Marland Kitchen XTAMPZA ER 36 MG C12A Take 1 capsule (36 mg total) by mouth every 12 (twelve) hours. 6 capsule 0   No facility-administered medications prior to visit.     Allergies:   Opana [oxymorphone hcl]   Social History   Socioeconomic History  . Marital status: Divorced    Spouse name: Not on file  . Number of children: Not on file  . Years of education: Not on file  . Highest education level: Not on file  Occupational History  . Not on file  Tobacco Use  .  Smoking status: Current Every Day Smoker    Packs/day: 1.00    Types: Cigarettes  . Smokeless tobacco: Never Used  Substance and Sexual Activity  . Alcohol use: No  . Drug use: No  . Sexual activity: Not on file  Other Topics Concern  . Not on file  Social History Narrative  . Not on file   Social Determinants of Health   Financial Resource Strain:   . Difficulty of Paying Living Expenses:   Food Insecurity:   . Worried About Charity fundraiser in the Last Year:   . Arboriculturist in the Last Year:   Transportation Needs:   . Film/video editor (Medical):   Marland Kitchen Lack of Transportation (Non-Medical):   Physical Activity:   . Days of Exercise per Week:   . Minutes of Exercise per Session:   Stress:   . Feeling of Stress :   Social Connections:   . Frequency of Communication with Friends and Family:   . Frequency of Social Gatherings with Friends and Family:   . Attends Religious Services:   . Active Member of Clubs or Organizations:   . Attends Archivist Meetings:   Marland Kitchen Marital Status:      Family History:  The patient's ***family history includes COPD in his mother.   Review of Systems:   Please see the history of present illness.     General:  No chills, fever, night sweats or weight changes.  Cardiovascular:  No chest pain, dyspnea on exertion, edema, orthopnea, palpitations, paroxysmal nocturnal dyspnea. Dermatological: No rash, lesions/masses Respiratory: No cough, dyspnea Urologic: No hematuria, dysuria Abdominal:   No nausea, vomiting, diarrhea, bright red blood per rectum, melena, or hematemesis Neurologic:  No visual changes, wkns, changes in mental status. All other systems reviewed and are otherwise negative except as noted above.   Physical Exam:    VS:  There were no vitals taken for this visit.   General: Well developed, well nourished,male appearing in no acute distress. Head: Normocephalic, atraumatic, sclera non-icteric.  Neck: No  carotid bruits. JVD not elevated.  Lungs: Respirations regular and unlabored, without wheezes or rales.  Heart: ***Regular rate and rhythm. No S3 or S4.  No murmur, no rubs, or gallops appreciated. Abdomen: Soft, non-tender, non-distended. No obvious abdominal masses. Msk:  Strength and tone appear normal for age. No obvious joint deformities or effusions. Extremities: No clubbing or cyanosis. No edema.  Distal pedal pulses are 2+ bilaterally. Neuro: Alert and oriented X 3. Moves all extremities spontaneously. No focal deficits noted. Psych:  Responds to questions appropriately with a normal affect. Skin: No rashes or lesions noted  Wt Readings from Last 3 Encounters:  07/04/19 185 lb 10 oz (84.2 kg)  06/01/19 170 lb 10.2 oz (77.4 kg)  02/11/17 180 lb (81.6 kg)        Studies/Labs Reviewed:   EKG:  EKG is*** ordered today.  The ekg ordered today demonstrates ***  Recent Labs: 06/01/2019: TSH 0.447 06/03/2019: Magnesium 2.0 06/29/2019: B Natriuretic Peptide 1,099.0 07/03/2019: Hemoglobin 15.2; Platelets 201 07/04/2019: ALT 601; BUN 19; Creatinine, Ser 0.64; Potassium 3.9; Sodium 139   Lipid Panel    Component Value Date/Time   CHOL 179 06/01/2019 0733   TRIG 105 06/30/2019 0118   HDL 46 06/01/2019 0733   CHOLHDL 3.9 06/01/2019 0733   VLDL 20 06/01/2019 0733   LDLCALC 113 (H) 06/01/2019 0733    Additional studies/ records that were reviewed today include:   Echocardiogram: 05/2019 IMPRESSIONS    1. Left ventricular ejection fraction, by estimation, is 55 to 60%. The  left ventricle has normal function. The left ventricle demonstrates  regional wall motion abnormalities (see scoring diagram/findings for  description). There is mild asymmetric left  ventricular hypertrophy of the posterior segment. Left ventricular  diastolic parameters were normal. There is septal dysnergy due to  conduction delay and the interventricular septum is flattened in systole,  consistent with  right ventricular pressure  overload.  2. Right ventricular systolic function is moderately reduced. The right  ventricular size is moderately enlarged. There is normal pulmonary artery  systolic pressure.  3. The mitral valve is grossly normal. No evidence of mitral valve  regurgitation.  4. The aortic valve is tricuspid. Aortic valve regurgitation is not  visualized. No aortic stenosis is present.  5. The inferior vena cava is dilated in size with >50% respiratory  variability, suggesting right atrial pressure of 8 mmHg.   Assessment:    No diagnosis found.   Plan:   In order of problems listed above:  1. ***    Medication Adjustments/Labs and Tests Ordered: Current medicines are reviewed at length with the patient today.  Concerns regarding medicines are outlined above.  Medication changes, Labs and Tests ordered today are listed in the Patient Instructions below. There are no Patient Instructions on file for this visit.   Signed, Erma Heritage, PA-C  07/13/2019 12:52 PM    [facility] [address] [place], [place] [address] Phone: [phone] Fax: [phone]

## 2019-07-14 ENCOUNTER — Ambulatory Visit: Payer: Medicare Other | Admitting: Student

## 2019-07-15 ENCOUNTER — Encounter: Payer: Self-pay | Admitting: Student

## 2019-12-16 ENCOUNTER — Emergency Department (HOSPITAL_COMMUNITY): Payer: Medicare Other

## 2019-12-16 ENCOUNTER — Emergency Department (HOSPITAL_COMMUNITY)
Admission: EM | Admit: 2019-12-16 | Discharge: 2019-12-16 | Disposition: A | Discharge status: Home / Self Care | Payer: Medicare Other | Attending: Emergency Medicine | Admitting: Emergency Medicine

## 2019-12-16 ENCOUNTER — Encounter (HOSPITAL_COMMUNITY): Payer: Self-pay

## 2019-12-16 ENCOUNTER — Other Ambulatory Visit: Payer: Self-pay

## 2019-12-16 DIAGNOSIS — M7989 Other specified soft tissue disorders: Secondary | ICD-10-CM | POA: Diagnosis not present

## 2019-12-16 DIAGNOSIS — Z8673 Personal history of transient ischemic attack (TIA), and cerebral infarction without residual deficits: Secondary | ICD-10-CM | POA: Diagnosis not present

## 2019-12-16 DIAGNOSIS — Z20822 Contact with and (suspected) exposure to covid-19: Secondary | ICD-10-CM | POA: Insufficient documentation

## 2019-12-16 DIAGNOSIS — I2510 Atherosclerotic heart disease of native coronary artery without angina pectoris: Secondary | ICD-10-CM | POA: Diagnosis not present

## 2019-12-16 DIAGNOSIS — Z7982 Long term (current) use of aspirin: Secondary | ICD-10-CM | POA: Insufficient documentation

## 2019-12-16 DIAGNOSIS — F17210 Nicotine dependence, cigarettes, uncomplicated: Secondary | ICD-10-CM | POA: Insufficient documentation

## 2019-12-16 DIAGNOSIS — J441 Chronic obstructive pulmonary disease with (acute) exacerbation: Secondary | ICD-10-CM | POA: Diagnosis not present

## 2019-12-16 DIAGNOSIS — Z79899 Other long term (current) drug therapy: Secondary | ICD-10-CM | POA: Diagnosis not present

## 2019-12-16 DIAGNOSIS — N179 Acute kidney failure, unspecified: Secondary | ICD-10-CM | POA: Diagnosis not present

## 2019-12-16 DIAGNOSIS — R000 Tachycardia, unspecified: Secondary | ICD-10-CM | POA: Diagnosis not present

## 2019-12-16 DIAGNOSIS — R0902 Hypoxemia: Secondary | ICD-10-CM | POA: Diagnosis not present

## 2019-12-16 DIAGNOSIS — R0602 Shortness of breath: Secondary | ICD-10-CM | POA: Diagnosis present

## 2019-12-16 DIAGNOSIS — Z7902 Long term (current) use of antithrombotics/antiplatelets: Secondary | ICD-10-CM | POA: Insufficient documentation

## 2019-12-16 DIAGNOSIS — Z951 Presence of aortocoronary bypass graft: Secondary | ICD-10-CM | POA: Diagnosis not present

## 2019-12-16 LAB — CBC WITH DIFFERENTIAL/PLATELET
Abs Immature Granulocytes: 0.03 10*3/uL (ref 0.00–0.07)
Basophils Absolute: 0 10*3/uL (ref 0.0–0.1)
Basophils Relative: 0 %
Eosinophils Absolute: 0 10*3/uL (ref 0.0–0.5)
Eosinophils Relative: 0 %
HCT: 55 % — ABNORMAL HIGH (ref 39.0–52.0)
Hemoglobin: 16.8 g/dL (ref 13.0–17.0)
Immature Granulocytes: 0 %
Lymphocytes Relative: 6 %
Lymphs Abs: 0.6 10*3/uL — ABNORMAL LOW (ref 0.7–4.0)
MCH: 31.3 pg (ref 26.0–34.0)
MCHC: 30.5 g/dL (ref 30.0–36.0)
MCV: 102.6 fL — ABNORMAL HIGH (ref 80.0–100.0)
Monocytes Absolute: 0.5 10*3/uL (ref 0.1–1.0)
Monocytes Relative: 5 %
Neutro Abs: 7.8 10*3/uL — ABNORMAL HIGH (ref 1.7–7.7)
Neutrophils Relative %: 89 %
Platelets: 291 10*3/uL (ref 150–400)
RBC: 5.36 MIL/uL (ref 4.22–5.81)
RDW: 14.7 % (ref 11.5–15.5)
WBC: 8.8 10*3/uL (ref 4.0–10.5)
nRBC: 0 % (ref 0.0–0.2)

## 2019-12-16 LAB — BASIC METABOLIC PANEL
Anion gap: 20 — ABNORMAL HIGH (ref 5–15)
BUN: 35 mg/dL — ABNORMAL HIGH (ref 6–20)
CO2: 27 mmol/L (ref 22–32)
Calcium: 11.3 mg/dL — ABNORMAL HIGH (ref 8.9–10.3)
Chloride: 93 mmol/L — ABNORMAL LOW (ref 98–111)
Creatinine, Ser: 1.69 mg/dL — ABNORMAL HIGH (ref 0.61–1.24)
GFR calc Af Amer: 50 mL/min — ABNORMAL LOW (ref >60)
GFR calc non Af Amer: 43 mL/min — ABNORMAL LOW (ref >60)
Glucose, Bld: 165 mg/dL — ABNORMAL HIGH (ref 70–99)
Potassium: 3.9 mmol/L (ref 3.5–5.1)
Sodium: 140 mmol/L (ref 135–145)

## 2019-12-16 LAB — SARS CORONAVIRUS 2 BY RT PCR (HOSPITAL ORDER, PERFORMED IN ~~LOC~~ HOSPITAL LAB): SARS Coronavirus 2: NEGATIVE

## 2019-12-16 LAB — TROPONIN I (HIGH SENSITIVITY)
Troponin I (High Sensitivity): 137 ng/L (ref <18)
Troponin I (High Sensitivity): 143 ng/L (ref <18)

## 2019-12-16 LAB — D-DIMER, QUANTITATIVE: D-Dimer, Quant: 0.3 ug/mL-FEU (ref 0.00–0.50)

## 2019-12-16 LAB — BRAIN NATRIURETIC PEPTIDE: B Natriuretic Peptide: 495 pg/mL — ABNORMAL HIGH (ref 0.0–100.0)

## 2019-12-16 MED ORDER — SODIUM CHLORIDE 0.9 % IV BOLUS
500.0000 mL | Freq: Once | INTRAVENOUS | Status: AC
  Administered 2019-12-16: 500 mL via INTRAVENOUS

## 2019-12-16 NOTE — ED Triage Notes (Signed)
Pt brought to ED via [place] for SOB. EMS called out, pt was at pain clinic, upon arrival pt O2 sat 80-84%. Pt received 3 Duoneb, 7 albuterol neb, Solumedrol 125 mg and Magnesium 2 g PTA.

## 2019-12-16 NOTE — Discharge Instructions (Addendum)
It appears you need to have your OXYGEN on at all times as your oxygen levels are too low without it. I would recommend 4L at all times.   You will need to return for an ultrasound of your left leg tomorrow - please call the number on your discharge paperwork to schedule an appointment. This is to rule out a blood clot in your leg.   Your kidney function was mildly elevated today. This is likely due to dehydration. Please increase you water intake and have your kidney function rechecked in 1 week.   Return to the ED for any worsening symptoms

## 2019-12-16 NOTE — ED Notes (Addendum)
Date and time results received: 12/16/19 8:05 PM    Test: troponin Critical Value: 137  Name of Provider Notified: PA Alroy Bailiff  Orders Received? Or Actions Taken?: See orders

## 2019-12-16 NOTE — ED Provider Notes (Signed)
[facility] EMERGENCY DEPARTMENT Provider Note   CSN: [id number] Arrival date & time: 12/16/19  1716     History Chief Complaint  Patient presents with  . Shortness of Breath    Antonio Ramirez is a 59 y.o. male with PMHx CAD, COPD, current every day smoker, on Plavix who presents to the ED today via EMS for SOB. Per triage report pt brought to ED via [facility]; EMS was called out while pt was at his pain clinic. He was noted to be hypoxic with O2 sats 80-84%. Pt received 3 rounds of duoneb, 7 albuterol nebs, solumedrol 125 mg, and Mag 2g PTA. Pt reports he felt fine while going to the pain clinic today and denies shortness of breath or any complaints currently. When asked if he is normally on oxygen at home pt states "only when I need to be." He cannot tell me the amount of oxygen he will use from time to time. Pt denies any recent sick contacts. Has has been vaccinated against COVID - he believes he had his second dose last month however friend on the phone provides additional information and states that his second dose was in May. She states pt has been fine at home; he has been dealing with some LLE swelling for about 1.5 months; no pain. She states that they keep going to the doctor to ask for fluid pills however "they won't give him any."   The history is provided by the patient, a friend and medical records.       Past Medical History:  Diagnosis Date  . CAD (coronary artery disease)   . COPD (chronic obstructive pulmonary disease) ([place])   . Opioid dependence ([place])   . Smoker     Patient Active Problem List   Diagnosis Date Noted  . Pressure injury of skin 06/30/2019  . Sepsis ([place]) 06/29/2019  . Acute respiratory failure with hypoxia ([place]) 06/01/2019  . COPD (chronic obstructive pulmonary disease) ([place])   . COPD with acute exacerbation ([place])   . Current every day smoker   . Opioid dependence ([place])   . Depression   . Hyperlipidemia   . CAD (coronary artery  disease)   . Elevated troponin   . AKI (acute kidney injury) ([place])   . Hyperglycemia   . Tobacco abuse   . Demand ischemia of myocardium ([place])   . Hyperlipidemia LDL goal <70     Past Surgical History:  Procedure Laterality Date  . cardiac bypass    . CARDIAC SURGERY    . COLON SURGERY         Family History  Problem Relation Age of Onset  . COPD Mother     Social History   Tobacco Use  . Smoking status: Current Every Day Smoker    Packs/day: 1.00    Types: Cigarettes  . Smokeless tobacco: Never Used  Substance Use Topics  . Alcohol use: No  . Drug use: No    Home Medications Prior to Admission medications   Medication Sig Start Date End Date Taking? Authorizing Provider  albuterol (VENTOLIN HFA) 108 (90 Base) MCG/ACT inhaler Inhale 1-2 puffs into the lungs every 6 (six) hours as needed for wheezing or shortness of breath. 06/03/19   Murlean Iba, MD  AMITIZA 24 MCG capsule Take 24 mcg by mouth 2 (two) times daily with a meal. 05/17/19   [provider]  aspirin EC 81 MG tablet Take 1 tablet (81 mg total) by mouth  daily. 06/03/19   Murlean Iba, MD  atorvastatin (LIPITOR) 40 MG tablet Take 1 tablet (40 mg total) by mouth daily at 6 PM. 06/03/19   Johnson, Clanford L, MD  calcitRIOL (ROCALTROL) 0.5 MCG capsule Take 0.5 mcg by mouth daily. 03/16/19   [provider]  citalopram (CELEXA) 10 MG tablet Take 1 tablet (10 mg total) by mouth daily. 07/04/19   Patrecia Pour, MD  clopidogrel (PLAVIX) 75 MG tablet Take 75 mg by mouth daily. 06/27/19   [provider]  gabapentin (NEURONTIN) 600 MG tablet Take 600 mg by mouth at bedtime.  05/27/19   [provider]  isosorbide mononitrate (IMDUR) 30 MG 24 hr tablet Take 30 mg by mouth daily.  01/22/17   [provider]  omeprazole (PRILOSEC) 20 MG capsule Take 20 mg by mouth daily before breakfast. 30 min prior to breakfast. 04/13/19   [provider]  oxyCODONE  (ROXICODONE) 15 MG immediate release tablet Take 1 tablet (15 mg total) by mouth 3 (three) times daily as needed for pain. 07/04/19   Patrecia Pour, MD  polyethylene glycol (MIRALAX / Floria Raveling) packet Take 17 g by mouth daily. Patient taking differently: Take 17 g by mouth daily as needed for mild constipation or moderate constipation.  02/11/17   Long, Wonda Olds, MD  senna-docusate (SENOKOT-S) 8.6-50 MG tablet Take 2 tablets by mouth at bedtime as needed for mild constipation. 06/03/19   Johnson, Eldridge Dace, MD  SYMBICORT 160-4.5 MCG/ACT inhaler Inhale 2 puffs into the lungs 2 (two) times daily. 04/29/19   [provider]  XTAMPZA ER 36 MG C12A Take 1 capsule (36 mg total) by mouth every 12 (twelve) hours. 07/04/19   Patrecia Pour, MD    Allergies    Opana [oxymorphone hcl]  Review of Systems   Review of Systems  Constitutional: Negative for chills and fever.  Respiratory: Negative for cough and shortness of breath.   Cardiovascular: Positive for leg swelling. Negative for chest pain.  All other systems reviewed and are negative.   Physical Exam Updated Vital Signs BP 121/75   Pulse (!) 103   Temp (!) 97.5 F (36.4 C) (Oral)   Resp 16   Ht 6\' 1"  (1.854 m)   Wt 80.3 kg   SpO2 96%   BMI 23.35 kg/m   Physical Exam Vitals and nursing note reviewed.  Constitutional:      Appearance: He is not ill-appearing or diaphoretic.  HENT:     Head: Normocephalic and atraumatic.  Eyes:     Conjunctiva/sclera: Conjunctivae normal.  Cardiovascular:     Rate and Rhythm: Regular rhythm. Tachycardia present.     Pulses: Normal pulses.  Pulmonary:     Effort: Pulmonary effort is normal.     Breath sounds: Decreased breath sounds present. No wheezing, rhonchi or rales.  Abdominal:     Palpations: Abdomen is soft.     Tenderness: There is no abdominal tenderness. There is no guarding or rebound.  Musculoskeletal:     Cervical back: Neck supple.     Left lower leg: No tenderness. Edema  present.  Skin:    General: Skin is warm and dry.  Neurological:     Mental Status: He is alert.     ED Results / Procedures / Treatments   Labs (all labs ordered are listed, but only abnormal results are displayed) Labs Reviewed  BASIC METABOLIC PANEL - Abnormal; Notable for the following components:  Result Value   Chloride 93 (*)    Glucose, Bld 165 (*)    BUN 35 (*)    Creatinine, Ser 1.69 (*)    Calcium 11.3 (*)    GFR calc non Af Amer 43 (*)    GFR calc Af Amer 50 (*)    Anion gap 20 (*)    All other components within normal limits  CBC WITH DIFFERENTIAL/PLATELET - Abnormal; Notable for the following components:   HCT 55.0 (*)    MCV 102.6 (*)    Neutro Abs 7.8 (*)    Lymphs Abs 0.6 (*)    All other components within normal limits  BRAIN NATRIURETIC PEPTIDE - Abnormal; Notable for the following components:   B Natriuretic Peptide 495.0 (*)    All other components within normal limits  TROPONIN I (HIGH SENSITIVITY) - Abnormal; Notable for the following components:   Troponin I (High Sensitivity) 137 (*)    All other components within normal limits  TROPONIN I (HIGH SENSITIVITY) - Abnormal; Notable for the following components:   Troponin I (High Sensitivity) 143 (*)    All other components within normal limits  SARS CORONAVIRUS 2 BY RT PCR [facility] ORDER, Josephine LAB)  D-DIMER, QUANTITATIVE (NOT AT [facility])    EKG EKG Interpretation  Date/Time:  Friday December 16 2019 17:35:07 EDT Ventricular Rate:  104 PR Interval:    QRS Duration: 149 QT Interval:  364 QTC Calculation: 479 R Axis:   106 Text Interpretation: Sinus tachycardia Right atrial enlargement Right bundle branch block Abnormal lateral Q waves ST depr, consider ischemia, inferior leads Confirmed by Fredia Sorrow [phone]) on 12/16/2019 5:40:29 PM   Radiology DG Chest Portable 1 View  Result Date: 12/16/2019 CLINICAL DATA:  Shortness of breath EXAM: PORTABLE CHEST 1  VIEW COMPARISON:  07/03/2019 FINDINGS: Large lung volumes favoring emphysema. Prior median sternotomy. Atherosclerotic calcification of the aortic arch. Heart size within normal limits. Subtle accentuation of interstitial markings. No airspace opacity is identified. No blunting of the costophrenic angles. IMPRESSION: 1. Chronic mild interstitial accentuation and suspicion for underlying emphysema. 2. Prior CABG. 3.  Aortic Atherosclerosis (ICD10-I70.0). Electronically Signed   By: Van Clines M.D.   On: 12/16/2019 18:07    Procedures Procedures (including critical care time)  Medications Ordered in ED Medications  sodium chloride 0.9 % bolus 500 mL (500 mLs Intravenous New Bag/Given 12/16/19 2123)    ED Course  I have reviewed the triage vital signs and the nursing notes.  Pertinent labs & imaging results that were available during my care of the patient were reviewed by me and considered in my medical decision making (see chart for details).    MDM Rules/Calculators/A&P                          59 year old male who presents to the ED from pain clinic for shortness of breath/hypoxia.  Noted to be 80 to 84% on room air at pain clinic and EMS was called.  Patient was placed on 6 L O2 and provided multiple rounds of DuoNeb, albuterol, magnesium, Solu-Medrol.  She has no complaints.  He states he is supposed to use oxygen "when I need it" however cannot tell me how much oxygen he is typically supposed to be on.  On arrival to the ED patient is afebrile and nontachypneic.  He is currently on 6 L and satting 96%.  He is noted to be mildly tachycardic  in the low 100s, likely related to multiple albuterol doses.  Patient does have left lower extremity edema on exam which he states this was present for about 1 month.  Friend on the phone reports that they have seen his PCP for it and requested fluid pills as they believe he needs to have fluid drained off the leg however she states that PCP will not  give him fluid medication.  She has no obvious tenderness to the calf however given left lower extremity swelling and hypoxia we will plan to obtain DVT study if ultrasound tech still present.  Will obtain labs including CBC, CMP, troponin, D-dimer, BNP.  Swab for Covid.  If all lab work reassuring patient to be discharged home as he has oxygen at home to use.  Unfortunately not able to provide ultrasound for pt's leg tonight. Will have him return in the AM pending labwork today.   CXR with COPD findings.  CBC without leukocytosis. Hgb stable at 16.8 BMP with glucose 165. Chloride 93. Creatinine mildly elevated at 1.69. BUN 35. Will provide fluids pending BNP.   Lab Results  Component Value Date   CREATININE 1.69 (H) 12/16/2019   CREATININE 0.64 07/04/2019   CREATININE 0.59 (L) 07/03/2019   D dimer negative at 0.30 Troponin elevated at 137; will repeat. Pt without any complaints of chest pain. He has a hx of elevated troponin. Troponins 5 months ago > 1,000.  COVID negative BNP 495 which is much improved from baseline  Repeat troponin 143; not elevated > 10 points. I have discussed this with attending physician Dr. Rogene Houston who does not believe pt needs further workup for this given he has no complaints of chest pain or even SOB.   Pt provided with fluids. Nursing staff attempted to wean pt off of O2 however pt quickly desatted to 87%. He does have O2 at home and his friend/roommate states he is supposed to be on it "when he needs it." I feel pt needs it chronically and have discussed this with him. The only reason to keep him in the hospital/admit patient is for O2 requirement which he already has access to at home. Will discharge at this time with strict instructions to wear 4L O2 at all times. Pt to follow up with his PCP for creatinine recheck in 1 week after increasing water intake. He is in agreement with plan and stable for discharge home.   This note was prepared using Dragon voice  recognition software and may include unintentional dictation errors due to the inherent limitations of voice recognition software.  Final Clinical Impression(s) / ED Diagnoses Final diagnoses:  Hypoxia  AKI (acute kidney injury) ([place])  Leg swelling    Rx / DC Orders ED Discharge Orders         Ordered    US Venous Img Lower Unilateral Left        12/16/19 2106           Discharge Instructions     It appears you need to have your OXYGEN on at all times as your oxygen levels are too low without it. I would recommend 4L at all times.   You will need to return for an ultrasound of your left leg tomorrow - please call the number on your discharge paperwork to schedule an appointment. This is to rule out a blood clot in your leg.   Your kidney function was mildly elevated today. This is likely due to dehydration. Please increase you water  intake and have your kidney function rechecked in 1 week.   Return to the ED for any worsening symptoms       Eustaquio Maize, Hershal Coria 12/16/19 2211    Fredia Sorrow, MD 12/17/19 2052

## 2019-12-16 NOTE — ED Notes (Signed)
Lis, the patient's girlfriend has been called to pick him up.

## 2020-02-13 ENCOUNTER — Emergency Department (HOSPITAL_COMMUNITY): Payer: Medicare Other

## 2020-02-13 ENCOUNTER — Encounter (HOSPITAL_COMMUNITY): Payer: Self-pay

## 2020-02-13 ENCOUNTER — Emergency Department (HOSPITAL_COMMUNITY)
Admission: EM | Admit: 2020-02-13 | Discharge: 2020-02-14 | Disposition: A | Discharge status: Home / Self Care | Payer: Medicare Other | Source: Home / Self Care | Attending: Emergency Medicine | Admitting: Emergency Medicine

## 2020-02-13 ENCOUNTER — Other Ambulatory Visit: Payer: Self-pay

## 2020-02-13 DIAGNOSIS — F17210 Nicotine dependence, cigarettes, uncomplicated: Secondary | ICD-10-CM | POA: Insufficient documentation

## 2020-02-13 DIAGNOSIS — Z7901 Long term (current) use of anticoagulants: Secondary | ICD-10-CM | POA: Insufficient documentation

## 2020-02-13 DIAGNOSIS — Z79899 Other long term (current) drug therapy: Secondary | ICD-10-CM | POA: Insufficient documentation

## 2020-02-13 DIAGNOSIS — R4182 Altered mental status, unspecified: Secondary | ICD-10-CM | POA: Insufficient documentation

## 2020-02-13 DIAGNOSIS — F1120 Opioid dependence, uncomplicated: Secondary | ICD-10-CM | POA: Insufficient documentation

## 2020-02-13 DIAGNOSIS — Z7982 Long term (current) use of aspirin: Secondary | ICD-10-CM | POA: Insufficient documentation

## 2020-02-13 DIAGNOSIS — J441 Chronic obstructive pulmonary disease with (acute) exacerbation: Secondary | ICD-10-CM | POA: Insufficient documentation

## 2020-02-13 DIAGNOSIS — Z20822 Contact with and (suspected) exposure to covid-19: Secondary | ICD-10-CM | POA: Insufficient documentation

## 2020-02-13 DIAGNOSIS — F1190 Opioid use, unspecified, uncomplicated: Secondary | ICD-10-CM

## 2020-02-13 DIAGNOSIS — J9621 Acute and chronic respiratory failure with hypoxia: Secondary | ICD-10-CM | POA: Diagnosis not present

## 2020-02-13 DIAGNOSIS — I2510 Atherosclerotic heart disease of native coronary artery without angina pectoris: Secondary | ICD-10-CM | POA: Insufficient documentation

## 2020-02-13 DIAGNOSIS — R400 Somnolence: Secondary | ICD-10-CM

## 2020-02-13 LAB — URINALYSIS, ROUTINE W REFLEX MICROSCOPIC
Glucose, UA: NEGATIVE mg/dL
Hgb urine dipstick: NEGATIVE
Ketones, ur: 5 mg/dL — AB
Leukocytes,Ua: NEGATIVE
Nitrite: NEGATIVE
Protein, ur: 100 mg/dL — AB
Specific Gravity, Urine: 1.028 (ref 1.005–1.030)
pH: 5 (ref 5.0–8.0)

## 2020-02-13 LAB — RAPID URINE DRUG SCREEN, HOSP PERFORMED
Amphetamines: NOT DETECTED
Barbiturates: NOT DETECTED
Benzodiazepines: NOT DETECTED
Cocaine: NOT DETECTED
Opiates: POSITIVE — AB
Tetrahydrocannabinol: POSITIVE — AB

## 2020-02-13 LAB — COMPREHENSIVE METABOLIC PANEL
ALT: 23 U/L (ref 0–44)
AST: 35 U/L (ref 15–41)
Albumin: 3.5 g/dL (ref 3.5–5.0)
Alkaline Phosphatase: 85 U/L (ref 38–126)
Anion gap: 9 (ref 5–15)
BUN: 27 mg/dL — ABNORMAL HIGH (ref 6–20)
CO2: 33 mmol/L — ABNORMAL HIGH (ref 22–32)
Calcium: 10.6 mg/dL — ABNORMAL HIGH (ref 8.9–10.3)
Chloride: 91 mmol/L — ABNORMAL LOW (ref 98–111)
Creatinine, Ser: 1.59 mg/dL — ABNORMAL HIGH (ref 0.61–1.24)
GFR, Estimated: 49 mL/min — ABNORMAL LOW (ref >60)
Glucose, Bld: 118 mg/dL — ABNORMAL HIGH (ref 70–99)
Potassium: 4.8 mmol/L (ref 3.5–5.1)
Sodium: 133 mmol/L — ABNORMAL LOW (ref 135–145)
Total Bilirubin: 0.9 mg/dL (ref 0.3–1.2)
Total Protein: 6.9 g/dL (ref 6.5–8.1)

## 2020-02-13 LAB — CBC WITH DIFFERENTIAL/PLATELET
Abs Immature Granulocytes: 0.04 10*3/uL (ref 0.00–0.07)
Basophils Absolute: 0.1 10*3/uL (ref 0.0–0.1)
Basophils Relative: 1 %
Eosinophils Absolute: 0 10*3/uL (ref 0.0–0.5)
Eosinophils Relative: 0 %
HCT: 55.7 % — ABNORMAL HIGH (ref 39.0–52.0)
Hemoglobin: 16.8 g/dL (ref 13.0–17.0)
Immature Granulocytes: 1 %
Lymphocytes Relative: 7 %
Lymphs Abs: 0.6 10*3/uL — ABNORMAL LOW (ref 0.7–4.0)
MCH: 28.4 pg (ref 26.0–34.0)
MCHC: 30.2 g/dL (ref 30.0–36.0)
MCV: 94.2 fL (ref 80.0–100.0)
Monocytes Absolute: 0.8 10*3/uL (ref 0.1–1.0)
Monocytes Relative: 10 %
Neutro Abs: 7.2 10*3/uL (ref 1.7–7.7)
Neutrophils Relative %: 81 %
Platelets: 270 10*3/uL (ref 150–400)
RBC: 5.91 MIL/uL — ABNORMAL HIGH (ref 4.22–5.81)
RDW: 19.6 % — ABNORMAL HIGH (ref 11.5–15.5)
WBC: 8.8 10*3/uL (ref 4.0–10.5)
nRBC: 0.3 % — ABNORMAL HIGH (ref 0.0–0.2)

## 2020-02-13 LAB — RESPIRATORY PANEL BY RT PCR (FLU A&B, COVID)
Influenza A by PCR: NEGATIVE
Influenza B by PCR: NEGATIVE
SARS Coronavirus 2 by RT PCR: NEGATIVE

## 2020-02-13 LAB — PROTIME-INR
INR: 1.3 — ABNORMAL HIGH (ref 0.8–1.2)
Prothrombin Time: 15.3 seconds — ABNORMAL HIGH (ref 11.4–15.2)

## 2020-02-13 LAB — LACTIC ACID, PLASMA
Lactic Acid, Venous: 2.1 mmol/L (ref 0.5–1.9)
Lactic Acid, Venous: 1.3 mmol/L (ref 0.5–1.9)

## 2020-02-13 MED ORDER — SODIUM CHLORIDE 0.9 % IV BOLUS
500.0000 mL | Freq: Once | INTRAVENOUS | Status: AC
  Administered 2020-02-13: 500 mL via INTRAVENOUS

## 2020-02-13 NOTE — ED Notes (Signed)
POA/Girlfriend Antonio Ramirez: [phone]

## 2020-02-13 NOTE — ED Notes (Addendum)
Bladder scan-186

## 2020-02-13 NOTE — ED Triage Notes (Signed)
EMS reports pt's wife found him unresponsive.  When ems arrived, pt was still unresponsive but responded to ammonia inhalant.  Reports was oriented to self and place.  Says was very drowsy.  Reports pupils equal and constricted, equal grip strength.  BP 87/67, increased to 102/63, cbg 126, hr 90's.  Initial o2 sat was 76% on room air.  EMS put pt on o2 at 4liters and sat increased to 99%.  Reports pt was asleep when sat was in the 70's.  Pt alert and oriented at this time.

## 2020-02-13 NOTE — ED Notes (Signed)
Bladder scan done, pt has less than 200 cc in bladder. PA notified that in and out cath was unsuccessful. Pt currently sleeping. Will wait per PA and try again.

## 2020-02-13 NOTE — ED Provider Notes (Signed)
[facility] EMERGENCY DEPARTMENT Provider Note   CSN: [id number] Arrival date & time: 02/13/20  1635     History Chief Complaint  Patient presents with  . Altered Mental Status    Antonio Ramirez is a 59 y.o. male.  The history is provided by the patient. No language interpreter was used.  Altered Mental Status Presenting symptoms: unresponsiveness   Severity:  Severe Most recent episode:  Today Episode history:  Single Timing:  Constant Progression:  Worsening Chronicity:  New Context: not recent illness   Associated symptoms: no light-headedness    Pt less responsive than usual.  Pt is on oxycodone extended release and regular oxycodone.      Past Medical History:  Diagnosis Date  . CAD (coronary artery disease)   . COPD (chronic obstructive pulmonary disease) ([place])   . Opioid dependence ([place])   . Smoker     Patient Active Problem List   Diagnosis Date Noted  . Pressure injury of skin 06/30/2019  . Sepsis ([place]) 06/29/2019  . Acute respiratory failure with hypoxia ([place]) 06/01/2019  . COPD (chronic obstructive pulmonary disease) ([place])   . COPD with acute exacerbation ([place])   . Current every day smoker   . Opioid dependence ([place])   . Depression   . Hyperlipidemia   . CAD (coronary artery disease)   . Elevated troponin   . AKI (acute kidney injury) ([place])   . Hyperglycemia   . Tobacco abuse   . Demand ischemia of myocardium ([place])   . Hyperlipidemia LDL goal <70     Past Surgical History:  Procedure Laterality Date  . cardiac bypass    . CARDIAC SURGERY    . COLON SURGERY         Family History  Problem Relation Age of Onset  . COPD Mother     Social History   Tobacco Use  . Smoking status: Current Every Day Smoker    Packs/day: 1.00    Types: Cigarettes  . Smokeless tobacco: Never Used  Substance Use Topics  . Alcohol use: No  . Drug use: No    Home Medications Prior to Admission medications   Medication Sig Start Date End Date  Taking? Authorizing Provider  albuterol (VENTOLIN HFA) 108 (90 Base) MCG/ACT inhaler Inhale 1-2 puffs into the lungs every 6 (six) hours as needed for wheezing or shortness of breath. 06/03/19   Murlean Iba, MD  AMITIZA 24 MCG capsule Take 24 mcg by mouth 2 (two) times daily with a meal. 05/17/19   [provider]  aspirin EC 81 MG tablet Take 1 tablet (81 mg total) by mouth daily. 06/03/19   Johnson, Clanford L, MD  atorvastatin (LIPITOR) 40 MG tablet Take 1 tablet (40 mg total) by mouth daily at 6 PM. 06/03/19   Johnson, Clanford L, MD  calcitRIOL (ROCALTROL) 0.5 MCG capsule Take 0.5 mcg by mouth daily. 03/16/19   [provider]  citalopram (CELEXA) 10 MG tablet Take 1 tablet (10 mg total) by mouth daily. 07/04/19   Patrecia Pour, MD  clopidogrel (PLAVIX) 75 MG tablet Take 75 mg by mouth daily. 06/27/19   [provider]  gabapentin (NEURONTIN) 600 MG tablet Take 600 mg by mouth at bedtime.  05/27/19   [provider]  isosorbide mononitrate (IMDUR) 30 MG 24 hr tablet Take 30 mg by mouth daily.  01/22/17   [provider]  omeprazole (PRILOSEC) 20 MG capsule Take 20 mg by mouth daily before breakfast.  30 min prior to breakfast. 04/13/19   [provider]  oxyCODONE (ROXICODONE) 15 MG immediate release tablet Take 1 tablet (15 mg total) by mouth 3 (three) times daily as needed for pain. 07/04/19   Patrecia Pour, MD  polyethylene glycol (MIRALAX / Floria Raveling) packet Take 17 g by mouth daily. Patient taking differently: Take 17 g by mouth daily as needed for mild constipation or moderate constipation.  02/11/17   Long, Wonda Olds, MD  senna-docusate (SENOKOT-S) 8.6-50 MG tablet Take 2 tablets by mouth at bedtime as needed for mild constipation. 06/03/19   Johnson, Eldridge Dace, MD  SYMBICORT 160-4.5 MCG/ACT inhaler Inhale 2 puffs into the lungs 2 (two) times daily. 04/29/19   [provider]  XTAMPZA ER 36 MG C12A Take 1 capsule (36 mg total) by  mouth every 12 (twelve) hours. 07/04/19   Patrecia Pour, MD    Allergies    Opana [oxymorphone hcl]  Review of Systems   Review of Systems  Neurological: Negative for light-headedness.  All other systems reviewed and are negative.   Physical Exam Updated Vital Signs BP 99/76   Pulse 100   Temp 97.7 F (36.5 C) (Oral)   Resp 13   Ht 6' (1.829 m)   Wt 70.3 kg   SpO2 90%   BMI 21.02 kg/m   Physical Exam Vitals and nursing note reviewed.  Constitutional:      Appearance: He is well-developed. He is ill-appearing.  HENT:     Head: Normocephalic.  Eyes:     Conjunctiva/sclera: Conjunctivae normal.  Cardiovascular:     Rate and Rhythm: Normal rate and regular rhythm.     Heart sounds: No murmur heard.   Pulmonary:     Effort: Pulmonary effort is normal. No respiratory distress.     Breath sounds: Normal breath sounds.  Abdominal:     Palpations: Abdomen is soft.     Tenderness: There is no abdominal tenderness.  Musculoskeletal:     Cervical back: Neck supple.  Skin:    General: Skin is warm and dry.  Neurological:     Mental Status: He is alert.     Comments: Pt arouses to sternal rub.  Oxygen sat  95 % on 02.      ED Results / Procedures / Treatments   Labs (all labs ordered are listed, but only abnormal results are displayed) Labs Reviewed  COMPREHENSIVE METABOLIC PANEL - Abnormal; Notable for the following components:      Result Value   Sodium 133 (*)    Chloride 91 (*)    CO2 33 (*)    Glucose, Bld 118 (*)    BUN 27 (*)    Creatinine, Ser 1.59 (*)    Calcium 10.6 (*)    GFR, Estimated 49 (*)    All other components within normal limits  CBC WITH DIFFERENTIAL/PLATELET - Abnormal; Notable for the following components:   RBC 5.91 (*)    HCT 55.7 (*)    RDW 19.6 (*)    nRBC 0.3 (*)    Lymphs Abs 0.6 (*)    All other components within normal limits  PROTIME-INR - Abnormal; Notable for the following components:   Prothrombin Time 15.3 (*)    INR  1.3 (*)    All other components within normal limits  RESPIRATORY PANEL BY RT PCR (FLU A&B, COVID)  CULTURE, BLOOD (ROUTINE X 2)  CULTURE, BLOOD (ROUTINE X 2)  LACTIC ACID, PLASMA  LACTIC ACID, PLASMA  URINALYSIS, ROUTINE W REFLEX MICROSCOPIC    EKG None  Radiology No results found.  Procedures Procedures (including critical care time)  Medications Ordered in ED Medications - No data to display  ED Course  I have reviewed the triage vital signs and the nursing notes.  Pertinent labs & imaging results that were available during my care of the patient were reviewed by me and considered in my medical decision making (see chart for details).    MDM Rules/Calculators/A&P                          MDM:  Pt reevaluated, pt more awake ,  Pt's labs pending,   Pt's care turned over to [facility] at &:00 pm  Final Clinical Impression(s) / ED Diagnoses Final diagnoses:  Altered mental status, unspecified altered mental status type    Rx / DC Orders ED Discharge Orders    None       Sidney Ace 02/13/20 Hilarie Fredrickson, MD 02/14/20 1222

## 2020-02-13 NOTE — ED Provider Notes (Signed)
Care assumed from [facility], PA-C at shift change with labs and re-evaluation pending .  In brief, this patient is a 59 y.o. M brought in for evaluation of altered mental status.  Patient takes narcotic pain medication on a daily basis.  Girlfriend brought him in after patient was less responsive than usual.  He did take his oxycodone today.  Please see previous provider note for full   Physical Exam  BP 112/80   Pulse 82   Temp 97.7 F (36.5 C) (Oral)   Resp 12   Ht 6' (1.829 m)   Wt 70.3 kg   SpO2 94%   BMI 21.02 kg/m   Physical Exam   Able to answer my questions.  No slurred speech. 5/5 BUE and BLE Follows commands, Moves all extremities  CN III-XII intact.  2+ radial pulses, 2+ DP pulses Abdomen is soft, non-distended, non-tender. No rigidity, No guarding. No peritoneal signs.  ED Course/Procedures     Procedures  Results for orders placed or performed during the hospital encounter of 02/13/20 (from the past 24 hour(s))  Respiratory Panel by RT PCR (Flu A&B, Covid) - Nasopharyngeal Swab     Status: None   Collection Time: 02/13/20  4:51 PM   Specimen: Nasopharyngeal Swab  Result Value Ref Range   SARS Coronavirus 2 by RT PCR NEGATIVE NEGATIVE   Influenza A by PCR NEGATIVE NEGATIVE   Influenza B by PCR NEGATIVE NEGATIVE  Comprehensive metabolic panel     Status: Abnormal   Collection Time: 02/13/20  4:52 PM  Result Value Ref Range   Sodium 133 (L) 135 - 145 mmol/L   Potassium 4.8 3.5 - 5.1 mmol/L   Chloride 91 (L) 98 - 111 mmol/L   CO2 33 (H) 22 - 32 mmol/L   Glucose, Bld 118 (H) 70 - 99 mg/dL   BUN 27 (H) 6 - 20 mg/dL   Creatinine, Ser 1.59 (H) 0.61 - 1.24 mg/dL   Calcium 10.6 (H) 8.9 - 10.3 mg/dL   Total Protein 6.9 6.5 - 8.1 g/dL   Albumin 3.5 3.5 - 5.0 g/dL   AST 35 15 - 41 U/L   ALT 23 0 - 44 U/L   Alkaline Phosphatase 85 38 - 126 U/L   Total Bilirubin 0.9 0.3 - 1.2 mg/dL   GFR, Estimated 49 (L) >60 mL/min   Anion gap 9 5 - 15  Lactic acid, plasma      Status: Abnormal   Collection Time: 02/13/20  4:52 PM  Result Value Ref Range   Lactic Acid, Venous 2.1 (HH) 0.5 - 1.9 mmol/L  CBC with Differential     Status: Abnormal   Collection Time: 02/13/20  4:52 PM  Result Value Ref Range   WBC 8.8 4.0 - 10.5 K/uL   RBC 5.91 (H) 4.22 - 5.81 MIL/uL   Hemoglobin 16.8 13.0 - 17.0 g/dL   HCT 55.7 (H) 39 - 52 %   MCV 94.2 80.0 - 100.0 fL   MCH 28.4 26.0 - 34.0 pg   MCHC 30.2 30.0 - 36.0 g/dL   RDW 19.6 (H) 11.5 - 15.5 %   Platelets 270 150 - 400 K/uL   nRBC 0.3 (H) 0.0 - 0.2 %   Neutrophils Relative % 81 %   Neutro Abs 7.2 1.7 - 7.7 K/uL   Lymphocytes Relative 7 %   Lymphs Abs 0.6 (L) 0.7 - 4.0 K/uL   Monocytes Relative 10 %   Monocytes Absolute 0.8 0.1 - 1.0 K/uL  Eosinophils Relative 0 %   Eosinophils Absolute 0.0 0.0 - 0.5 K/uL   Basophils Relative 1 %   Basophils Absolute 0.1 0.0 - 0.1 K/uL   Immature Granulocytes 1 %   Abs Immature Granulocytes 0.04 0.00 - 0.07 K/uL  Protime-INR     Status: Abnormal   Collection Time: 02/13/20  4:52 PM  Result Value Ref Range   Prothrombin Time 15.3 (H) 11.4 - 15.2 seconds   INR 1.3 (H) 0.8 - 1.2  Culture, blood (Routine x 2)     Status: None (Preliminary result)   Collection Time: 02/13/20  4:52 PM   Specimen: Right Antecubital; Blood  Result Value Ref Range   Specimen Description RIGHT ANTECUBITAL    Special Requests      BOTTLES DRAWN AEROBIC AND ANAEROBIC Blood Culture adequate volume Performed at [facility], [address], [place], [place] [address]    Culture PENDING    Report Status PENDING   Urinalysis, Routine w reflex microscopic Urine, Catheterized     Status: Abnormal   Collection Time: 02/13/20  4:52 PM  Result Value Ref Range   Color, Urine AMBER (A) YELLOW   APPearance CLOUDY (A) CLEAR   Specific Gravity, Urine 1.028 1.005 - 1.030   pH 5.0 5.0 - 8.0   Glucose, UA NEGATIVE NEGATIVE mg/dL   Hgb urine dipstick NEGATIVE NEGATIVE   Bilirubin Urine SMALL (A) NEGATIVE    Ketones, ur 5 (A) NEGATIVE mg/dL   Protein, ur 100 (A) NEGATIVE mg/dL   Nitrite NEGATIVE NEGATIVE   Leukocytes,Ua NEGATIVE NEGATIVE   RBC / HPF 6-10 0 - 5 RBC/hpf   WBC, UA 6-10 0 - 5 WBC/hpf   Bacteria, UA RARE (A) NONE SEEN   Squamous Epithelial / LPF 0-5 0 - 5   Mucus PRESENT    Hyaline Casts, UA PRESENT    Ca Oxalate Crys, UA PRESENT   Culture, blood (Routine x 2)     Status: None (Preliminary result)   Collection Time: 02/13/20  5:44 PM   Specimen: Right Antecubital; Blood  Result Value Ref Range   Specimen Description RIGHT ANTECUBITAL    Special Requests      BOTTLES DRAWN AEROBIC AND ANAEROBIC Blood Culture adequate volume Performed at [facility], [address], [place], [place] [address]    Culture PENDING    Report Status PENDING   Rapid urine drug screen (hospital performed)     Status: Abnormal   Collection Time: 02/13/20  6:36 PM  Result Value Ref Range   Opiates POSITIVE (A) NONE DETECTED   Cocaine NONE DETECTED NONE DETECTED   Benzodiazepines NONE DETECTED NONE DETECTED   Amphetamines NONE DETECTED NONE DETECTED   Tetrahydrocannabinol POSITIVE (A) NONE DETECTED   Barbiturates NONE DETECTED NONE DETECTED  Lactic acid, plasma     Status: None   Collection Time: 02/13/20  7:49 PM  Result Value Ref Range   Lactic Acid, Venous 1.3 0.5 - 1.9 mmol/L    MDM    PLAN: Patient with history of chronic pain medication.  He did take his narcotics today.  Patient arousable to sternal rub.  Labs have been ordered.  Previous provider did not give Narcan.  We will plan to observe and see if he returns back to baseline.  MDM: Lactic acid is 2.1. CBC shows no leukocytosis. Hgb stable at 16.8. CMP shows BUN of 27 and Cr of 1.59. LFTs within normal limits. INR is 1.3. Covid is negative.  Will give fluid for mild  AKI as well as lactic acid.  CXR shows no acute process. He has chronic nonspecific prominence of the interstitial lung markings, unchanged.   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  No acute abnormality noted.  Reevaluation.  Patient is arousable to verbal stimuli.  He can answer my questions, tell me his name, where he is at.  He is moving all extremities.  Repeat abdominal exam shows no tenderness.  His vitals are stable.  He does appear more alert and pupils are 5 to 6 mm and reactive bilaterally.  Lactic is improved to 1.3.  I suspect at this time, this was likely due to his chronic narcotic use.  At this time, he shows no signs of infectious etiology.  He is hemodynamically stable and is awake and alert. At this time, patient exhibits no emergent life-threatening condition that require further evaluation in ED. Discussed patient with Dr. Eulis Foster who is agreeable to plan and evaluated the patient. . At this time, patient exhibits no emergent life-threatening condition that require further evaluation in ED. Patient had ample opportunity for questions and discussion. All patient's questions were answered with full understanding. .ladlc  1. Altered mental status, unspecified altered mental status type   2. Somnolence   3. Opioid use      Portions of this note were generated with Lobby[employer]. Dictation errors may occur despite best attempts at proofreading.     Volanda Napoleon, PA-C 02/14/20 0044    Daleen Bo, MD 02/14/20 1222

## 2020-02-13 NOTE — ED Notes (Signed)
CRITICAL VALUE ALERT  Critical Value:  Lactic 2.1  Date & Time Notied:  02/13/2020 1918  Provider Notified: Caryl Ada K PA  Orders Received/Actions taken: No orders at this time.

## 2020-02-14 ENCOUNTER — Inpatient Hospital Stay (HOSPITAL_COMMUNITY): Payer: Medicare Other

## 2020-02-14 ENCOUNTER — Inpatient Hospital Stay (HOSPITAL_COMMUNITY)
Admission: EM | Admit: 2020-02-14 | Discharge: 2020-03-09 | DRG: 207 | Disposition: A | Discharge status: Hospice | Payer: Medicare Other | Attending: Student | Admitting: Student

## 2020-02-14 ENCOUNTER — Emergency Department (HOSPITAL_COMMUNITY): Payer: Medicare Other

## 2020-02-14 DIAGNOSIS — J9611 Chronic respiratory failure with hypoxia: Secondary | ICD-10-CM | POA: Diagnosis not present

## 2020-02-14 DIAGNOSIS — Z978 Presence of other specified devices: Secondary | ICD-10-CM | POA: Diagnosis not present

## 2020-02-14 DIAGNOSIS — Z9152 Personal history of nonsuicidal self-harm: Secondary | ICD-10-CM

## 2020-02-14 DIAGNOSIS — J9621 Acute and chronic respiratory failure with hypoxia: Principal | ICD-10-CM | POA: Diagnosis present

## 2020-02-14 DIAGNOSIS — I2781 Cor pulmonale (chronic): Secondary | ICD-10-CM | POA: Diagnosis present

## 2020-02-14 DIAGNOSIS — G40901 Epilepsy, unspecified, not intractable, with status epilepticus: Secondary | ICD-10-CM | POA: Diagnosis present

## 2020-02-14 DIAGNOSIS — E8770 Fluid overload, unspecified: Secondary | ICD-10-CM | POA: Diagnosis not present

## 2020-02-14 DIAGNOSIS — Z66 Do not resuscitate: Secondary | ICD-10-CM | POA: Diagnosis not present

## 2020-02-14 DIAGNOSIS — F339 Major depressive disorder, recurrent, unspecified: Secondary | ICD-10-CM | POA: Diagnosis not present

## 2020-02-14 DIAGNOSIS — Z0189 Encounter for other specified special examinations: Secondary | ICD-10-CM

## 2020-02-14 DIAGNOSIS — J441 Chronic obstructive pulmonary disease with (acute) exacerbation: Secondary | ICD-10-CM | POA: Diagnosis present

## 2020-02-14 DIAGNOSIS — J439 Emphysema, unspecified: Secondary | ICD-10-CM | POA: Diagnosis present

## 2020-02-14 DIAGNOSIS — I480 Paroxysmal atrial fibrillation: Secondary | ICD-10-CM | POA: Diagnosis present

## 2020-02-14 DIAGNOSIS — I2729 Other secondary pulmonary hypertension: Secondary | ICD-10-CM | POA: Diagnosis present

## 2020-02-14 DIAGNOSIS — I5022 Chronic systolic (congestive) heart failure: Secondary | ICD-10-CM | POA: Diagnosis present

## 2020-02-14 DIAGNOSIS — E876 Hypokalemia: Secondary | ICD-10-CM | POA: Diagnosis not present

## 2020-02-14 DIAGNOSIS — N1832 Chronic kidney disease, stage 3b: Secondary | ICD-10-CM | POA: Diagnosis present

## 2020-02-14 DIAGNOSIS — J9601 Acute respiratory failure with hypoxia: Secondary | ICD-10-CM | POA: Diagnosis not present

## 2020-02-14 DIAGNOSIS — Z9114 Patient's other noncompliance with medication regimen: Secondary | ICD-10-CM

## 2020-02-14 DIAGNOSIS — I4510 Unspecified right bundle-branch block: Secondary | ICD-10-CM | POA: Diagnosis present

## 2020-02-14 DIAGNOSIS — R7881 Bacteremia: Secondary | ICD-10-CM | POA: Diagnosis not present

## 2020-02-14 DIAGNOSIS — Z20822 Contact with and (suspected) exposure to covid-19: Secondary | ICD-10-CM | POA: Diagnosis present

## 2020-02-14 DIAGNOSIS — Z4659 Encounter for fitting and adjustment of other gastrointestinal appliance and device: Secondary | ICD-10-CM

## 2020-02-14 DIAGNOSIS — Z9119 Patient's noncompliance with other medical treatment and regimen: Secondary | ICD-10-CM

## 2020-02-14 DIAGNOSIS — I609 Nontraumatic subarachnoid hemorrhage, unspecified: Secondary | ICD-10-CM | POA: Diagnosis not present

## 2020-02-14 DIAGNOSIS — Z825 Family history of asthma and other chronic lower respiratory diseases: Secondary | ICD-10-CM

## 2020-02-14 DIAGNOSIS — J9691 Respiratory failure, unspecified with hypoxia: Secondary | ICD-10-CM

## 2020-02-14 DIAGNOSIS — J189 Pneumonia, unspecified organism: Secondary | ICD-10-CM | POA: Diagnosis present

## 2020-02-14 DIAGNOSIS — T17908A Unspecified foreign body in respiratory tract, part unspecified causing other injury, initial encounter: Secondary | ICD-10-CM

## 2020-02-14 DIAGNOSIS — E872 Acidosis: Secondary | ICD-10-CM | POA: Diagnosis present

## 2020-02-14 DIAGNOSIS — M549 Dorsalgia, unspecified: Secondary | ICD-10-CM | POA: Diagnosis present

## 2020-02-14 DIAGNOSIS — F17210 Nicotine dependence, cigarettes, uncomplicated: Secondary | ICD-10-CM | POA: Diagnosis present

## 2020-02-14 DIAGNOSIS — F32A Depression, unspecified: Secondary | ICD-10-CM | POA: Diagnosis present

## 2020-02-14 DIAGNOSIS — R1310 Dysphagia, unspecified: Secondary | ICD-10-CM | POA: Diagnosis present

## 2020-02-14 DIAGNOSIS — R0609 Other forms of dyspnea: Secondary | ICD-10-CM | POA: Diagnosis not present

## 2020-02-14 DIAGNOSIS — F1120 Opioid dependence, uncomplicated: Secondary | ICD-10-CM | POA: Diagnosis present

## 2020-02-14 DIAGNOSIS — R531 Weakness: Secondary | ICD-10-CM | POA: Diagnosis not present

## 2020-02-14 DIAGNOSIS — Y95 Nosocomial condition: Secondary | ICD-10-CM | POA: Diagnosis present

## 2020-02-14 DIAGNOSIS — I4892 Unspecified atrial flutter: Secondary | ICD-10-CM | POA: Diagnosis present

## 2020-02-14 DIAGNOSIS — X58XXXA Exposure to other specified factors, initial encounter: Secondary | ICD-10-CM | POA: Diagnosis present

## 2020-02-14 DIAGNOSIS — J9600 Acute respiratory failure, unspecified whether with hypoxia or hypercapnia: Secondary | ICD-10-CM | POA: Insufficient documentation

## 2020-02-14 DIAGNOSIS — M79672 Pain in left foot: Secondary | ICD-10-CM

## 2020-02-14 DIAGNOSIS — Z8673 Personal history of transient ischemic attack (TIA), and cerebral infarction without residual deficits: Secondary | ICD-10-CM

## 2020-02-14 DIAGNOSIS — R0600 Dyspnea, unspecified: Secondary | ICD-10-CM | POA: Diagnosis not present

## 2020-02-14 DIAGNOSIS — F1129 Opioid dependence with unspecified opioid-induced disorder: Secondary | ICD-10-CM | POA: Diagnosis not present

## 2020-02-14 DIAGNOSIS — G9341 Metabolic encephalopathy: Secondary | ICD-10-CM | POA: Diagnosis present

## 2020-02-14 DIAGNOSIS — I2510 Atherosclerotic heart disease of native coronary artery without angina pectoris: Secondary | ICD-10-CM | POA: Diagnosis present

## 2020-02-14 DIAGNOSIS — I9581 Postprocedural hypotension: Secondary | ICD-10-CM | POA: Diagnosis not present

## 2020-02-14 DIAGNOSIS — R069 Unspecified abnormalities of breathing: Secondary | ICD-10-CM

## 2020-02-14 DIAGNOSIS — R000 Tachycardia, unspecified: Secondary | ICD-10-CM | POA: Diagnosis not present

## 2020-02-14 DIAGNOSIS — Z7982 Long term (current) use of aspirin: Secondary | ICD-10-CM

## 2020-02-14 DIAGNOSIS — N179 Acute kidney failure, unspecified: Secondary | ICD-10-CM | POA: Diagnosis present

## 2020-02-14 DIAGNOSIS — R4182 Altered mental status, unspecified: Secondary | ICD-10-CM | POA: Diagnosis not present

## 2020-02-14 DIAGNOSIS — I471 Supraventricular tachycardia: Secondary | ICD-10-CM | POA: Diagnosis present

## 2020-02-14 DIAGNOSIS — R569 Unspecified convulsions: Secondary | ICD-10-CM | POA: Diagnosis not present

## 2020-02-14 DIAGNOSIS — J9622 Acute and chronic respiratory failure with hypercapnia: Secondary | ICD-10-CM | POA: Diagnosis present

## 2020-02-14 DIAGNOSIS — I639 Cerebral infarction, unspecified: Secondary | ICD-10-CM | POA: Diagnosis not present

## 2020-02-14 DIAGNOSIS — I130 Hypertensive heart and chronic kidney disease with heart failure and stage 1 through stage 4 chronic kidney disease, or unspecified chronic kidney disease: Secondary | ICD-10-CM | POA: Diagnosis present

## 2020-02-14 DIAGNOSIS — Z515 Encounter for palliative care: Secondary | ICD-10-CM | POA: Diagnosis not present

## 2020-02-14 DIAGNOSIS — R008 Other abnormalities of heart beat: Secondary | ICD-10-CM | POA: Diagnosis not present

## 2020-02-14 DIAGNOSIS — I484 Atypical atrial flutter: Secondary | ICD-10-CM | POA: Diagnosis not present

## 2020-02-14 DIAGNOSIS — J449 Chronic obstructive pulmonary disease, unspecified: Secondary | ICD-10-CM | POA: Diagnosis present

## 2020-02-14 DIAGNOSIS — Z79899 Other long term (current) drug therapy: Secondary | ICD-10-CM

## 2020-02-14 DIAGNOSIS — I255 Ischemic cardiomyopathy: Secondary | ICD-10-CM | POA: Diagnosis present

## 2020-02-14 DIAGNOSIS — I25810 Atherosclerosis of coronary artery bypass graft(s) without angina pectoris: Secondary | ICD-10-CM | POA: Diagnosis not present

## 2020-02-14 DIAGNOSIS — I429 Cardiomyopathy, unspecified: Secondary | ICD-10-CM | POA: Diagnosis not present

## 2020-02-14 DIAGNOSIS — J9602 Acute respiratory failure with hypercapnia: Secondary | ICD-10-CM | POA: Diagnosis not present

## 2020-02-14 DIAGNOSIS — M7989 Other specified soft tissue disorders: Secondary | ICD-10-CM

## 2020-02-14 DIAGNOSIS — Z7951 Long term (current) use of inhaled steroids: Secondary | ICD-10-CM

## 2020-02-14 DIAGNOSIS — I739 Peripheral vascular disease, unspecified: Secondary | ICD-10-CM | POA: Diagnosis present

## 2020-02-14 DIAGNOSIS — E785 Hyperlipidemia, unspecified: Secondary | ICD-10-CM | POA: Diagnosis present

## 2020-02-14 DIAGNOSIS — L89156 Pressure-induced deep tissue damage of sacral region: Secondary | ICD-10-CM | POA: Diagnosis present

## 2020-02-14 DIAGNOSIS — R7989 Other specified abnormal findings of blood chemistry: Secondary | ICD-10-CM | POA: Diagnosis present

## 2020-02-14 DIAGNOSIS — Z7902 Long term (current) use of antithrombotics/antiplatelets: Secondary | ICD-10-CM

## 2020-02-14 DIAGNOSIS — E870 Hyperosmolality and hypernatremia: Secondary | ICD-10-CM | POA: Diagnosis present

## 2020-02-14 DIAGNOSIS — A419 Sepsis, unspecified organism: Secondary | ICD-10-CM | POA: Diagnosis not present

## 2020-02-14 DIAGNOSIS — Z885 Allergy status to narcotic agent status: Secondary | ICD-10-CM

## 2020-02-14 DIAGNOSIS — Z951 Presence of aortocoronary bypass graft: Secondary | ICD-10-CM

## 2020-02-14 DIAGNOSIS — I6930 Unspecified sequelae of cerebral infarction: Secondary | ICD-10-CM | POA: Diagnosis not present

## 2020-02-14 DIAGNOSIS — G8191 Hemiplegia, unspecified affecting right dominant side: Secondary | ICD-10-CM | POA: Diagnosis not present

## 2020-02-14 DIAGNOSIS — G9340 Encephalopathy, unspecified: Secondary | ICD-10-CM | POA: Diagnosis not present

## 2020-02-14 DIAGNOSIS — I50812 Chronic right heart failure: Secondary | ICD-10-CM | POA: Diagnosis not present

## 2020-02-14 DIAGNOSIS — R471 Dysarthria and anarthria: Secondary | ICD-10-CM | POA: Diagnosis present

## 2020-02-14 DIAGNOSIS — R451 Restlessness and agitation: Secondary | ICD-10-CM | POA: Diagnosis not present

## 2020-02-14 DIAGNOSIS — G8929 Other chronic pain: Secondary | ICD-10-CM | POA: Diagnosis present

## 2020-02-14 LAB — RAPID URINE DRUG SCREEN, HOSP PERFORMED
Amphetamines: NOT DETECTED
Barbiturates: NOT DETECTED
Benzodiazepines: NOT DETECTED
Cocaine: NOT DETECTED
Opiates: POSITIVE — AB
Tetrahydrocannabinol: POSITIVE — AB

## 2020-02-14 LAB — CBC WITH DIFFERENTIAL/PLATELET
Abs Immature Granulocytes: 0.09 10*3/uL — ABNORMAL HIGH (ref 0.00–0.07)
Basophils Absolute: 0 10*3/uL (ref 0.0–0.1)
Basophils Relative: 0 %
Eosinophils Absolute: 0 10*3/uL (ref 0.0–0.5)
Eosinophils Relative: 0 %
HCT: 58.5 % — ABNORMAL HIGH (ref 39.0–52.0)
Hemoglobin: 17.1 g/dL — ABNORMAL HIGH (ref 13.0–17.0)
Immature Granulocytes: 1 %
Lymphocytes Relative: 4 %
Lymphs Abs: 0.8 10*3/uL (ref 0.7–4.0)
MCH: 28.2 pg (ref 26.0–34.0)
MCHC: 29.2 g/dL — ABNORMAL LOW (ref 30.0–36.0)
MCV: 96.4 fL (ref 80.0–100.0)
Monocytes Absolute: 1.8 10*3/uL — ABNORMAL HIGH (ref 0.1–1.0)
Monocytes Relative: 9 %
Neutro Abs: 16.8 10*3/uL — ABNORMAL HIGH (ref 1.7–7.7)
Neutrophils Relative %: 86 %
Platelets: 352 10*3/uL (ref 150–400)
RBC: 6.07 MIL/uL — ABNORMAL HIGH (ref 4.22–5.81)
RDW: 19.1 % — ABNORMAL HIGH (ref 11.5–15.5)
WBC: 19.6 10*3/uL — ABNORMAL HIGH (ref 4.0–10.5)
nRBC: 0.3 % — ABNORMAL HIGH (ref 0.0–0.2)

## 2020-02-14 LAB — BLOOD GAS, ARTERIAL
Acid-Base Excess: 6.4 mmol/L — ABNORMAL HIGH (ref 0.0–2.0)
Bicarbonate: 23.8 mmol/L (ref 20.0–28.0)
FIO2: 100
O2 Saturation: 96.7 %
Patient temperature: 37
pCO2 arterial: 118 mmHg (ref 32.0–48.0)
pH, Arterial: 7.106 — CL (ref 7.350–7.450)
pO2, Arterial: 127 mmHg — ABNORMAL HIGH (ref 83.0–108.0)

## 2020-02-14 LAB — URINALYSIS, ROUTINE W REFLEX MICROSCOPIC
Bilirubin Urine: NEGATIVE
Glucose, UA: 50 mg/dL — AB
Ketones, ur: NEGATIVE mg/dL
Leukocytes,Ua: NEGATIVE
Nitrite: NEGATIVE
Protein, ur: 100 mg/dL — AB
RBC / HPF: >50 RBC/hpf — ABNORMAL HIGH (ref 0–5)
Specific Gravity, Urine: 1.021 (ref 1.005–1.030)
pH: 5 (ref 5.0–8.0)

## 2020-02-14 LAB — COMPREHENSIVE METABOLIC PANEL
ALT: 292 U/L — ABNORMAL HIGH (ref 0–44)
AST: 294 U/L — ABNORMAL HIGH (ref 15–41)
Albumin: 3.5 g/dL (ref 3.5–5.0)
Alkaline Phosphatase: 96 U/L (ref 38–126)
Anion gap: 12 (ref 5–15)
BUN: 40 mg/dL — ABNORMAL HIGH (ref 6–20)
CO2: 32 mmol/L (ref 22–32)
Calcium: 10.6 mg/dL — ABNORMAL HIGH (ref 8.9–10.3)
Chloride: 91 mmol/L — ABNORMAL LOW (ref 98–111)
Creatinine, Ser: 1.85 mg/dL — ABNORMAL HIGH (ref 0.61–1.24)
GFR, Estimated: 41 mL/min — ABNORMAL LOW (ref >60)
Glucose, Bld: 145 mg/dL — ABNORMAL HIGH (ref 70–99)
Potassium: 4.2 mmol/L (ref 3.5–5.1)
Sodium: 135 mmol/L (ref 135–145)
Total Bilirubin: 1.2 mg/dL (ref 0.3–1.2)
Total Protein: 7 g/dL (ref 6.5–8.1)

## 2020-02-14 LAB — BLOOD GAS, VENOUS
Acid-Base Excess: 9.7 mmol/L — ABNORMAL HIGH (ref 0.0–2.0)
Bicarbonate: 27 mmol/L (ref 20.0–28.0)
FIO2: 80
O2 Saturation: 22.5 %
Patient temperature: 37
pCO2, Ven: 81.3 mmHg (ref 44.0–60.0)
pH, Ven: 7.275 (ref 7.250–7.430)

## 2020-02-14 LAB — AMMONIA: Ammonia: 66 umol/L — ABNORMAL HIGH (ref 9–35)

## 2020-02-14 LAB — CBG MONITORING, ED: Glucose-Capillary: 163 mg/dL — ABNORMAL HIGH (ref 70–99)

## 2020-02-14 LAB — LACTIC ACID, PLASMA: Lactic Acid, Venous: 4.6 mmol/L (ref 0.5–1.9)

## 2020-02-14 LAB — TRIGLYCERIDES: Triglycerides: 82 mg/dL (ref <150)

## 2020-02-14 MED ORDER — SODIUM CHLORIDE 0.9 % IV BOLUS
1000.0000 mL | Freq: Once | INTRAVENOUS | Status: AC
  Administered 2020-02-14: 1000 mL via INTRAVENOUS

## 2020-02-14 MED ORDER — METRONIDAZOLE IN NACL 5-0.79 MG/ML-% IV SOLN
500.0000 mg | Freq: Three times a day (TID) | INTRAVENOUS | Status: DC
  Administered 2020-02-14 – 2020-02-15 (×3): 500 mg via INTRAVENOUS
  Filled 2020-02-14 (×3): qty 100

## 2020-02-14 MED ORDER — VANCOMYCIN HCL IN DEXTROSE 1-5 GM/200ML-% IV SOLN: 1000.0000 mg | INTRAVENOUS | Status: DC

## 2020-02-14 MED ORDER — SODIUM CHLORIDE 0.9 % IV BOLUS
500.0000 mL | Freq: Once | INTRAVENOUS | Status: AC
  Administered 2020-02-14: 500 mL via INTRAVENOUS

## 2020-02-14 MED ORDER — FAMOTIDINE IN NACL 20-0.9 MG/50ML-% IV SOLN
20.0000 mg | Freq: Two times a day (BID) | INTRAVENOUS | Status: DC
  Administered 2020-02-15 – 2020-02-27 (×27): 20 mg via INTRAVENOUS
  Filled 2020-02-14 (×28): qty 50

## 2020-02-14 MED ORDER — SUCCINYLCHOLINE CHLORIDE 20 MG/ML IJ SOLN
100.0000 mg | Freq: Once | INTRAMUSCULAR | Status: AC
  Administered 2020-02-14: 100 mg via INTRAVENOUS

## 2020-02-14 MED ORDER — DEXTROSE-NACL 5-0.9 % IV SOLN: INTRAVENOUS | Status: AC

## 2020-02-14 MED ORDER — PROPOFOL 1000 MG/100ML IV EMUL
0.0000 ug/kg/min | INTRAVENOUS | Status: DC
  Administered 2020-02-14: 5 ug/kg/min via INTRAVENOUS
  Administered 2020-02-15: 30 ug/kg/min via INTRAVENOUS
  Administered 2020-02-15: 35 ug/kg/min via INTRAVENOUS
  Administered 2020-02-15 – 2020-02-16 (×2): 30 ug/kg/min via INTRAVENOUS
  Administered 2020-02-16: 25 ug/kg/min via INTRAVENOUS
  Administered 2020-02-16: 35 ug/kg/min via INTRAVENOUS
  Administered 2020-02-16: 30 ug/kg/min via INTRAVENOUS
  Administered 2020-02-17: 30.109 ug/kg/min via INTRAVENOUS
  Filled 2020-02-14 (×10): qty 100

## 2020-02-14 MED ORDER — ONDANSETRON HCL 4 MG/2ML IJ SOLN
INTRAMUSCULAR | Status: AC
  Administered 2020-02-14: 4 mg
  Filled 2020-02-14: qty 2

## 2020-02-14 MED ORDER — ONDANSETRON HCL 4 MG/2ML IJ SOLN
4.0000 mg | Freq: Four times a day (QID) | INTRAMUSCULAR | Status: DC | PRN
  Filled 2020-02-14: qty 2

## 2020-02-14 MED ORDER — NALOXONE HCL 2 MG/2ML IJ SOSY
2.0000 mg | PREFILLED_SYRINGE | Freq: Once | INTRAMUSCULAR | Status: AC
  Filled 2020-02-14: qty 2

## 2020-02-14 MED ORDER — ACETAMINOPHEN 650 MG RE SUPP
650.0000 mg | Freq: Four times a day (QID) | RECTAL | Status: DC | PRN
  Administered 2020-02-22 – 2020-02-23 (×2): 650 mg via RECTAL
  Filled 2020-02-14 (×2): qty 1

## 2020-02-14 MED ORDER — ACETAMINOPHEN 325 MG PO TABS
650.0000 mg | ORAL_TABLET | Freq: Four times a day (QID) | ORAL | Status: DC | PRN
  Administered 2020-03-02 – 2020-03-05 (×4): 650 mg via ORAL
  Filled 2020-02-14 (×4): qty 2

## 2020-02-14 MED ORDER — SODIUM CHLORIDE 0.9 % IV BOLUS
500.0000 mL | Freq: Once | INTRAVENOUS | Status: AC
  Administered 2020-02-15: 500 mL via INTRAVENOUS

## 2020-02-14 MED ORDER — ETOMIDATE 2 MG/ML IV SOLN
20.0000 mg | Freq: Once | INTRAVENOUS | Status: AC
  Administered 2020-02-14: 20 mg via INTRAVENOUS

## 2020-02-14 MED ORDER — ENOXAPARIN SODIUM 80 MG/0.8ML ~~LOC~~ SOLN
70.0000 mg | Freq: Once | SUBCUTANEOUS | Status: AC
  Administered 2020-02-14: 70 mg via SUBCUTANEOUS
  Filled 2020-02-14: qty 0.8

## 2020-02-14 MED ORDER — METHYLPREDNISOLONE SODIUM SUCC 125 MG IJ SOLR
60.0000 mg | Freq: Two times a day (BID) | INTRAMUSCULAR | Status: DC
  Administered 2020-02-14 – 2020-02-15 (×2): 60 mg via INTRAVENOUS
  Filled 2020-02-14 (×2): qty 2

## 2020-02-14 MED ORDER — FENTANYL CITRATE (PF) 100 MCG/2ML IJ SOLN
50.0000 ug | INTRAMUSCULAR | Status: DC | PRN
  Administered 2020-02-15: 200 ug via INTRAVENOUS
  Administered 2020-02-16: 100 ug via INTRAVENOUS
  Administered 2020-02-16: 200 ug via INTRAVENOUS
  Filled 2020-02-14 (×2): qty 4
  Filled 2020-02-14: qty 2

## 2020-02-14 MED ORDER — ONDANSETRON HCL 4 MG PO TABS: 4.0000 mg | ORAL_TABLET | Freq: Four times a day (QID) | ORAL | Status: DC | PRN

## 2020-02-14 MED ORDER — SODIUM CHLORIDE 0.9 % IV SOLN: INTRAVENOUS | Status: DC | PRN

## 2020-02-14 MED ORDER — VANCOMYCIN HCL 1250 MG/250ML IV SOLN
1250.0000 mg | Freq: Once | INTRAVENOUS | Status: AC
  Administered 2020-02-14: 1250 mg via INTRAVENOUS
  Filled 2020-02-14: qty 250

## 2020-02-14 MED ORDER — IPRATROPIUM-ALBUTEROL 0.5-2.5 (3) MG/3ML IN SOLN
3.0000 mL | Freq: Three times a day (TID) | RESPIRATORY_TRACT | Status: AC
  Administered 2020-02-15 (×3): 3 mL via RESPIRATORY_TRACT
  Filled 2020-02-14 (×3): qty 3

## 2020-02-14 MED ORDER — NALOXONE HCL 4 MG/10ML IJ SOLN
1.0000 mg/h | INTRAVENOUS | Status: DC
  Administered 2020-02-14: 1 mg/h via INTRAVENOUS
  Filled 2020-02-14: qty 4

## 2020-02-14 MED ORDER — IPRATROPIUM-ALBUTEROL 0.5-2.5 (3) MG/3ML IN SOLN: 3.0000 mL | RESPIRATORY_TRACT | Status: DC | PRN

## 2020-02-14 MED ORDER — FENTANYL CITRATE (PF) 100 MCG/2ML IJ SOLN: 50.0000 ug | INTRAMUSCULAR | Status: DC | PRN

## 2020-02-14 MED ORDER — NOREPINEPHRINE 4 MG/250ML-% IV SOLN
INTRAVENOUS | Status: AC
  Filled 2020-02-14: qty 250

## 2020-02-14 MED ORDER — ACETAMINOPHEN 650 MG RE SUPP
650.0000 mg | Freq: Once | RECTAL | Status: AC
  Administered 2020-02-14: 650 mg via RECTAL
  Filled 2020-02-14: qty 1

## 2020-02-14 MED ORDER — NALOXONE HCL 2 MG/2ML IJ SOSY
PREFILLED_SYRINGE | INTRAMUSCULAR | Status: AC
  Administered 2020-02-14: 2 mg
  Filled 2020-02-14: qty 2

## 2020-02-14 MED ORDER — SODIUM CHLORIDE 0.9 % IV SOLN
2.0000 g | Freq: Two times a day (BID) | INTRAVENOUS | Status: DC
  Administered 2020-02-14: 2 g via INTRAVENOUS
  Filled 2020-02-14 (×2): qty 2

## 2020-02-14 MED ORDER — DOCUSATE SODIUM 50 MG/5ML PO LIQD
100.0000 mg | Freq: Two times a day (BID) | ORAL | Status: DC
  Administered 2020-02-15 – 2020-02-21 (×8): 100 mg via ORAL
  Filled 2020-02-14 (×10): qty 10

## 2020-02-14 NOTE — ED Provider Notes (Signed)
  Face-to-face evaluation   History: He presents for evaluation of altered mental status, with hypoxia and hypotension on arrival.  During.  Observation patient has become more alert, not requiring any support.  He cannot recall why he had to come here today.  Evaluated by me at 00 15 hours, stable for discharge at this time.  Physical exam: Alert, calm, cooperative.  No dysarthria or aphasia.  Oral mucous membranes are moist.  Pupils are equal and reactive, 4 mm bilaterally.  Normal strength arms legs bilaterally.  Medical screening examination/treatment/procedure(s) were conducted as a shared visit with non-physician practitioner(s) and myself.  I personally evaluated the patient during the encounter    Daleen Bo, MD 02/14/20 1221

## 2020-02-14 NOTE — ED Provider Notes (Signed)
[facility] EMERGENCY DEPARTMENT Provider Note   CSN: [id number] Arrival date & time: 02/14/20  1631     History Chief Complaint  Patient presents with  . Altered Mental Status    Antonio Ramirez is a 59 y.o. male.  Pt presents to the ED today with AMS.  Pt has a hx of chronic pain and EMS and there was a question of abuse.  He was 70% on RA upon EMS arrival.  They put him on 6L and O2 sat up to the low 90s.  Pt will tell me his name, but otherwise gives no hx.  Pt was here yesterday for the same, but he was not too bad and returned to baseline.          Past Medical History:  Diagnosis Date  . CAD (coronary artery disease)   . COPD (chronic obstructive pulmonary disease) ([place])   . Opioid dependence ([place])   . Smoker     Patient Active Problem List   Diagnosis Date Noted  . Pressure injury of skin 06/30/2019  . Sepsis ([place]) 06/29/2019  . Acute respiratory failure with hypoxia ([place]) 06/01/2019  . COPD (chronic obstructive pulmonary disease) ([place])   . COPD with acute exacerbation ([place])   . Current every day smoker   . Opioid dependence ([place])   . Depression   . Hyperlipidemia   . CAD (coronary artery disease)   . Elevated troponin   . AKI (acute kidney injury) ([place])   . Hyperglycemia   . Tobacco abuse   . Demand ischemia of myocardium ([place])   . Hyperlipidemia LDL goal <70     Past Surgical History:  Procedure Laterality Date  . cardiac bypass    . CARDIAC SURGERY    . COLON SURGERY         Family History  Problem Relation Age of Onset  . COPD Mother     Social History   Tobacco Use  . Smoking status: Current Every Day Smoker    Packs/day: 1.00    Types: Cigarettes  . Smokeless tobacco: Never Used  Substance Use Topics  . Alcohol use: No  . Drug use: No    Home Medications Prior to Admission medications   Medication Sig Start Date End Date Taking? Authorizing Provider  albuterol (VENTOLIN HFA) 108 (90 Base) MCG/ACT inhaler Inhale 1-2 puffs  into the lungs every 6 (six) hours as needed for wheezing or shortness of breath. 06/03/19   Murlean Iba, MD  AMITIZA 24 MCG capsule Take 24 mcg by mouth 2 (two) times daily with a meal. 05/17/19   [provider]  aspirin EC 81 MG tablet Take 1 tablet (81 mg total) by mouth daily. 06/03/19   Murlean Iba, MD  aspirin-acetaminophen-caffeine (EXCEDRIN MIGRAINE) [phone] MG tablet Take 2 tablets by mouth every 6 (six) hours as needed for headache.    [provider]  atorvastatin (LIPITOR) 40 MG tablet Take 1 tablet (40 mg total) by mouth daily at 6 PM. 06/03/19   Johnson, Clanford L, MD  calcitRIOL (ROCALTROL) 0.5 MCG capsule Take 0.5 mcg by mouth daily. 03/16/19   [provider]  citalopram (CELEXA) 10 MG tablet Take 1 tablet (10 mg total) by mouth daily. 07/04/19   Patrecia Pour, MD  clopidogrel (PLAVIX) 75 MG tablet Take 75 mg by mouth daily. 06/27/19   [provider]  gabapentin (NEURONTIN) 600 MG tablet Take 600 mg by mouth at bedtime.  05/27/19   [provider]  isosorbide mononitrate (IMDUR) 30 MG 24 hr tablet Take 30 mg by mouth daily.  01/22/17   [provider]  omeprazole (PRILOSEC) 20 MG capsule Take 20 mg by mouth daily before breakfast. 30 min prior to breakfast. 04/13/19   [provider]  oxyCODONE (ROXICODONE) 15 MG immediate release tablet Take 1 tablet (15 mg total) by mouth 3 (three) times daily as needed for pain. 07/04/19   Patrecia Pour, MD  polyethylene glycol (MIRALAX / Floria Raveling) packet Take 17 g by mouth daily. Patient not taking: Reported on 02/13/2020 02/11/17   Long, Wonda Olds, MD  senna-docusate (SENOKOT-S) 8.6-50 MG tablet Take 2 tablets by mouth at bedtime as needed for mild constipation. 06/03/19   Johnson, Eldridge Dace, MD  SYMBICORT 160-4.5 MCG/ACT inhaler Inhale 2 puffs into the lungs 2 (two) times daily. 04/29/19   [provider]  XTAMPZA ER 36 MG C12A Take 1 capsule (36 mg total) by mouth  every 12 (twelve) hours. 07/04/19   Patrecia Pour, MD    Allergies    Opana [oxymorphone hcl]  Review of Systems   Review of Systems  Unable to perform ROS: Mental status change    Physical Exam Updated Vital Signs BP 106/75   Pulse 84   Temp 97.8 F (36.6 C) (Axillary)   Resp 20   Ht 6\' 1"  (1.854 m)   SpO2 100%   BMI 20.45 kg/m   Physical Exam Vitals and nursing note reviewed.  Constitutional:      General: He is in acute distress.     Appearance: He is ill-appearing.  HENT:     Head: Normocephalic and atraumatic.     Right Ear: External ear normal.     Left Ear: External ear normal.     Nose: Nose normal.     Mouth/Throat:     Mouth: Mucous membranes are dry.  Eyes:     Extraocular Movements: Extraocular movements intact.     Conjunctiva/sclera: Conjunctivae normal.     Pupils: Pupils are equal, round, and reactive to light.  Cardiovascular:     Rate and Rhythm: Regular rhythm. Tachycardia present.     Pulses: Normal pulses.     Heart sounds: Normal heart sounds.  Pulmonary:     Effort: Bradypnea present.  Abdominal:     General: Abdomen is flat. Bowel sounds are normal.     Palpations: Abdomen is soft.  Musculoskeletal:        General: Normal range of motion.     Cervical back: Normal range of motion and neck supple.  Skin:    General: Skin is warm.     Capillary Refill: Capillary refill takes less than 2 seconds.  Neurological:     Mental Status: He is lethargic and confused.     Comments: Pt incontinent of urine and stool upon arrival     ED Results / Procedures / Treatments   Labs (all labs ordered are listed, but only abnormal results are displayed) Labs Reviewed  CBC WITH DIFFERENTIAL/PLATELET - Abnormal; Notable for the following components:      Result Value   WBC 19.6 (*)    RBC 6.07 (*)    Hemoglobin 17.1 (*)    HCT 58.5 (*)    MCHC 29.2 (*)    RDW 19.1 (*)    nRBC 0.3 (*)    Neutro Abs 16.8 (*)    Monocytes Absolute 1.8 (*)    Abs  Immature Granulocytes 0.09 (*)  All other components within normal limits  COMPREHENSIVE METABOLIC PANEL - Abnormal; Notable for the following components:   Chloride 91 (*)    Glucose, Bld 145 (*)    BUN 40 (*)    Creatinine, Ser 1.85 (*)    Calcium 10.6 (*)    AST 294 (*)    ALT 292 (*)    GFR, Estimated 41 (*)    All other components within normal limits  AMMONIA - Abnormal; Notable for the following components:   Ammonia 66 (*)    All other components within normal limits  BLOOD GAS, ARTERIAL - Abnormal; Notable for the following components:   pH, Arterial 7.106 (*)    pCO2 arterial 118 (*)    pO2, Arterial 127 (*)    Acid-Base Excess 6.4 (*)    All other components within normal limits  CBG MONITORING, ED - Abnormal; Notable for the following components:   Glucose-Capillary 163 (*)    All other components within normal limits  URINALYSIS, ROUTINE W REFLEX MICROSCOPIC  TRIGLYCERIDES  RAPID URINE DRUG SCREEN, HOSP PERFORMED  BLOOD GAS, ARTERIAL    EKG None  Radiology CT Head Wo Contrast  Result Date: 02/13/2020 CLINICAL DATA:  Unresponsive EXAM: CT HEAD WITHOUT CONTRAST TECHNIQUE: Contiguous axial images were obtained from the base of the skull through the vertex without intravenous contrast. COMPARISON:  06/29/2019 FINDINGS: Brain: There again noted changes consistent with prior encephalomalacia from ischemic event in the distribution of the right middle cerebral artery involving the right temporal and parietal lobes. The overall appearance is stable. No findings to suggest acute hemorrhage, acute infarction or space-occupying mass lesion are noted. Mild atrophic changes are again seen. Vascular: No hyperdense vessel or unexpected calcification. Skull: Normal. Negative for fracture or focal lesion. Sinuses/Orbits: No acute finding. Other: None. 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 No acute abnormality noted.  Electronically Signed   By: Inez Catalina M.D.   On: 02/13/2020 21:30   DG Chest Portable 1 View  Result Date: 02/14/2020 CLINICAL DATA:  Intubated, loss of consciousness, COPD EXAM: PORTABLE CHEST 1 VIEW COMPARISON:  02/14/2020 FINDINGS: 2 frontal views of the chest demonstrate endotracheal tube overlying tracheal air column, tip approximately 3.8 cm above carina. Enteric catheter tip and side port project over the distal thoracic esophagus. Cardiac silhouette is unremarkable. Background emphysema again noted without airspace disease, effusion, or pneumothorax. No acute bony abnormalities. IMPRESSION: 1. Enteric catheter tip and side port projecting over the distal thoracic esophagus. Recommend advancing at least 7 cm to ensure side port placement within the gastric lumen. 2. No complication after intubation. 3. Emphysema. Electronically Signed   By: Randa Ngo M.D.   On: 02/14/2020 18:40   DG Chest Portable 1 View  Result Date: 02/14/2020 CLINICAL DATA:  Shortness of breath, history of COPD EXAM: PORTABLE CHEST 1 VIEW COMPARISON:  02/13/2020 FINDINGS: Emphysema and chronic interstitial changes. No new consolidation or edema. No pleural effusion or pneumothorax. Stable cardiomediastinal contours. IMPRESSION: COPD without acute process in the chest. Electronically Signed   By: Macy Mis M.D.   On: 02/14/2020 17:32   DG Chest Portable 1 View  Result Date: 02/13/2020 CLINICAL DATA:  Hypotension. EXAM: PORTABLE CHEST 1 VIEW COMPARISON:  Prior chest radiographs 12/16/2019 and earlier. FINDINGS: Heart size within normal limits. Prior median sternotomy/CABG. Aortic atherosclerosis. Unchanged nonspecific chronic prominence of the interstitial lung markings. No appreciable airspace consolidation or frank pulmonary edema. No evidence of pleural effusion  or pneumothorax. No acute bony abnormality identified. ACDF hardware within the lower cervical spine. IMPRESSION: No evidence of acute cardiopulmonary  abnormality. Chronic nonspecific prominence of the interstitial lung markings, unchanged. Prior median sternotomy/CABG. Aortic Atherosclerosis (ICD10-I70.0). Electronically Signed   By: Kellie Simmering DO   On: 02/13/2020 18:15    Procedures Procedure Name: Intubation Date/Time: 02/14/2020 7:11 PM Performed by: Isla Pence, MD Pre-anesthesia Checklist: Patient identified Oxygen Delivery Method: Non-rebreather mask Preoxygenation: Pre-oxygenation with 100% oxygen Induction Type: Rapid sequence Laryngoscope Size: Glidescope and 3 Tube size: 7.5 mm Number of attempts: 1 Placement Confirmation: ETT inserted through vocal cords under direct vision,  Positive ETCO2 and Breath sounds checked- equal and bilateral Dental Injury: Teeth and Oropharynx as per pre-operative assessment  Difficulty Due To: Difficulty was anticipated, Difficult Airway- due to reduced neck mobility and Difficult Airway- due to dentition Future Recommendations: Recommend- induction with short-acting agent, and alternative techniques readily available      (including critical care time)  Medications Ordered in ED Medications  naloxone HCl (NARCAN) 4 mg in dextrose 5 % 250 mL infusion (1 mg/hr Intravenous New Bag/Given 02/14/20 1738)  propofol (DIPRIVAN) 1000 MG/100ML infusion (10 mcg/kg/min  70.3 kg Intravenous Rate/Dose Change 02/14/20 1846)  norepinephrine (LEVOPHED) 4-5 MG/250ML-% infusion SOLN (has no administration in time range)  ondansetron (ZOFRAN) 4 MG/2ML injection (4 mg  Given 02/14/20 1711)  naloxone (NARCAN) injection 2 mg (2 mg Intravenous Given 02/14/20 1647)  succinylcholine (ANECTINE) injection 100 mg (100 mg Intravenous Given 02/14/20 1812)  etomidate (AMIDATE) injection 20 mg (20 mg Intravenous Given 02/14/20 1811)    ED Course  I have reviewed the triage vital signs and the nursing notes.  Pertinent labs & imaging results that were available during my care of the patient were reviewed by me and  considered in my medical decision making (see chart for details).    MDM Rules/Calculators/A&P                          Pt given 2 mg of narcan and vomited.  Mental status improved briefly and he vomited, but then his mental status deteriorated again.  He was given additional narcan and then put on a drip, but is not improving.  ABG done which showed a ph of 7.1 and a pco2 of 118.  Pt then intubated.  Covid yesterday negative.  Pt's significant other updated.    Pt d/w Dr. Denton Brick (triad) for admission.  CRITICAL CARE Performed by: Isla Pence   Total critical care time: 60 minutes  Critical care time was exclusive of separately billable procedures and treating other patients.  Critical care was necessary to treat or prevent imminent or life-threatening deterioration.  Critical care was time spent personally by me on the following activities: development of treatment plan with patient and/or surrogate as well as nursing, discussions with consultants, evaluation of patient's response to treatment, examination of patient, obtaining history from patient or surrogate, ordering and performing treatments and interventions, ordering and review of laboratory studies, ordering and review of radiographic studies, pulse oximetry and re-evaluation of patient's condition. Final Clinical Impression(s) / ED Diagnoses Final diagnoses:  Altered mental status, unspecified altered mental status type  Acute respiratory failure with hypercapnia [facility])    Rx / DC Orders ED Discharge Orders    None       Isla Pence, MD 02/14/20 1919

## 2020-02-14 NOTE — Progress Notes (Addendum)
Pharmacy Antibiotic Note  Antonio Ramirez is a 60 y.o. male admitted on 02/14/2020 with sepsis.  Pharmacy has been consulted for vancomycin and cefepime dosing.  Plan: Vancomycin 1250 mg IV x 1 then 1gm IV q24 hours Cefepime 2gm IV q12 hours F/u renal function, cultures and clinical course  Height: 6\' 1"  (185.4 cm) IBW/kg (Calculated) : 79.9  Temp (24hrs), Avg:100.2 F (37.9 C), Min:97.8 F (36.6 C), Max:100.9 F (38.3 C)  Recent Labs  Lab 02/13/20 1652 02/13/20 1949 02/14/20 1723  WBC 8.8  --  19.6*  CREATININE 1.59*  --  1.85*  LATICACIDVEN 2.1* 1.3  --     Estimated Creatinine Clearance: 42.2 mL/min (A) (by C-G formula based on SCr of 1.85 mg/dL (H)).    Allergies  Allergen Reactions  . Opana [Oxymorphone Hcl] Hives    hiv   Thank you for allowing pharmacy to be a part of this patient's care.  Excell Seltzer Poteet 02/14/2020 9:49 PM   Addum:  Will give lovenox 70 mg sq x 1 tonight.  F/u VQ scan

## 2020-02-14 NOTE — ED Notes (Signed)
Date and time results received: 02/14/20 1757 (use smartphrase ".now" to insert current time)  Test: ph and Co2 Critical Value: Ph-7.106 Co2-118  Name of Provider Notified: Gilford Raid MD  Orders Received? Or Actions Taken?: n/a

## 2020-02-14 NOTE — ED Notes (Signed)
Pt intubated. Color change noted

## 2020-02-14 NOTE — ED Notes (Signed)
MD at bedside to intubate patient.

## 2020-02-14 NOTE — ED Notes (Signed)
Pt now appears to be decerebrate posturing. Admitting MD notified.

## 2020-02-14 NOTE — ED Triage Notes (Signed)
Arrived via EMS for reports of altered level of con. Came yesterday for same issue. Wife reports pt is abusing pain medication at home (Gabapentin and Oxycodone).  O2 sat in 70s upon arrival - ems applied 6L to increase to >90% Pt responding to voice. Oriented to person.

## 2020-02-14 NOTE — Discharge Instructions (Signed)
We think your somnolence with you to your narcotic pain medication.  He was slightly dehydrated here in the emergency room.  You are given some fluids.  Please have your blood work rechecked by primary care doctor.  Return the emergency department for any chest pain, difficulty breathing, inability to wake up or any worsening treatments.

## 2020-02-14 NOTE — ED Notes (Signed)
Date and time results received: 02/14/20 2306  Test: Lactic Acid Critical Value: 4.6  Name of Provider Notified: Dr. Arlyce Dice  Orders Received? Or Actions Taken?: n/a

## 2020-02-14 NOTE — Progress Notes (Signed)
Turned respiratory rate up to 23 bpm after seeing ABG results. RN notified.

## 2020-02-14 NOTE — H&P (Addendum)
History and Physical    Antonio Ramirez PJK:[id number] DOB: Aug 24, 1960 DOA: 02/14/2020  PCP: [name], DO   Patient coming from: Home  I have personally briefly reviewed patient's old medical records in [place]  Chief Complaint: Altered mental status  HPI: Antonio Ramirez is a 59 y.o. male with medical history significant for opiate dependence, COPD, carotid artery disease, depression. Patient was brought to the ED reports of altered mental status.  He came yesterday for same issue, but this resolved and was back to baseline hence was discharged home.  On my evaluation patient is intubated.  History is obtained from chart review and talking to patient's significant other Antonio Ramirez on the phone. Patient reports that this morning, patient level of consciousness started declining again.  He did not appear short of breath and did not complain of difficulty breathing.  No cough.  No pain with urination.  She denies abuse of his pain medications.  She reports that patient last took his long-acting oxycodone at 3 AM early this morning and has not taken any since then.  He takes as needed oxycodone maybe once to twice a day, has not taken any today.  He has chronic back pain.  No alcohol abuse.  On EMS arrival at home, patient's O2 sats was 70% on room air.  Received 2 doses of [place] Vaccine- April/May.  ED Course:  O2 sats 90% on 4 L placed on 15 L nonrebreather.  T-max 100.9, heart rate initially one twenties improved ninety eighties, initial tachypnea to 34, blood pressure systolic 98 - 809, WBC 98.3.  Ammonia  66.  Creatinine elevated 1.85.  ABG showed pH of 7.1, PCO2 of 118, PO2 127.  Portable chest x-ray COPD without acute process.  UDS positive for opiates and cannabinoids.  2 mg of Narcan was given, with brief improvement in mental status, and then patient vomited, additional Narcan given and patient was placed on Narcan drip, but mentation did not improve subsequently.  With  altered mentation and significant respiratory acidosis, patient was intubated in the ED for acute hypoxic and hypercarbic respiratory failure.  Review of Systems: Unable to obtain, patient sedated and intubated.  Past Medical History:  Diagnosis Date  . CAD (coronary artery disease)   . COPD (chronic obstructive pulmonary disease) ([place])   . Opioid dependence ([place])   . Smoker     Past Surgical History:  Procedure Laterality Date  . cardiac bypass    . CARDIAC SURGERY    . COLON SURGERY       reports that he has been smoking cigarettes. He has been smoking about 1.00 pack per day. He has never used smokeless tobacco. He reports that he does not drink alcohol and does not use drugs.  Allergies  Allergen Reactions  . Opana [Oxymorphone Hcl] Hives    hiv    Family History  Problem Relation Age of Onset  . COPD Mother     Prior to Admission medications   Medication Sig Start Date End Date Taking? Authorizing Provider  albuterol (VENTOLIN HFA) 108 (90 Base) MCG/ACT inhaler Inhale 1-2 puffs into the lungs every 6 (six) hours as needed for wheezing or shortness of breath. 06/03/19   Murlean Iba, MD  AMITIZA 24 MCG capsule Take 24 mcg by mouth 2 (two) times daily with a meal. 05/17/19   [provider]  aspirin EC 81 MG tablet Take 1 tablet (81 mg total) by mouth daily. 06/03/19   Wynetta Emery,  Clanford L, MD  aspirin-acetaminophen-caffeine (EXCEDRIN MIGRAINE) 250-250-65 MG tablet Take 2 tablets by mouth every 6 (six) hours as needed for headache.    [provider]  atorvastatin (LIPITOR) 40 MG tablet Take 1 tablet (40 mg total) by mouth daily at 6 PM. 06/03/19   Johnson, Clanford L, MD  calcitRIOL (ROCALTROL) 0.5 MCG capsule Take 0.5 mcg by mouth daily. 03/16/19   [provider]  citalopram (CELEXA) 10 MG tablet Take 1 tablet (10 mg total) by mouth daily. 07/04/19   Patrecia Pour, MD  clopidogrel (PLAVIX) 75 MG tablet Take 75 mg by mouth daily. 06/27/19    [provider]  gabapentin (NEURONTIN) 600 MG tablet Take 600 mg by mouth at bedtime.  05/27/19   [provider]  isosorbide mononitrate (IMDUR) 30 MG 24 hr tablet Take 30 mg by mouth daily.  01/22/17   [provider]  omeprazole (PRILOSEC) 20 MG capsule Take 20 mg by mouth daily before breakfast. 30 min prior to breakfast. 04/13/19   [provider]  oxyCODONE (ROXICODONE) 15 MG immediate release tablet Take 1 tablet (15 mg total) by mouth 3 (three) times daily as needed for pain. 07/04/19   Patrecia Pour, MD  polyethylene glycol (MIRALAX / Floria Raveling) packet Take 17 g by mouth daily. Patient not taking: Reported on 02/13/2020 02/11/17   Long, Wonda Olds, MD  senna-docusate (SENOKOT-S) 8.6-50 MG tablet Take 2 tablets by mouth at bedtime as needed for mild constipation. 06/03/19   Johnson, Eldridge Dace, MD  SYMBICORT 160-4.5 MCG/ACT inhaler Inhale 2 puffs into the lungs 2 (two) times daily. 04/29/19   [provider]  XTAMPZA ER 36 MG C12A Take 1 capsule (36 mg total) by mouth every 12 (twelve) hours. 07/04/19   Patrecia Pour, MD    Physical Exam: Exam limited as patient is intubated. Vitals:   02/14/20 1730 02/14/20 1745 02/14/20 1822 02/14/20 1841  BP: (!) 136/119 109/79  106/75  Pulse: (!) 117 (!) 116  84  Resp: (!) 24 (!) 34  20  Temp:      TempSrc:      SpO2: 99% 100%  100%  Height:   6\' 1"  (1.854 m)     Constitutional: Sedated moving extremities, intubated Vitals:   02/14/20 1730 02/14/20 1745 02/14/20 1822 02/14/20 1841  BP: (!) 136/119 109/79  106/75  Pulse: (!) 117 (!) 116  84  Resp: (!) 24 (!) 34  20  Temp:      TempSrc:      SpO2: 99% 100%  100%  Height:   6\' 1"  (1.854 m)    Eyes: PERRL, lids and conjunctivae normal ENMT: Mucous membranes appear dry. Neck: normal, supple, no masses, no thyromegaly Respiratory: Intubated, no appreciable wheezing.  Cardiovascular: Regular rate and rhythm, no murmurs / rubs / gallops.  Swelling to  left foot to lower third of leg, without erythema or redness..  2+ pedal pulses.  Abdomen: no tenderness, no masses palpated. No hepatosplenomegaly. Bowel sounds positive.  Musculoskeletal: no clubbing / cyanosis. No joint deformity upper and lower extremities.  Skin: no rashes, lesions, ulcers. No induration Neurologic: Sedated and intubated. Psychiatric: Sedated and intubated..   Labs on Admission: I have personally reviewed following labs and imaging studies  CBC: Recent Labs  Lab 02/13/20 1652 02/14/20 1723  WBC 8.8 19.6*  NEUTROABS 7.2 16.8*  HGB 16.8 17.1*  HCT 55.7* 58.5*  MCV 94.2 96.4  PLT 270 027   Basic Metabolic Panel: Recent  Labs  Lab 02/13/20 1652 02/14/20 1723  NA 133* 135  K 4.8 4.2  CL 91* 91*  CO2 33* 32  GLUCOSE 118* 145*  BUN 27* 40*  CREATININE 1.59* 1.85*  CALCIUM 10.6* 10.6*   Liver Function Tests: Recent Labs  Lab 02/13/20 1652 02/14/20 1723  AST 35 294*  ALT 23 292*  ALKPHOS 85 96  BILITOT 0.9 1.2  PROT 6.9 7.0  ALBUMIN 3.5 3.5    Recent Labs  Lab 02/14/20 1723  AMMONIA 66*   Coagulation Profile: Recent Labs  Lab 02/13/20 1652  INR 1.3*   CBG: Recent Labs  Lab 02/14/20 1756  GLUCAP 163*   Urine analysis:    Component Value Date/Time   COLORURINE AMBER (A) 02/13/2020 1652   APPEARANCEUR CLOUDY (A) 02/13/2020 1652   LABSPEC 1.028 02/13/2020 1652   PHURINE 5.0 02/13/2020 1652   GLUCOSEU NEGATIVE 02/13/2020 1652   HGBUR NEGATIVE 02/13/2020 1652   BILIRUBINUR SMALL (A) 02/13/2020 1652   KETONESUR 5 (A) 02/13/2020 1652   PROTEINUR 100 (A) 02/13/2020 1652   NITRITE NEGATIVE 02/13/2020 1652   LEUKOCYTESUR NEGATIVE 02/13/2020 1652    Radiological Exams on Admission: CT Head Wo Contrast  Result Date: 02/13/2020 CLINICAL DATA:  Unresponsive EXAM: CT HEAD WITHOUT CONTRAST TECHNIQUE: Contiguous axial images were obtained from the base of the skull through the vertex without intravenous contrast. COMPARISON:  06/29/2019  FINDINGS: Brain: There again noted changes consistent with prior encephalomalacia from ischemic event in the distribution of the right middle cerebral artery involving the right temporal and parietal lobes. The overall appearance is stable. No findings to suggest acute hemorrhage, acute infarction or space-occupying mass lesion are noted. Mild atrophic changes are again seen. Vascular: No hyperdense vessel or unexpected calcification. Skull: Normal. Negative for fracture or focal lesion. Sinuses/Orbits: No acute finding. Other: None. 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 No acute abnormality noted. Electronically Signed   By: Inez Catalina M.D.   On: 02/13/2020 21:30   DG Chest Portable 1 View  Result Date: 02/14/2020 CLINICAL DATA:  Intubated, loss of consciousness, COPD EXAM: PORTABLE CHEST 1 VIEW COMPARISON:  02/14/2020 FINDINGS: 2 frontal views of the chest demonstrate endotracheal tube overlying tracheal air column, tip approximately 3.8 cm above carina. Enteric catheter tip and side port project over the distal thoracic esophagus. Cardiac silhouette is unremarkable. Background emphysema again noted without airspace disease, effusion, or pneumothorax. No acute bony abnormalities. IMPRESSION: 1. Enteric catheter tip and side port projecting over the distal thoracic esophagus. Recommend advancing at least 7 cm to ensure side port placement within the gastric lumen. 2. No complication after intubation. 3. Emphysema. Electronically Signed   By: Randa Ngo M.D.   On: 02/14/2020 18:40   DG Chest Portable 1 View  Result Date: 02/14/2020 CLINICAL DATA:  Shortness of breath, history of COPD EXAM: PORTABLE CHEST 1 VIEW COMPARISON:  02/13/2020 FINDINGS: Emphysema and chronic interstitial changes. No new consolidation or edema. No pleural effusion or pneumothorax. Stable cardiomediastinal contours. IMPRESSION: COPD without acute process in the  chest. Electronically Signed   By: Macy Mis M.D.   On: 02/14/2020 17:32   DG Chest Portable 1 View  Result Date: 02/13/2020 CLINICAL DATA:  Hypotension. EXAM: PORTABLE CHEST 1 VIEW COMPARISON:  Prior chest radiographs 12/16/2019 and earlier. FINDINGS: Heart size within normal limits. Prior median sternotomy/CABG. Aortic atherosclerosis. Unchanged nonspecific chronic prominence of the interstitial lung markings. No appreciable airspace consolidation or frank pulmonary edema.  No evidence of pleural effusion or pneumothorax. No acute bony abnormality identified. ACDF hardware within the lower cervical spine. IMPRESSION: No evidence of acute cardiopulmonary abnormality. Chronic nonspecific prominence of the interstitial lung markings, unchanged. Prior median sternotomy/CABG. Aortic Atherosclerosis (ICD10-I70.0). Electronically Signed   By: Kellie Simmering DO   On: 02/13/2020 18:15    EKG: Pending.  Sinus rhythm.  QTc 435.  No significant ST or T wave changes compared to prior.  Assessment/Plan Principal Problem:   Acute respiratory failure (HCC) Active Problems:   COPD (chronic obstructive pulmonary disease) (HCC)   Opioid dependence (HCC)   Depression   CAD (coronary artery disease)   Acute hypoxic and hypercarbic respiratory failure-O2 sats in ED 90% on 4 L, with significant respiratory acidosis, ABG shows pH of 7.1, PCO2 118.  Intubated and on mechanical ventilation.  History of similar presentation requiring intubation 06/2019, 2/2.  Unintentional opioid overdose.  Today significant other denies significant opioid use.  Brief response to Narcan 2 mg in the ED.  Respiratory failure likely COPD exacerbation, possible opioid-use component. -Continue propofol -As needed fentanyl -NG tube -Please consult critical care in the morning -Repeat ABG -Steroids, antibiotics. -VQ scan in a.m to rule out PE -Therapeutic dose Lovenox x1 for now.  Metabolic encephalopathy-likely due to significant  respiratory acidosis, PCO2 of 118, pH of 7.1, and also Sepsis.  Head Ct done last night- chronic changes of encephalomalacia.  -Mechanical ventilation, antibiotics  Severe sepsis-febrile to 100.9 and leukocytosis of 19.6.  Initial tachycardia to 123.  Blood pressure systolic now 08/65 improved after sepsis fluid bolus.  With lactic acid of 4.6. Creatinine 1.8, close to recent baseline 1.5-1.6.  UA not suggestive of infection.  Portable chest x-ray without acute abnormality.  Covid test negative.  Patient has been vaccinated.  Source of infection not identified at this time. -Broad-spectrum antibiotics IV cefepime, metronidazole and vancomycin. -Trend lactic acid -3 L bolus given, continue D5 N/s @ 100cc/hr  -Obtain blood cultures -Obtain urine cultures - CBC, BMP In the morning  CKD 3B- creatinine 1.8, recent baseline 1.5-1.6. -Hydrate  COPD with acute respiratory failure-likely exacerbated, but patient intubated at this point. -60 mg IV Solu-Medrol twice daily -DuoNebs scheduled as needed -Antibiotics  Left lower extremity swelling -Left lower extremity venous Dopplers.  Carotid artery disease status post CABG 2006.  EKG today without significant changes.  Echo 05/2019 EF 55 to 60% with LV regional wall motion abnormalities. -Aspirin, statins, Imdur held for now while n.p.o.   DVT prophylaxis: Lovenox therapeutic dose x1 only, pending VQ scan results resume DVT prophylaxis Code Status: Full code. Family Communication: Talked to patient's significant other Antonio Ramirez, on the phone. Disposition Plan: >. 2 days, pending improvement in respiratory status, and treatment of sepsis. Consults called: Please consult critical care in the morning Admission status: Inpatient, ICU. I certify that at the point of admission it is my clinical judgment that the patient will require inpatient hospital care spanning beyond 2 midnights from the point of admission due to high intensity of service, high  risk for further deterioration and high frequency of surveillance required. The following factors support the patient status of inpatient: Acute respiratory failure intubated and on mechanical ventilation.  CRITICAL CARE Performed by: Bethena Roys   Total critical care time: 70 minutes  Critical care time was exclusive of separately billable procedures and treating other patients.  Critical care was necessary to treat or prevent imminent or life-threatening deterioration.  Critical care was time spent personally  by me on the following activities: development of treatment plan with patient and/or surrogate as well as nursing, discussions with consultants, evaluation of patient's response to treatment, examination of patient, obtaining history from patient or surrogate, ordering and performing treatments and interventions, ordering and review of laboratory studies, ordering and review of radiographic studies, pulse oximetry and re-evaluation of patient's condition.    Bethena Roys MD [facility]  02/14/2020, 11:17 PM

## 2020-02-15 ENCOUNTER — Inpatient Hospital Stay (HOSPITAL_COMMUNITY): Payer: Medicare Other

## 2020-02-15 ENCOUNTER — Encounter (HOSPITAL_COMMUNITY): Payer: Self-pay | Admitting: Internal Medicine

## 2020-02-15 DIAGNOSIS — Z978 Presence of other specified devices: Secondary | ICD-10-CM | POA: Diagnosis not present

## 2020-02-15 DIAGNOSIS — J441 Chronic obstructive pulmonary disease with (acute) exacerbation: Secondary | ICD-10-CM | POA: Diagnosis not present

## 2020-02-15 DIAGNOSIS — J9601 Acute respiratory failure with hypoxia: Secondary | ICD-10-CM

## 2020-02-15 DIAGNOSIS — I25810 Atherosclerosis of coronary artery bypass graft(s) without angina pectoris: Secondary | ICD-10-CM | POA: Diagnosis not present

## 2020-02-15 LAB — BLOOD GAS, ARTERIAL
Acid-Base Excess: 2.9 mmol/L — ABNORMAL HIGH (ref 0.0–2.0)
Acid-Base Excess: 3.5 mmol/L — ABNORMAL HIGH (ref 0.0–2.0)
Bicarbonate: 27.3 mmol/L (ref 20.0–28.0)
Bicarbonate: 27 mmol/L (ref 20.0–28.0)
FIO2: 40
FIO2: 40
O2 Saturation: 96.9 %
O2 Saturation: 97 %
Patient temperature: 37.4
Patient temperature: 37
pCO2 arterial: 37.7 mmHg (ref 32.0–48.0)
pCO2 arterial: 45.8 mmHg (ref 32.0–48.0)
pH, Arterial: 7.462 — ABNORMAL HIGH (ref 7.350–7.450)
pH, Arterial: 7.403 (ref 7.350–7.450)
pO2, Arterial: 89.9 mmHg (ref 83.0–108.0)
pO2, Arterial: 95.9 mmHg (ref 83.0–108.0)

## 2020-02-15 LAB — BLOOD CULTURE ID PANEL (REFLEXED) - BCID2

## 2020-02-15 LAB — BASIC METABOLIC PANEL
Anion gap: 13 (ref 5–15)
BUN: 41 mg/dL — ABNORMAL HIGH (ref 6–20)
CO2: 25 mmol/L (ref 22–32)
Calcium: 9.8 mg/dL (ref 8.9–10.3)
Chloride: 98 mmol/L (ref 98–111)
Creatinine, Ser: 1.47 mg/dL — ABNORMAL HIGH (ref 0.61–1.24)
GFR, Estimated: 54 mL/min — ABNORMAL LOW (ref >60)
Glucose, Bld: 143 mg/dL — ABNORMAL HIGH (ref 70–99)
Potassium: 4.4 mmol/L (ref 3.5–5.1)
Sodium: 136 mmol/L (ref 135–145)

## 2020-02-15 LAB — CBC
HCT: 54.8 % — ABNORMAL HIGH (ref 39.0–52.0)
Hemoglobin: 16.6 g/dL (ref 13.0–17.0)
MCH: 28.3 pg (ref 26.0–34.0)
MCHC: 30.3 g/dL (ref 30.0–36.0)
MCV: 93.5 fL (ref 80.0–100.0)
Platelets: 213 10*3/uL (ref 150–400)
RBC: 5.86 MIL/uL — ABNORMAL HIGH (ref 4.22–5.81)
RDW: 19.3 % — ABNORMAL HIGH (ref 11.5–15.5)
WBC: 19.4 10*3/uL — ABNORMAL HIGH (ref 4.0–10.5)
nRBC: 0.2 % (ref 0.0–0.2)

## 2020-02-15 LAB — GLUCOSE, CAPILLARY
Glucose-Capillary: 132 mg/dL — ABNORMAL HIGH (ref 70–99)
Glucose-Capillary: 134 mg/dL — ABNORMAL HIGH (ref 70–99)
Glucose-Capillary: 148 mg/dL — ABNORMAL HIGH (ref 70–99)
Glucose-Capillary: 162 mg/dL — ABNORMAL HIGH (ref 70–99)
Glucose-Capillary: 170 mg/dL — ABNORMAL HIGH (ref 70–99)
Glucose-Capillary: 182 mg/dL — ABNORMAL HIGH (ref 70–99)

## 2020-02-15 LAB — HEMOGLOBIN A1C
Hgb A1c MFr Bld: 6.9 % — ABNORMAL HIGH (ref 4.8–5.6)
Mean Plasma Glucose: 151.33 mg/dL

## 2020-02-15 LAB — LACTIC ACID, PLASMA: Lactic Acid, Venous: 3.1 mmol/L (ref 0.5–1.9)

## 2020-02-15 LAB — MRSA PCR SCREENING: MRSA by PCR: NEGATIVE

## 2020-02-15 MED ORDER — INFLUENZA VAC SPLIT QUAD 0.5 ML IM SUSY: 0.5000 mL | PREFILLED_SYRINGE | INTRAMUSCULAR | Status: DC

## 2020-02-15 MED ORDER — CHLORHEXIDINE GLUCONATE 0.12% ORAL RINSE (MEDLINE KIT)
15.0000 mL | Freq: Two times a day (BID) | OROMUCOSAL | Status: DC
  Administered 2020-02-15 – 2020-02-26 (×21): 15 mL via OROMUCOSAL

## 2020-02-15 MED ORDER — HYDROCORTISONE NA SUCCINATE PF 100 MG IJ SOLR
100.0000 mg | Freq: Two times a day (BID) | INTRAMUSCULAR | Status: DC
  Administered 2020-02-15 – 2020-02-16 (×2): 100 mg via INTRAVENOUS
  Filled 2020-02-15 (×3): qty 2

## 2020-02-15 MED ORDER — CHLORHEXIDINE GLUCONATE CLOTH 2 % EX PADS
6.0000 | MEDICATED_PAD | Freq: Every day | CUTANEOUS | Status: DC
  Administered 2020-02-15 – 2020-03-09 (×23): 6 via TOPICAL

## 2020-02-15 MED ORDER — CEFAZOLIN SODIUM-DEXTROSE 2-4 GM/100ML-% IV SOLN
2.0000 g | Freq: Three times a day (TID) | INTRAVENOUS | Status: AC
  Administered 2020-02-15 – 2020-02-19 (×14): 2 g via INTRAVENOUS
  Filled 2020-02-15 (×13): qty 100

## 2020-02-15 MED ORDER — FUROSEMIDE 10 MG/ML IJ SOLN
40.0000 mg | Freq: Once | INTRAMUSCULAR | Status: AC
  Administered 2020-02-15: 40 mg via INTRAVENOUS
  Filled 2020-02-15: qty 4

## 2020-02-15 MED ORDER — INSULIN ASPART 100 UNIT/ML ~~LOC~~ SOLN: 0.0000 [IU] | Freq: Every day | SUBCUTANEOUS | Status: DC

## 2020-02-15 MED ORDER — ASPIRIN EC 81 MG PO TBEC
81.0000 mg | DELAYED_RELEASE_TABLET | Freq: Every day | ORAL | Status: DC
  Filled 2020-02-15: qty 1

## 2020-02-15 MED ORDER — INSULIN ASPART 100 UNIT/ML ~~LOC~~ SOLN: 0.0000 [IU] | Freq: Three times a day (TID) | SUBCUTANEOUS | Status: DC

## 2020-02-15 MED ORDER — PNEUMOCOCCAL VAC POLYVALENT 25 MCG/0.5ML IJ INJ: 0.5000 mL | INJECTION | INTRAMUSCULAR | Status: DC

## 2020-02-15 MED ORDER — SODIUM CHLORIDE 0.9 % IV BOLUS
500.0000 mL | Freq: Once | INTRAVENOUS | Status: AC
  Administered 2020-02-15: 500 mL via INTRAVENOUS

## 2020-02-15 MED ORDER — ORAL CARE MOUTH RINSE
15.0000 mL | OROMUCOSAL | Status: DC
  Administered 2020-02-15 – 2020-02-20 (×43): 15 mL via OROMUCOSAL

## 2020-02-15 MED ORDER — CLOPIDOGREL BISULFATE 75 MG PO TABS
75.0000 mg | ORAL_TABLET | Freq: Every day | ORAL | Status: DC
  Administered 2020-02-15 – 2020-02-21 (×6): 75 mg via ORAL
  Filled 2020-02-15 (×6): qty 1

## 2020-02-15 MED ORDER — TECHNETIUM TO 99M ALBUMIN AGGREGATED
4.0000 | Freq: Once | INTRAVENOUS | Status: AC | PRN
  Administered 2020-02-15: 4.4 via INTRAVENOUS

## 2020-02-15 MED ORDER — ENOXAPARIN SODIUM 40 MG/0.4ML ~~LOC~~ SOLN
40.0000 mg | SUBCUTANEOUS | Status: DC
  Administered 2020-02-15 – 2020-02-21 (×7): 40 mg via SUBCUTANEOUS
  Filled 2020-02-15 (×7): qty 0.4

## 2020-02-15 NOTE — Progress Notes (Signed)
PHARMACY - PHYSICIAN COMMUNICATION CRITICAL VALUE ALERT - BLOOD CULTURE IDENTIFICATION (BCID)  Antonio Ramirez is an 59 y.o. male who presented to [facility] on 02/14/2020 with a chief complaint of sepsis  Assessment:  59 year old male with bacteremia (include suspected source if known)  Name of physician (or Provider) Contacted: Dr Roxan Hockey  Current antibiotics: vancomycin and cefepime, stopped, changed to cefazolin 2gm iv q8h  Changes to prescribed antibiotics recommended:  Recommendations accepted by provider  Results for orders placed or performed during the hospital encounter of 02/13/20  Blood Culture ID Panel (Reflexed) (Collected: 02/13/2020  4:52 PM)  Result Value Ref Range   Enterococcus faecalis NOT DETECTED NOT DETECTED   Enterococcus Faecium NOT DETECTED NOT DETECTED   Listeria monocytogenes NOT DETECTED NOT DETECTED   Staphylococcus species DETECTED (A) NOT DETECTED   Staphylococcus aureus (BCID) NOT DETECTED NOT DETECTED   Staphylococcus epidermidis DETECTED (A) NOT DETECTED   Staphylococcus lugdunensis NOT DETECTED NOT DETECTED   Streptococcus species NOT DETECTED NOT DETECTED   Streptococcus agalactiae NOT DETECTED NOT DETECTED   Streptococcus pneumoniae NOT DETECTED NOT DETECTED   Streptococcus pyogenes NOT DETECTED NOT DETECTED   A.calcoaceticus-baumannii NOT DETECTED NOT DETECTED   Bacteroides fragilis NOT DETECTED NOT DETECTED   Enterobacterales NOT DETECTED NOT DETECTED   Enterobacter cloacae complex NOT DETECTED NOT DETECTED   Escherichia coli NOT DETECTED NOT DETECTED   Klebsiella aerogenes NOT DETECTED NOT DETECTED   Klebsiella oxytoca NOT DETECTED NOT DETECTED   Klebsiella pneumoniae NOT DETECTED NOT DETECTED   Proteus species NOT DETECTED NOT DETECTED   Salmonella species NOT DETECTED NOT DETECTED   Serratia marcescens NOT DETECTED NOT DETECTED   Haemophilus influenzae NOT DETECTED NOT DETECTED   Neisseria meningitidis NOT DETECTED NOT  DETECTED   Pseudomonas aeruginosa NOT DETECTED NOT DETECTED   Stenotrophomonas maltophilia NOT DETECTED NOT DETECTED   Candida albicans NOT DETECTED NOT DETECTED   Candida auris NOT DETECTED NOT DETECTED   Candida glabrata NOT DETECTED NOT DETECTED   Candida krusei NOT DETECTED NOT DETECTED   Candida parapsilosis NOT DETECTED NOT DETECTED   Candida tropicalis NOT DETECTED NOT DETECTED   Cryptococcus neoformans/gattii NOT DETECTED NOT DETECTED   Methicillin resistance mecA/C NOT DETECTED NOT DETECTED    Terena Bohan 02/15/2020  10:44 AM

## 2020-02-15 NOTE — Progress Notes (Signed)
Arterial line has been attempted by this RT and Shelton Silvas, RRT several times.  Last attempt yielded a good flow but catheter kinked upon insertion.  Patient's artery appears to be very thready and may need to be acquired by other means.

## 2020-02-15 NOTE — Consult Note (Signed)
NAME:  Antonio Ramirez, MRN:  [id number], DOB:  [birth year], LOS: 1 ADMISSION DATE:  02/14/2020, CONSULTATION DATE:  11/3 REFERRING MD:  Emokpae/triad CHIEF COMPLAINT:  Vent dep resp failure   Brief History    56 yowm smoker with clinical evidence of copd and h/o drug abuse admitted with severe hypercarbic resp failure and intubated in ER p failed to respond to narcan pm 02/14/20 and PPCM service consulted 11/3 re vent dep/ hemodynamic instability.   History of present illness    per epic records: 59 y.o. male with medical history significant for opiate dependence, COPD, carotid artery disease, depression. Patient was brought to the ED reports of altered mental status.  He came 11/1 for same issue, but this resolved and was back to baseline hence was discharged home. History is obtained from chart review and talking to patient's significant other [name] on the phone. By report  patient level of consciousness started declining again.  He did not appear short of breath and did not complain of difficulty breathing.  No cough.  No pain with urination.  No known/ witnessed  abuse of his pain medications.  Report  that patient last took his long-acting oxycodone at 3 AM   11/2   and has not taken any since then.  He takes as needed oxycodone maybe once to twice a day, has not taken any 11/2.  He has chronic back pain.  No alcohol abuse. On EMS arrival at home, patient's O2 sats was 70% on room air.  Received 2 doses of [place] Vaccine- April/May.  ED Course:  O2 sats 90% on 4 L placed on 15 L nonrebreather.  T-max 100.9, heart rate initially one twenties improved ninety eighties, initial tachypnea to 34, blood pressure systolic 98 - 622, WBC 29.7.  Ammonia  66.  Creatinine elevated 1.85.  ABG showed pH of 7.1, PCO2 of 118, PO2 127.  Portable chest x-ray COPD without acute process.  UDS positive for opiates and cannabinoids.  2 mg of Narcan was given, with brief improvement in mental status, and then  patient vomited, additional Narcan given and patient was placed on Narcan drip, but mentation did not improve subsequently.  With altered mentation and significant respiratory acidosis, patient was intubated in the ED for acute hypoxic and hypercarbic respiratory failure.   Past Medical History     Significant Hospital Events      Consults:  PCCM 11/3   Procedures:  Oral ET 11/2 >>>  Significant Diagnostic Tests:   L LE venous doplers 11/3 neg  NM Perfusion study 11/3 > No scintigraphic evidence of pulmonary embolism  Micro Data:  BC x 2  11/3 >>> MRSA PCR  11/3  Neg    Antimicrobials:  maxepime  11/2 Flagyl  11/2 Vanc  11/2  Ancef 11/3 >>>   Scheduled Meds: . aspirin EC  81 mg Oral Q breakfast  . chlorhexidine gluconate (MEDLINE KIT)  15 mL Mouth Rinse BID  . Chlorhexidine Gluconate Cloth  6 each Topical Daily  . clopidogrel  75 mg Oral Daily  . docusate  100 mg Oral BID  . enoxaparin (LOVENOX) injection  40 mg Subcutaneous Q24H  . hydrocortisone sodium succinate  100 mg Intravenous Q12H  . [START ON 02/16/2020] influenza vac split quadrivalent PF  0.5 mL Intramuscular Tomorrow-1000  . insulin aspart  0-5 Units Subcutaneous QHS  . insulin aspart  0-6 Units Subcutaneous TID WC  . mouth rinse  15 mL Mouth Rinse 10 times per  day  . [START ON 02/16/2020] pneumococcal 23 valent vaccine  0.5 mL Intramuscular Tomorrow-1000   Continuous Infusions: . sodium chloride    .  ceFAZolin (ANCEF) IV 2 g (02/15/20 1221)  . dextrose 5 % and 0.9% NaCl 100 mL/hr at 02/15/20 0928  . famotidine (PEPCID) IV 20 mg (02/15/20 0936)  . propofol (DIPRIVAN) infusion 15 mcg/kg/min (02/15/20 1400)   PRN Meds:.Place/Maintain arterial line **AND** sodium chloride, acetaminophen **OR** acetaminophen, fentaNYL (SUBLIMAZE) injection, fentaNYL (SUBLIMAZE) injection, ipratropium-albuterol, ondansetron **OR** ondansetron (ZOFRAN) IV   Interim history/subjective:  Unresponsive white male not breathing  over backup rate but moderate air trapping   Objective   Blood pressure 131/85, pulse 89, temperature (!) 97.4 F (36.3 C), temperature source Axillary, resp. rate 20, height _0  (1.854 m), weight 81.9 kg, SpO2 100 %.    Vent Mode: PRVC FiO2 (%):  [40 %-100 %] 40 % Set Rate:  [18 bmp-23 bmp] 18 bmp Vt Set:  [640 mL-650 mL] 650 mL PEEP:  [5 cmH20] 5 cmH20 Plateau Pressure:  [16 cmH20-27 cmH20] 16 cmH20   Intake/Output Summary (Last 24 hours) at 02/15/2020 1640 Last data filed at 02/15/2020 1500 Gross per 24 hour  Intake 3921.34 ml  Output 175 ml  Net 3746.34 ml   Filed Weights   02/15/20 0114 02/15/20 0300  Weight: 83.1 kg 81.9 kg    Examination: Tmax  100.9 General: disheveled appearance/ sedated on vent  HENT: oral et  Lungs: distant exp rhonchi Cardiovascular: RRR no s3 Abdomen: mod distended soft Extremities: warm with trace edema     I personally reviewed images and agree with radiology impression as follows:  CXR:   11/3  The ETT tip is stable above the carina. There is a nasogastric tube with tip and side port below the GE junction. Heart size appears normal. No pleural effusion or airspace consolidation. Pulmonary vascular congestion noted. No overt edema.   Resolved Hospital Problem list      Assessment & Plan:  1) vent dep acute hypercabic, hypoxemic resp failure s/p apparent drug od in pt with underlying copd/ still smoking ? Severity > eliminate air trapping as below, keep sedated overnight then ? Extubate in am    2) Hypotension ? Due to air trapping from copd  Vs sepsis> vent changes made = reduce RR and decrease Ti to 0.80  > repeat abg's one hour >> abx per triad, changed to stress Hydrocortisone as really not wheezing so no need for solumedrol  >> vol expand as needed to overcome hemodynamic effects of autopeep  3)  Mild AKI ? Due to hypotension Lab Results  Component Value Date   CREATININE 1.47 (H) 02/15/2020   CREATININE 1.85 (H)  02/14/2020   CREATININE 1.59 (H) 02/13/2020   >> keep vasc volume up, monitor uop, avoid nephrotoxins    4) LFT's elevated ? Shock liver?  - no witnessed tylenol od with level at last admit undectable   Best practice:  Diet: per triad Pain/Anxiety/Delirium protocol (if indicated): per triad VAP protocol (if indicated):  DVT prophylaxis:  lovenox GI prophylaxis: pepci Glucose control: per triad  Mobility: bed rest  Code Status: per triad  Family Communication: per triad Disposition: ICU  Labs   CBC: Recent Labs  Lab 02/13/20 1652 02/14/20 1723 02/15/20 0142  WBC 8.8 19.6* 19.4*  NEUTROABS 7.2 16.8*  --   HGB 16.8 17.1* 16.6  HCT 55.7* 58.5* 54.8*  MCV 94.2 96.4 93.5  PLT 270 352 213  Basic Metabolic Panel: Recent Labs  Lab 02/13/20 1652 02/14/20 1723 02/15/20 0142  NA 133* 135 136  K 4.8 4.2 4.4  CL 91* 91* 98  CO2 33* 32 25  GLUCOSE 118* 145* 143*  BUN 27* 40* 41*  CREATININE 1.59* 1.85* 1.47*  CALCIUM 10.6* 10.6* 9.8   GFR: Estimated Creatinine Clearance: 60.4 mL/min (A) (by C-G formula based on SCr of 1.47 mg/dL (H)). Recent Labs  Lab 02/13/20 1652 02/13/20 1949 02/14/20 1723 02/14/20 2158 02/15/20 0142  WBC 8.8  --  19.6*  --  19.4*  LATICACIDVEN 2.1* 1.3  --  4.6* 3.1*    Liver Function Tests: Recent Labs  Lab 02/13/20 1652 02/14/20 1723  AST 35 294*  ALT 23 292*  ALKPHOS 85 96  BILITOT 0.9 1.2  PROT 6.9 7.0  ALBUMIN 3.5 3.5   No results for input(s): LIPASE, AMYLASE in the last 168 hours. Recent Labs  Lab 02/14/20 1723  AMMONIA 66*    ABG    Component Value Date/Time   PHART 7.462 (H) 02/15/2020 0932   PCO2ART 37.7 02/15/2020 0932   PO2ART 89.9 02/15/2020 0932   HCO3 27.3 02/15/2020 0932   O2SAT 96.9 02/15/2020 0932     Coagulation Profile: Recent Labs  Lab 02/13/20 1652  INR 1.3*    Cardiac Enzymes: No results for input(s): CKTOTAL, CKMB, CKMBINDEX, TROPONINI in the last 168 hours.  HbA1C: Hgb A1c MFr Bld    Date/Time Value Ref Range Status  06/01/2019 07:34 AM 6.2 (H) 4.8 - 5.6 % Final    Comment:    (NOTE) Pre diabetes:          5.7%-6.4% Diabetes:              >6.4% Glycemic control for   <7.0% adults with diabetes     CBG: Recent Labs  Lab 02/14/20 1756 02/15/20 0028 02/15/20 0330 02/15/20 0809 02/15/20 1244  GLUCAP 163* 148* 182* 162* 170*    Review of Systems:     Past Medical History  He,  has a past medical history of CAD (coronary artery disease), COPD (chronic obstructive pulmonary disease) ([place]), Opioid dependence ([place]), and Smoker.   Surgical History    Past Surgical History:  Procedure Laterality Date  . cardiac bypass    . CARDIAC SURGERY    . COLON SURGERY       Social History   reports that he has been smoking cigarettes. He has been smoking about 1.00 pack per day. He has never used smokeless tobacco. He reports that he does not drink alcohol and does not use drugs.   Family History   His family history includes COPD in his mother.   Allergies Allergies  Allergen Reactions  . Opana [Oxymorphone Hcl] Hives    hiv     Home Medications  Prior to Admission medications  - can't verify what actually was taking   Medication Sig Start Date End Date Taking? Authorizing Provider  albuterol (VENTOLIN HFA) 108 (90 Base) MCG/ACT inhaler Inhale 1-2 puffs into the lungs every 6 (six) hours as needed for wheezing or shortness of breath. 06/03/19  Yes Johnson, Clanford L, MD  AMITIZA 24 MCG capsule Take 24 mcg by mouth 2 (two) times daily with a meal. 05/17/19  Yes [provider]  aspirin EC 81 MG tablet Take 1 tablet (81 mg total) by mouth daily. 06/03/19  Yes Johnson, Clanford L, MD  aspirin-acetaminophen-caffeine (EXCEDRIN MIGRAINE) [phone] MG tablet Take 2 tablets by mouth every  6 (six) hours as needed for headache.   Yes [provider]  atorvastatin (LIPITOR) 40 MG tablet Take 1 tablet (40 mg total) by mouth daily at 6 PM. 06/03/19  Yes  Johnson, Clanford L, MD  calcitRIOL (ROCALTROL) 0.5 MCG capsule Take 0.5 mcg by mouth daily. 03/16/19  Yes [provider]  citalopram (CELEXA) 10 MG tablet Take 1 tablet (10 mg total) by mouth daily. 07/04/19  Yes Patrecia Pour, MD  clopidogrel (PLAVIX) 75 MG tablet Take 75 mg by mouth daily. 06/27/19  Yes [provider]  gabapentin (NEURONTIN) 600 MG tablet Take 600 mg by mouth at bedtime.  05/27/19  Yes [provider]  isosorbide mononitrate (IMDUR) 30 MG 24 hr tablet Take 30 mg by mouth daily.  01/22/17  Yes [provider]  omeprazole (PRILOSEC) 20 MG capsule Take 20 mg by mouth daily before breakfast. 30 min prior to breakfast. 04/13/19  Yes [provider]  oxyCODONE (ROXICODONE) 15 MG immediate release tablet Take 1 tablet (15 mg total) by mouth 3 (three) times daily as needed for pain. 07/04/19  Yes Patrecia Pour, MD  senna-docusate (SENOKOT-S) 8.6-50 MG tablet Take 2 tablets by mouth at bedtime as needed for mild constipation. 06/03/19  Yes Johnson, Clanford L, MD  SYMBICORT 160-4.5 MCG/ACT inhaler Inhale 2 puffs into the lungs 2 (two) times daily. 04/29/19  Yes [provider]  XTAMPZA ER 36 MG C12A Take 1 capsule (36 mg total) by mouth every 12 (twelve) hours. 07/04/19  Yes Patrecia Pour, MD  polyethylene glycol (MIRALAX / GLYCOLAX) packet Take 17 g by mouth daily. Patient not taking: Reported on 02/13/2020 02/11/17   Long, Wonda Olds, MD    The patient is critically ill with multiple organ systems failure and requires high complexity decision making for assessment and support, frequent evaluation and titration of therapies, application of advanced monitoring technologies and extensive interpretation of multiple databases. Critical Care Time devoted to patient care services described in this note is 45 minutes.    Christinia Gully, MD Pulmonary and [place] [phone]   After 7:00 pm call Elink   [phone]

## 2020-02-15 NOTE — Progress Notes (Addendum)
Patient Demographics:    Antonio Ramirez, is a 59 y.o. male, DOB - [birth year], DTO:[id number]  Admit date - 02/14/2020   Admitting Physician Ejiroghene Arlyce Dice, MD  Outpatient Primary MD for the patient is Antonio Court, DO  LOS - 1   Chief Complaint  Patient presents with  . Altered Mental Status        Subjective:    Altamese Dent today remains intubated and sedated- -additional history obtained from patient's POA by phone -Episodes of hemodynamic instability persist with erratic blood pressures  Assessment  & Plan :    Active Problems:   Acute respiratory failure with hypoxia (HCC)   Endotracheally intubated   Opioid dependence (HCC)   COPD with acute exacerbation (HCC)   Depression   CAD (coronary artery disease)  Brief Summary:- a58 y.o.malewith a history of COPD, ongoing tobacco use, CAD s/p CABG, carotid artery disease,  chronic back pain with opioid dependence, and opioid overdose as well as chronic depression admitted on 02/14/2020 with acute hypoxic respiratory failure -Patient had a similar presentation in March 2021 when he was intubated for altered mentation subsequently did okay was extubated  A/p  1)Acute hypoxic and hypercapnic respiratory failure--- initially thought to be from a combination of opiate abuse and COPD--remains intubated and sedated, pulmonary consult from Dr. Melvyn Novas appreciated -Minimal response to Narcan -Continue ventilator support -ABG improved--specifically respiratory acidosis has resolved -VQ scan negative for PE -Lower extremity venous Dopplers without acute DVT --Chest x-ray on 02/15/2020 with possible pulm venous congestion--Lasix given  2)SIRS-- meets SIRS criteria, sepsis Not Confirmed at this time as source of infection yet to be identified --There is suspicion for possible sepsis with preliminary blood cultures with staph epi-contaminant  Versus infection -Continue IV Ancef, repeat blood cultures on 02/16/2020  3)Staph Epi bacteremia and possible severe sepsis--- please see #2 above --Episodes of hemodynamic instability persist with erratic blood pressures -Get echo -WBC 19.4 -Fevers resolved  4) acute COPD exacerbation--- bronchodilators and steroids as ordered, Dr Melvyn Novas switched patient to hydrocortisone from Solu-Medrol due to hemodynamic instability  5)CAD---s/p CABG 2006 and PVD--- Echo 05/2019 EF 55 to 60% with LV regional wall motion abnormalities. -Okay to restart aspirin and Plavix -Repeat echo requested  6)AKI----acute kidney injury on CKD stage -#B --   creatinine is down to 1.47 (peak was 1.85)  ,  baseline creatinine = 1.5   renally adjust medications, avoid nephrotoxic agents / dehydration  / hypotension -Renal function improving with hydration  7)Social/Ethics--plan of care and advanced directive discussed with patient ex-wife who is also his POA--patient is a full code with full scope of treatment  8)FEN--patient is n.p.o., continue dextrose solution, check CBGs and adjust sliding scale  CRITICAL CARE Performed by: Roxan Hockey   Total critical care time: 49 minutes  Critical care time was exclusive of separately billable procedures and treating other patients.  Critical care was necessary to treat or prevent imminent or life-threatening deterioration. Vent Settings- -PRVC/40/5/23/640  Critical care was time spent personally by me on the following activities: development of treatment plan with patient and/or surrogate as well as nursing, discussions with consultants, evaluation of patient's response to treatment, examination of patient, obtaining history from patient or surrogate, ordering and performing treatments and  interventions, ordering and review of laboratory studies, ordering and review of radiographic studies, pulse oximetry and re-evaluation of patient's condition.   Disposition/Need  for in-Hospital Stay- patient unable to be discharged at this time due to --remains intubated, sedated, requiring IV antibiotics pending repeat cultures, requiring IV fluids and hydrocortisone for pressor support  Status is: Inpatient  Remains inpatient appropriate because:remains intubated, sedated, requiring IV antibiotics pending repeat cultures, requiring IV fluids and hydrocortisone for pressor support   Disposition: The patient is from: Home              Anticipated d/c is to: Home              Anticipated d/c date is: > 3 days              Patient currently is not medically stable to d/c. Barriers: Not Clinically Stable- remains intubated, sedated, requiring IV antibiotics pending repeat cultures, requiring IV fluids and hydrocortisone for pressor support   Code Status : full  Family Communication:   Discussed with patient's ex- wife/POA  Consults  :  PCCM  DVT Prophylaxis  :  Lovenox -  - SCDs    Lab Results  Component Value Date   PLT 213 02/15/2020    Inpatient Medications  Scheduled Meds: . chlorhexidine gluconate (MEDLINE KIT)  15 mL Mouth Rinse BID  . Chlorhexidine Gluconate Cloth  6 each Topical Daily  . docusate  100 mg Oral BID  . hydrocortisone sodium succinate  100 mg Intravenous Q12H  . [START ON 02/16/2020] influenza vac split quadrivalent PF  0.5 mL Intramuscular Tomorrow-1000  . mouth rinse  15 mL Mouth Rinse 10 times per day  . [START ON 02/16/2020] pneumococcal 23 valent vaccine  0.5 mL Intramuscular Tomorrow-1000   Continuous Infusions: . sodium chloride    .  ceFAZolin (ANCEF) IV 2 g (02/15/20 1221)  . dextrose 5 % and 0.9% NaCl 100 mL/hr at 02/15/20 0928  . famotidine (PEPCID) IV 20 mg (02/15/20 0936)  . propofol (DIPRIVAN) infusion 30 mcg/kg/min (02/15/20 1111)   PRN Meds:.Place/Maintain arterial line **AND** sodium chloride, acetaminophen **OR** acetaminophen, fentaNYL (SUBLIMAZE) injection, fentaNYL (SUBLIMAZE) injection, ipratropium-albuterol,  ondansetron **OR** ondansetron (ZOFRAN) IV    Anti-infectives (From admission, onward)   Start     Dose/Rate Route Frequency Ordered Stop   02/15/20 2200  vancomycin (VANCOCIN) IVPB 1000 mg/200 mL premix  Status:  Discontinued        1,000 mg 200 mL/hr over 60 Minutes Intravenous Every 24 hours 02/14/20 2149 02/15/20 1044   02/15/20 1130  ceFAZolin (ANCEF) IVPB 2g/100 mL premix        2 g 200 mL/hr over 30 Minutes Intravenous Every 8 hours 02/15/20 1044     02/14/20 2200  metroNIDAZOLE (FLAGYL) IVPB 500 mg  Status:  Discontinued        500 mg 100 mL/hr over 60 Minutes Intravenous Every 8 hours 02/14/20 2136 02/15/20 1433   02/14/20 2200  vancomycin (VANCOREADY) IVPB 1250 mg/250 mL        1,250 mg 166.7 mL/hr over 90 Minutes Intravenous  Once 02/14/20 2148 02/15/20 0128   02/14/20 2200  ceFEPIme (MAXIPIME) 2 g in sodium chloride 0.9 % 100 mL IVPB  Status:  Discontinued        2 g 200 mL/hr over 30 Minutes Intravenous Every 12 hours 02/14/20 2148 02/15/20 1044        Objective:   Vitals:   02/15/20 1300 02/15/20 1411 02/15/20 1505 02/15/20 1515  BP: (!) 69/51  131/83 116/78  Pulse: 64  92 89  Resp: (!) 23  (!) 21 20  Temp:      TempSrc:      SpO2: 100% 100% 100% 100%  Weight:      Height:        Wt Readings from Last 3 Encounters:  02/15/20 81.9 kg  02/13/20 70.3 kg  12/16/19 80.3 kg     Intake/Output Summary (Last 24 hours) at 02/15/2020 1527 Last data filed at 02/15/2020 1000 Gross per 24 hour  Intake 2845.75 ml  Output 175 ml  Net 2670.75 ml    Physical Exam  Gen:-Intubated and sedated HEENT:-ET and OG tubes  Neck-right IJ central line  lungs-symmetrical air movement, no wheezing CV- S1, S2 normal, regular , CABG scar Abd-  +ve B.Sounds, Abd Soft, not distended,    Extremity/Skin:- No  edema, pedal pulses present  Neuro-Psych-Limited exam as patient is intubated and sedated    Data Review:   Micro Results Recent Results (from the past 240 hour(s))   Respiratory Panel by RT PCR (Flu A&B, Covid) - Nasopharyngeal Swab     Status: None   Collection Time: 02/13/20  4:51 PM   Specimen: Nasopharyngeal Swab  Result Value Ref Range Status   SARS Coronavirus 2 by RT PCR NEGATIVE NEGATIVE Final    Comment: (NOTE) SARS-CoV-2 target nucleic acids are NOT DETECTED.  The SARS-CoV-2 RNA is generally detectable in upper respiratoy specimens during the acute phase of infection. The lowest concentration of SARS-CoV-2 viral copies this assay can detect is 131 copies/mL. A negative result does not preclude SARS-Cov-2 infection and should not be used as the sole basis for treatment or other patient management decisions. A negative result may occur with  improper specimen collection/handling, submission of specimen other than nasopharyngeal swab, presence of viral mutation(s) within the areas targeted by this assay, and inadequate number of viral copies (<131 copies/mL). A negative result must be combined with clinical observations, patient history, and epidemiological information. The expected result is Negative.  Fact Sheet for Patients:  PinkCheek.be  Fact Sheet for Healthcare Providers:  GravelBags.it  This test is no t yet approved or cleared by the [place] FDA and  has been authorized for detection and/or diagnosis of SARS-CoV-2 by FDA under an Emergency Use Authorization (EUA). This EUA will remain  in effect (meaning this test can be used) for the duration of the COVID-19 declaration under Section 564(b)(1) of the Act, 21 U.S.C. section 360bbb-3(b)(1), unless the authorization is terminated or revoked sooner.     Influenza A by PCR NEGATIVE NEGATIVE Final   Influenza B by PCR NEGATIVE NEGATIVE Final    Comment: (NOTE) The Xpert Xpress SARS-CoV-2/FLU/RSV assay is intended as an aid in  the diagnosis of influenza from Nasopharyngeal swab specimens and  should not be used as  a sole basis for treatment. Nasal washings and  aspirates are unacceptable for Xpert Xpress SARS-CoV-2/FLU/RSV  testing.  Fact Sheet for Patients: PinkCheek.be  Fact Sheet for Healthcare Providers: GravelBags.it  This test is not yet approved or cleared by the [place] FDA and  has been authorized for detection and/or diagnosis of SARS-CoV-2 by  FDA under an Emergency Use Authorization (EUA). This EUA will remain  in effect (meaning this test can be used) for the duration of the  Covid-19 declaration under Section 564(b)(1) of the Act, 21  U.S.C. section 360bbb-3(b)(1), unless the authorization is  terminated or revoked. Performed at [facility], [address] [place], [place] [address]   Culture, blood (Routine x 2)     Status: None (Preliminary result)   Collection Time: 02/13/20  4:52 PM   Specimen: Right Antecubital; Blood  Result Value Ref Range Status   Specimen Description   Final    RIGHT ANTECUBITAL Performed at [facility], [address], [place], [place] [address]    Special Requests   Final    BOTTLES DRAWN AEROBIC AND ANAEROBIC Blood Culture adequate volume Performed at [facility], [address], [place], [place] [address]    Culture  Setup Time   Final    GRAM POSITIVE COCCI AEROBIC BOTTLE ONLY Organism ID to follow CRITICAL RESULT CALLED TO, READ BACK BY AND VERIFIED WITH: G. Coffee PharmD 9:35 02/15/20 ([email or web address])    Culture   Final    CULTURE REINCUBATED FOR BETTER GROWTH Performed at [facility], [place] [address], [place], [place] [address]    Report Status PENDING  Incomplete  Blood Culture ID Panel (Reflexed)     Status: Abnormal   Collection Time: 02/13/20  4:52 PM  Result Value Ref Range Status   Enterococcus faecalis NOT DETECTED NOT DETECTED Final   Enterococcus Faecium NOT DETECTED NOT DETECTED Final   Listeria monocytogenes NOT DETECTED NOT DETECTED Final   Staphylococcus  species DETECTED (A) NOT DETECTED Final    Comment: CRITICAL RESULT CALLED TO, READ BACK BY AND VERIFIED WITH: G. Coffee PharmD 9:35 02/15/20 ([email or web address])    Staphylococcus aureus (BCID) NOT DETECTED NOT DETECTED Final   Staphylococcus epidermidis DETECTED (A) NOT DETECTED Final    Comment: CRITICAL RESULT CALLED TO, READ BACK BY AND VERIFIED WITH: G. Coffee PharmD 9:35 02/15/20 ([email or web address])    Staphylococcus lugdunensis NOT DETECTED NOT DETECTED Final   Streptococcus species NOT DETECTED NOT DETECTED Final   Streptococcus agalactiae NOT DETECTED NOT DETECTED Final   Streptococcus pneumoniae NOT DETECTED NOT DETECTED Final   Streptococcus pyogenes NOT DETECTED NOT DETECTED Final   A.calcoaceticus-baumannii NOT DETECTED NOT DETECTED Final   Bacteroides fragilis NOT DETECTED NOT DETECTED Final   Enterobacterales NOT DETECTED NOT DETECTED Final   Enterobacter cloacae complex NOT DETECTED NOT DETECTED Final   Escherichia coli NOT DETECTED NOT DETECTED Final   Klebsiella aerogenes NOT DETECTED NOT DETECTED Final   Klebsiella oxytoca NOT DETECTED NOT DETECTED Final   Klebsiella pneumoniae NOT DETECTED NOT DETECTED Final   Proteus species NOT DETECTED NOT DETECTED Final   Salmonella species NOT DETECTED NOT DETECTED Final   Serratia marcescens NOT DETECTED NOT DETECTED Final   Haemophilus influenzae NOT DETECTED NOT DETECTED Final   Neisseria meningitidis NOT DETECTED NOT DETECTED Final   Pseudomonas aeruginosa NOT DETECTED NOT DETECTED Final   Stenotrophomonas maltophilia NOT DETECTED NOT DETECTED Final   Candida albicans NOT DETECTED NOT DETECTED Final   Candida auris NOT DETECTED NOT DETECTED Final   Candida glabrata NOT DETECTED NOT DETECTED Final   Candida krusei NOT DETECTED NOT DETECTED Final   Candida parapsilosis NOT DETECTED NOT DETECTED Final   Candida tropicalis NOT DETECTED NOT DETECTED Final   Cryptococcus neoformans/gattii NOT DETECTED NOT DETECTED Final   Methicillin resistance  mecA/C NOT DETECTED NOT DETECTED Final    Comment: Performed at [facility], [address] [place], [place] [address]  Culture, blood (Routine x 2)     Status: None (Preliminary result)   Collection Time: 02/13/20  5:44 PM   Specimen: Right Antecubital; Blood  Result Value Ref Range Status  Specimen Description RIGHT ANTECUBITAL  Final   Special Requests   Final    BOTTLES DRAWN AEROBIC AND ANAEROBIC Blood Culture adequate volume   Culture   Final    NO GROWTH 2 DAYS Performed at [facility], [address], [place], [place] [address]    Report Status PENDING  Incomplete  Culture, blood (routine x 2)     Status: None (Preliminary result)   Collection Time: 02/14/20  9:44 PM   Specimen: BLOOD RIGHT HAND  Result Value Ref Range Status   Specimen Description BLOOD RIGHT HAND  Final   Special Requests   Final    BOTTLES DRAWN AEROBIC ONLY Blood Culture adequate volume   Culture   Final    NO GROWTH < 12 HOURS Performed at [facility], [address], [place], [place] [address]    Report Status PENDING  Incomplete  Culture, blood (routine x 2)     Status: None (Preliminary result)   Collection Time: 02/14/20  9:58 PM   Specimen: BLOOD LEFT HAND  Result Value Ref Range Status   Specimen Description BLOOD LEFT HAND  Final   Special Requests   Final    BOTTLES DRAWN AEROBIC ONLY Blood Culture adequate volume   Culture   Final    NO GROWTH < 12 HOURS Performed at [facility], [address], [place], [place] [address]    Report Status PENDING  Incomplete  MRSA PCR Screening     Status: None   Collection Time: 02/15/20 12:05 AM   Specimen: Nasal Mucosa; Nasopharyngeal  Result Value Ref Range Status   MRSA by PCR NEGATIVE NEGATIVE Final    Comment:        The GeneXpert MRSA Assay (FDA approved for NASAL specimens only), is one component of a comprehensive MRSA colonization surveillance program. It is not intended to diagnose MRSA infection nor to guide or monitor  treatment for MRSA infections. Performed at [facility], [address], [place],  [address]     Radiology Reports CT Head Wo Contrast  Result Date: 02/13/2020 CLINICAL DATA:  Unresponsive EXAM: CT HEAD WITHOUT CONTRAST TECHNIQUE: Contiguous axial images were obtained from the base of the skull through the vertex without intravenous contrast. COMPARISON:  06/29/2019 FINDINGS: Brain: There again noted changes consistent with prior encephalomalacia from ischemic event in the distribution of the right middle cerebral artery involving the right temporal and parietal lobes. The overall appearance is stable. No findings to suggest acute hemorrhage, acute infarction or space-occupying mass lesion are noted. Mild atrophic changes are again seen. Vascular: No hyperdense vessel or unexpected calcification. Skull: Normal. Negative for fracture or focal lesion. Sinuses/Orbits: No acute finding. Other: None. 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 No acute abnormality noted. Electronically Signed   By: Inez Catalina M.D.   On: 02/13/2020 21:30   NM Pulmonary Perfusion  Result Date: 02/15/2020 CLINICAL DATA:  Respiratory failure EXAM: NUCLEAR MEDICINE PERFUSION LUNG SCAN TECHNIQUE: Perfusion images were obtained in multiple projections after intravenous injection of radiopharmaceutical. Ventilation scans intentionally deferred if perfusion scan and chest x-ray adequate for interpretation during COVID 19 epidemic. RADIOPHARMACEUTICALS:  4.4 mCi Tc-40mMAA IV COMPARISON:  Chest radiograph 02/15/2020 FINDINGS: Inhomogeneous tracer distribution in a nonsegmental pattern in both lungs, pattern favoring parenchymal lung disease. Tiny subsegmental defects at lung bases. Findings are not suspicious for pulmonary embolism. IMPRESSION: No scintigraphic evidence of pulmonary embolism. Electronically Signed   By: MCrist InfanteD.  On: 02/15/2020 12:40   US  Venous Img Lower Unilateral Left (DVT)  Result Date: 02/15/2020 CLINICAL DATA:  Left lower extremity edema. EXAM: LEFT LOWER EXTREMITY VENOUS DOPPLER ULTRASOUND TECHNIQUE: Gray-scale sonography with graded compression, as well as color Doppler and duplex ultrasound were performed to evaluate the lower extremity deep venous systems from the level of the common femoral vein and including the common femoral, femoral, profunda femoral, popliteal and calf veins including the posterior tibial, peroneal and gastrocnemius veins when visible. The superficial great saphenous vein was also interrogated. Spectral Doppler was utilized to evaluate flow at rest and with distal augmentation maneuvers in the common femoral, femoral and popliteal veins. COMPARISON:  None. FINDINGS: Contralateral Common Femoral Vein: Respiratory phasicity is normal and symmetric with the symptomatic side. No evidence of thrombus. Normal compressibility. Common Femoral Vein: No evidence of thrombus. Normal compressibility, respiratory phasicity and response to augmentation. Saphenofemoral Junction: No evidence of thrombus. Normal compressibility and flow on color Doppler imaging. Profunda Femoral Vein: No evidence of thrombus. Normal compressibility and flow on color Doppler imaging. Femoral Vein: No evidence of thrombus. Normal compressibility, respiratory phasicity and response to augmentation. Popliteal Vein: No evidence of thrombus. Normal compressibility, respiratory phasicity and response to augmentation. Calf Veins: No evidence of thrombus. Normal compressibility and flow on color Doppler imaging. Superficial Great Saphenous Vein: No evidence of thrombus. Normal compressibility. Venous Reflux:  None. Other Findings: No evidence of superficial thrombophlebitis or abnormal fluid collection. IMPRESSION: No evidence of left lower extremity deep venous thrombosis. Electronically Signed   By: Aletta Edouard M.D.   On: 02/15/2020 10:32   Portable  Chest xray  Result Date: 02/15/2020 CLINICAL DATA:  Acute respiratory failure. EXAM: PORTABLE CHEST 1 VIEW COMPARISON:  02/14/2020. FINDINGS: The ETT tip is stable above the carina. There is a nasogastric tube with tip and side port below the GE junction. Heart size appears normal. No pleural effusion or airspace consolidation. Pulmonary vascular congestion noted. No overt edema. IMPRESSION: Pulmonary vascular congestion. Electronically Signed   By: Kerby Moors M.D.   On: 02/15/2020 05:41   DG Chest Portable 1 View  Result Date: 02/14/2020 CLINICAL DATA:  Check gastrostomy catheter EXAM: PORTABLE CHEST 1 VIEW COMPARISON:  02/14/2020 FINDINGS: Gastric catheter is been withdrawn and now lies with the tip in the stomach. Proximal side port is noted at the gastroesophageal junction. This could be advanced a few cm deeper into the stomach. IMPRESSION: Gastric catheter as described. Electronically Signed   By: Inez Catalina M.D.   On: 02/14/2020 21:19   DG Chest Portable 1 View  Result Date: 02/14/2020 CLINICAL DATA:  Hypoxia check gastric catheter placement EXAM: PORTABLE CHEST 1 VIEW COMPARISON:  02/14/2020 FINDINGS: Endotracheal tube and gastric catheter are again seen. The gastric catheter has been advanced and is now coiled within the stomach extending into the proximal duodenum. The lungs are hyperinflated consistent with COPD. No bony abnormality is noted. IMPRESSION: Gastric catheter as described. COPD without acute abnormality. Electronically Signed   By: Inez Catalina M.D.   On: 02/14/2020 21:14   DG Chest Portable 1 View  Result Date: 02/14/2020 CLINICAL DATA:  Intubated, loss of consciousness, COPD EXAM: PORTABLE CHEST 1 VIEW COMPARISON:  02/14/2020 FINDINGS: 2 frontal views of the chest demonstrate endotracheal tube overlying tracheal air column, tip approximately 3.8 cm above carina. Enteric catheter tip and side port project over the distal thoracic esophagus. Cardiac silhouette is  unremarkable. Background emphysema again noted without airspace disease, effusion, or pneumothorax. No acute  bony abnormalities. IMPRESSION: 1. Enteric catheter tip and side port projecting over the distal thoracic esophagus. Recommend advancing at least 7 cm to ensure side port placement within the gastric lumen. 2. No complication after intubation. 3. Emphysema. Electronically Signed   By: Randa Ngo M.D.   On: 02/14/2020 18:40   DG Chest Portable 1 View  Result Date: 02/14/2020 CLINICAL DATA:  Shortness of breath, history of COPD EXAM: PORTABLE CHEST 1 VIEW COMPARISON:  02/13/2020 FINDINGS: Emphysema and chronic interstitial changes. No new consolidation or edema. No pleural effusion or pneumothorax. Stable cardiomediastinal contours. IMPRESSION: COPD without acute process in the chest. Electronically Signed   By: Macy Mis M.D.   On: 02/14/2020 17:32   DG Chest Portable 1 View  Result Date: 02/13/2020 CLINICAL DATA:  Hypotension. EXAM: PORTABLE CHEST 1 VIEW COMPARISON:  Prior chest radiographs 12/16/2019 and earlier. FINDINGS: Heart size within normal limits. Prior median sternotomy/CABG. Aortic atherosclerosis. Unchanged nonspecific chronic prominence of the interstitial lung markings. No appreciable airspace consolidation or frank pulmonary edema. No evidence of pleural effusion or pneumothorax. No acute bony abnormality identified. ACDF hardware within the lower cervical spine. IMPRESSION: No evidence of acute cardiopulmonary abnormality. Chronic nonspecific prominence of the interstitial lung markings, unchanged. Prior median sternotomy/CABG. Aortic Atherosclerosis (ICD10-I70.0). Electronically Signed   By: Kellie Simmering DO   On: 02/13/2020 18:15   CBC Recent Labs  Lab 02/13/20 1652 02/14/20 1723 02/15/20 0142  WBC 8.8 19.6* 19.4*  HGB 16.8 17.1* 16.6  HCT 55.7* 58.5* 54.8*  PLT 270 352 213  MCV 94.2 96.4 93.5  MCH 28.4 28.2 28.3  MCHC 30.2 29.2* 30.3  RDW 19.6* 19.1* 19.3*   LYMPHSABS 0.6* 0.8  --   MONOABS 0.8 1.8*  --   EOSABS 0.0 0.0  --   BASOSABS 0.1 0.0  --     Chemistries  Recent Labs  Lab 02/13/20 1652 02/14/20 1723 02/15/20 0142  NA 133* 135 136  K 4.8 4.2 4.4  CL 91* 91* 98  CO2 33* 32 25  GLUCOSE 118* 145* 143*  BUN 27* 40* 41*  CREATININE 1.59* 1.85* 1.47*  CALCIUM 10.6* 10.6* 9.8  AST 35 294*  --   ALT 23 292*  --   ALKPHOS 85 96  --   BILITOT 0.9 1.2  --     Recent Labs    02/14/20 2045  TRIG 82   Lab Results  Component Value Date   HGBA1C 6.2 (H) 06/01/2019   ------------------------------------------------------------------------------------------------------------------ No results for input(s): TSH, T4TOTAL, T3FREE, THYROIDAB in the last 72 hours.  Invalid input(s): FREET3 ------------------------------------------------------------------------------------------------------------------ No results for input(s): VITAMINB12, FOLATE, FERRITIN, TIBC, IRON, RETICCTPCT in the last 72 hours.  Coagulation profile Recent Labs  Lab 02/13/20 1652  INR 1.3*    No results for input(s): DDIMER in the last 72 hours.  Cardiac Enzymes No results for input(s): CKMB, TROPONINI, MYOGLOBIN in the last 168 hours.  Invalid input(s): CK ------------------------------------------------------------------------------------------------------------------    Component Value Date/Time   BNP 495.0 (H) 12/16/2019 1805    Roxan Hockey M.D on 02/15/2020 at 3:27 PM  Go to [email or web address] - for contact info  [facility] - Office  [phone]

## 2020-02-15 NOTE — Progress Notes (Signed)
RT transported patient to and from VQ scan, RN at bedside. No complications noted

## 2020-02-16 ENCOUNTER — Inpatient Hospital Stay (HOSPITAL_COMMUNITY): Payer: Medicare Other

## 2020-02-16 DIAGNOSIS — Z978 Presence of other specified devices: Secondary | ICD-10-CM | POA: Diagnosis not present

## 2020-02-16 DIAGNOSIS — I471 Supraventricular tachycardia: Secondary | ICD-10-CM

## 2020-02-16 DIAGNOSIS — A419 Sepsis, unspecified organism: Secondary | ICD-10-CM

## 2020-02-16 DIAGNOSIS — I2510 Atherosclerotic heart disease of native coronary artery without angina pectoris: Secondary | ICD-10-CM

## 2020-02-16 DIAGNOSIS — J441 Chronic obstructive pulmonary disease with (acute) exacerbation: Secondary | ICD-10-CM | POA: Diagnosis not present

## 2020-02-16 DIAGNOSIS — I25810 Atherosclerosis of coronary artery bypass graft(s) without angina pectoris: Secondary | ICD-10-CM | POA: Diagnosis not present

## 2020-02-16 DIAGNOSIS — J449 Chronic obstructive pulmonary disease, unspecified: Secondary | ICD-10-CM

## 2020-02-16 DIAGNOSIS — R7881 Bacteremia: Secondary | ICD-10-CM | POA: Diagnosis not present

## 2020-02-16 DIAGNOSIS — J9601 Acute respiratory failure with hypoxia: Secondary | ICD-10-CM | POA: Diagnosis not present

## 2020-02-16 LAB — COMPREHENSIVE METABOLIC PANEL
ALT: 744 U/L — ABNORMAL HIGH (ref 0–44)
AST: 490 U/L — ABNORMAL HIGH (ref 15–41)
Albumin: 2.6 g/dL — ABNORMAL LOW (ref 3.5–5.0)
Alkaline Phosphatase: 74 U/L (ref 38–126)
Anion gap: 8 (ref 5–15)
BUN: 40 mg/dL — ABNORMAL HIGH (ref 6–20)
CO2: 26 mmol/L (ref 22–32)
Calcium: 9.1 mg/dL (ref 8.9–10.3)
Chloride: 104 mmol/L (ref 98–111)
Creatinine, Ser: 1.06 mg/dL (ref 0.61–1.24)
GFR, Estimated: >60 mL/min (ref >60)
Glucose, Bld: 128 mg/dL — ABNORMAL HIGH (ref 70–99)
Potassium: 3.1 mmol/L — ABNORMAL LOW (ref 3.5–5.1)
Sodium: 138 mmol/L (ref 135–145)
Total Bilirubin: 0.9 mg/dL (ref 0.3–1.2)
Total Protein: 5.3 g/dL — ABNORMAL LOW (ref 6.5–8.1)

## 2020-02-16 LAB — CBC
HCT: 48.9 % (ref 39.0–52.0)
Hemoglobin: 15.9 g/dL (ref 13.0–17.0)
MCH: 28.1 pg (ref 26.0–34.0)
MCHC: 32.5 g/dL (ref 30.0–36.0)
MCV: 86.4 fL (ref 80.0–100.0)
Platelets: 226 10*3/uL (ref 150–400)
RBC: 5.66 MIL/uL (ref 4.22–5.81)
RDW: 19.6 % — ABNORMAL HIGH (ref 11.5–15.5)
WBC: 18.1 10*3/uL — ABNORMAL HIGH (ref 4.0–10.5)
nRBC: 0.1 % (ref 0.0–0.2)

## 2020-02-16 LAB — GLUCOSE, CAPILLARY
Glucose-Capillary: 130 mg/dL — ABNORMAL HIGH (ref 70–99)
Glucose-Capillary: 121 mg/dL — ABNORMAL HIGH (ref 70–99)
Glucose-Capillary: 122 mg/dL — ABNORMAL HIGH (ref 70–99)
Glucose-Capillary: 111 mg/dL — ABNORMAL HIGH (ref 70–99)

## 2020-02-16 LAB — CULTURE, BLOOD (ROUTINE X 2): Special Requests: ADEQUATE

## 2020-02-16 LAB — ECHOCARDIOGRAM COMPLETE
Area-P 1/2: 3.37 cm2
Height: 73 in
S' Lateral: 3.3 cm
Weight: 3044.11 oz

## 2020-02-16 LAB — MAGNESIUM: Magnesium: 1.9 mg/dL (ref 1.7–2.4)

## 2020-02-16 LAB — URINE CULTURE: Culture: NO GROWTH

## 2020-02-16 MED ORDER — METOPROLOL TARTRATE 5 MG/5ML IV SOLN
5.0000 mg | Freq: Once | INTRAVENOUS | Status: AC
  Administered 2020-02-16: 5 mg via INTRAVENOUS

## 2020-02-16 MED ORDER — METOPROLOL TARTRATE 5 MG/5ML IV SOLN
5.0000 mg | Freq: Four times a day (QID) | INTRAVENOUS | Status: DC
  Administered 2020-02-16 – 2020-02-17 (×3): 5 mg via INTRAVENOUS
  Filled 2020-02-16 (×3): qty 5

## 2020-02-16 MED ORDER — ASPIRIN 81 MG PO CHEW
81.0000 mg | CHEWABLE_TABLET | Freq: Every day | ORAL | Status: DC
  Administered 2020-02-16 – 2020-02-21 (×4): 81 mg
  Filled 2020-02-16 (×5): qty 1

## 2020-02-16 MED ORDER — HYDROCORTISONE NA SUCCINATE PF 100 MG IJ SOLR
50.0000 mg | Freq: Two times a day (BID) | INTRAMUSCULAR | Status: AC
  Administered 2020-02-16 – 2020-02-19 (×7): 50 mg via INTRAVENOUS
  Filled 2020-02-16 (×7): qty 2

## 2020-02-16 MED ORDER — POTASSIUM CHLORIDE 10 MEQ/100ML IV SOLN
10.0000 meq | INTRAVENOUS | Status: AC
  Administered 2020-02-16 (×4): 10 meq via INTRAVENOUS
  Filled 2020-02-16 (×4): qty 100

## 2020-02-16 MED ORDER — POTASSIUM CHLORIDE 20 MEQ/15ML (10%) PO SOLN
40.0000 meq | Freq: Two times a day (BID) | ORAL | Status: AC
  Administered 2020-02-16 (×2): 40 meq
  Filled 2020-02-16 (×2): qty 30

## 2020-02-16 MED ORDER — FUROSEMIDE 10 MG/ML IJ SOLN
20.0000 mg | Freq: Once | INTRAMUSCULAR | Status: AC
  Administered 2020-02-16: 20 mg via INTRAVENOUS
  Filled 2020-02-16: qty 2

## 2020-02-16 MED ORDER — METOPROLOL TARTRATE 5 MG/5ML IV SOLN
2.5000 mg | Freq: Four times a day (QID) | INTRAVENOUS | Status: DC
  Administered 2020-02-16 (×2): 2.5 mg via INTRAVENOUS
  Filled 2020-02-16 (×3): qty 5

## 2020-02-16 MED ORDER — METOPROLOL TARTRATE 5 MG/5ML IV SOLN
INTRAVENOUS | Status: AC
  Administered 2020-02-16: 5 mg via INTRAVENOUS
  Filled 2020-02-16: qty 5

## 2020-02-16 MED FILL — Medication: Qty: 1 | Status: AC

## 2020-02-16 NOTE — Consult Note (Addendum)
Cardiology Consult    Patient ID: Antonio Ramirez; [id number]; 05-26-1960   Admit date: 02/14/2020 Date of Consult: 02/16/2020  Primary Care Provider: [name], DO Primary Cardiologist: Kate Sable, MD (Inactive) --> Will need to switch to new MD  Patient Profile    Antonio Ramirez is a 59 y.o. male with past medical history of CAD (s/p CABG in 2006 but details unavailable), HLD, COPD, tobacco use and substance use who is being seen today for Antonio evaluation of arrhythmias at Antonio request of Dr. Denton Brick.   History of Present Illness    Antonio Ramirez was last examined by Antonio Cardiology service in 06/2019 during an admission for metabolic acidosis and encephalopathy in Antonio setting of opiate overdose. Cardiology was consulted given troponin elevation (peaking at 1566) and this was felt to be secondary to demand ischemia. Further cardiac testing was not pursued at that time in Antonio setting of his current medical state and medical noncompliance.   By review of Antonio chart, he presented to [facility] ED on 02/13/2020 via EMS for unresponsiveness and symptoms did improve with Narcan. Was discharged home but presented back on 02/14/2020 for recurrent AMS and Antonio Ramirez reported she was concerned he had overdosed on his pain medication. Was febrile to 100.9. He was given Narcan with transient improvement in his symptoms but continued to require multiple doses. ABG showed pH 7.275 and CO2 at 81.3, therefore he was intubated while in Antonio ED.   Initial labs showed WBC 19.6, Hgb 17.1, platelets 352, Na+ 135, K+ 4.2 and creatinine 1.85. AST 294 and ALT 292. Ammonia 66. UDS positive for opiates and THC. Lactic Acid 4.6. CXR with vascular congestion. VQ Scan negative for DVT. Initial EKG showed NSR, HR 90 with known RBBB.   He was admitted for sepsis and acute hypoxic respiratory failure in Antonio setting of opiate abuse and COPD. Blood cultures were positive for staph epidermidis and  antibiotic therapy was transitioned to Cefazolin on 11/3.  Cardiology was consulted on 11/3 for concerns of possible VT on telemetry. By review, he had brief bursts of a narrow complex tachycardia but this registers as "wide-complex" on telemetry due to his underlying RBBB. On ttelemetry, Antonio rhythm appears more regular and consistent with SVT but it is irregular by EKG and more consistent with atrial fibrillation with RVR by his most recent tracings.   SBP was in Antonio 80's to 90's overnight but BP was improved to 104/68 on most recent check. Repeat labs this AM show Na+ 138, K+ 3.1 and creatinine 1.06. AST further elevated to 490 and ALT 744. Mg 1.9.  Past Medical History:  Diagnosis Date  . CAD (coronary artery disease)   . COPD (chronic obstructive pulmonary disease) ([place])   . Opioid dependence ([place])   . Smoker     Past Surgical History:  Procedure Laterality Date  . cardiac bypass    . CARDIAC SURGERY    . COLON SURGERY       Home Medications:  Prior to Admission medications   Medication Sig Start Date End Date Taking? Authorizing Provider  albuterol (VENTOLIN HFA) 108 (90 Base) MCG/ACT inhaler Inhale 1-2 puffs into Antonio lungs every 6 (six) hours as needed for wheezing or shortness of breath. 06/03/19  Yes Johnson, Clanford L, MD  AMITIZA 24 MCG capsule Take 24 mcg by mouth 2 (two) times daily with a meal. 05/17/19  Yes [provider]  aspirin EC 81 MG tablet Take 1  tablet (81 mg total) by mouth daily. 06/03/19  Yes Johnson, Clanford L, MD  aspirin-acetaminophen-caffeine (EXCEDRIN MIGRAINE) [phone] MG tablet Take 2 tablets by mouth every 6 (six) hours as needed for headache.   Yes [provider]  atorvastatin (LIPITOR) 40 MG tablet Take 1 tablet (40 mg total) by mouth daily at 6 PM. 06/03/19  Yes Johnson, Clanford L, MD  calcitRIOL (ROCALTROL) 0.5 MCG capsule Take 0.5 mcg by mouth daily. 03/16/19  Yes [provider]  citalopram (CELEXA) 10 MG tablet Take 1  tablet (10 mg total) by mouth daily. 07/04/19  Yes Patrecia Pour, MD  clopidogrel (PLAVIX) 75 MG tablet Take 75 mg by mouth daily. 06/27/19  Yes [provider]  gabapentin (NEURONTIN) 600 MG tablet Take 600 mg by mouth at bedtime.  05/27/19  Yes [provider]  isosorbide mononitrate (IMDUR) 30 MG 24 hr tablet Take 30 mg by mouth daily.  01/22/17  Yes [provider]  omeprazole (PRILOSEC) 20 MG capsule Take 20 mg by mouth daily before breakfast. 30 min prior to breakfast. 04/13/19  Yes [provider]  oxyCODONE (ROXICODONE) 15 MG immediate release tablet Take 1 tablet (15 mg total) by mouth 3 (three) times daily as needed for pain. 07/04/19  Yes Patrecia Pour, MD  senna-docusate (SENOKOT-S) 8.6-50 MG tablet Take 2 tablets by mouth at bedtime as needed for mild constipation. 06/03/19  Yes Johnson, Clanford L, MD  SYMBICORT 160-4.5 MCG/ACT inhaler Inhale 2 puffs into Antonio lungs 2 (two) times daily. 04/29/19  Yes [provider]  XTAMPZA ER 36 MG C12A Take 1 capsule (36 mg total) by mouth every 12 (twelve) hours. 07/04/19  Yes Patrecia Pour, MD  polyethylene glycol (MIRALAX / GLYCOLAX) packet Take 17 g by mouth daily. Patient not taking: Reported on 02/13/2020 02/11/17   Margette Fast, MD    Inpatient Medications: Scheduled Meds: . aspirin EC  81 mg Oral Q breakfast  . chlorhexidine gluconate (MEDLINE KIT)  15 mL Mouth Rinse BID  . Chlorhexidine Gluconate Cloth  6 each Topical Daily  . clopidogrel  75 mg Oral Daily  . docusate  100 mg Oral BID  . enoxaparin (LOVENOX) injection  40 mg Subcutaneous Q24H  . hydrocortisone sodium succinate  100 mg Intravenous Q12H  . influenza vac split quadrivalent PF  0.5 mL Intramuscular Tomorrow-1000  . insulin aspart  0-5 Units Subcutaneous QHS  . insulin aspart  0-6 Units Subcutaneous TID WC  . mouth rinse  15 mL Mouth Rinse 10 times per day  . metoprolol tartrate  2.5 mg Intravenous Q6H  . pneumococcal 23 valent  vaccine  0.5 mL Intramuscular Tomorrow-1000  . potassium chloride  40 mEq Oral BID   Continuous Infusions: . sodium chloride    .  ceFAZolin (ANCEF) IV Stopped (02/16/20 0559)  . dextrose 5 % and 0.9% NaCl 100 mL/hr at 02/16/20 0902  . famotidine (PEPCID) IV 20 mg (02/16/20 0902)  . propofol (DIPRIVAN) infusion 35 mcg/kg/min (02/16/20 0902)   PRN Meds: Place/Maintain arterial line **AND** sodium chloride, acetaminophen **OR** acetaminophen, fentaNYL (SUBLIMAZE) injection, fentaNYL (SUBLIMAZE) injection, ipratropium-albuterol, ondansetron **OR** ondansetron (ZOFRAN) IV  Allergies:    Allergies  Allergen Reactions  . Opana [Oxymorphone Hcl] Hives    hiv    Social History:   Social History   Socioeconomic History  . Marital status: Divorced    Spouse name: Not on file  . Number of children: Not on file  . Years of education: Not  on file  . Highest education level: Not on file  Occupational History  . Not on file  Tobacco Use  . Smoking status: Current Every Day Smoker    Packs/day: 1.00    Types: Cigarettes  . Smokeless tobacco: Never Used  Substance and Sexual Activity  . Alcohol use: No  . Drug use: No  . Sexual activity: Not on file  Other Topics Concern  . Not on file  Social History Narrative  . Not on file   Social Determinants of Health   Financial Resource Strain:   . Difficulty of Paying Living Expenses: Not on file  Food Insecurity:   . Worried About Charity fundraiser in [place] Last Year: Not on file  . Ran Out of Food in [place] Last Year: Not on file  Transportation Needs:   . Lack of Transportation (Medical): Not on file  . Lack of Transportation (Non-Medical): Not on file  Physical Activity:   . Days of Exercise per Week: Not on file  . Minutes of Exercise per Session: Not on file  Stress:   . Feeling of Stress : Not on file  Social Connections:   . Frequency of Communication with Friends and Family: Not on file  . Frequency of Social Gatherings  with Friends and Family: Not on file  . Attends Religious Services: Not on file  . Active Member of Clubs or Organizations: Not on file  . Attends Archivist Meetings: Not on file  . Marital Status: Not on file  Intimate Partner Violence:   . Fear of Current or Ex-Partner: Not on file  . Emotionally Abused: Not on file  . Physically Abused: Not on file  . Sexually Abused: Not on file     Family History:    Family History  Problem Relation Age of Onset  . COPD Mother       Review of Systems    Unable to be obtained. Patient currently intubated and sedated.   Physical Exam/Data    Vitals:   02/16/20 0815 02/16/20 0830 02/16/20 0837 02/16/20 0900  BP: 96/68 104/68  93/64  Pulse: (!) 52 (!) 56  (!) 52  Resp: _0 Temp:      TempSrc:      SpO2: 100% 100%  100%  Weight:      Height:   6' 1" (1.854 m)     Intake/Output Summary (Last 24 hours) at 02/16/2020 0934 Last data filed at 02/16/2020 0902 Gross per 24 hour  Intake 4930.46 ml  Output 1450 ml  Net 3480.46 ml   Filed Weights   02/15/20 0114 02/15/20 0300 02/16/20 0343  Weight: 83.1 kg 81.9 kg 86.3 kg   Body mass index is 25.1 kg/m.   General: Caucasian male appearing in NAD Psych: Unable to be assessed. Sedated.  Neuro: Unable to be assessed. Sedated.  HEENT: Intubated.   Neck: Supple without bruits or JVD. Lungs:  Resp regular and unlabored, decreased breath sounds along bases bilaterally. Heart: RRR no s3, s4, or murmurs. Abdomen: Soft, non-tender, non-distended, BS + x 4.  Extremities: No clubbing or cyanosis. 1+ pitting edema bilaterally. DP/PT/Radials 2+ and equal bilaterally.   EKG:  Antonio EKG was personally reviewed and demonstrates: Atrial fibrillation with RVR, HR 142 and known RBBB.   Telemetry: As above.    Labs/Studies     Relevant CV Studies:  Echocardiogram: 05/2019 IMPRESSIONS    1. Left ventricular ejection fraction, by estimation, is 55  to 60%. Antonio  left  ventricle has normal function. Antonio left ventricle demonstrates  regional wall motion abnormalities (see scoring diagram/findings for  description). There is mild asymmetric left  ventricular hypertrophy of Antonio posterior segment. Left ventricular  diastolic parameters were normal. There is septal dysnergy due to  conduction delay and Antonio interventricular septum is flattened in systole,  consistent with right ventricular pressure  overload.  2. Right ventricular systolic function is moderately reduced. Antonio right  ventricular size is moderately enlarged. There is normal pulmonary artery  systolic pressure.  3. Antonio mitral valve is grossly normal. No evidence of mitral valve  regurgitation.  4. Antonio aortic valve is tricuspid. Aortic valve regurgitation is not  visualized. No aortic stenosis is present.  5. Antonio inferior vena cava is dilated in size with >50% respiratory  variability, suggesting right atrial pressure of 8 mmHg.   Laboratory Data:  Chemistry Recent Labs  Lab 02/14/20 1723 02/15/20 0142 02/16/20 0804  NA 135 136 138  K 4.2 4.4 3.1*  CL 91* 98 104  CO2 32 25 26  GLUCOSE 145* 143* 128*  BUN 40* 41* 40*  CREATININE 1.85* 1.47* 1.06  CALCIUM 10.6* 9.8 9.1  GFRNONAA 41* 54* >60  ANIONGAP _0 Recent Labs  Lab 02/13/20 1652 02/14/20 1723 02/16/20 0804  PROT 6.9 7.0 5.3*  ALBUMIN 3.5 3.5 2.6*  AST 35 294* 490*  ALT 23 292* 744*  ALKPHOS 85 96 74  BILITOT 0.9 1.2 0.9   Hematology Recent Labs  Lab 02/13/20 1652 02/14/20 1723 02/15/20 0142  WBC 8.8 19.6* 19.4*  RBC 5.91* 6.07* 5.86*  HGB 16.8 17.1* 16.6  HCT 55.7* 58.5* 54.8*  MCV 94.2 96.4 93.5  MCH 28.4 28.2 28.3  MCHC 30.2 29.2* 30.3  RDW 19.6* 19.1* 19.3*  PLT 270 352 213   Cardiac EnzymesNo results for input(s): TROPONINI in Antonio last 168 hours. No results for input(s): TROPIPOC in Antonio last 168 hours.  BNPNo results for input(s): BNP, PROBNP in Antonio last 168 hours.  DDimer No results for  input(s): DDIMER in Antonio last 168 hours.  Radiology/Studies:  CT Head Wo Contrast  Result Date: 02/13/2020 CLINICAL DATA:  Unresponsive EXAM: CT HEAD WITHOUT CONTRAST TECHNIQUE: Contiguous axial images were obtained from Antonio base of Antonio skull through Antonio vertex without intravenous contrast. COMPARISON:  06/29/2019 FINDINGS: Brain: There again noted changes consistent with prior encephalomalacia from ischemic event in Antonio distribution of Antonio right middle cerebral artery involving Antonio right temporal and parietal lobes. Antonio overall appearance is stable. No findings to suggest acute hemorrhage, acute infarction or space-occupying mass lesion are noted. Mild atrophic changes are again seen. Vascular: No hyperdense vessel or unexpected calcification. Skull: Normal. Negative for fracture or focal lesion. Sinuses/Orbits: No acute finding. Other: None. IMPRESSION: Chronic changes of encephalomalacia in Antonio distribution of Antonio right middle cerebral artery. No acute abnormality noted. Electronically Signed   By: Inez Catalina M.D.   On: 02/13/2020 21:30   NM Pulmonary Perfusion  Result Date: 02/15/2020 CLINICAL DATA:  Respiratory failure EXAM: NUCLEAR MEDICINE PERFUSION LUNG SCAN TECHNIQUE: Perfusion images were obtained in multiple projections after intravenous injection of radiopharmaceutical. Ventilation scans intentionally deferred if perfusion scan and chest x-ray adequate for interpretation during COVID 19 epidemic. RADIOPHARMACEUTICALS:  4.4 mCi Tc-99mMAA IV COMPARISON:  Chest radiograph 02/15/2020 FINDINGS: Inhomogeneous tracer distribution in a nonsegmental pattern in both lungs, pattern favoring parenchymal lung disease. Tiny subsegmental defects at lung bases. Findings are not  suspicious for pulmonary embolism. IMPRESSION: No scintigraphic evidence of pulmonary embolism. Electronically Signed   By: Lavonia Dana M.D.   On: 02/15/2020 12:40   US Venous Img Lower Unilateral Left (DVT)  Result Date:  02/15/2020 CLINICAL DATA:  Left lower extremity edema. EXAM: LEFT LOWER EXTREMITY VENOUS DOPPLER ULTRASOUND TECHNIQUE: Gray-scale sonography with graded compression, as well as color Doppler and duplex ultrasound were performed to evaluate Antonio lower extremity deep venous systems from Antonio level of Antonio common femoral vein and including Antonio common femoral, femoral, profunda femoral, popliteal and calf veins including Antonio posterior tibial, peroneal and gastrocnemius veins when visible. Antonio superficial great saphenous vein was also interrogated. Spectral Doppler was utilized to evaluate flow at rest and with distal augmentation maneuvers in Antonio common femoral, femoral and popliteal veins. COMPARISON:  None. FINDINGS: Contralateral Common Femoral Vein: Respiratory phasicity is normal and symmetric with Antonio symptomatic side. No evidence of thrombus. Normal compressibility. Common Femoral Vein: No evidence of thrombus. Normal compressibility, respiratory phasicity and response to augmentation. Saphenofemoral Junction: No evidence of thrombus. Normal compressibility and flow on color Doppler imaging. Profunda Femoral Vein: No evidence of thrombus. Normal compressibility and flow on color Doppler imaging. Femoral Vein: No evidence of thrombus. Normal compressibility, respiratory phasicity and response to augmentation. Popliteal Vein: No evidence of thrombus. Normal compressibility, respiratory phasicity and response to augmentation. Calf Veins: No evidence of thrombus. Normal compressibility and flow on color Doppler imaging. Superficial Great Saphenous Vein: No evidence of thrombus. Normal compressibility. Venous Reflux:  None. Other Findings: No evidence of superficial thrombophlebitis or abnormal fluid collection. IMPRESSION: No evidence of left lower extremity deep venous thrombosis. Electronically Signed   By: Aletta Edouard M.D.   On: 02/15/2020 10:32   Portable Chest xray  Result Date: 02/15/2020 CLINICAL DATA:   Acute respiratory failure. EXAM: PORTABLE CHEST 1 VIEW COMPARISON:  02/14/2020. FINDINGS: Antonio ETT tip is stable above Antonio carina. There is a nasogastric tube with tip and side port below Antonio GE junction. Heart size appears normal. No pleural effusion or airspace consolidation. Pulmonary vascular congestion noted. No overt edema. IMPRESSION: Pulmonary vascular congestion. Electronically Signed   By: Kerby Moors M.D.   On: 02/15/2020 05:41   DG Chest Portable 1 View  Result Date: 02/14/2020 CLINICAL DATA:  Check gastrostomy catheter EXAM: PORTABLE CHEST 1 VIEW COMPARISON:  02/14/2020 FINDINGS: Gastric catheter is been withdrawn and now lies with Antonio tip in Antonio stomach. Proximal side port is noted at Antonio gastroesophageal junction. This could be advanced a few cm deeper into Antonio stomach. IMPRESSION: Gastric catheter as described. Electronically Signed   By: Inez Catalina M.D.   On: 02/14/2020 21:19   DG Chest Portable 1 View  Result Date: 02/14/2020 CLINICAL DATA:  Hypoxia check gastric catheter placement EXAM: PORTABLE CHEST 1 VIEW COMPARISON:  02/14/2020 FINDINGS: Endotracheal tube and gastric catheter are again seen. Antonio gastric catheter has been advanced and is now coiled within Antonio stomach extending into Antonio proximal duodenum. Antonio lungs are hyperinflated consistent with COPD. No bony abnormality is noted. IMPRESSION: Gastric catheter as described. COPD without acute abnormality. Electronically Signed   By: Inez Catalina M.D.   On: 02/14/2020 21:14   DG Chest Portable 1 View  Result Date: 02/14/2020 CLINICAL DATA:  Intubated, loss of consciousness, COPD EXAM: PORTABLE CHEST 1 VIEW COMPARISON:  02/14/2020 FINDINGS: 2 frontal views of Antonio chest demonstrate endotracheal tube overlying tracheal air column, tip approximately 3.8 cm above carina. Enteric catheter tip and side port project  over Antonio distal thoracic esophagus. Cardiac silhouette is unremarkable. Background emphysema again noted without airspace  disease, effusion, or pneumothorax. No acute bony abnormalities. IMPRESSION: 1. Enteric catheter tip and side port projecting over Antonio distal thoracic esophagus. Recommend advancing at least 7 cm to ensure side port placement within Antonio gastric lumen. 2. No complication after intubation. 3. Emphysema. Electronically Signed   By: Randa Ngo M.D.   On: 02/14/2020 18:40   DG Chest Portable 1 View  Result Date: 02/14/2020 CLINICAL DATA:  Shortness of breath, history of COPD EXAM: PORTABLE CHEST 1 VIEW COMPARISON:  02/13/2020 FINDINGS: Emphysema and chronic interstitial changes. No new consolidation or edema. No pleural effusion or pneumothorax. Stable cardiomediastinal contours. IMPRESSION: COPD without acute process in Antonio chest. Electronically Signed   By: Macy Mis M.D.   On: 02/14/2020 17:32   DG Chest Portable 1 View  Result Date: 02/13/2020 CLINICAL DATA:  Hypotension. EXAM: PORTABLE CHEST 1 VIEW COMPARISON:  Prior chest radiographs 12/16/2019 and earlier. FINDINGS: Heart size within normal limits. Prior median sternotomy/CABG. Aortic atherosclerosis. Unchanged nonspecific chronic prominence of Antonio interstitial lung markings. No appreciable airspace consolidation or frank pulmonary edema. No evidence of pleural effusion or pneumothorax. No acute bony abnormality identified. ACDF hardware within Antonio lower cervical spine. IMPRESSION: No evidence of acute cardiopulmonary abnormality. Chronic nonspecific prominence of Antonio interstitial lung markings, unchanged. Prior median sternotomy/CABG. Aortic Atherosclerosis (ICD10-I70.0). Electronically Signed   By: Kellie Simmering DO   On: 02/13/2020 18:15     Assessment & Plan    1. Narrow-Complex Tachycardia - Most episodes of his arrhythmia by review of telemetry appear most consistent with SVT as they are regular but EKG tracing from this morning was irregular and more consistent with atrial fibrillation. No evidence of VT.  - Currently admitted for  sepsis and acute hypoxic respiratory failure, so suspect these are Antonio driving cause for his arrhythmia. Will initiate IV Lopressor 2.27m Q6H with hold parameters given his soft BP. If BP does not allow for Antonio use of BB therapy, may need to use Amiodarone in Antonio short-term. K+ was low at 3.1 and will order replacement. Keep K+ ~ 4.0 and Mg ~ 2.0. Repeat echocardiogram is pending.  - This patients CHA2DS2-VASc Score and unadjusted Ischemic Stroke Rate (% per year) is equal to 0.6 % stroke rate/year from a score of 1 (Vascular). Not an ideal candidate for anticoagulation in Antonio setting of medication noncompliance and due to his arrhythmia occurring in Antonio setting of his acute illness.   2. CAD - He is s/p CABG in 2006 but details unavailable. Currently intubated and sedated so unable to obtain ROS but no reports of chest pain at Antonio time of admission by review of notes. Continue ASA and Plavix. Statin held given elevated LFT's.   3. Volume Overload - Received IVF on admission in Antonio setting of sepsis and currently receiving IVF at 100 mL/hr due to NPO status. Would recommend reducing Antonio dose given edema on examination. He did receive 458mof IV Lasix yesterday with a recorded output of -1.4L but overall positive +4.4 L this admission.   4. Elevated LFT's - AST 294 and ALT 292 on admission, at 490 and ALT 744 today. Possibly due to hepatic congestion or shock liver. Given his volume overload, would recommend reducing IVF and continuing with IV Lasix.    5. Acute Hypoxic Respiratory Failure in Antonio setting of COPD and Possible Opiate Overdose - Remains intubated and sedated. Pulmonology following.  6. Sepsis/Bacteremia - Remains on Cefazolin. WBC 19.4 on 11/3 with repeat labs pending for today.    For questions or updates, please contact [place] Please consult [email or web address] for contact info under Cardiology/STEMI.  Signed, Erma Heritage, PA-C 02/16/2020, 9:34 AM Pager:  [phone]  Attending note Patient seen and discussed with PA Ahmed Prima, I agree with her documentation. 59 yo male history of CAD with prior CABG, COPD, polysubstance abuse admitted with SIRS possible sepsis and respiratory failiure due to severe primarily hypercapnic failure. On vent being managed by pulmonary. Cardiology consulted this AM for epsidoes of narrow complex tachycardia in this setting.   Admit labs  K 4.8 Cr 1.59 BUN 27 Lactic acid 2.1 WBC 8.8 Hgb 16.8 Plt 270  COVID neg UDS + opiates, THC 7.1/118/127/24  CXR no acute process Admission EKG SR, RBBB VQ no PE   Telemetry shows sudden onset of tachycardia with his native RBBB. At times regular suggesting SVT, at times irregular suggesting possibly some short episodes of afib. Arrythmias in setting of SIRs/possible sepsis and respiraotry failure, as well as K 3.1 Mg 1.9, and high dose IV steroids and bronchodilators  Keep K at 4, Mg at 2. Started on IV lopressor 2.61m bid, if bp's too soft or persistent tachycarrhythmia would would use amiodarone short term. Not enough clear afib to commit to anticoag at this time. Drive for arrhythmia should lessen as systemic disease improves.   JCarlyle DollyMD

## 2020-02-16 NOTE — Progress Notes (Addendum)
Patient is having frequent runs of PVCs with HR going as high as 190. Patient is able to break it on his own and will go back to NSR with HR in the 60-70s. Dr. Denton Brick made aware. Consult to Cardiology is in. EKG obtained. Labs obtained. ECHO ordered.

## 2020-02-16 NOTE — Progress Notes (Signed)
Patient Demographics:    Antonio Ramirez, is a 59 y.o. male, DOB - September 13, 1960, EXB:[id number]  Admit date - 02/14/2020   Admitting Physician Ejiroghene Arlyce Dice, MD  Outpatient Primary MD for the patient is [name], DO  LOS - 2   Chief Complaint  Patient presents with  . Altered Mental Status        Subjective:    Antonio Ramirez today remains intubated and sedated- -patient's ex-wife and POA at bedside  --Episodes of tachyarrhythmia alternating between SVTs and A. fib with RVR with heart rate in the 150s to 170s--while sedated, will give metoprolol IV with  improvement and so proceeded to attempt sedation vacation and weaning protocol -however patient became more tachycardic--- -patient is back on sedation -  Assessment  & Plan :    Active Problems:   Acute respiratory failure with hypoxia (HCC)   Endotracheally intubated   Opioid dependence ([place])   COPD with acute exacerbation (HCC)   Depression   CAD (coronary artery disease)  Brief Summary:- a58 y.o.malewith a history of COPD, ongoing tobacco use, CAD s/p CABG, carotid artery disease,  chronic back pain with opioid dependence, and opioid overdose as well as chronic depression admitted on 02/14/2020 with acute hypoxic respiratory failure requiring intubation and mechanical ventilation- -Patient had a similar presentation in March 2021 when he was intubated for altered mentation subsequently did okay was extubated  A/p  1)Acute hypoxic and hypercapnic respiratory failure--- initially thought to be from a combination of opiate abuse and COPD--remains intubated and sedated, pulmonary consult from Dr. Melvyn Novas appreciated -Minimal response to Narcan -Continue ventilator support -ABG improved--specifically respiratory acidosis has resolved -VQ scan negative for PE -Lower extremity venous Dopplers without acute DVT --Chest x-ray on  02/15/2020 and 02/16/20 with possible pulm venous congestion--repeated Lasix -Patient did not tolerate sedation vacation and weaning well--please see subjective section above  2)SIRS-- meets SIRS criteria, sepsis Not Confirmed at this time as source of infection yet to be identified ---There is suspicion for possible sepsis with preliminary blood cultures from 02/13/2020 with staph epi-contaminant Versus infection -Continue IV Ancef, repeat blood cultures from 02/14/2020 and 02/16/2020--- NGTD  3)Staph Epi bacteremia and possible severe sepsis--- please see #2 above --Episodes of hemodynamic instability persist with erratic blood pressures --Transthoracic echocardiogram without evidence for endocarditis, --WBC 19.4>>18.1--patient is on steroids -Fevers resolved  4) acute COPD exacerbation--- bronchodilators and steroids as ordered, Dr Melvyn Novas switched patient to hydrocortisone from Solu-Medrol due to hemodynamic instability  5)CAD---s/p CABG 2006 and PVD--- -c/n aspirin and Plavix -Echo with EF of 40 to 45% which is down from 55 to 60% back in February 2021  - 6)AKI----acute kidney injury on CKD stage -#B --   creatinine is down to 1.0 (peak was 1.85)  ,  baseline creatinine = 1.5   renally adjust medications, avoid nephrotoxic agents / dehydration  / hypotension -AKI resolved with hydration  7)Social/Ethics--plan of care and advanced directive discussed with patient ex-wife who is also his POA--patient is a full code with full scope of treatment  8)FEN--patient is n.p.o., continue dextrose solution, check CBGs and adjust sliding scale  9) tachyarrhythmia--- patient alternating between SVT and A. fib with RVR--neurology consult appreciated, need to replace potassium, echo with reduced EF, -  Metoprolol as ordered  CRITICAL CARE Performed by: Roxan Hockey   Total critical care time: 42 minutes  Critical care time was exclusive of separately billable procedures and treating other  patients.  Critical care was necessary to treat or prevent imminent or life-threatening deterioration. Vent Settings- -PRVC/40/5/23/640  Critical care was time spent personally by me on the following activities: development of treatment plan with patient and/or surrogate as well as nursing, discussions with consultants, evaluation of patient's response to treatment, examination of patient, obtaining history from patient or surrogate, ordering and performing treatments and interventions, ordering and review of laboratory studies, ordering and review of radiographic studies, pulse oximetry and re-evaluation of patient's condition.   Disposition/Need for in-Hospital Stay- patient unable to be discharged at this time due to --remains intubated, sedated, requiring IV antibiotics pending repeat cultures, requiring IV fluids and hydrocortisone for pressor support  Status is: Inpatient  Remains inpatient appropriate because:remains intubated, sedated, requiring IV antibiotics pending repeat cultures, requiring IV fluids and hydrocortisone for pressor support   Disposition: The patient is from: Home              Anticipated d/c is to: Home              Anticipated d/c date is: > 3 days              Patient currently is not medically stable to d/c. Barriers: Not Clinically Stable- remains intubated, sedated, requiring IV antibiotics pending repeat cultures, requiring IV fluids and hydrocortisone for pressor support   Code Status : full  Family Communication:   Discussed with patient's ex- wife/POA  Consults  :  PCCM  DVT Prophylaxis  :  Lovenox -  - SCDs    Lab Results  Component Value Date   PLT 226 02/16/2020    Inpatient Medications  Scheduled Meds: . aspirin  81 mg Per Tube Daily  . chlorhexidine gluconate (MEDLINE KIT)  15 mL Mouth Rinse BID  . Chlorhexidine Gluconate Cloth  6 each Topical Daily  . clopidogrel  75 mg Oral Daily  . docusate  100 mg Oral BID  . enoxaparin  (LOVENOX) injection  40 mg Subcutaneous Q24H  . furosemide  20 mg Intravenous Once  . hydrocortisone sodium succinate  50 mg Intravenous Q12H  . influenza vac split quadrivalent PF  0.5 mL Intramuscular Tomorrow-1000  . insulin aspart  0-5 Units Subcutaneous QHS  . insulin aspart  0-6 Units Subcutaneous TID WC  . mouth rinse  15 mL Mouth Rinse 10 times per day  . metoprolol tartrate  2.5 mg Intravenous Q6H  . pneumococcal 23 valent vaccine  0.5 mL Intramuscular Tomorrow-1000  . potassium chloride  40 mEq Per Tube BID   Continuous Infusions: . sodium chloride    .  ceFAZolin (ANCEF) IV 2 g (02/16/20 1248)  . dextrose 5 % and 0.9% NaCl 50 mL/hr at 02/16/20 1250  . famotidine (PEPCID) IV 20 mg (02/16/20 0902)  . potassium chloride    . propofol (DIPRIVAN) infusion 35 mcg/kg/min (02/16/20 0944)   PRN Meds:.Place/Maintain arterial line **AND** sodium chloride, acetaminophen **OR** acetaminophen, fentaNYL (SUBLIMAZE) injection, fentaNYL (SUBLIMAZE) injection, ipratropium-albuterol, ondansetron **OR** ondansetron (ZOFRAN) IV    Anti-infectives (From admission, onward)   Start     Dose/Rate Route Frequency Ordered Stop   02/15/20 2200  vancomycin (VANCOCIN) IVPB 1000 mg/200 mL premix  Status:  Discontinued        1,000 mg 200 mL/hr over 60 Minutes Intravenous Every 24 hours  02/14/20 2149 02/15/20 1044   02/15/20 1130  ceFAZolin (ANCEF) IVPB 2g/100 mL premix        2 g 200 mL/hr over 30 Minutes Intravenous Every 8 hours 02/15/20 1044     02/14/20 2200  metroNIDAZOLE (FLAGYL) IVPB 500 mg  Status:  Discontinued        500 mg 100 mL/hr over 60 Minutes Intravenous Every 8 hours 02/14/20 2136 02/15/20 1433   02/14/20 2200  vancomycin (VANCOREADY) IVPB 1250 mg/250 mL        1,250 mg 166.7 mL/hr over 90 Minutes Intravenous  Once 02/14/20 2148 02/15/20 0128   02/14/20 2200  ceFEPIme (MAXIPIME) 2 g in sodium chloride 0.9 % 100 mL IVPB  Status:  Discontinued        2 g 200 mL/hr over 30 Minutes  Intravenous Every 12 hours 02/14/20 2148 02/15/20 1044        Objective:   Vitals:   02/16/20 0900 02/16/20 1200 02/16/20 1430 02/16/20 1445  BP: 93/64  133/82 123/76  Pulse: (!) 52  73 82  Resp: 16  18 (!) 24  Temp:  98.8 F (37.1 C)    TempSrc:  Oral    SpO2: 100%  100% 100%  Weight:      Height:        Wt Readings from Last 3 Encounters:  02/16/20 86.3 kg  02/13/20 70.3 kg  12/16/19 80.3 kg     Intake/Output Summary (Last 24 hours) at 02/16/2020 1628 Last data filed at 02/16/2020 1449 Gross per 24 hour  Intake 2184.74 ml  Output 1800 ml  Net 384.74 ml    Physical Exam  Gen:-Intubated and sedated HEENT:-ET and OG tubes  Neck-right IJ central line  lungs-symmetrical air movement, no wheezing CV- S1, S2 normal, regular , CABG scar Abd-  +ve B.Sounds, Abd Soft, not distended,    Extremity/Skin:- No  edema, pedal pulses present  Neuro-Psych-Limited exam as patient is intubated and sedated    Data Review:   Micro Results Recent Results (from the past 240 hour(s))  Respiratory Panel by RT PCR (Flu A&B, Covid) - Nasopharyngeal Swab     Status: None   Collection Time: 02/13/20  4:51 PM   Specimen: Nasopharyngeal Swab  Result Value Ref Range Status   SARS Coronavirus 2 by RT PCR NEGATIVE NEGATIVE Final    Comment: (NOTE) SARS-CoV-2 target nucleic acids are NOT DETECTED.  The SARS-CoV-2 RNA is generally detectable in upper respiratoy specimens during the acute phase of infection. The lowest concentration of SARS-CoV-2 viral copies this assay can detect is 131 copies/mL. A negative result does not preclude SARS-Cov-2 infection and should not be used as the sole basis for treatment or other patient management decisions. A negative result may occur with  improper specimen collection/handling, submission of specimen other than nasopharyngeal swab, presence of viral mutation(s) within the areas targeted by this assay, and inadequate number of viral copies (<131  copies/mL). A negative result must be combined with clinical observations, patient history, and epidemiological information. The expected result is Negative.  Fact Sheet for Patients:  PinkCheek.be  Fact Sheet for Healthcare Providers:  GravelBags.it  This test is no t yet approved or cleared by the [place] FDA and  has been authorized for detection and/or diagnosis of SARS-CoV-2 by FDA under an Emergency Use Authorization (EUA). This EUA will remain  in effect (meaning this test can be used) for the duration of the COVID-19 declaration under Section 564(b)(1) of the Act, 21  U.S.C. section 360bbb-3(b)(1), unless the authorization is terminated or revoked sooner.     Influenza A by PCR NEGATIVE NEGATIVE Final   Influenza B by PCR NEGATIVE NEGATIVE Final    Comment: (NOTE) The Xpert Xpress SARS-CoV-2/FLU/RSV assay is intended as an aid in  the diagnosis of influenza from Nasopharyngeal swab specimens and  should not be used as a sole basis for treatment. Nasal washings and  aspirates are unacceptable for Xpert Xpress SARS-CoV-2/FLU/RSV  testing.  Fact Sheet for Patients: PinkCheek.be  Fact Sheet for Healthcare Providers: GravelBags.it  This test is not yet approved or cleared by the [place] FDA and  has been authorized for detection and/or diagnosis of SARS-CoV-2 by  FDA under an Emergency Use Authorization (EUA). This EUA will remain  in effect (meaning this test can be used) for the duration of the  Covid-19 declaration under Section 564(b)(1) of the Act, 21  U.S.C. section 360bbb-3(b)(1), unless the authorization is  terminated or revoked. Performed at [facility], [address], [place], [place] [address]   Culture, blood (Routine x 2)     Status: Abnormal   Collection Time: 02/13/20  4:52 PM   Specimen: Right Antecubital; Blood  Result Value  Ref Range Status   Specimen Description   Final    RIGHT ANTECUBITAL Performed at [facility], [address] [place], [place] [address]    Special Requests   Final    BOTTLES DRAWN AEROBIC AND ANAEROBIC Blood Culture adequate volume Performed at [facility], [address], [place], [place] [address]    Culture  Setup Time   Final    GRAM POSITIVE COCCI AEROBIC BOTTLE ONLY CRITICAL RESULT CALLED TO, READ BACK BY AND VERIFIED WITH: G. Coffee PharmD 9:35 02/15/20 ([email or web address])    Culture (A)  Final    STAPHYLOCOCCUS EPIDERMIDIS THE SIGNIFICANCE OF ISOLATING THIS ORGANISM FROM A SINGLE SET OF BLOOD CULTURES WHEN MULTIPLE SETS ARE DRAWN IS UNCERTAIN. PLEASE NOTIFY THE MICROBIOLOGY DEPARTMENT WITHIN ONE WEEK IF SPECIATION AND SENSITIVITIES ARE REQUIRED. Performed at [facility], [place] [address] [place], [place] [address]    Report Status 02/16/2020 FINAL  Final  Blood Culture ID Panel (Reflexed)     Status: Abnormal   Collection Time: 02/13/20  4:52 PM  Result Value Ref Range Status   Enterococcus faecalis NOT DETECTED NOT DETECTED Final   Enterococcus Faecium NOT DETECTED NOT DETECTED Final   Listeria monocytogenes NOT DETECTED NOT DETECTED Final   Staphylococcus species DETECTED (A) NOT DETECTED Final    Comment: CRITICAL RESULT CALLED TO, READ BACK BY AND VERIFIED WITH: G. Coffee PharmD 9:35 02/15/20 ([email or web address])    Staphylococcus aureus (BCID) NOT DETECTED NOT DETECTED Final   Staphylococcus epidermidis DETECTED (A) NOT DETECTED Final    Comment: CRITICAL RESULT CALLED TO, READ BACK BY AND VERIFIED WITH: G. Coffee PharmD 9:35 02/15/20 ([email or web address])    Staphylococcus lugdunensis NOT DETECTED NOT DETECTED Final   Streptococcus species NOT DETECTED NOT DETECTED Final   Streptococcus agalactiae NOT DETECTED NOT DETECTED Final   Streptococcus pneumoniae NOT DETECTED NOT DETECTED Final   Streptococcus pyogenes NOT DETECTED NOT DETECTED Final   A.calcoaceticus-baumannii NOT DETECTED NOT  DETECTED Final   Bacteroides fragilis NOT DETECTED NOT DETECTED Final   Enterobacterales NOT DETECTED NOT DETECTED Final   Enterobacter cloacae complex NOT DETECTED NOT DETECTED Final   Escherichia coli NOT DETECTED NOT DETECTED Final   Klebsiella aerogenes NOT DETECTED NOT DETECTED Final   Klebsiella oxytoca NOT DETECTED NOT DETECTED  Final   Klebsiella pneumoniae NOT DETECTED NOT DETECTED Final   Proteus species NOT DETECTED NOT DETECTED Final   Salmonella species NOT DETECTED NOT DETECTED Final   Serratia marcescens NOT DETECTED NOT DETECTED Final   Haemophilus influenzae NOT DETECTED NOT DETECTED Final   Neisseria meningitidis NOT DETECTED NOT DETECTED Final   Pseudomonas aeruginosa NOT DETECTED NOT DETECTED Final   Stenotrophomonas maltophilia NOT DETECTED NOT DETECTED Final   Candida albicans NOT DETECTED NOT DETECTED Final   Candida auris NOT DETECTED NOT DETECTED Final   Candida glabrata NOT DETECTED NOT DETECTED Final   Candida krusei NOT DETECTED NOT DETECTED Final   Candida parapsilosis NOT DETECTED NOT DETECTED Final   Candida tropicalis NOT DETECTED NOT DETECTED Final   Cryptococcus neoformans/gattii NOT DETECTED NOT DETECTED Final   Methicillin resistance mecA/C NOT DETECTED NOT DETECTED Final    Comment: Performed at [facility], [address] [place] [address], [place], [place] [address]  Culture, blood (Routine x 2)     Status: None (Preliminary result)   Collection Time: 02/13/20  5:44 PM   Specimen: Right Antecubital; Blood  Result Value Ref Range Status   Specimen Description RIGHT ANTECUBITAL  Final   Special Requests   Final    BOTTLES DRAWN AEROBIC AND ANAEROBIC Blood Culture adequate volume   Culture   Final    NO GROWTH 3 DAYS Performed at [facility], [address], [place], [place] [address]    Report Status PENDING  Incomplete  Culture, Urine     Status: None   Collection Time: 02/14/20  9:26 PM   Specimen: Urine, Clean Catch  Result Value Ref Range Status    Specimen Description   Final    URINE, CLEAN CATCH Performed at [facility], [address] [place], [place] [address]    Special Requests   Final    NONE Performed at [facility], [address] [place], [place] [address]    Culture   Final    NO GROWTH Performed at [facility], [place] [address], [place], [place] [address]    Report Status 02/16/2020 FINAL  Final  Culture, blood (routine x 2)     Status: None (Preliminary result)   Collection Time: 02/14/20  9:44 PM   Specimen: BLOOD RIGHT HAND  Result Value Ref Range Status   Specimen Description BLOOD RIGHT HAND  Final   Special Requests   Final    BOTTLES DRAWN AEROBIC ONLY Blood Culture adequate volume   Culture   Final    NO GROWTH 2 DAYS Performed at [facility], [address] [place],  [address]    Report Status PENDING  Incomplete  Culture, blood (routine x 2)     Status: None (Preliminary result)   Collection Time: 02/14/20  9:58 PM   Specimen: BLOOD LEFT HAND  Result Value Ref Range Status   Specimen Description BLOOD LEFT HAND  Final   Special Requests   Final    BOTTLES DRAWN AEROBIC ONLY Blood Culture adequate volume   Culture   Final    NO GROWTH 2 DAYS Performed at [facility], [address], [place],  [address]    Report Status PENDING  Incomplete  MRSA PCR Screening     Status: None   Collection Time: 02/15/20 12:05 AM   Specimen: Nasal Mucosa; Nasopharyngeal  Result Value Ref Range Status   MRSA by PCR NEGATIVE NEGATIVE Final    Comment:  The GeneXpert MRSA Assay (FDA approved for NASAL specimens only), is one component of a comprehensive MRSA colonization surveillance program. It is not intended to diagnose MRSA infection nor to guide or monitor treatment for MRSA infections. Performed at [facility], [address], [place], [place] [address]   Culture, blood (Routine X 2) w Reflex to ID Panel     Status: None (Preliminary result)   Collection Time: 02/16/20   2:33 PM   Specimen: BLOOD RIGHT HAND  Result Value Ref Range Status   Specimen Description BLOOD RIGHT HAND  Final   Special Requests   Final    BOTTLES DRAWN AEROBIC AND ANAEROBIC Blood Culture adequate volume Performed at [facility], [address], [place], [place] [address]    Culture PENDING  Incomplete   Report Status PENDING  Incomplete  Culture, blood (Routine X 2) w Reflex to ID Panel     Status: None (Preliminary result)   Collection Time: 02/16/20  2:33 PM   Specimen: BLOOD LEFT HAND  Result Value Ref Range Status   Specimen Description BLOOD LEFT HAND  Final   Special Requests   Final    BOTTLES DRAWN AEROBIC AND ANAEROBIC Blood Culture results may not be optimal due to an inadequate volume of blood received in culture bottles Performed at [facility], [address] [place], [place] [address]    Culture PENDING  Incomplete   Report Status PENDING  Incomplete    Radiology Reports CT Head Wo Contrast  Result Date: 02/13/2020 CLINICAL DATA:  Unresponsive EXAM: CT HEAD WITHOUT CONTRAST TECHNIQUE: Contiguous axial images were obtained from the base of the skull through the vertex without intravenous contrast. COMPARISON:  06/29/2019 FINDINGS: Brain: There again noted changes consistent with prior encephalomalacia from ischemic event in the distribution of the right middle cerebral artery involving the right temporal and parietal lobes. The overall appearance is stable. No findings to suggest acute hemorrhage, acute infarction or space-occupying mass lesion are noted. Mild atrophic changes are again seen. Vascular: No hyperdense vessel or unexpected calcification. Skull: Normal. Negative for fracture or focal lesion. Sinuses/Orbits: No acute finding. Other: None. 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 No acute abnormality noted. Electronically Signed   By: [place] Catalina M.D.   On: 02/13/2020 21:30   NM Pulmonary  Perfusion  Result Date: 02/15/2020 CLINICAL DATA:  Respiratory failure EXAM: NUCLEAR MEDICINE PERFUSION LUNG SCAN TECHNIQUE: Perfusion images were obtained in multiple projections after intravenous injection of radiopharmaceutical. Ventilation scans intentionally deferred if perfusion scan and chest x-ray adequate for interpretation during COVID 19 epidemic. RADIOPHARMACEUTICALS:  4.4 mCi Tc-77mMAA IV COMPARISON:  Chest radiograph 02/15/2020 FINDINGS: Inhomogeneous tracer distribution in a nonsegmental pattern in both lungs, pattern favoring parenchymal lung disease. Tiny subsegmental defects at lung bases. Findings are not suspicious for pulmonary embolism. IMPRESSION: No scintigraphic evidence of pulmonary embolism. Electronically Signed   By: MLavonia DanaM.D.   On: 02/15/2020 12:40   [place]ous Img Lower Unilateral Left (DVT)  Result Date: 02/15/2020 CLINICAL DATA:  Left lower extremity edema. EXAM: LEFT LOWER EXTREMITY VENOUS DOPPLER ULTRASOUND TECHNIQUE: Gray-scale sonography with graded compression, as well as color Doppler and duplex ultrasound were performed to evaluate the lower extremity deep venous systems from the level of the common femoral vein and including the common femoral, femoral, profunda femoral, popliteal and calf veins including the posterior tibial, peroneal and gastrocnemius veins when visible. The superficial great saphenous vein was also interrogated.  Spectral Doppler was utilized to evaluate flow at rest and with distal augmentation maneuvers in the common femoral, femoral and popliteal veins. COMPARISON:  None. FINDINGS: Contralateral Common Femoral Vein: Respiratory phasicity is normal and symmetric with the symptomatic side. No evidence of thrombus. Normal compressibility. Common Femoral Vein: No evidence of thrombus. Normal compressibility, respiratory phasicity and response to augmentation. Saphenofemoral Junction: No evidence of thrombus. Normal compressibility and flow on  color Doppler imaging. Profunda Femoral Vein: No evidence of thrombus. Normal compressibility and flow on color Doppler imaging. Femoral Vein: No evidence of thrombus. Normal compressibility, respiratory phasicity and response to augmentation. Popliteal Vein: No evidence of thrombus. Normal compressibility, respiratory phasicity and response to augmentation. Calf Veins: No evidence of thrombus. Normal compressibility and flow on color Doppler imaging. Superficial Great Saphenous Vein: No evidence of thrombus. Normal compressibility. Venous Reflux:  None. Other Findings: No evidence of superficial thrombophlebitis or abnormal fluid collection. IMPRESSION: No evidence of left lower extremity deep venous thrombosis. Electronically Signed   By: Aletta Edouard M.D.   On: 02/15/2020 10:32   DG CHEST PORT 1 VIEW  Result Date: 02/16/2020 CLINICAL DATA:  Recent aspiration, initial encounter EXAM: PORTABLE CHEST 1 VIEW COMPARISON:  02/15/2020 FINDINGS: Cardiac shadow is stable. The lungs are hyperinflated but clear. Gastric catheter is noted within the stomach. Endotracheal tube is seen extending to the level of the aortic arch in satisfactory position. No confluent infiltrate is noted. Mild vascular congestion remains. IMPRESSION: Stable vascular congestion.  No new focal abnormality is noted. Electronically Signed   By: [place] Catalina M.D.   On: 02/16/2020 16:04   Portable Chest xray  Result Date: 02/15/2020 CLINICAL DATA:  Acute respiratory failure. EXAM: PORTABLE CHEST 1 VIEW COMPARISON:  02/14/2020. FINDINGS: The ETT tip is stable above the carina. There is a nasogastric tube with tip and side port below the GE junction. Heart size appears normal. No pleural effusion or airspace consolidation. Pulmonary vascular congestion noted. No overt edema. IMPRESSION: Pulmonary vascular congestion. Electronically Signed   By: Kerby Moors M.D.   On: 02/15/2020 05:41   DG Chest Portable 1 View  Result Date:  02/14/2020 CLINICAL DATA:  Check gastrostomy catheter EXAM: PORTABLE CHEST 1 VIEW COMPARISON:  02/14/2020 FINDINGS: Gastric catheter is been withdrawn and now lies with the tip in the stomach. Proximal side port is noted at the gastroesophageal junction. This could be advanced a few cm deeper into the stomach. IMPRESSION: Gastric catheter as described. Electronically Signed   By: [place] Catalina M.D.   On: 02/14/2020 21:19   DG Chest Portable 1 View  Result Date: 02/14/2020 CLINICAL DATA:  Hypoxia check gastric catheter placement EXAM: PORTABLE CHEST 1 VIEW COMPARISON:  02/14/2020 FINDINGS: Endotracheal tube and gastric catheter are again seen. The gastric catheter has been advanced and is now coiled within the stomach extending into the proximal duodenum. The lungs are hyperinflated consistent with COPD. No bony abnormality is noted. IMPRESSION: Gastric catheter as described. COPD without acute abnormality. Electronically Signed   By: Inez Catalina M.D.   On: 02/14/2020 21:14   DG Chest Portable 1 View  Result Date: 02/14/2020 CLINICAL DATA:  Intubated, loss of consciousness, COPD EXAM: PORTABLE CHEST 1 VIEW COMPARISON:  02/14/2020 FINDINGS: 2 frontal views of the chest demonstrate endotracheal tube overlying tracheal air column, tip approximately 3.8 cm above carina. Enteric catheter tip and side port project over the distal thoracic esophagus. Cardiac silhouette is unremarkable. Background emphysema again noted without airspace disease, effusion, or pneumothorax. No acute  bony abnormalities. IMPRESSION: 1. Enteric catheter tip and side port projecting over the distal thoracic esophagus. Recommend advancing at least 7 cm to ensure side port placement within the gastric lumen. 2. No complication after intubation. 3. Emphysema. Electronically Signed   By: Randa Ngo M.D.   On: 02/14/2020 18:40   DG Chest Portable 1 View  Result Date: 02/14/2020 CLINICAL DATA:  Shortness of breath, history of COPD EXAM:  PORTABLE CHEST 1 VIEW COMPARISON:  02/13/2020 FINDINGS: Emphysema and chronic interstitial changes. No new consolidation or edema. No pleural effusion or pneumothorax. Stable cardiomediastinal contours. IMPRESSION: COPD without acute process in the chest. Electronically Signed   By: Macy Mis M.D.   On: 02/14/2020 17:32   DG Chest Portable 1 View  Result Date: 02/13/2020 CLINICAL DATA:  Hypotension. EXAM: PORTABLE CHEST 1 VIEW COMPARISON:  Prior chest radiographs 12/16/2019 and earlier. FINDINGS: Heart size within normal limits. Prior median sternotomy/CABG. Aortic atherosclerosis. Unchanged nonspecific chronic prominence of the interstitial lung markings. No appreciable airspace consolidation or frank pulmonary edema. No evidence of pleural effusion or pneumothorax. No acute bony abnormality identified. ACDF hardware within the lower cervical spine. IMPRESSION: No evidence of acute cardiopulmonary abnormality. Chronic nonspecific prominence of the interstitial lung markings, unchanged. Prior median sternotomy/CABG. Aortic Atherosclerosis (ICD10-I70.0). Electronically Signed   By: Kellie Simmering DO   On: 02/13/2020 18:15   ECHOCARDIOGRAM COMPLETE  Result Date: 02/16/2020    ECHOCARDIOGRAM REPORT   Patient Name:   HYDEN SOLEY Date of Exam: 02/16/2020 Medical Rec #:  [id number]         Height:       73.0 in Accession #:    [id number]        Weight:       190.3 lb Date of Birth:  02-Apr-1961         BSA:          2.106 m Patient Age:    26 years          BP:           93/64 mmHg Patient Gender: M                 HR:           52 bpm. Exam Location:  [facility] Procedure: 2D Echo Indications:    Bacteremia 790.7 / R78.81  History:        Patient has prior history of Echocardiogram examinations, most                 recent 06/01/2019. CAD, COPD; Risk Factors:Current Smoker and                 Dyslipidemia. Opioid dependence.  Sonographer:    Leavy Cella RDCS (AE) Referring Phys: [id number] Sean Macwilliams  IMPRESSIONS  1. Left ventricular ejection fraction, by estimation, is 45 to 50%. The left ventricle has mildly decreased function. The left ventricle has no regional wall motion abnormalities. There is mild left ventricular hypertrophy. Left ventricular diastolic parameters are indeterminate.  2. Ventricular septum is flattened in systole consistent with RV pressure overload. . Right ventricular systolic function is severely reduced. The right ventricular size is severely enlarged.  3. Right atrial size was moderately dilated.  4. The mitral valve is normal in structure. No evidence of mitral valve regurgitation. No evidence of mitral stenosis.  5. AV and LVOT Dopplers are off axis and likely underestimated. Visually there is no significant stenosis or regurgitation. . The aortic  valve is tricuspid. Aortic valve regurgitation is not visualized. No aortic stenosis is present. FINDINGS  Left Ventricle: Left ventricular ejection fraction, by estimation, is 45 to 50%. The left ventricle has mildly decreased function. The left ventricle has no regional wall motion abnormalities. The left ventricular internal cavity size was normal in size. There is mild left ventricular hypertrophy. Left ventricular diastolic parameters are indeterminate. Right Ventricle: Ventricular septum is flattened in systole consistent with RV pressure overload. The right ventricular size is severely enlarged. Right vetricular wall thickness was not assessed. Right ventricular systolic function is severely reduced. Left Atrium: Left atrial size was normal in size. Right Atrium: Right atrial size was moderately dilated. Pericardium: There is no evidence of pericardial effusion. Mitral Valve: The mitral valve is normal in structure. No evidence of mitral valve regurgitation. No evidence of mitral valve stenosis. Tricuspid Valve: The tricuspid valve is normal in structure. Tricuspid valve regurgitation is not demonstrated. No evidence of tricuspid  stenosis. Aortic Valve: AV and LVOT Dopplers are off axis and likely underestimated. Visually there is no significant stenosis or regurgitation. The aortic valve is tricuspid. Aortic valve regurgitation is not visualized. No aortic stenosis is present. Pulmonic Valve: The pulmonic valve was not well visualized. Pulmonic valve regurgitation is not visualized. No evidence of pulmonic stenosis. Aorta: The aortic root is normal in size and structure. Pulmonary Artery: Indeterminant PASP, inadequate TR jet. Venous: IVC assessment for right atrial pressure unable to be performed due to mechanical ventilation. IAS/Shunts: No atrial level shunt detected by color flow Doppler.  LEFT VENTRICLE PLAX 2D LVIDd:         4.16 cm  Diastology LVIDs:         3.30 cm  LV e' medial:    6.31 cm/s LV PW:         1.35 cm  LV E/e' medial:  9.3 LV IVS:        1.00 cm  LV e' lateral:   7.51 cm/s LVOT diam:     1.90 cm  LV E/e' lateral: 7.8 LVOT Area:     2.84 cm  RIGHT VENTRICLE TAPSE (M-mode): 1.0 cm LEFT ATRIUM             Index       RIGHT ATRIUM           Index LA diam:        2.60 cm 1.23 cm/m  RA Area:     15.60 cm LA Vol (A2C):   23.8 ml 11.30 ml/m RA Volume:   46.60 ml  22.12 ml/m LA Vol (A4C):   26.7 ml 12.68 ml/m LA Biplane Vol: 26.0 ml 12.34 ml/m   AORTA Ao Root diam: 3.10 cm MITRAL VALVE               TRICUSPID VALVE MV Area (PHT): 3.37 cm    TR Peak grad:   17.0 mmHg MV Decel Time: 225 msec    TR Vmax:        206.00 cm/s MV E velocity: 58.70 cm/s MV A velocity: 42.40 cm/s  SHUNTS MV E/A ratio:  1.38        Systemic Diam: 1.90 cm Carlyle Dolly MD Electronically signed by Carlyle Dolly MD Signature Date/Time: 02/16/2020/12:32:09 PM    Final    CBC Recent Labs  Lab 02/13/20 1652 02/14/20 1723 02/15/20 0142 02/16/20 0804  WBC 8.8 19.6* 19.4* 18.1*  HGB 16.8 17.1* 16.6 15.9  HCT 55.7* 58.5* 54.8* 48.9  PLT 270  352 213 226  MCV 94.2 96.4 93.5 86.4  MCH 28.4 28.2 28.3 28.1  MCHC 30.2 29.2* 30.3 32.5  RDW  19.6* 19.1* 19.3* 19.6*  LYMPHSABS 0.6* 0.8  --   --   MONOABS 0.8 1.8*  --   --   EOSABS 0.0 0.0  --   --   BASOSABS 0.1 0.0  --   --     Chemistries  Recent Labs  Lab 02/13/20 1652 02/14/20 1723 02/15/20 0142 02/16/20 0804  NA 133* 135 136 138  K 4.8 4.2 4.4 3.1*  CL 91* 91* 98 104  CO2 33* 32 25 26  GLUCOSE 118* 145* 143* 128*  BUN 27* 40* 41* 40*  CREATININE 1.59* 1.85* 1.47* 1.06  CALCIUM 10.6* 10.6* 9.8 9.1  MG  --   --   --  1.9  AST 35 294*  --  490*  ALT 23 292*  --  744*  ALKPHOS 85 96  --  74  BILITOT 0.9 1.2  --  0.9    Recent Labs    02/14/20 2045  TRIG 82   Lab Results  Component Value Date   HGBA1C 6.9 (H) 02/15/2020   ------------------------------------------------------------------------------------------------------------------ No results for input(s): TSH, T4TOTAL, T3FREE, THYROIDAB in the last 72 hours.  Invalid input(s): FREET3 ------------------------------------------------------------------------------------------------------------------ No results for input(s): VITAMINB12, FOLATE, FERRITIN, TIBC, IRON, RETICCTPCT in the last 72 hours.  Coagulation profile Recent Labs  Lab 02/13/20 1652  INR 1.3*    No results for input(s): DDIMER in the last 72 hours.  Cardiac Enzymes No results for input(s): CKMB, TROPONINI, MYOGLOBIN in the last 168 hours.  Invalid input(s): CK ------------------------------------------------------------------------------------------------------------------    Component Value Date/Time   BNP 495.0 (H) 12/16/2019 1805    Roxan Hockey M.D on 02/16/2020 at 4:28 PM  Go to [email or web address] - for contact info  [facility] - Office  [phone]

## 2020-02-16 NOTE — Progress Notes (Signed)
NAME:  Antonio Ramirez, MRN:  [id number], DOB:  [birth year], LOS: 2 ADMISSION DATE:  02/14/2020, CONSULTATION DATE:  11/3 REFERRING MD:  Emokpae/[facility] CHIEF COMPLAINT:  Vent dep resp failure   Brief History    52 yowm smoker with clinical evidence of copd and h/o drug abuse admitted with severe hypercarbic resp failure and intubated in ER p failed to respond to narcan pm 02/14/20 and PPCM service consulted 11/3 re vent dep/ hemodynamic instability.   History of present illness    per epic records: 59 y.o. male with medical history significant for opiate dependence, COPD, carotid artery disease, depression. Patient was brought to the ED reports of altered mental status.  He came 11/1 for same issue, but this resolved and was back to baseline hence was discharged home. History is obtained from chart review and talking to patient's significant other [name] on the phone. By report  patient level of consciousness started declining again.  He did not appear short of breath and did not complain of difficulty breathing.  No cough.  No pain with urination.  No known/ witnessed  abuse of his pain medications.  Report  that patient last took his long-acting oxycodone at 3 AM   11/2   and has not taken any since then.  He takes as needed oxycodone maybe once to twice a day, has not taken any 11/2.  He has chronic back pain.  No alcohol abuse. On EMS arrival at home, patient's O2 sats was 70% on room air.  Received 2 doses of [place] Vaccine- April/May.  ED Course:  O2 sats 90% on 4 L placed on 15 L nonrebreather.  T-max 100.9, heart rate initially one twenties improved ninety eighties, initial tachypnea to 34, blood pressure systolic 98 - 622, WBC 29.7.  Ammonia  66.  Creatinine elevated 1.85.  ABG showed pH of 7.1, PCO2 of 118, PO2 127.  Portable chest x-ray COPD without acute process.  UDS positive for opiates and cannabinoids.  2 mg of Narcan was given, with brief improvement in mental status, and then  patient vomited, additional Narcan given and patient was placed on Narcan drip, but mentation did not improve subsequently.  With altered mentation and significant respiratory acidosis, patient was intubated in the ED for acute hypoxic and hypercarbic respiratory failure.   Past Medical History     Significant Hospital Events   SVT am 11/3 better p lopressor IV   Consults:  PCCM 11/3  Cards  11/4   Procedures:  Oral ET 11/2 >>>  Significant Diagnostic Tests:   L LE venous doplers 11/3 neg  NM Perfusion study 11/3 > No scintigraphic evidence of pulmonary embolism Echo 11/4 >>>  Micro Data:  Urine 11/2 > neg  BC x 2  11/3 >>> MRSA PCR  11/3  Neg    Antimicrobials:  maxepime  11/2 Flagyl  11/2 Vanc  11/2  Ancef 11/3 >>>   Scheduled Meds: . aspirin  81 mg Per Tube Daily  . chlorhexidine gluconate (MEDLINE KIT)  15 mL Mouth Rinse BID  . Chlorhexidine Gluconate Cloth  6 each Topical Daily  . clopidogrel  75 mg Oral Daily  . docusate  100 mg Oral BID  . enoxaparin (LOVENOX) injection  40 mg Subcutaneous Q24H  . hydrocortisone sodium succinate  50 mg Intravenous Q12H  . influenza vac split quadrivalent PF  0.5 mL Intramuscular Tomorrow-1000  . insulin aspart  0-5 Units Subcutaneous QHS  . insulin aspart  0-6 Units Subcutaneous  TID WC  . mouth rinse  15 mL Mouth Rinse 10 times per day  . metoprolol tartrate  2.5 mg Intravenous Q6H  . pneumococcal 23 valent vaccine  0.5 mL Intramuscular Tomorrow-1000  . potassium chloride  40 mEq Per Tube BID   Continuous Infusions: . sodium chloride    .  ceFAZolin (ANCEF) IV 2 g (02/16/20 1248)  . dextrose 5 % and 0.9% NaCl 50 mL/hr at 02/16/20 1250  . famotidine (PEPCID) IV 20 mg (02/16/20 0902)  . propofol (DIPRIVAN) infusion 35 mcg/kg/min (02/16/20 0944)   PRN Meds:.Place/Maintain arterial line **AND** sodium chloride, acetaminophen **OR** acetaminophen, fentaNYL (SUBLIMAZE) injection, fentaNYL (SUBLIMAZE) injection,  ipratropium-albuterol, ondansetron **OR** ondansetron (ZOFRAN) IV   Interim history/subjective:   still heavily sedated on afternoon rounds, wua pending to see if svt problematic with wean to extubate    Objective   Blood pressure 93/64, pulse (!) 52, temperature 98.8 F (37.1 C), temperature source Oral, resp. rate 16, height _0  (1.854 m), weight 86.3 kg, SpO2 100 %.    Vent Mode: PRVC FiO2 (%):  [40 %] 40 % Set Rate:  [18 bmp] 18 bmp Vt Set:  [650 mL] 650 mL PEEP:  [5 cmH20] 5 cmH20 Plateau Pressure:  [14 cmH20-19 cmH20] 19 cmH20   Intake/Output Summary (Last 24 hours) at 02/16/2020 1410 Last data filed at 02/16/2020 0902 Gross per 24 hour  Intake 3260.33 ml  Output 1450 ml  Net 1810.33 ml   Filed Weights   02/15/20 0114 02/15/20 0300 02/16/20 0343  Weight: 83.1 kg 81.9 kg 86.3 kg    Examination: Tmax   98.8 Pt sedated on diprovan No jvd Oropharynx ET  Neck supple Lungs with a few scattered exp > insp rhonchi bilaterally RRR no s3 or or sign murmur Abd obese soft nl  excursion  Extr warm with trace pitting  edema  - No clubbing noted      I personally reviewed images and agree with radiology impression as follows:  CXR:   11/4  Pending    Resolved Hospital Problem list      Assessment & Plan:  1) vent dep acute hypercabic, hypoxemic resp failure s/p apparent drug od in pt with underlying copd/ still smoking ? Severity >still air trapping today at the back up rate >>> reduce back up rate to 14   2) Hypotension resolved ? Septic  >> abx per [facility], changed to stress Hydrocortisone as really not wheezing so no need for solumedrol  >> vol expand as needed if no edema on cxr pending   3)  Mild AKI ? Due to hypotension Lab Results  Component Value Date   CREATININE 1.06 02/16/2020   CREATININE 1.47 (H) 02/15/2020   CREATININE 1.85 (H) 02/14/2020   >> resolved     4) LFT's elevated ? Shock liver?  - no witnessed tylenol od with level at last admit  undectable  - still trending up 11/4 ? Why and alb down to 2.6 c/w protein cal malnutrition   Best practice:  Diet: per [facility] Pain/Anxiety/Delirium protocol (if indicated): per [facility] VAP protocol (if indicated):  DVT prophylaxis:  lovenox GI prophylaxis: pepcid Glucose control: per [facility]  Mobility: bed rest  Code Status: per [facility]  Family Communication: per [facility] Disposition: ICU  Labs   CBC: Recent Labs  Lab 02/13/20 1652 02/14/20 1723 02/15/20 0142 02/16/20 0804  WBC 8.8 19.6* 19.4* 18.1*  NEUTROABS 7.2 16.8*  --   --   HGB 16.8 17.1* 16.6 15.9  HCT 55.7* 58.5* 54.8* 48.9  MCV 94.2 96.4 93.5 86.4  PLT 270 352 213 643    Basic Metabolic Panel: Recent Labs  Lab 02/13/20 1652 02/14/20 1723 02/15/20 0142 02/16/20 0804  NA 133* 135 136 138  K 4.8 4.2 4.4 3.1*  CL 91* 91* 98 104  CO2 33* 32 25 26  GLUCOSE 118* 145* 143* 128*  BUN 27* 40* 41* 40*  CREATININE 1.59* 1.85* 1.47* 1.06  CALCIUM 10.6* 10.6* 9.8 9.1  MG  --   --   --  1.9   GFR: Estimated Creatinine Clearance: 83.8 mL/min (by C-G formula based on SCr of 1.06 mg/dL). Recent Labs  Lab 02/13/20 1652 02/13/20 1949 02/14/20 1723 02/14/20 2158 02/15/20 0142 02/16/20 0804  WBC 8.8  --  19.6*  --  19.4* 18.1*  LATICACIDVEN 2.1* 1.3  --  4.6* 3.1*  --     Liver Function Tests: Recent Labs  Lab 02/13/20 1652 02/14/20 1723 02/16/20 0804  AST 35 294* 490*  ALT 23 292* 744*  ALKPHOS 85 96 74  BILITOT 0.9 1.2 0.9  PROT 6.9 7.0 5.3*  ALBUMIN 3.5 3.5 2.6*   No results for input(s): LIPASE, AMYLASE in the last 168 hours. Recent Labs  Lab 02/14/20 1723  AMMONIA 66*    ABG    Component Value Date/Time   PHART 7.403 02/15/2020 1710   PCO2ART 45.8 02/15/2020 1710   PO2ART 95.9 02/15/2020 1710   HCO3 27.0 02/15/2020 1710   O2SAT 97.0 02/15/2020 1710     Coagulation Profile: Recent Labs  Lab 02/13/20 1652  INR 1.3*    Cardiac Enzymes: No results for input(s): CKTOTAL, CKMB, CKMBINDEX,  TROPONINI in the last 168 hours.  HbA1C: Hgb A1c MFr Bld  Date/Time Value Ref Range Status  02/15/2020 01:42 AM 6.9 (H) 4.8 - 5.6 % Final    Comment:    (NOTE) Pre diabetes:          5.7%-6.4%  Diabetes:              >6.4%  Glycemic control for   <7.0% adults with diabetes   06/01/2019 07:34 AM 6.2 (H) 4.8 - 5.6 % Final    Comment:    (NOTE) Pre diabetes:          5.7%-6.4% Diabetes:              >6.4% Glycemic control for   <7.0% adults with diabetes     CBG: Recent Labs  Lab 02/15/20 1244 02/15/20 1640 02/15/20 1937 02/16/20 0757 02/16/20 1130  GLUCAP 170* 134* 132* 130* 121*      The patient is critically ill with multiple organ systems failure and requires high complexity decision making for assessment and support, frequent evaluation and titration of therapies, application of advanced monitoring technologies and extensive interpretation of multiple databases. Critical Care Time devoted to patient care services described in this note is 32 minutes.

## 2020-02-16 NOTE — Progress Notes (Signed)
Pt sustaining SVT in the 170s. MD notified and 5mg  lopressor ordered and given.

## 2020-02-16 NOTE — Progress Notes (Signed)
*  PRELIMINARY RESULTS* Echocardiogram 2D Echocardiogram has been performed.  Leavy Cella 02/16/2020, 10:23 AM

## 2020-02-17 DIAGNOSIS — R4182 Altered mental status, unspecified: Secondary | ICD-10-CM

## 2020-02-17 DIAGNOSIS — J9601 Acute respiratory failure with hypoxia: Secondary | ICD-10-CM | POA: Diagnosis not present

## 2020-02-17 DIAGNOSIS — F1129 Opioid dependence with unspecified opioid-induced disorder: Secondary | ICD-10-CM | POA: Diagnosis not present

## 2020-02-17 DIAGNOSIS — A419 Sepsis, unspecified organism: Secondary | ICD-10-CM | POA: Diagnosis not present

## 2020-02-17 DIAGNOSIS — I50812 Chronic right heart failure: Secondary | ICD-10-CM

## 2020-02-17 DIAGNOSIS — I2510 Atherosclerotic heart disease of native coronary artery without angina pectoris: Secondary | ICD-10-CM | POA: Diagnosis not present

## 2020-02-17 DIAGNOSIS — I25810 Atherosclerosis of coronary artery bypass graft(s) without angina pectoris: Secondary | ICD-10-CM | POA: Diagnosis not present

## 2020-02-17 DIAGNOSIS — J441 Chronic obstructive pulmonary disease with (acute) exacerbation: Secondary | ICD-10-CM | POA: Diagnosis not present

## 2020-02-17 DIAGNOSIS — J9602 Acute respiratory failure with hypercapnia: Secondary | ICD-10-CM

## 2020-02-17 DIAGNOSIS — I471 Supraventricular tachycardia: Secondary | ICD-10-CM | POA: Diagnosis not present

## 2020-02-17 DIAGNOSIS — Z978 Presence of other specified devices: Secondary | ICD-10-CM | POA: Diagnosis not present

## 2020-02-17 LAB — COMPREHENSIVE METABOLIC PANEL
ALT: 559 U/L — ABNORMAL HIGH (ref 0–44)
AST: 841 U/L — ABNORMAL HIGH (ref 15–41)
Albumin: 2.7 g/dL — ABNORMAL LOW (ref 3.5–5.0)
Alkaline Phosphatase: 80 U/L (ref 38–126)
Anion gap: 8 (ref 5–15)
BUN: 38 mg/dL — ABNORMAL HIGH (ref 6–20)
CO2: 27 mmol/L (ref 22–32)
Calcium: 9.3 mg/dL (ref 8.9–10.3)
Chloride: 104 mmol/L (ref 98–111)
Creatinine, Ser: 0.96 mg/dL (ref 0.61–1.24)
GFR, Estimated: >60 mL/min (ref >60)
Glucose, Bld: 104 mg/dL — ABNORMAL HIGH (ref 70–99)
Potassium: 4.1 mmol/L (ref 3.5–5.1)
Sodium: 139 mmol/L (ref 135–145)
Total Bilirubin: 1 mg/dL (ref 0.3–1.2)
Total Protein: 5.4 g/dL — ABNORMAL LOW (ref 6.5–8.1)

## 2020-02-17 LAB — CBC
HCT: 51.2 % (ref 39.0–52.0)
Hemoglobin: 16.7 g/dL (ref 13.0–17.0)
MCH: 28.4 pg (ref 26.0–34.0)
MCHC: 32.6 g/dL (ref 30.0–36.0)
MCV: 87.1 fL (ref 80.0–100.0)
Platelets: 240 10*3/uL (ref 150–400)
RBC: 5.88 MIL/uL — ABNORMAL HIGH (ref 4.22–5.81)
RDW: 19.6 % — ABNORMAL HIGH (ref 11.5–15.5)
WBC: 17.7 10*3/uL — ABNORMAL HIGH (ref 4.0–10.5)
nRBC: 0 % (ref 0.0–0.2)

## 2020-02-17 LAB — GLUCOSE, CAPILLARY
Glucose-Capillary: 107 mg/dL — ABNORMAL HIGH (ref 70–99)
Glucose-Capillary: 109 mg/dL — ABNORMAL HIGH (ref 70–99)
Glucose-Capillary: 110 mg/dL — ABNORMAL HIGH (ref 70–99)
Glucose-Capillary: 86 mg/dL (ref 70–99)

## 2020-02-17 MED ORDER — METOPROLOL TARTRATE 25 MG PO TABS
25.0000 mg | ORAL_TABLET | Freq: Three times a day (TID) | ORAL | Status: DC
  Administered 2020-02-17 – 2020-02-18 (×2): 25 mg via ORAL
  Filled 2020-02-17 (×3): qty 1

## 2020-02-17 MED ORDER — TRAZODONE HCL 50 MG PO TABS
100.0000 mg | ORAL_TABLET | Freq: Every day | ORAL | Status: DC
  Administered 2020-02-17: 100 mg via ORAL
  Filled 2020-02-17: qty 2

## 2020-02-17 MED ORDER — LORAZEPAM 2 MG/ML IJ SOLN
1.0000 mg | Freq: Four times a day (QID) | INTRAMUSCULAR | Status: DC | PRN
  Administered 2020-02-18: 1 mg via INTRAVENOUS
  Filled 2020-02-17: qty 1

## 2020-02-17 MED ORDER — FUROSEMIDE 10 MG/ML IJ SOLN
40.0000 mg | Freq: Once | INTRAMUSCULAR | Status: AC
  Administered 2020-02-17: 40 mg via INTRAVENOUS
  Filled 2020-02-17: qty 4

## 2020-02-17 MED ORDER — OXYCODONE HCL 5 MG PO TABS: 10.0000 mg | ORAL_TABLET | Freq: Four times a day (QID) | ORAL | Status: DC | PRN

## 2020-02-17 MED ORDER — DIPHENHYDRAMINE HCL 50 MG/ML IJ SOLN: 25.0000 mg | Freq: Four times a day (QID) | INTRAMUSCULAR | Status: DC | PRN

## 2020-02-17 NOTE — Progress Notes (Signed)
Patient Demographics:    Antonio Ramirez, is a 59 y.o. male, DOB - [birth year], WGY:[id number]  Admit date - 02/14/2020   Admitting Physician Ejiroghene Arlyce Dice, MD  Outpatient Primary MD for the patient is [name], DO  LOS - 3   Chief Complaint  Patient presents with  . Altered Mental Status        Subjective:    Antonio Ramirez today remains intubated and sedated-  02/17/20 -Initially did okay with sedation vacation and weaning trial however became tachypneic so patient is back on PRVC -We will continue to attempt to wean and extubate today if goals are met -Episodes of tachyarrhythmia less frequent on IV metoprolol  Concerns about blood-tinged secretions from OG tube however H&H is stable and patient is on IV Pepcid -  Assessment  & Plan :    Active Problems:   Acute respiratory failure with hypoxia (HCC)   Endotracheally intubated   Opioid dependence ([place])   COPD with acute exacerbation (HCC)   Depression   CAD (coronary artery disease)  Brief Summary:- a58 y.o.malewith a history of COPD, ongoing tobacco use, CAD s/p CABG, carotid artery disease,  chronic back pain with opioid dependence, and opioid overdose as well as chronic depression admitted on 02/14/2020 with acute hypoxic respiratory failure requiring intubation and mechanical ventilation- -Patient had a similar presentation in March 2021 when he was intubated for altered mentation subsequently did okay was extubated  A/p 1)Acute hypoxic and hypercapnic respiratory failure--- initially thought to be from a combination of opiate abuse and COPD--remains intubated and sedated, pulmonary consult from Dr. Melvyn Novas appreciated --ABG improved--specifically respiratory acidosis has resolved -VQ scan negative for PE -Lower extremity venous Dopplers without acute DVT --Chest x-ray on 02/15/2020 and 02/16/20 with possible pulm venous  congestion--repeated Lasix -02/17/20 -Initially did okay with sedation vacation and weaning trial however became tachypneic so patient is back on PRVC -We will continue to attempt to wean and extubate today if goals are met -Episodes of tachyarrhythmia less frequent on IV metoprolol  2)SIRS-- meets SIRS criteria, sepsis Not Confirmed at this time as source of infection yet to be identified ---There is suspicion for possible sepsis with preliminary blood cultures from 02/13/2020 with staph epi-contaminant Versus infection -Continue IV Ancef, repeat blood cultures from 02/14/2020 and 02/16/2020--- NGTD  3)Staph Epi bacteremia and possible severe sepsis--- please see #2 above --Episodes of hemodynamic instability persist with erratic blood pressures --Transthoracic echocardiogram without evidence for endocarditis, --WBC 19.4>>18.1>>17.7--patient is on steroids -Fevers resolved  4) acute COPD exacerbation--- bronchodilators and steroids as ordered, Dr Melvyn Novas switched patient to hydrocortisone from Solu-Medrol due to hemodynamic instability  5)CAD---s/p CABG 2006 and PVD--- -c/n aspirin and Plavix -Echo with EF of 40 to 45% which is down from 55 to 60% back in February 2021  - 6)AKI----acute kidney injury on CKD stage -#B --   creatinine is down to 0.9 (peak was 1.85)  ,  baseline creatinine = 1.5   renally adjust medications, avoid nephrotoxic agents / dehydration  / hypotension -AKI resolved with hydration  7)Social/Ethics--plan of care and advanced directive discussed with patient ex-wife who is also his POA--patient is a full code with full scope of treatment  8)FEN--patient is n.p.o (intubated) ., continue dextrose solution,  check CBGs and adjust sliding scale  9)Tachyarrhythmia--- patient alternating between SVT and A. fib with RVR--Cardiology consult appreciated, need to replace potassium, echo with reduced EF, -Metoprolol as ordered -Plan to convert to p.o. metoprolol post  extubation -Cardiology recommends against anticoagulation at this time given low A. fib burden  CRITICAL CARE Performed by: Roxan Hockey   Total critical care time: 44 minutes  Critical care time was exclusive of separately billable procedures and treating other patients.  Critical care was necessary to treat or prevent imminent or life-threatening deterioration. Vent Settings- -PRVC/40/5/23/640 --Weaning trial today with possible extubation  Critical care was time spent personally by me on the following activities: development of treatment plan with patient and/or surrogate as well as nursing, discussions with consultants, evaluation of patient's response to treatment, examination of patient, obtaining history from patient or surrogate, ordering and performing treatments and interventions, ordering and review of laboratory studies, ordering and review of radiographic studies, pulse oximetry and re-evaluation of patient's condition.   Disposition/Need for in-Hospital Stay- patient unable to be discharged at this time due to --remains intubated, sedated, requiring IV antibiotics pending repeat cultures, requiring IV fluids and hydrocortisone for pressor support  Status is: Inpatient  Remains inpatient appropriate because:remains intubated, sedated, requiring IV antibiotics pending repeat cultures, requiring IV fluids and hydrocortisone for pressor support   Disposition: The patient is from: Home              Anticipated d/c is to: Home              Anticipated d/c date is: > 3 days              Patient currently is not medically stable to d/c. Barriers: Not Clinically Stable- remains intubated, sedated, requiring IV antibiotics pending repeat cultures, requiring IV fluids and hydrocortisone for pressor support   Code Status : full  Family Communication:   Discussed with patient's ex- wife/POA  Consults  :  PCCM  DVT Prophylaxis  :  Lovenox -  - SCDs    Lab Results   Component Value Date   PLT 240 02/17/2020    Inpatient Medications  Scheduled Meds: . aspirin  81 mg Per Tube Daily  . chlorhexidine gluconate (MEDLINE KIT)  15 mL Mouth Rinse BID  . Chlorhexidine Gluconate Cloth  6 each Topical Daily  . clopidogrel  75 mg Oral Daily  . docusate  100 mg Oral BID  . enoxaparin (LOVENOX) injection  40 mg Subcutaneous Q24H  . hydrocortisone sodium succinate  50 mg Intravenous Q12H  . influenza vac split quadrivalent PF  0.5 mL Intramuscular Tomorrow-1000  . insulin aspart  0-5 Units Subcutaneous QHS  . insulin aspart  0-6 Units Subcutaneous TID WC  . mouth rinse  15 mL Mouth Rinse 10 times per day  . metoprolol tartrate  5 mg Intravenous Q6H  . pneumococcal 23 valent vaccine  0.5 mL Intramuscular Tomorrow-1000   Continuous Infusions: . sodium chloride    .  ceFAZolin (ANCEF) IV 2 g (02/17/20 9449)  . dextrose 5 % and 0.9% NaCl 50 mL/hr at 02/17/20 0635  . famotidine (PEPCID) IV 20 mg (02/17/20 0920)  . propofol (DIPRIVAN) infusion Stopped (02/17/20 1030)   PRN Meds:.Place/Maintain arterial line **AND** sodium chloride, acetaminophen **OR** acetaminophen, fentaNYL (SUBLIMAZE) injection, fentaNYL (SUBLIMAZE) injection, ipratropium-albuterol, ondansetron **OR** ondansetron (ZOFRAN) IV    Anti-infectives (From admission, onward)   Start     Dose/Rate Route Frequency Ordered Stop   02/15/20 2200  vancomycin (  VANCOCIN) IVPB 1000 mg/200 mL premix  Status:  Discontinued        1,000 mg 200 mL/hr over 60 Minutes Intravenous Every 24 hours 02/14/20 2149 02/15/20 1044   02/15/20 1130  ceFAZolin (ANCEF) IVPB 2g/100 mL premix        2 g 200 mL/hr over 30 Minutes Intravenous Every 8 hours 02/15/20 1044     02/14/20 2200  metroNIDAZOLE (FLAGYL) IVPB 500 mg  Status:  Discontinued        500 mg 100 mL/hr over 60 Minutes Intravenous Every 8 hours 02/14/20 2136 02/15/20 1433   02/14/20 2200  vancomycin (VANCOREADY) IVPB 1250 mg/250 mL        1,250 mg 166.7  mL/hr over 90 Minutes Intravenous  Once 02/14/20 2148 02/15/20 0128   02/14/20 2200  ceFEPIme (MAXIPIME) 2 g in sodium chloride 0.9 % 100 mL IVPB  Status:  Discontinued        2 g 200 mL/hr over 30 Minutes Intravenous Every 12 hours 02/14/20 2148 02/15/20 1044        Objective:   Vitals:   02/17/20 0900 02/17/20 1000 02/17/20 1100 02/17/20 1200  BP: (!) 144/89 (!) 142/90 (!) 137/93 (!) 143/82  Pulse: 67 66 65 83  Resp: '15 14 14 ' (!) 25  Temp:    98.8 F (37.1 C)  TempSrc:    Oral  SpO2: 100% 100% 100% 98%  Weight:      Height:        Wt Readings from Last 3 Encounters:  02/17/20 86.2 kg  02/13/20 70.3 kg  12/16/19 80.3 kg     Intake/Output Summary (Last 24 hours) at 02/17/2020 1310 Last data filed at 02/17/2020 1200 Gross per 24 hour  Intake 2176.63 ml  Output 2275 ml  Net -98.37 ml    Physical Exam  Gen:-Intubated and sedated HEENT:-ET and OG tubes  Neck-right IJ central line  lungs-symmetrical air movement, no wheezing CV- S1, S2 normal, regular , CABG scar Abd-  +ve B.Sounds, Abd Soft, not distended,    Extremity/Skin:- No  edema, pedal pulses present  Neuro-Psych-Limited exam as patient is intubated and sedated  GU-Foley in situ ---adequate urine output   Data Review:   Micro Results Recent Results (from the past 240 hour(s))  Respiratory Panel by RT PCR (Flu A&B, Covid) - Nasopharyngeal Swab     Status: None   Collection Time: 02/13/20  4:51 PM   Specimen: Nasopharyngeal Swab  Result Value Ref Range Status   SARS Coronavirus 2 by RT PCR NEGATIVE NEGATIVE Final    Comment: (NOTE) SARS-CoV-2 target nucleic acids are NOT DETECTED.  The SARS-CoV-2 RNA is generally detectable in upper respiratoy specimens during the acute phase of infection. The lowest concentration of SARS-CoV-2 viral copies this assay can detect is 131 copies/mL. A negative result does not preclude SARS-Cov-2 infection and should not be used as the sole basis for treatment or other  patient management decisions. A negative result may occur with  improper specimen collection/handling, submission of specimen other than nasopharyngeal swab, presence of viral mutation(s) within the areas targeted by this assay, and inadequate number of viral copies (<131 copies/mL). A negative result must be combined with clinical observations, patient history, and epidemiological information. The expected result is Negative.  Fact Sheet for Patients:  PinkCheek.be  Fact Sheet for Healthcare Providers:  GravelBags.it  This test is no t yet approved or cleared by the [place] FDA and  has been authorized for detection and/or diagnosis  of SARS-CoV-2 by FDA under an Emergency Use Authorization (EUA). This EUA will remain  in effect (meaning this test can be used) for the duration of the COVID-19 declaration under Section 564(b)(1) of the Act, 21 U.S.C. section 360bbb-3(b)(1), unless the authorization is terminated or revoked sooner.     Influenza A by PCR NEGATIVE NEGATIVE Final   Influenza B by PCR NEGATIVE NEGATIVE Final    Comment: (NOTE) The Xpert Xpress SARS-CoV-2/FLU/RSV assay is intended as an aid in  the diagnosis of influenza from Nasopharyngeal swab specimens and  should not be used as a sole basis for treatment. Nasal washings and  aspirates are unacceptable for Xpert Xpress SARS-CoV-2/FLU/RSV  testing.  Fact Sheet for Patients: PinkCheek.be  Fact Sheet for Healthcare Providers: GravelBags.it  This test is not yet approved or cleared by the [place] FDA and  has been authorized for detection and/or diagnosis of SARS-CoV-2 by  FDA under an Emergency Use Authorization (EUA). This EUA will remain  in effect (meaning this test can be used) for the duration of the  Covid-19 declaration under Section 564(b)(1) of the Act, 21  U.S.C. section  360bbb-3(b)(1), unless the authorization is  terminated or revoked. Performed at [facility], [address], [place], [place] [address]   Culture, blood (Routine x 2)     Status: Abnormal   Collection Time: 02/13/20  4:52 PM   Specimen: Right Antecubital; Blood  Result Value Ref Range Status   Specimen Description   Final    RIGHT ANTECUBITAL Performed at [facility], [address] [place], [place] [address]    Special Requests   Final    BOTTLES DRAWN AEROBIC AND ANAEROBIC Blood Culture adequate volume Performed at [facility], [address], [place], [place] [address]    Culture  Setup Time   Final    GRAM POSITIVE COCCI AEROBIC BOTTLE ONLY CRITICAL RESULT CALLED TO, READ BACK BY AND VERIFIED WITH: G. Coffee PharmD 9:35 02/15/20 ([email or web address])    Culture (A)  Final    STAPHYLOCOCCUS EPIDERMIDIS THE SIGNIFICANCE OF ISOLATING THIS ORGANISM FROM A SINGLE SET OF BLOOD CULTURES WHEN MULTIPLE SETS ARE DRAWN IS UNCERTAIN. PLEASE NOTIFY THE MICROBIOLOGY DEPARTMENT WITHIN ONE WEEK IF SPECIATION AND SENSITIVITIES ARE REQUIRED. Performed at [facility], [place] [address] [place], [place] [address]    Report Status 02/16/2020 FINAL  Final  Blood Culture ID Panel (Reflexed)     Status: Abnormal   Collection Time: 02/13/20  4:52 PM  Result Value Ref Range Status   Enterococcus faecalis NOT DETECTED NOT DETECTED Final   Enterococcus Faecium NOT DETECTED NOT DETECTED Final   Listeria monocytogenes NOT DETECTED NOT DETECTED Final   Staphylococcus species DETECTED (A) NOT DETECTED Final    Comment: CRITICAL RESULT CALLED TO, READ BACK BY AND VERIFIED WITH: G. Coffee PharmD 9:35 02/15/20 ([email or web address])    Staphylococcus aureus (BCID) NOT DETECTED NOT DETECTED Final   Staphylococcus epidermidis DETECTED (A) NOT DETECTED Final    Comment: CRITICAL RESULT CALLED TO, READ BACK BY AND VERIFIED WITH: G. Coffee PharmD 9:35 02/15/20 ([email or web address])    Staphylococcus lugdunensis NOT DETECTED NOT DETECTED Final    Streptococcus species NOT DETECTED NOT DETECTED Final   Streptococcus agalactiae NOT DETECTED NOT DETECTED Final   Streptococcus pneumoniae NOT DETECTED NOT DETECTED Final   Streptococcus pyogenes NOT DETECTED NOT DETECTED Final   A.calcoaceticus-baumannii NOT DETECTED NOT DETECTED Final   Bacteroides fragilis NOT DETECTED NOT DETECTED Final   Enterobacterales NOT DETECTED NOT DETECTED  Final   Enterobacter cloacae complex NOT DETECTED NOT DETECTED Final   Escherichia coli NOT DETECTED NOT DETECTED Final   Klebsiella aerogenes NOT DETECTED NOT DETECTED Final   Klebsiella oxytoca NOT DETECTED NOT DETECTED Final   Klebsiella pneumoniae NOT DETECTED NOT DETECTED Final   Proteus species NOT DETECTED NOT DETECTED Final   Salmonella species NOT DETECTED NOT DETECTED Final   Serratia marcescens NOT DETECTED NOT DETECTED Final   Haemophilus influenzae NOT DETECTED NOT DETECTED Final   Neisseria meningitidis NOT DETECTED NOT DETECTED Final   Pseudomonas aeruginosa NOT DETECTED NOT DETECTED Final   Stenotrophomonas maltophilia NOT DETECTED NOT DETECTED Final   Candida albicans NOT DETECTED NOT DETECTED Final   Candida auris NOT DETECTED NOT DETECTED Final   Candida glabrata NOT DETECTED NOT DETECTED Final   Candida krusei NOT DETECTED NOT DETECTED Final   Candida parapsilosis NOT DETECTED NOT DETECTED Final   Candida tropicalis NOT DETECTED NOT DETECTED Final   Cryptococcus neoformans/gattii NOT DETECTED NOT DETECTED Final   Methicillin resistance mecA/C NOT DETECTED NOT DETECTED Final    Comment: Performed at [facility], [place] [address] [place], [place] [address]  Culture, blood (Routine x 2)     Status: None (Preliminary result)   Collection Time: 02/13/20  5:44 PM   Specimen: Right Antecubital; Blood  Result Value Ref Range Status   Specimen Description RIGHT ANTECUBITAL  Final   Special Requests   Final    BOTTLES DRAWN AEROBIC AND ANAEROBIC Blood Culture adequate volume    Culture   Final    NO GROWTH 4 DAYS Performed at [facility], [address] [place], [place] [address]    Report Status PENDING  Incomplete  Culture, Urine     Status: None   Collection Time: 02/14/20  9:26 PM   Specimen: Urine, Clean Catch  Result Value Ref Range Status   Specimen Description   Final    URINE, CLEAN CATCH Performed at [facility], [address] [place], [place] [address]    Special Requests   Final    NONE Performed at [facility], [address] [place], [place] [address]    Culture   Final    NO GROWTH Performed at [facility], [place] [address], [place], [place] [address]    Report Status 02/16/2020 FINAL  Final  Culture, blood (routine x 2)     Status: None (Preliminary result)   Collection Time: 02/14/20  9:44 PM   Specimen: BLOOD RIGHT HAND  Result Value Ref Range Status   Specimen Description BLOOD RIGHT HAND  Final   Special Requests   Final    BOTTLES DRAWN AEROBIC ONLY Blood Culture adequate volume   Culture   Final    NO GROWTH 3 DAYS Performed at [facility], [address] [place], [place] [address]    Report Status PENDING  Incomplete  Culture, blood (routine x 2)     Status: None (Preliminary result)   Collection Time: 02/14/20  9:58 PM   Specimen: BLOOD LEFT HAND  Result Value Ref Range Status   Specimen Description BLOOD LEFT HAND  Final   Special Requests   Final    BOTTLES DRAWN AEROBIC ONLY Blood Culture adequate volume   Culture   Final    NO GROWTH 3 DAYS Performed at [facility], [address], [place],  [address]    Report Status PENDING  Incomplete  MRSA PCR Screening     Status: None  Collection Time: 02/15/20 12:05 AM   Specimen: Nasal Mucosa; Nasopharyngeal  Result Value Ref Range Status   MRSA by PCR NEGATIVE NEGATIVE Final    Comment:        The GeneXpert MRSA Assay (FDA approved for NASAL specimens only), is one component of a comprehensive MRSA colonization surveillance program. It is  not intended to diagnose MRSA infection nor to guide or monitor treatment for MRSA infections. Performed at [facility], [address] [place], [place] [address]   Culture, blood (Routine X 2) w Reflex to ID Panel     Status: None (Preliminary result)   Collection Time: 02/16/20  2:33 PM   Specimen: BLOOD RIGHT HAND  Result Value Ref Range Status   Specimen Description BLOOD RIGHT HAND  Final   Special Requests   Final    BOTTLES DRAWN AEROBIC AND ANAEROBIC Blood Culture adequate volume   Culture   Final    NO GROWTH < 24 HOURS Performed at [facility], [address], [place], [place] [address]    Report Status PENDING  Incomplete  Culture, blood (Routine X 2) w Reflex to ID Panel     Status: None (Preliminary result)   Collection Time: 02/16/20  2:33 PM   Specimen: BLOOD LEFT HAND  Result Value Ref Range Status   Specimen Description BLOOD LEFT HAND  Final   Special Requests   Final    BOTTLES DRAWN AEROBIC AND ANAEROBIC Blood Culture results may not be optimal due to an inadequate volume of blood received in culture bottles   Culture   Final    NO GROWTH < 24 HOURS Performed at [facility], [address] [place], [place] [address]    Report Status PENDING  Incomplete    Radiology Reports CT Head Wo Contrast  Result Date: 02/13/2020 CLINICAL DATA:  Unresponsive EXAM: CT HEAD WITHOUT CONTRAST TECHNIQUE: Contiguous axial images were obtained from the base of the skull through the vertex without intravenous contrast. COMPARISON:  06/29/2019 FINDINGS: Brain: There again noted changes consistent with prior encephalomalacia from ischemic event in the distribution of the right middle cerebral artery involving the right temporal and parietal lobes. The overall appearance is stable. No findings to suggest acute hemorrhage, acute infarction or space-occupying mass lesion are noted. Mild atrophic changes are again seen. Vascular: No hyperdense vessel or unexpected calcification. Skull:  Normal. Negative for fracture or focal lesion. Sinuses/Orbits: No acute finding. Other: None. 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 No acute abnormality noted. Electronically Signed   By: Inez Catalina M.D.   On: 02/13/2020 21:30   NM Pulmonary Perfusion  Result Date: 02/15/2020 CLINICAL DATA:  Respiratory failure EXAM: NUCLEAR MEDICINE PERFUSION LUNG SCAN TECHNIQUE: Perfusion images were obtained in multiple projections after intravenous injection of radiopharmaceutical. Ventilation scans intentionally deferred if perfusion scan and chest x-ray adequate for interpretation during COVID 19 epidemic. RADIOPHARMACEUTICALS:  4.4 mCi Tc-52mMAA IV COMPARISON:  Chest radiograph 02/15/2020 FINDINGS: Inhomogeneous tracer distribution in a nonsegmental pattern in both lungs, pattern favoring parenchymal lung disease. Tiny subsegmental defects at lung bases. Findings are not suspicious for pulmonary embolism. IMPRESSION: No scintigraphic evidence of pulmonary embolism. Electronically Signed   By: MLavonia DanaM.D.   On: 02/15/2020 12:40   UK[place]Venous Img Lower Unilateral Left (DVT)  Result Date: 02/15/2020 CLINICAL DATA:  Left lower extremity edema. EXAM: LEFT LOWER EXTREMITY VENOUS DOPPLER ULTRASOUND TECHNIQUE: Gray-scale sonography with graded compression, as well as color  Doppler and duplex ultrasound were performed to evaluate the lower extremity deep venous systems from the level of the common femoral vein and including the common femoral, femoral, profunda femoral, popliteal and calf veins including the posterior tibial, peroneal and gastrocnemius veins when visible. The superficial great saphenous vein was also interrogated. Spectral Doppler was utilized to evaluate flow at rest and with distal augmentation maneuvers in the common femoral, femoral and popliteal veins. COMPARISON:  None. FINDINGS: Contralateral Common Femoral Vein: Respiratory  phasicity is normal and symmetric with the symptomatic side. No evidence of thrombus. Normal compressibility. Common Femoral Vein: No evidence of thrombus. Normal compressibility, respiratory phasicity and response to augmentation. Saphenofemoral Junction: No evidence of thrombus. Normal compressibility and flow on color Doppler imaging. Profunda Femoral Vein: No evidence of thrombus. Normal compressibility and flow on color Doppler imaging. Femoral Vein: No evidence of thrombus. Normal compressibility, respiratory phasicity and response to augmentation. Popliteal Vein: No evidence of thrombus. Normal compressibility, respiratory phasicity and response to augmentation. Calf Veins: No evidence of thrombus. Normal compressibility and flow on color Doppler imaging. Superficial Great Saphenous Vein: No evidence of thrombus. Normal compressibility. Venous Reflux:  None. Other Findings: No evidence of superficial thrombophlebitis or abnormal fluid collection. IMPRESSION: No evidence of left lower extremity deep venous thrombosis. Electronically Signed   By: Aletta Edouard M.D.   On: 02/15/2020 10:32   DG CHEST PORT 1 VIEW  Result Date: 02/16/2020 CLINICAL DATA:  Recent aspiration, initial encounter EXAM: PORTABLE CHEST 1 VIEW COMPARISON:  02/15/2020 FINDINGS: Cardiac shadow is stable. The lungs are hyperinflated but clear. Gastric catheter is noted within the stomach. Endotracheal tube is seen extending to the level of the aortic arch in satisfactory position. No confluent infiltrate is noted. Mild vascular congestion remains. IMPRESSION: Stable vascular congestion.  No new focal abnormality is noted. Electronically Signed   By: Inez Catalina M.D.   On: 02/16/2020 16:04   Portable Chest xray  Result Date: 02/15/2020 CLINICAL DATA:  Acute respiratory failure. EXAM: PORTABLE CHEST 1 VIEW COMPARISON:  02/14/2020. FINDINGS: The ETT tip is stable above the carina. There is a nasogastric tube with tip and side port  below the GE junction. Heart size appears normal. No pleural effusion or airspace consolidation. Pulmonary vascular congestion noted. No overt edema. IMPRESSION: Pulmonary vascular congestion. Electronically Signed   By: Kerby Moors M.D.   On: 02/15/2020 05:41   DG Chest Portable 1 View  Result Date: 02/14/2020 CLINICAL DATA:  Check gastrostomy catheter EXAM: PORTABLE CHEST 1 VIEW COMPARISON:  02/14/2020 FINDINGS: Gastric catheter is been withdrawn and now lies with the tip in the stomach. Proximal side port is noted at the gastroesophageal junction. This could be advanced a few cm deeper into the stomach. IMPRESSION: Gastric catheter as described. Electronically Signed   By: Inez Catalina M.D.   On: 02/14/2020 21:19   DG Chest Portable 1 View  Result Date: 02/14/2020 CLINICAL DATA:  Hypoxia check gastric catheter placement EXAM: PORTABLE CHEST 1 VIEW COMPARISON:  02/14/2020 FINDINGS: Endotracheal tube and gastric catheter are again seen. The gastric catheter has been advanced and is now coiled within the stomach extending into the proximal duodenum. The lungs are hyperinflated consistent with COPD. No bony abnormality is noted. IMPRESSION: Gastric catheter as described. COPD without acute abnormality. Electronically Signed   By: Inez Catalina M.D.   On: 02/14/2020 21:14   DG Chest Portable 1 View  Result Date: 02/14/2020 CLINICAL DATA:  Intubated, loss of consciousness, COPD EXAM: PORTABLE CHEST 1  VIEW COMPARISON:  02/14/2020 FINDINGS: 2 frontal views of the chest demonstrate endotracheal tube overlying tracheal air column, tip approximately 3.8 cm above carina. Enteric catheter tip and side port project over the distal thoracic esophagus. Cardiac silhouette is unremarkable. Background emphysema again noted without airspace disease, effusion, or pneumothorax. No acute bony abnormalities. IMPRESSION: 1. Enteric catheter tip and side port projecting over the distal thoracic esophagus. Recommend advancing  at least 7 cm to ensure side port placement within the gastric lumen. 2. No complication after intubation. 3. Emphysema. Electronically Signed   By: Randa Ngo M.D.   On: 02/14/2020 18:40   DG Chest Portable 1 View  Result Date: 02/14/2020 CLINICAL DATA:  Shortness of breath, history of COPD EXAM: PORTABLE CHEST 1 VIEW COMPARISON:  02/13/2020 FINDINGS: Emphysema and chronic interstitial changes. No new consolidation or edema. No pleural effusion or pneumothorax. Stable cardiomediastinal contours. IMPRESSION: COPD without acute process in the chest. Electronically Signed   By: Macy Mis M.D.   On: 02/14/2020 17:32   DG Chest Portable 1 View  Result Date: 02/13/2020 CLINICAL DATA:  Hypotension. EXAM: PORTABLE CHEST 1 VIEW COMPARISON:  Prior chest radiographs 12/16/2019 and earlier. FINDINGS: Heart size within normal limits. Prior median sternotomy/CABG. Aortic atherosclerosis. Unchanged nonspecific chronic prominence of the interstitial lung markings. No appreciable airspace consolidation or frank pulmonary edema. No evidence of pleural effusion or pneumothorax. No acute bony abnormality identified. ACDF hardware within the lower cervical spine. IMPRESSION: No evidence of acute cardiopulmonary abnormality. Chronic nonspecific prominence of the interstitial lung markings, unchanged. Prior median sternotomy/CABG. Aortic Atherosclerosis (ICD10-I70.0). Electronically Signed   By: Kellie Simmering DO   On: 02/13/2020 18:15   ECHOCARDIOGRAM COMPLETE  Result Date: 02/16/2020    ECHOCARDIOGRAM REPORT   Patient Name:   ABDOULIE TIERCE Date of Exam: 02/16/2020 Medical Rec #:  [id number]         Height:       73.0 in Accession #:    [id number]        Weight:       190.3 lb Date of Birth:  27-Dec-1960         BSA:          2.106 m Patient Age:    63 years          BP:           93/64 mmHg Patient Gender: M                 HR:           52 bpm. Exam Location:  [facility] Procedure: 2D Echo Indications:     Bacteremia 790.7 / R78.81  History:        Patient has prior history of Echocardiogram examinations, most                 recent 06/01/2019. CAD, COPD; Risk Factors:Current Smoker and                 Dyslipidemia. Opioid dependence.  Sonographer:    Leavy Cella RDCS (AE) Referring Phys: [id number] Ason Heslin IMPRESSIONS  1. Left ventricular ejection fraction, by estimation, is 45 to 50%. The left ventricle has mildly decreased function. The left ventricle has no regional wall motion abnormalities. There is mild left ventricular hypertrophy. Left ventricular diastolic parameters are indeterminate.  2. Ventricular septum is flattened in systole consistent with RV pressure overload. . Right ventricular systolic function is severely reduced. The right ventricular size is severely  enlarged.  3. Right atrial size was moderately dilated.  4. The mitral valve is normal in structure. No evidence of mitral valve regurgitation. No evidence of mitral stenosis.  5. AV and LVOT Dopplers are off axis and likely underestimated. Visually there is no significant stenosis or regurgitation. . The aortic valve is tricuspid. Aortic valve regurgitation is not visualized. No aortic stenosis is present. FINDINGS  Left Ventricle: Left ventricular ejection fraction, by estimation, is 45 to 50%. The left ventricle has mildly decreased function. The left ventricle has no regional wall motion abnormalities. The left ventricular internal cavity size was normal in size. There is mild left ventricular hypertrophy. Left ventricular diastolic parameters are indeterminate. Right Ventricle: Ventricular septum is flattened in systole consistent with RV pressure overload. The right ventricular size is severely enlarged. Right vetricular wall thickness was not assessed. Right ventricular systolic function is severely reduced. Left Atrium: Left atrial size was normal in size. Right Atrium: Right atrial size was moderately dilated. Pericardium: There is  no evidence of pericardial effusion. Mitral Valve: The mitral valve is normal in structure. No evidence of mitral valve regurgitation. No evidence of mitral valve stenosis. Tricuspid Valve: The tricuspid valve is normal in structure. Tricuspid valve regurgitation is not demonstrated. No evidence of tricuspid stenosis. Aortic Valve: AV and LVOT Dopplers are off axis and likely underestimated. Visually there is no significant stenosis or regurgitation. The aortic valve is tricuspid. Aortic valve regurgitation is not visualized. No aortic stenosis is present. Pulmonic Valve: The pulmonic valve was not well visualized. Pulmonic valve regurgitation is not visualized. No evidence of pulmonic stenosis. Aorta: The aortic root is normal in size and structure. Pulmonary Artery: Indeterminant PASP, inadequate TR jet. Venous: IVC assessment for right atrial pressure unable to be performed due to mechanical ventilation. IAS/Shunts: No atrial level shunt detected by color flow Doppler.  LEFT VENTRICLE PLAX 2D LVIDd:         4.16 cm  Diastology LVIDs:         3.30 cm  LV e' medial:    6.31 cm/s LV PW:         1.35 cm  LV E/e' medial:  9.3 LV IVS:        1.00 cm  LV e' lateral:   7.51 cm/s LVOT diam:     1.90 cm  LV E/e' lateral: 7.8 LVOT Area:     2.84 cm  RIGHT VENTRICLE TAPSE (M-mode): 1.0 cm LEFT ATRIUM             Index       RIGHT ATRIUM           Index LA diam:        2.60 cm 1.23 cm/m  RA Area:     15.60 cm LA Vol (A2C):   23.8 ml 11.30 ml/m RA Volume:   46.60 ml  22.12 ml/m LA Vol (A4C):   26.7 ml 12.68 ml/m LA Biplane Vol: 26.0 ml 12.34 ml/m   AORTA Ao Root diam: 3.10 cm MITRAL VALVE               TRICUSPID VALVE MV Area (PHT): 3.37 cm    TR Peak grad:   17.0 mmHg MV Decel Time: 225 msec    TR Vmax:        206.00 cm/s MV E velocity: 58.70 cm/s MV A velocity: 42.40 cm/s  SHUNTS MV E/A ratio:  1.38        Systemic Diam: 1.90 cm Carlyle Dolly  MD Electronically signed by Carlyle Dolly MD Signature Date/Time:  02/16/2020/12:32:09 PM    Final    CBC Recent Labs  Lab 02/13/20 1652 02/14/20 1723 02/15/20 0142 02/16/20 0804 02/17/20 0601  WBC 8.8 19.6* 19.4* 18.1* 17.7*  HGB 16.8 17.1* 16.6 15.9 16.7  HCT 55.7* 58.5* 54.8* 48.9 51.2  PLT 270 352 213 226 240  MCV 94.2 96.4 93.5 86.4 87.1  MCH 28.4 28.2 28.3 28.1 28.4  MCHC 30.2 29.2* 30.3 32.5 32.6  RDW 19.6* 19.1* 19.3* 19.6* 19.6*  LYMPHSABS 0.6* 0.8  --   --   --   MONOABS 0.8 1.8*  --   --   --   EOSABS 0.0 0.0  --   --   --   BASOSABS 0.1 0.0  --   --   --     Chemistries  Recent Labs  Lab 02/13/20 1652 02/14/20 1723 02/15/20 0142 02/16/20 0804 02/17/20 0601  NA 133* 135 136 138 139  K 4.8 4.2 4.4 3.1* 4.1  CL 91* 91* 98 104 104  CO2 33* 32 '25 26 27  ' GLUCOSE 118* 145* 143* 128* 104*  BUN 27* 40* 41* 40* 38*  CREATININE 1.59* 1.85* 1.47* 1.06 0.96  CALCIUM 10.6* 10.6* 9.8 9.1 9.3  MG  --   --   --  1.9  --   AST 35 294*  --  490* 841*  ALT 23 292*  --  744* 559*  ALKPHOS 85 96  --  74 80  BILITOT 0.9 1.2  --  0.9 1.0    Recent Labs    02/14/20 2045  TRIG 82   Lab Results  Component Value Date   HGBA1C 6.9 (H) 02/15/2020   ------------------------------------------------------------------------------------------------------------------ No results for input(s): TSH, T4TOTAL, T3FREE, THYROIDAB in the last 72 hours.  Invalid input(s): FREET3 ------------------------------------------------------------------------------------------------------------------ No results for input(s): VITAMINB12, FOLATE, FERRITIN, TIBC, IRON, RETICCTPCT in the last 72 hours.  Coagulation profile Recent Labs  Lab 02/13/20 1652  INR 1.3*    No results for input(s): DDIMER in the last 72 hours.  Cardiac Enzymes No results for input(s): CKMB, TROPONINI, MYOGLOBIN in the last 168 hours.  Invalid input(s): CK ------------------------------------------------------------------------------------------------------------------     Component Value Date/Time   BNP 495.0 (H) 12/16/2019 1805    Roxan Hockey M.D on 02/17/2020 at 1:10 PM  Go to [email or web address] - for contact info  [facility] - Office  [phone]

## 2020-02-17 NOTE — Progress Notes (Addendum)
Progress Note  Patient Name: Antonio Ramirez Date of Encounter: 02/17/2020  [facility] Cardiologist: Kate Sable, MD (Inactive)   Subjective   No events overnight. Remains intubated and sedated.   Inpatient Medications    Scheduled Meds: . aspirin  81 mg Per Tube Daily  . chlorhexidine gluconate (MEDLINE KIT)  15 mL Mouth Rinse BID  . Chlorhexidine Gluconate Cloth  6 each Topical Daily  . clopidogrel  75 mg Oral Daily  . docusate  100 mg Oral BID  . enoxaparin (LOVENOX) injection  40 mg Subcutaneous Q24H  . hydrocortisone sodium succinate  50 mg Intravenous Q12H  . influenza vac split quadrivalent PF  0.5 mL Intramuscular Tomorrow-1000  . insulin aspart  0-5 Units Subcutaneous QHS  . insulin aspart  0-6 Units Subcutaneous TID WC  . mouth rinse  15 mL Mouth Rinse 10 times per day  . metoprolol tartrate  5 mg Intravenous Q6H  . pneumococcal 23 valent vaccine  0.5 mL Intramuscular Tomorrow-1000   Continuous Infusions: . sodium chloride    .  ceFAZolin (ANCEF) IV 2 g (02/17/20 6063)  . dextrose 5 % and 0.9% NaCl 50 mL/hr at 02/17/20 0635  . famotidine (PEPCID) IV 20 mg (02/17/20 0920)  . propofol (DIPRIVAN) infusion 20 mcg/kg/min (02/17/20 0923)   PRN Meds: Place/Maintain arterial line **AND** sodium chloride, acetaminophen **OR** acetaminophen, fentaNYL (SUBLIMAZE) injection, fentaNYL (SUBLIMAZE) injection, ipratropium-albuterol, ondansetron **OR** ondansetron (ZOFRAN) IV   Vital Signs    Vitals:   02/17/20 0615 02/17/20 0630 02/17/20 0645 02/17/20 0800  BP: 120/81 (!) 138/94 (!) 137/93   Pulse: 65 68 62   Resp: '14 14 14   ' Temp:    97.9 F (36.6 C)  TempSrc:      SpO2: 100% 100% 100%   Weight:  86.2 kg    Height:        Intake/Output Summary (Last 24 hours) at 02/17/2020 0933 Last data filed at 02/17/2020 0820 Gross per 24 hour  Intake 1830.87 ml  Output 2175 ml  Net -344.13 ml   Last 3 Weights 02/17/2020 02/16/2020 02/15/2020  Weight (lbs) 190 lb  0.6 oz 190 lb 4.1 oz 180 lb 8.9 oz  Weight (kg) 86.2 kg 86.3 kg 81.9 kg      Telemetry    SR - Personally Reviewed  ECG    n/a - Personally Reviewed  Physical Exam   GEN: No acute distress.   Neck: No JVD Cardiac: RRR, no murmurs, rubs, or gallops.  Respiratory: Clear to auscultation bilaterally. GI: Soft, nontender, non-distended  MS:1+ bilaterl LE edema; No deformity. Neuro:  Nonfocal  Psych: Normal affect   Labs    High Sensitivity Troponin:  No results for input(s): TROPONINIHS in the last 720 hours.    Chemistry Recent Labs  Lab 02/14/20 1723 02/14/20 1723 02/15/20 0142 02/16/20 0804 02/17/20 0601  NA 135   < > 136 138 139  K 4.2   < > 4.4 3.1* 4.1  CL 91*   < > 98 104 104  CO2 32   < > '25 26 27  ' GLUCOSE 145*   < > 143* 128* 104*  BUN 40*   < > 41* 40* 38*  CREATININE 1.85*   < > 1.47* 1.06 0.96  CALCIUM 10.6*   < > 9.8 9.1 9.3  PROT 7.0  --   --  5.3* 5.4*  ALBUMIN 3.5  --   --  2.6* 2.7*  AST 294*  --   --  490* 841*  ALT 292*  --   --  744* 559*  ALKPHOS 96  --   --  74 80  BILITOT 1.2  --   --  0.9 1.0  GFRNONAA 41*   < > 54* >60 >60  ANIONGAP 12   < > '13 8 8   ' < > = values in this interval not displayed.     Hematology Recent Labs  Lab 02/15/20 0142 02/16/20 0804 02/17/20 0601  WBC 19.4* 18.1* 17.7*  RBC 5.86* 5.66 5.88*  HGB 16.6 15.9 16.7  HCT 54.8* 48.9 51.2  MCV 93.5 86.4 87.1  MCH 28.3 28.1 28.4  MCHC 30.3 32.5 32.6  RDW 19.3* 19.6* 19.6*  PLT 213 226 240    BNPNo results for input(s): BNP, PROBNP in the last 168 hours.   DDimer No results for input(s): DDIMER in the last 168 hours.   Radiology    NM Pulmonary Perfusion  Result Date: 02/15/2020 CLINICAL DATA:  Respiratory failure EXAM: NUCLEAR MEDICINE PERFUSION LUNG SCAN TECHNIQUE: Perfusion images were obtained in multiple projections after intravenous injection of radiopharmaceutical. Ventilation scans intentionally deferred if perfusion scan and chest x-ray adequate for  interpretation during COVID 19 epidemic. RADIOPHARMACEUTICALS:  4.4 mCi Tc-11mMAA IV COMPARISON:  Chest radiograph 02/15/2020 FINDINGS: Inhomogeneous tracer distribution in a nonsegmental pattern in Ramirez lungs, pattern favoring parenchymal lung disease. Tiny subsegmental defects at lung bases. Findings are not suspicious for pulmonary embolism. IMPRESSION: No scintigraphic evidence of pulmonary embolism. Electronically Signed   By: MLavonia DanaM.D.   On: 02/15/2020 12:40   DG CHEST PORT 1 VIEW  Result Date: 02/16/2020 CLINICAL DATA:  Recent aspiration, initial encounter EXAM: PORTABLE CHEST 1 VIEW COMPARISON:  02/15/2020 FINDINGS: Cardiac shadow is stable. The lungs are hyperinflated but clear. Gastric catheter is noted within the stomach. Endotracheal tube is seen extending to the level of the aortic arch in satisfactory position. No confluent infiltrate is noted. Mild vascular congestion remains. IMPRESSION: Stable vascular congestion.  No new focal abnormality is noted. Electronically Signed   By: MInez CatalinaM.D.   On: 02/16/2020 16:04   ECHOCARDIOGRAM COMPLETE  Result Date: 02/16/2020    ECHOCARDIOGRAM REPORT   Patient Name:   Antonio MARENGODate of Exam: 02/16/2020 Medical Rec #:  [id number]        Height:       73.0 in Accession #:    [id number]       Weight:       190.3 lb Date of Birth:  [birth year]        BSA:          2.106 m Patient Age:    59years          BP:           93/64 mmHg Patient Gender: M                 HR:           52 bpm. [facility] Indications:    Bacteremia 790.7 / R78.81  History:        Patient has prior history of Echocardiogram examinations, most                 recent 06/01/2019. CAD, COPD; Risk Factors:Current Smoker and                 Dyslipidemia. Opioid dependence.  Sonographer:  Leavy Cella RDCS (AE) Referring Phys: [id number] COURAGE EMOKPAE IMPRESSIONS  1. Left ventricular ejection fraction, by estimation, is 45 to 50%. The  left ventricle has mildly decreased function. The left ventricle has no regional wall motion abnormalities. There is mild left ventricular hypertrophy. Left ventricular diastolic parameters are indeterminate.  2. Ventricular septum is flattened in systole consistent with RV pressure overload. . Right ventricular systolic function is severely reduced. The right ventricular size is severely enlarged.  3. Right atrial size was moderately dilated.  4. The mitral valve is normal in structure. No evidence of mitral valve regurgitation. No evidence of mitral stenosis.  5. AV and LVOT Dopplers are off axis and likely underestimated. Visually there is no significant stenosis or regurgitation. . The aortic valve is tricuspid. Aortic valve regurgitation is not visualized. No aortic stenosis is present. FINDINGS  Left Ventricle: Left ventricular ejection fraction, by estimation, is 45 to 50%. The left ventricle has mildly decreased function. The left ventricle has no regional wall motion abnormalities. The left ventricular internal cavity size was normal in size. There is mild left ventricular hypertrophy. Left ventricular diastolic parameters are indeterminate. Right Ventricle: Ventricular septum is flattened in systole consistent with RV pressure overload. The right ventricular size is severely enlarged. Right vetricular wall thickness was not assessed. Right ventricular systolic function is severely reduced. Left Atrium: Left atrial size was normal in size. Right Atrium: Right atrial size was moderately dilated. Pericardium: There is no evidence of pericardial effusion. Mitral Valve: The mitral valve is normal in structure. No evidence of mitral valve regurgitation. No evidence of mitral valve stenosis. Tricuspid Valve: The tricuspid valve is normal in structure. Tricuspid valve regurgitation is not demonstrated. No evidence of tricuspid stenosis. Aortic Valve: AV and LVOT Dopplers are off axis and likely underestimated.  Visually there is no significant stenosis or regurgitation. The aortic valve is tricuspid. Aortic valve regurgitation is not visualized. No aortic stenosis is present. Pulmonic Valve: The pulmonic valve was not well visualized. Pulmonic valve regurgitation is not visualized. No evidence of pulmonic stenosis. Aorta: The aortic root is normal in size and structure. Pulmonary Artery: Indeterminant PASP, inadequate TR jet. Venous: IVC assessment for right atrial pressure unable to be performed due to mechanical ventilation. IAS/Shunts: No atrial level shunt detected by color flow Doppler.  LEFT VENTRICLE PLAX 2D LVIDd:         4.16 cm  Diastology LVIDs:         3.30 cm  LV e' medial:    6.31 cm/s LV PW:         1.35 cm  LV E/e' medial:  9.3 LV IVS:        1.00 cm  LV e' lateral:   7.51 cm/s LVOT diam:     1.90 cm  LV E/e' lateral: 7.8 LVOT Area:     2.84 cm  RIGHT VENTRICLE TAPSE (M-mode): 1.0 cm LEFT ATRIUM             Index       RIGHT ATRIUM           Index LA diam:        2.60 cm 1.23 cm/m  RA Area:     15.60 cm LA Vol (A2C):   23.8 ml 11.30 ml/m RA Volume:   46.60 ml  22.12 ml/m LA Vol (A4C):   26.7 ml 12.68 ml/m LA Biplane Vol: 26.0 ml 12.34 ml/m   AORTA Ao Root diam: 3.10 cm MITRAL VALVE  TRICUSPID VALVE MV Area (PHT): 3.37 cm    TR Peak grad:   17.0 mmHg MV Decel Time: 225 msec    TR Vmax:        206.00 cm/s MV E velocity: 58.70 cm/s MV A velocity: 42.40 cm/s  SHUNTS MV E/A ratio:  1.38        Systemic Diam: 1.90 cm Carlyle Dolly MD Electronically signed by Carlyle Dolly MD Signature Date/Time: 02/16/2020/12:32:09 PM    Final     Cardiac Studies     Patient Profile     Antonio Ramirez is a 59 y.o. male with past medical history of CAD (s/p CABG in 2006 but details unavailable), HLD, COPD, tobacco use and substance use who is being seen today for the evaluation of arrhythmias at the request of Dr. Denton Brick.   Assessment & Plan    1. Tachycardia - transient episodes of  tachcyardia with his native RBBB. Regular and irregular at times, suspect some episodes of SVT and perhaps Afib in setting of SIRS/sepsis and respiratory failure on ventilator - started on low dose IV lopressor yesterday,appears  increased to 71m IV every 6 hours - if sustained arrhythmia would start IV amiodarone, with plans for overall short course. Drive for tachycardia should improve as systemic disease resolves - not enough afib burden to commit to anticoag  - from tele review arrhythmias have quieted down significantly, continue IV lopressor.   2. CAD - He is s/p CABG in 2006 but details unavailable. Currently intubated and sedated so unable to obtain ROS but no reports of chest pain at the time of admission by review of notes. Continue ASA and Plavix. Statin held given elevated LFT's.  - no acute issues  3. Chronic RV failure - 05/2019 echo moderate RV dysfunction, flattened septum consistent with RV pressure overload 02/2020 echo severe RV dysfunction, LVEF 45-50% in setting of acute systemic illness - with his COPD probable chronic cor pulmonale  - suspect some increased RV dysfunction in setting of respiratory failure/hypoxia and increased strain. Would limit IVFs in his sepsis protocol.BP's are stable. May require some gentle diuresis once sepsis has further resolved.   - bp's stable, signs of fluid overload with LE edema. Elevated LFTs could be sign of venous congestion - conservative diuresis, dose IV lasix 499mx 1. Redose again tomorrow pending bp's.     4. Elevated LFTs - possible shock liver in setting of sepsis - perhaps some hepatic congestion related to RV dysfunction as well.   5. COPD exacerbation - per pulmonary - remains intubated   For questions or updates, please contact [facility] consult [email or web address] for contact info under        Signed, BrCarlyle DollyMD  02/17/2020, 9:33 AM

## 2020-02-17 NOTE — Progress Notes (Signed)
Initial Nutrition Assessment  DOCUMENTATION CODES:   Not applicable  INTERVENTION:  If unable to extubate today, recommended initiating  -Vital 1.2 @ 20 ml/hr, advance 10 ml every 4 hours as tolerated to goal rate 60 ml/hr (1440 ml/day) with 45 ml Prosource BID  Regimen provides 1808 kcal (101% of estimated kcal needs), 130 grams of protein, and 1166 ml free water  Free water flushes per MD  Recommend monitoring magnesium, potassium, and phosphorus daily for at least 3 days, MD to replete as needed, as pt is at risk for refeeding syndrome given NPO on admission, unknown nutrition history.    NUTRITION DIAGNOSIS:   Inadequate oral intake related to inability to eat as evidenced by NPO status.  GOAL:   Patient will meet greater than or equal to 90% of their needs   MONITOR:   Labs, I & O's, Vent status, Diet advancement, Weight trends  REASON FOR ASSESSMENT:   Ventilator    ASSESSMENT:  RD working remotely.  59 year old male with history significant for chronic back pain, opiate dependence, COPD, CAD s/p bypass surgery presented with AMS after presenting the day prior with same then discharged home after AMS resolved and at baseline. Pt returned with decline in level of consciousness, work-up with significant respiratory acidosis, O2 sats 90% on 4L and placed on 15 L NRB. UDS positive for opiates and cannabinoids s/p  Narcan without improvement and required intubation for acute hypoxic and hypercarbic respiratory failure.  11/2-intubated 11/4-tachycardic w/ sedation vacation/weaning  Today, pt initially tolerating weaning trial, however became tachypneic and back on PRVC. Plans to continue weaning attempts, hopeful of extubation. If unable to extubate today, recommend starting tube feeds with regimen outlined above. Will continue to monitor.   Weights up ~7 lbs since admit, noted fluid overload, edema improving on IV Lasix, +1 BLE today. Will use admit wt (83.1 kg) for  estimating needs.  I/Os: +4337 ml since admit UOP: 1975 ml x 24 hrs  Patient is currently intubated on ventilator support MV: 5.9 L/min Temp (24hrs), Avg:98.2 F (36.8 C), Min:97.9 F (36.6 C), Max:98.8 F (37.1 C)  Propofol: stopped  Medications reviewed and include: Colace, SSI Drips: Ancef, Pepcid IVF: D5 NaCl @ 50 ml/hr (204 kcal)  Labs: CBGs 109,107,111,122, BUN 38 (H), WBC 17.7 (H), AST 841 (H), ALT 559 (H)     Per notes: -no evidence of VT -arrhythmia likely r/t sepsis/acute resp failure -volume overload, reduce IVF, cont IV Lasix -blood-tinged secretions from OGT, on IV Pepcid; stable H&H  NUTRITION - FOCUSED PHYSICAL EXAM: Unable to complete at this time, RD working remotely.  Diet Order:   Diet Order            Diet NPO time specified  Diet effective now                 EDUCATION NEEDS:   No education needs have been identified at this time  Skin:  Skin Assessment: Reviewed RN Assessment  Last BM:  pta  Height:   Ht Readings from Last 1 Encounters:  02/16/20 6\' 1"  (1.854 m)    Weight:   Wt Readings from Last 1 Encounters:  02/17/20 86.2 kg    BMI:  Body mass index is 25.07 kg/m.  Estimated Nutritional Needs:   Kcal:  9030  Protein:  125-142  Fluid:  >/= 1.7 L   Lajuan Lines, RD, LDN Clinical Nutrition After Hours/Weekend Pager # in [place]

## 2020-02-17 NOTE — Procedures (Signed)
Extubation Procedure Note Patient orally suctioned and ETT suctioned prior to extubation. Patient Details:   Name: Antonio Ramirez DOB: 12-20-1960 MRN: [id number]   Airway Documentation:    Vent end date: 02/17/2020 Vent end time: 1440  Evaluation  O2 sats:currently acceptable Complications: No apparent complications Patient did tolerate procedure well. Bilateral Breath Sounds: Clear, Diminished   Yes  Patient placed on  6L [place] post extubation. Per Dr.Wert maintain SpO2 88% or greater. BiPAP PRN.  Pete Pelt 02/17/2020, 2:51 PM

## 2020-02-17 NOTE — Progress Notes (Signed)
NAME:  Antonio Ramirez, MRN:  [id number], DOB:  1960/12/05, LOS: 3 ADMISSION DATE:  02/14/2020, CONSULTATION DATE:  11/3 REFERRING MD:  Emokpae/triad CHIEF COMPLAINT:  Vent dep resp failure   Brief History    35 yowm smoker with clinical evidence of copd and h/o drug abuse admitted with severe hypercarbic resp failure and intubated in ER p failed to respond to narcan pm 02/14/20 and PPCM service consulted 11/3 re vent dep/ hemodynamic instability.   History of present illness    per epic records: 59 y.o. male with medical history significant for opiate dependence, COPD, carotid artery disease, depression. Patient was brought to the ED reports of altered mental status.  He came 11/1 for same issue, but this resolved and was back to baseline hence was discharged home. History is obtained from chart review and talking to patient's significant other [name] on the phone. By report  patient level of consciousness started declining again.  He did not appear short of breath and did not complain of difficulty breathing.  No cough.  No pain with urination.  No known/ witnessed  abuse of his pain medications.  Report  that patient last took his long-acting oxycodone at 3 AM   11/2   and has not taken any since then.  He takes as needed oxycodone maybe once to twice a day, has not taken any 11/2.  He has chronic back pain.  No alcohol abuse. On EMS arrival at home, patient's O2 sats was 70% on room air.  Received 2 doses of [place] Vaccine- April/May.  ED Course:  O2 sats 90% on 4 L placed on 15 L nonrebreather.  T-max 100.9, heart rate initially one twenties improved ninety eighties, initial tachypnea to 34, blood pressure systolic 98 - 644, WBC 03.4.  Ammonia  66.  Creatinine elevated 1.85.  ABG showed pH of 7.1, PCO2 of 118, PO2 127.  Portable chest x-ray COPD without acute process.  UDS positive for opiates and cannabinoids.  2 mg of Narcan was given, with brief improvement in mental status, and then  patient vomited, additional Narcan given and patient was placed on Narcan drip, but mentation did not improve subsequently.  With altered mentation and significant respiratory acidosis, patient was intubated in the ED for acute hypoxic and hypercarbic respiratory failure.   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 aspirin  81 mg Per Tube Daily  . chlorhexidine gluconate (MEDLINE KIT)  15 mL Mouth Rinse BID  . Chlorhexidine Gluconate Cloth  6 each Topical Daily  . clopidogrel  75 mg Oral Daily  . docusate  100 mg Oral BID  . enoxaparin (LOVENOX) injection  40 mg Subcutaneous Q24H  . hydrocortisone sodium succinate  50 mg Intravenous Q12H  . influenza vac split quadrivalent PF  0.5 mL Intramuscular Tomorrow-1000  .  insulin aspart  0-5 Units Subcutaneous QHS  . insulin aspart  0-6 Units Subcutaneous TID WC  . mouth rinse  15 mL Mouth Rinse 10 times per day  . metoprolol tartrate  5 mg Intravenous Q6H  . pneumococcal 23 valent vaccine  0.5 mL Intramuscular Tomorrow-1000   Continuous Infusions: . sodium chloride    .  ceFAZolin (ANCEF) IV 2 g (02/17/20 1459)  . dextrose 5 % and 0.9% NaCl 50 mL/hr at 02/17/20 0635  . famotidine (PEPCID) IV 20 mg (02/17/20 0920)  . propofol (DIPRIVAN) infusion Stopped (02/17/20 1030)   PRN Meds:.Place/Maintain arterial line **AND** sodium chloride, acetaminophen **OR** acetaminophen, fentaNYL (SUBLIMAZE) injection, fentaNYL (SUBLIMAZE) injection,  ipratropium-albuterol, ondansetron **OR** ondansetron (ZOFRAN) IV   Interim history/subjective:  Extubated s difficulty, poor cough mechanics, poa at bedside conflicted re EOL issues but reports he is bedridden due to back pain and "there's no way he OD'd"    Objective   Blood pressure 132/78, pulse 91, temperature 98.8 F (37.1 C), temperature source Oral, resp. rate 17, height '6\' 1"'  (1.854 m), weight 86.2 kg, SpO2 96 %.    Vent Mode: CPAP FiO2 (%):  [30 %-40 %] 30 % Set Rate:  [14 bmp] 14 bmp Vt Set:  [650 mL] 650 mL PEEP:  [5 cmH20] 5 cmH20 Pressure Support:  [10 cmH20] 10 cmH20 Plateau Pressure:  [16 cmH20-23 cmH20] 16 cmH20   Intake/Output Summary (Last 24 hours) at 02/17/2020 1509 Last data filed at 02/17/2020 1300 Gross per 24 hour  Intake 2176.63 ml  Output 2675 ml  Net -498.37 ml   Filed Weights   02/15/20 0300 02/16/20 0343 02/17/20 0630  Weight: 81.9 kg 86.3 kg 86.2 kg    Examination: Tmax   98.8  Pt somnolent but responds to verbal, will cough to req No jvd Oropharynx mucosa dry Neck supple Lungs with disant bs bilaterally/ prolonged exp  RRR no s3 or or sign murmur Abd obese with limited excursion  Extr warm with no edema or clubbing noted        I personally reviewed images and agree with radiology impression as follows:  CXR:   11/4   Stable vascular congestion.  No new focal abnormality is noted.   Resolved Hospital Problem list      Assessment & Plan:  1) vent dep acute hypercabic, hypoxemic resp failure s/p apparent drug od in pt with underlying copd/ still smoking ? Severity > successfully extubated  NB Though somewhat paradoxic, when the lung fails to clear C02 properly and pC02 rises the lung then becomes a more efficient scavenger of C02 allowing lower work of breathing and  better C02 clearance albeit at a higher serum pC02 level - this is why pts can look a lot better than their ABG's would suggest and why it's so difficult to  prognosticate endstage dz.  It's also why I strongly rec DNI status (ventilating pts down to a nl pC02 adversely affects this compensatory mechanism)  I advised POA that he was lucky this time but is likely looking at life on vent soon if continues to disregard medical advice and may need to change over to hospice at some point once accepts ncb status  In meantime has been discharged from our practice so we will see prn emergency settings only   2) Hypotension resolved ? Septic  - resolved  >> abx per triad, changed to stress Hydrocortisone as really not wheezing so no need for solumedrol  >> vol expand as needed  if no edema on cxr pending   3)  Mild AKI ? Due to hypotension Lab Results  Component Value Date   CREATININE 0.96 02/17/2020   CREATININE 1.06 02/16/2020   CREATININE 1.47 (H) 02/15/2020   >> resolved     4) LFT's elevated ? Shock liver?  - no witnessed tylenol od with level at last admit undectable  - still trending up 11/5 ? Why and alb down to 2.6 c/w protein cal malnutrition   5)  Cor pulmonale by Echo this admit with neg perfusion scan ruling out [facility] Who III so rx is treat the underlying condition (COPD) and make sure 02 sats stay > 90% at all times  Best practice:  Diet: per triad Pain/Anxiety/Delirium protocol (if indicated): per triad VAP protocol (if indicated):  DVT prophylaxis:  lovenox GI prophylaxis: pepcid Glucose control: per triad  Mobility: up as tol  Code Status: per triad  Family Communication: to POA this pm/ advised re need to see non-[place] Pulmonologist in f/u as been discharged from our practice  Disposition: ICU  Labs   CBC: Recent Labs  Lab 02/13/20 1652 02/14/20 1723 02/15/20 0142 02/16/20 0804 02/17/20 0601  WBC 8.8 19.6* 19.4* 18.1* 17.7*  NEUTROABS 7.2 16.8*  --   --   --   HGB 16.8 17.1* 16.6 15.9 16.7  HCT 55.7* 58.5* 54.8* 48.9 51.2  MCV 94.2 96.4 93.5 86.4 87.1  PLT 270 352 213 226 696    Basic Metabolic Panel: Recent  Labs  Lab 02/13/20 1652 02/14/20 1723 02/15/20 0142 02/16/20 0804 02/17/20 0601  NA 133* 135 136 138 139  K 4.8 4.2 4.4 3.1* 4.1  CL 91* 91* 98 104 104  CO2 33* 32 '25 26 27  ' GLUCOSE 118* 145* 143* 128* 104*  BUN 27* 40* 41* 40* 38*  CREATININE 1.59* 1.85* 1.47* 1.06 0.96  CALCIUM 10.6* 10.6* 9.8 9.1 9.3  MG  --   --   --  1.9  --    GFR: Estimated Creatinine Clearance: 92.5 mL/min (by C-G formula based on SCr of 0.96 mg/dL). Recent Labs  Lab 02/13/20 1652 02/13/20 1652 02/13/20 1949 02/14/20 1723 02/14/20 2158 02/15/20 0142 02/16/20 0804 02/17/20 0601  WBC 8.8   < >  --  19.6*  --  19.4* 18.1* 17.7*  LATICACIDVEN 2.1*  --  1.3  --  4.6* 3.1*  --   --    < > = values in this interval not displayed.    Liver Function Tests: Recent Labs  Lab 02/13/20 1652 02/14/20 1723 02/16/20 0804 02/17/20 0601  AST 35 294* 490* 841*  ALT 23 292* 744* 559*  ALKPHOS 85 96 74 80  BILITOT 0.9 1.2 0.9 1.0  PROT 6.9 7.0 5.3* 5.4*  ALBUMIN 3.5 3.5 2.6* 2.7*   No results for input(s): LIPASE, AMYLASE in the last 168 hours. Recent Labs  Lab 02/14/20 1723  AMMONIA 66*    ABG    Component Value Date/Time   PHART 7.403 02/15/2020 1710   PCO2ART 45.8 02/15/2020 1710   PO2ART 95.9 02/15/2020 1710   HCO3 27.0 02/15/2020 1710   O2SAT 97.0 02/15/2020 1710     Coagulation Profile: Recent Labs  Lab 02/13/20 1652  INR 1.3*    Cardiac Enzymes: No results for input(s): CKTOTAL, CKMB, CKMBINDEX, TROPONINI in the last 168 hours.  HbA1C: Hgb A1c MFr Bld  Date/Time Value Ref Range Status  02/15/2020 01:42 AM 6.9 (H) 4.8 - 5.6 % Final  Comment:    (NOTE) Pre diabetes:          5.7%-6.4%  Diabetes:              >6.4%  Glycemic control for   <7.0% adults with diabetes   06/01/2019 07:34 AM 6.2 (H) 4.8 - 5.6 % Final    Comment:    (NOTE) Pre diabetes:          5.7%-6.4% Diabetes:              >6.4% Glycemic control for   <7.0% adults with diabetes     CBG: Recent  Labs  Lab 02/16/20 1130 02/16/20 1658 02/16/20 2054 02/17/20 0739 02/17/20 1134  GLUCAP 121* 122* 111* 107* 109*     Christinia Gully, MD Pulmonary and [place] [phone]   After 7:00 pm call Elink  [phone]

## 2020-02-18 ENCOUNTER — Inpatient Hospital Stay (HOSPITAL_COMMUNITY): Payer: Medicare Other

## 2020-02-18 DIAGNOSIS — J441 Chronic obstructive pulmonary disease with (acute) exacerbation: Secondary | ICD-10-CM | POA: Diagnosis not present

## 2020-02-18 DIAGNOSIS — J9601 Acute respiratory failure with hypoxia: Secondary | ICD-10-CM | POA: Diagnosis not present

## 2020-02-18 DIAGNOSIS — I25810 Atherosclerosis of coronary artery bypass graft(s) without angina pectoris: Secondary | ICD-10-CM | POA: Diagnosis not present

## 2020-02-18 DIAGNOSIS — F339 Major depressive disorder, recurrent, unspecified: Secondary | ICD-10-CM

## 2020-02-18 LAB — GLUCOSE, CAPILLARY
Glucose-Capillary: 108 mg/dL — ABNORMAL HIGH (ref 70–99)
Glucose-Capillary: 83 mg/dL (ref 70–99)
Glucose-Capillary: 86 mg/dL (ref 70–99)
Glucose-Capillary: 89 mg/dL (ref 70–99)
Glucose-Capillary: 97 mg/dL (ref 70–99)

## 2020-02-18 LAB — BRAIN NATRIURETIC PEPTIDE: B Natriuretic Peptide: 867 pg/mL — ABNORMAL HIGH (ref 0.0–100.0)

## 2020-02-18 NOTE — Progress Notes (Signed)
Patient Demographics:    Antonio Ramirez, is a 59 y.o. male, DOB - January 24, 1961, EQA:[id number]  Admit date - 02/14/2020   Admitting Physician Ejiroghene Arlyce Dice, MD  Outpatient Primary MD for the patient is [name], DO  LOS - 4   Chief Complaint  Patient presents with  . Altered Mental Status        Subjective:    Antonio Ramirez is somewhat sleepy - 02/18/20 -Sleepy today apparently received lorazepam overnight for agitation -Chest x-ray and CT head pending -  Assessment  & Plan :    Active Problems:   Acute respiratory failure with hypoxia (HCC)   Endotracheally intubated   Opioid dependence ([place])   COPD with acute exacerbation (HCC)   Depression   CAD (coronary artery disease)   Altered mental status  Brief Summary:- a58 y.o.malewith a history of COPD, ongoing tobacco use, CAD s/p CABG, carotid artery disease,  chronic back pain with opioid dependence, and opioid overdose as well as chronic depression admitted on 02/14/2020 with acute hypoxic respiratory failure requiring intubation and mechanical ventilation- -Patient had a similar presentation in March 2021 when he was intubated for altered mentation subsequently did okay was extubated -Extubated 02/17/2020  A/p 1)Acute hypoxic and hypercapnic respiratory failure--- initially thought to be from a combination of opiate abuse and COPD- pulmonary consult from Dr. Melvyn Novas appreciated --ABG improved--specifically respiratory acidosis has resolved -VQ scan negative for PE -Lower extremity venous Dopplers without acute DVT --Chest x-ray on 02/15/2020 and 02/16/20 with possible pulm venous congestion-treated with Lasix -Patient was successfully extubated on 02/17/20  -02/18/20--remains quite lethargic, chest x-ray and CT head requested   2)SIRS-- meets SIRS criteria, sepsis Not Confirmed at this time as source of infection yet to be  identified ---There is suspicion for possible sepsis with preliminary blood cultures from 02/13/2020 with staph epi-contaminant Versus infection -Continue IV Ancef, repeat blood cultures from 02/14/2020 and 02/16/2020--- NGTD  3)Staph Epi bacteremia and possible severe sepsis--- please see #2 above --Episodes of hemodynamic instability persist with erratic blood pressures --Transthoracic echocardiogram without evidence for endocarditis, --WBC 19.4>>18.1>>17.7--patient is on steroids -Fevers resolved -  4) acute COPD exacerbation--- bronchodilators and steroids as ordered, Dr Melvyn Novas switched patient to hydrocortisone from Solu-Medrol due to hemodynamic instability  5)CAD---s/p CABG 2006 and PVD--- -c/n aspirin and Plavix -Echo with EF of 40 to 45% which is down from 55 to 60% back in February 2021  - 6)AKI----acute kidney injury on CKD stage -#B --   creatinine is down to 0.9 (peak was 1.85)  ,  baseline creatinine = 1.5   renally adjust medications, avoid nephrotoxic agents / dehydration  / hypotension -AKI resolved with hydration  7)Social/Ethics--plan of care and advanced directive discussed with patient ex-wife who is also his POA--patient is a full code with full scope of treatment  8)FEN--patient is n.p.o (intubated) ., continue dextrose solution, check CBGs and adjust sliding scale  9)HFrEF--Tachyarrhythmia--- patient alternating between SVT and A. fib with RVR--Cardiology consult appreciated, need to replace potassium, echo with reduced EF (45 to 50%) --Cardiology recommends against anticoagulation at this time given low A. fib burden -Elevated BNP noted, Lasix and metoprolol as ordered  Disposition/Need for in-Hospital Stay- patient unable to be discharged at this time due  to -remains hypoxic and lethargic further work-up in progress - Status is: Inpatient  Remains inpatient appropriate because:remains hypoxic and lethargic further work-up in progress   Disposition: The  patient is from: Home              Anticipated d/c is to: Home              Anticipated d/c date is: > 3 days              Patient currently is not medically stable to d/c. Barriers: Not Clinically Stable- remains hypoxic and lethargic further work-up in progress  Code Status : full  Procedures:- Intubated 02/14/2020  -Extubated 02/17/2020  Family Communication:   Discussed with patient's ex- wife/POA  Consults  :  PCCM  DVT Prophylaxis  :  Lovenox -  - SCDs    Lab Results  Component Value Date   PLT 240 02/17/2020    Inpatient Medications  Scheduled Meds: . aspirin  81 mg Per Tube Daily  . chlorhexidine gluconate (MEDLINE KIT)  15 mL Mouth Rinse BID  . Chlorhexidine Gluconate Cloth  6 each Topical Daily  . clopidogrel  75 mg Oral Daily  . docusate  100 mg Oral BID  . enoxaparin (LOVENOX) injection  40 mg Subcutaneous Q24H  . hydrocortisone sodium succinate  50 mg Intravenous Q12H  . influenza vac split quadrivalent PF  0.5 mL Intramuscular Tomorrow-1000  . insulin aspart  0-5 Units Subcutaneous QHS  . insulin aspart  0-6 Units Subcutaneous TID WC  . mouth rinse  15 mL Mouth Rinse 10 times per day  . metoprolol tartrate  25 mg Oral TID  . pneumococcal 23 valent vaccine  0.5 mL Intramuscular Tomorrow-1000  . traZODone  100 mg Oral QHS   Continuous Infusions: .  ceFAZolin (ANCEF) IV 2 g (02/18/20 0546)  . dextrose 5 % and 0.9% NaCl 50 mL/hr at 02/17/20 0635  . famotidine (PEPCID) IV 20 mg (02/18/20 0946)   PRN Meds:.acetaminophen **OR** acetaminophen, diphenhydrAMINE, ipratropium-albuterol, LORazepam, ondansetron **OR** ondansetron (ZOFRAN) IV, oxyCODONE    Anti-infectives (From admission, onward)   Start     Dose/Rate Route Frequency Ordered Stop   02/15/20 2200  vancomycin (VANCOCIN) IVPB 1000 mg/200 mL premix  Status:  Discontinued        1,000 mg 200 mL/hr over 60 Minutes Intravenous Every 24 hours 02/14/20 2149 02/15/20 1044   02/15/20 1130  ceFAZolin (ANCEF)  IVPB 2g/100 mL premix        2 g 200 mL/hr over 30 Minutes Intravenous Every 8 hours 02/15/20 1044     02/14/20 2200  metroNIDAZOLE (FLAGYL) IVPB 500 mg  Status:  Discontinued        500 mg 100 mL/hr over 60 Minutes Intravenous Every 8 hours 02/14/20 2136 02/15/20 1433   02/14/20 2200  vancomycin (VANCOREADY) IVPB 1250 mg/250 mL        1,250 mg 166.7 mL/hr over 90 Minutes Intravenous  Once 02/14/20 2148 02/15/20 0128   02/14/20 2200  ceFEPIme (MAXIPIME) 2 g in sodium chloride 0.9 % 100 mL IVPB  Status:  Discontinued        2 g 200 mL/hr over 30 Minutes Intravenous Every 12 hours 02/14/20 2148 02/15/20 1044       Objective:   Vitals:   02/18/20 1100 02/18/20 1123 02/18/20 1200 02/18/20 1300  BP: (!) 145/108  (!) 168/84 (!) 155/92  Pulse: 83 93 90 85  Resp: (!) 27 (!) 21 (!) 23 (!)  21  Temp:  98.7 F (37.1 C)    TempSrc:  Oral    SpO2: 97% (!) 89% 95% 94%  Weight:      Height:        Wt Readings from Last 3 Encounters:  02/17/20 86.2 kg  02/13/20 70.3 kg  12/16/19 80.3 kg    Intake/Output Summary (Last 24 hours) at 02/18/2020 1406 Last data filed at 02/18/2020 0800 Gross per 24 hour  Intake 1095.07 ml  Output 1750 ml  Net -654.93 ml   Physical Exam Gen:-Somewhat sleepy, not very verbal  HEENT:-Normocephalic atraumatic, no scleral icterus  Neck- Right IJ central line  Lungs--somewhat diminished, no wheezing  CV- S1, S2 normal, regular , CABG scar Abd-  +ve B.Sounds, Abd Soft, not distended,    Extremity/Skin:- No  edema, pedal pulses present  Neuro-Psych-Limited exam as patient is lethargic  GU-Foley in situ ---adequate urine output   Data Review:   Micro Results Recent Results (from the past 240 hour(s))  Respiratory Panel by RT PCR (Flu A&B, Covid) - Nasopharyngeal Swab     Status: None   Collection Time: 02/13/20  4:51 PM   Specimen: Nasopharyngeal Swab  Result Value Ref Range Status   SARS Coronavirus 2 by RT PCR NEGATIVE NEGATIVE Final    Comment:  (NOTE) SARS-CoV-2 target nucleic acids are NOT DETECTED.  The SARS-CoV-2 RNA is generally detectable in upper respiratoy specimens during the acute phase of infection. The lowest concentration of SARS-CoV-2 viral copies this assay can detect is 131 copies/mL. A negative result does not preclude SARS-Cov-2 infection and should not be used as the sole basis for treatment or other patient management decisions. A negative result may occur with  improper specimen collection/handling, submission of specimen other than nasopharyngeal swab, presence of viral mutation(s) within the areas targeted by this assay, and inadequate number of viral copies (<131 copies/mL). A negative result must be combined with clinical observations, patient history, and epidemiological information. The expected result is Negative.  Fact Sheet for Patients:  PinkCheek.be  Fact Sheet for Healthcare Providers:  GravelBags.it  This test is no t yet approved or cleared by the [place] FDA and  has been authorized for detection and/or diagnosis of SARS-CoV-2 by FDA under an Emergency Use Authorization (EUA). This EUA will remain  in effect (meaning this test can be used) for the duration of the COVID-19 declaration under Section 564(b)(1) of the Act, 21 U.S.C. section 360bbb-3(b)(1), unless the authorization is terminated or revoked sooner.     Influenza A by PCR NEGATIVE NEGATIVE Final   Influenza B by PCR NEGATIVE NEGATIVE Final    Comment: (NOTE) The Xpert Xpress SARS-CoV-2/FLU/RSV assay is intended as an aid in  the diagnosis of influenza from Nasopharyngeal swab specimens and  should not be used as a sole basis for treatment. Nasal washings and  aspirates are unacceptable for Xpert Xpress SARS-CoV-2/FLU/RSV  testing.  Fact Sheet for Patients: PinkCheek.be  Fact Sheet for Healthcare  Providers: GravelBags.it  This test is not yet approved or cleared by the [place] FDA and  has been authorized for detection and/or diagnosis of SARS-CoV-2 by  FDA under an Emergency Use Authorization (EUA). This EUA will remain  in effect (meaning this test can be used) for the duration of the  Covid-19 declaration under Section 564(b)(1) of the Act, 21  U.S.C. section 360bbb-3(b)(1), unless the authorization is  terminated or revoked. Performed at [facility], [address] [place], [place] [address]  Culture, blood (Routine x 2)     Status: Abnormal   Collection Time: 02/13/20  4:52 PM   Specimen: Right Antecubital; Blood  Result Value Ref Range Status   Specimen Description   Final    RIGHT ANTECUBITAL Performed at [facility], [address], [place], [place] [address]    Special Requests   Final    BOTTLES DRAWN AEROBIC AND ANAEROBIC Blood Culture adequate volume Performed at [facility], [address], [place], [place] [address]    Culture  Setup Time   Final    GRAM POSITIVE COCCI AEROBIC BOTTLE ONLY CRITICAL RESULT CALLED TO, READ BACK BY AND VERIFIED WITH: G. Coffee PharmD 9:35 02/15/20 ([email or web address])    Culture (A)  Final    STAPHYLOCOCCUS EPIDERMIDIS THE SIGNIFICANCE OF ISOLATING THIS ORGANISM FROM A SINGLE SET OF BLOOD CULTURES WHEN MULTIPLE SETS ARE DRAWN IS UNCERTAIN. PLEASE NOTIFY THE MICROBIOLOGY DEPARTMENT WITHIN ONE WEEK IF SPECIATION AND SENSITIVITIES ARE REQUIRED. Performed at [facility], [place] [address] [place], [place] [address]    Report Status 02/16/2020 FINAL  Final  Blood Culture ID Panel (Reflexed)     Status: Abnormal   Collection Time: 02/13/20  4:52 PM  Result Value Ref Range Status   Enterococcus faecalis NOT DETECTED NOT DETECTED Final   Enterococcus Faecium NOT DETECTED NOT DETECTED Final   Listeria monocytogenes NOT DETECTED NOT DETECTED Final   Staphylococcus species DETECTED (A) NOT DETECTED Final     Comment: CRITICAL RESULT CALLED TO, READ BACK BY AND VERIFIED WITH: G. Coffee PharmD 9:35 02/15/20 (wilsonm)    Staphylococcus aureus (BCID) NOT DETECTED NOT DETECTED Final   Staphylococcus epidermidis DETECTED (A) NOT DETECTED Final    Comment: CRITICAL RESULT CALLED TO, READ BACK BY AND VERIFIED WITH: G. Coffee PharmD 9:35 02/15/20 (wilsonm)    Staphylococcus lugdunensis NOT DETECTED NOT DETECTED Final   Streptococcus species NOT DETECTED NOT DETECTED Final   Streptococcus agalactiae NOT DETECTED NOT DETECTED Final   Streptococcus pneumoniae NOT DETECTED NOT DETECTED Final   Streptococcus pyogenes NOT DETECTED NOT DETECTED Final   A.calcoaceticus-baumannii NOT DETECTED NOT DETECTED Final   Bacteroides fragilis NOT DETECTED NOT DETECTED Final   Enterobacterales NOT DETECTED NOT DETECTED Final   Enterobacter cloacae complex NOT DETECTED NOT DETECTED Final   Escherichia coli NOT DETECTED NOT DETECTED Final   Klebsiella aerogenes NOT DETECTED NOT DETECTED Final   Klebsiella oxytoca NOT DETECTED NOT DETECTED Final   Klebsiella pneumoniae NOT DETECTED NOT DETECTED Final   Proteus species NOT DETECTED NOT DETECTED Final   Salmonella species NOT DETECTED NOT DETECTED Final   Serratia marcescens NOT DETECTED NOT DETECTED Final   Haemophilus influenzae NOT DETECTED NOT DETECTED Final   Neisseria meningitidis NOT DETECTED NOT DETECTED Final   Pseudomonas aeruginosa NOT DETECTED NOT DETECTED Final   Stenotrophomonas maltophilia NOT DETECTED NOT DETECTED Final   Candida albicans NOT DETECTED NOT DETECTED Final   Candida auris NOT DETECTED NOT DETECTED Final   Candida glabrata NOT DETECTED NOT DETECTED Final   Candida krusei NOT DETECTED NOT DETECTED Final   Candida parapsilosis NOT DETECTED NOT DETECTED Final   Candida tropicalis NOT DETECTED NOT DETECTED Final   Cryptococcus neoformans/gattii NOT DETECTED NOT DETECTED Final   Methicillin resistance mecA/C NOT DETECTED NOT DETECTED Final     Comment: Performed at [facility], [address] [place], [place] [address]  Culture, blood (Routine x 2)     Status: None (Preliminary result)   Collection Time:  02/13/20  5:44 PM   Specimen: Right Antecubital; Blood  Result Value Ref Range Status   Specimen Description RIGHT ANTECUBITAL  Final   Special Requests   Final    BOTTLES DRAWN AEROBIC AND ANAEROBIC Blood Culture adequate volume   Culture   Final    NO GROWTH 4 DAYS Performed at [facility], [address] [place], [place] [address]    Report Status PENDING  Incomplete  Culture, Urine     Status: None   Collection Time: 02/14/20  9:26 PM   Specimen: Urine, Clean Catch  Result Value Ref Range Status   Specimen Description   Final    URINE, CLEAN CATCH Performed at [facility], [address], [place], [place] [address]    Special Requests   Final    NONE Performed at [facility], [address], [place], [place] [address]    Culture   Final    NO GROWTH Performed at [facility], [place] [address], [place], [place] [address]    Report Status 02/16/2020 FINAL  Final  Culture, blood (routine x 2)     Status: None (Preliminary result)   Collection Time: 02/14/20  9:44 PM   Specimen: BLOOD RIGHT HAND  Result Value Ref Range Status   Specimen Description BLOOD RIGHT HAND  Final   Special Requests   Final    BOTTLES DRAWN AEROBIC ONLY Blood Culture adequate volume   Culture   Final    NO GROWTH 3 DAYS Performed at [facility], [address] [place], [place] [address]    Report Status PENDING  Incomplete  Culture, blood (routine x 2)     Status: None (Preliminary result)   Collection Time: 02/14/20  9:58 PM   Specimen: BLOOD LEFT HAND  Result Value Ref Range Status   Specimen Description BLOOD LEFT HAND  Final   Special Requests   Final    BOTTLES DRAWN AEROBIC ONLY Blood Culture adequate volume   Culture   Final    NO GROWTH 3 DAYS Performed at [facility], [address] [place], [place]  [address]    Report Status PENDING  Incomplete  MRSA PCR Screening     Status: None   Collection Time: 02/15/20 12:05 AM   Specimen: Nasal Mucosa; Nasopharyngeal  Result Value Ref Range Status   MRSA by PCR NEGATIVE NEGATIVE Final    Comment:        The GeneXpert MRSA Assay (FDA approved for NASAL specimens only), is one component of a comprehensive MRSA colonization surveillance program. It is not intended to diagnose MRSA infection nor to guide or monitor treatment for MRSA infections. Performed at [facility], [address], [place], [place] [address]   Culture, blood (Routine X 2) w Reflex to ID Panel     Status: None (Preliminary result)   Collection Time: 02/16/20  2:33 PM   Specimen: BLOOD RIGHT HAND  Result Value Ref Range Status   Specimen Description BLOOD RIGHT HAND  Final   Special Requests   Final    BOTTLES DRAWN AEROBIC AND ANAEROBIC Blood Culture adequate volume   Culture   Final    NO GROWTH < 24 HOURS Performed at [facility], [address], [place], [place] [address]    Report Status PENDING  Incomplete  Culture, blood (Routine X 2) w Reflex to ID Panel     Status: None (Preliminary result)   Collection Time: 02/16/20  2:33 PM   Specimen: BLOOD  LEFT HAND  Result Value Ref Range Status   Specimen Description BLOOD LEFT HAND  Final   Special Requests   Final    BOTTLES DRAWN AEROBIC AND ANAEROBIC Blood Culture results may not be optimal due to an inadequate volume of blood received in culture bottles   Culture   Final    NO GROWTH < 24 HOURS Performed at [facility], [address] [place], [place] [address]    Report Status PENDING  Incomplete   Radiology Reports CT Head Wo Contrast  Result Date: 02/13/2020 CLINICAL DATA:  Unresponsive EXAM: CT HEAD WITHOUT CONTRAST TECHNIQUE: Contiguous axial images were obtained from the base of the skull through the vertex without intravenous contrast. COMPARISON:  06/29/2019 FINDINGS: Brain: There again noted  changes consistent with prior encephalomalacia from ischemic event in the distribution of the right middle cerebral artery involving the right temporal and parietal lobes. The overall appearance is stable. No findings to suggest acute hemorrhage, acute infarction or space-occupying mass lesion are noted. Mild atrophic changes are again seen. Vascular: No hyperdense vessel or unexpected calcification. Skull: Normal. Negative for fracture or focal lesion. Sinuses/Orbits: No acute finding. Other: None. 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 No acute abnormality noted. Electronically Signed   By: Inez Catalina M.D.   On: 02/13/2020 21:30   NM Pulmonary Perfusion  Result Date: 02/15/2020 CLINICAL DATA:  Respiratory failure EXAM: NUCLEAR MEDICINE PERFUSION LUNG SCAN TECHNIQUE: Perfusion images were obtained in multiple projections after intravenous injection of radiopharmaceutical. Ventilation scans intentionally deferred if perfusion scan and chest x-ray adequate for interpretation during COVID 19 epidemic. RADIOPHARMACEUTICALS:  4.4 mCi Tc-81mMAA IV COMPARISON:  Chest radiograph 02/15/2020 FINDINGS: Inhomogeneous tracer distribution in a nonsegmental pattern in both lungs, pattern favoring parenchymal lung disease. Tiny subsegmental defects at lung bases. Findings are not suspicious for pulmonary embolism. IMPRESSION: No scintigraphic evidence of pulmonary embolism. Electronically Signed   By: MLavonia DanaM.D.   On: 02/15/2020 12:40   UK[place]Venous Img Lower Unilateral Left (DVT)  Result Date: 02/15/2020 CLINICAL DATA:  Left lower extremity edema. EXAM: LEFT LOWER EXTREMITY VENOUS DOPPLER ULTRASOUND TECHNIQUE: Gray-scale sonography with graded compression, as well as color Doppler and duplex ultrasound were performed to evaluate the lower extremity deep venous systems from the level of the common femoral vein and including the common femoral, femoral,  profunda femoral, popliteal and calf veins including the posterior tibial, peroneal and gastrocnemius veins when visible. The superficial great saphenous vein was also interrogated. Spectral Doppler was utilized to evaluate flow at rest and with distal augmentation maneuvers in the common femoral, femoral and popliteal veins. COMPARISON:  None. FINDINGS: Contralateral Common Femoral Vein: Respiratory phasicity is normal and symmetric with the symptomatic side. No evidence of thrombus. Normal compressibility. Common Femoral Vein: No evidence of thrombus. Normal compressibility, respiratory phasicity and response to augmentation. Saphenofemoral Junction: No evidence of thrombus. Normal compressibility and flow on color Doppler imaging. Profunda Femoral Vein: No evidence of thrombus. Normal compressibility and flow on color Doppler imaging. Femoral Vein: No evidence of thrombus. Normal compressibility, respiratory phasicity and response to augmentation. Popliteal Vein: No evidence of thrombus. Normal compressibility, respiratory phasicity and response to augmentation. Calf Veins: No evidence of thrombus. Normal compressibility and flow on color Doppler imaging. Superficial Great Saphenous Vein: No evidence of thrombus. Normal compressibility. Venous Reflux:  None. Other Findings: No evidence of superficial thrombophlebitis or abnormal fluid collection. IMPRESSION: No evidence of left lower extremity deep  venous thrombosis. Electronically Signed   By: Aletta Edouard M.D.   On: 02/15/2020 10:32   DG CHEST PORT 1 VIEW  Result Date: 02/16/2020 CLINICAL DATA:  Recent aspiration, initial encounter EXAM: PORTABLE CHEST 1 VIEW COMPARISON:  02/15/2020 FINDINGS: Cardiac shadow is stable. The lungs are hyperinflated but clear. Gastric catheter is noted within the stomach. Endotracheal tube is seen extending to the level of the aortic arch in satisfactory position. No confluent infiltrate is noted. Mild vascular congestion  remains. IMPRESSION: Stable vascular congestion.  No new focal abnormality is noted. Electronically Signed   By: Inez Catalina M.D.   On: 02/16/2020 16:04   Portable Chest xray  Result Date: 02/15/2020 CLINICAL DATA:  Acute respiratory failure. EXAM: PORTABLE CHEST 1 VIEW COMPARISON:  02/14/2020. FINDINGS: The ETT tip is stable above the carina. There is a nasogastric tube with tip and side port below the GE junction. Heart size appears normal. No pleural effusion or airspace consolidation. Pulmonary vascular congestion noted. No overt edema. IMPRESSION: Pulmonary vascular congestion. Electronically Signed   By: Kerby Moors M.D.   On: 02/15/2020 05:41   DG Chest Portable 1 View  Result Date: 02/14/2020 CLINICAL DATA:  Check gastrostomy catheter EXAM: PORTABLE CHEST 1 VIEW COMPARISON:  02/14/2020 FINDINGS: Gastric catheter is been withdrawn and now lies with the tip in the stomach. Proximal side port is noted at the gastroesophageal junction. This could be advanced a few cm deeper into the stomach. IMPRESSION: Gastric catheter as described. Electronically Signed   By: Inez Catalina M.D.   On: 02/14/2020 21:19   DG Chest Portable 1 View  Result Date: 02/14/2020 CLINICAL DATA:  Hypoxia check gastric catheter placement EXAM: PORTABLE CHEST 1 VIEW COMPARISON:  02/14/2020 FINDINGS: Endotracheal tube and gastric catheter are again seen. The gastric catheter has been advanced and is now coiled within the stomach extending into the proximal duodenum. The lungs are hyperinflated consistent with COPD. No bony abnormality is noted. IMPRESSION: Gastric catheter as described. COPD without acute abnormality. Electronically Signed   By: Inez Catalina M.D.   On: 02/14/2020 21:14   DG Chest Portable 1 View  Result Date: 02/14/2020 CLINICAL DATA:  Intubated, loss of consciousness, COPD EXAM: PORTABLE CHEST 1 VIEW COMPARISON:  02/14/2020 FINDINGS: 2 frontal views of the chest demonstrate endotracheal tube overlying  tracheal air column, tip approximately 3.8 cm above carina. Enteric catheter tip and side port project over the distal thoracic esophagus. Cardiac silhouette is unremarkable. Background emphysema again noted without airspace disease, effusion, or pneumothorax. No acute bony abnormalities. IMPRESSION: 1. Enteric catheter tip and side port projecting over the distal thoracic esophagus. Recommend advancing at least 7 cm to ensure side port placement within the gastric lumen. 2. No complication after intubation. 3. Emphysema. Electronically Signed   By: Randa Ngo M.D.   On: 02/14/2020 18:40   DG Chest Portable 1 View  Result Date: 02/14/2020 CLINICAL DATA:  Shortness of breath, history of COPD EXAM: PORTABLE CHEST 1 VIEW COMPARISON:  02/13/2020 FINDINGS: Emphysema and chronic interstitial changes. No new consolidation or edema. No pleural effusion or pneumothorax. Stable cardiomediastinal contours. IMPRESSION: COPD without acute process in the chest. Electronically Signed   By: Macy Mis M.D.   On: 02/14/2020 17:32   DG Chest Portable 1 View  Result Date: 02/13/2020 CLINICAL DATA:  Hypotension. EXAM: PORTABLE CHEST 1 VIEW COMPARISON:  Prior chest radiographs 12/16/2019 and earlier. FINDINGS: Heart size within normal limits. Prior median sternotomy/CABG. Aortic atherosclerosis. Unchanged nonspecific chronic prominence of  the interstitial lung markings. No appreciable airspace consolidation or frank pulmonary edema. No evidence of pleural effusion or pneumothorax. No acute bony abnormality identified. ACDF hardware within the lower cervical spine. IMPRESSION: No evidence of acute cardiopulmonary abnormality. Chronic nonspecific prominence of the interstitial lung markings, unchanged. Prior median sternotomy/CABG. Aortic Atherosclerosis (ICD10-I70.0). Electronically Signed   By: Kellie Simmering DO   On: 02/13/2020 18:15   ECHOCARDIOGRAM COMPLETE  Result Date: 02/16/2020    ECHOCARDIOGRAM REPORT   Patient  Name:   AQEEL NORGAARD Date of Exam: 02/16/2020 Medical Rec #:  [id number]         Height:       73.0 in Accession #:    [id number]        Weight:       190.3 lb Date of Birth:  [birth year]         BSA:          2.106 m Patient Age:    51 years          BP:           93/64 mmHg Patient Gender: M                 HR:           52 bpm. Exam Location:  [facility] Procedure: 2D Echo Indications:    Bacteremia 790.7 / R78.81  History:        Patient has prior history of Echocardiogram examinations, most                 recent 06/01/2019. CAD, COPD; Risk Factors:Current Smoker and                 Dyslipidemia. Opioid dependence.  Sonographer:    Leavy Cella RDCS (AE) Referring Phys: [id number] Liora Myles IMPRESSIONS  1. Left ventricular ejection fraction, by estimation, is 45 to 50%. The left ventricle has mildly decreased function. The left ventricle has no regional wall motion abnormalities. There is mild left ventricular hypertrophy. Left ventricular diastolic parameters are indeterminate.  2. Ventricular septum is flattened in systole consistent with RV pressure overload. . Right ventricular systolic function is severely reduced. The right ventricular size is severely enlarged.  3. Right atrial size was moderately dilated.  4. The mitral valve is normal in structure. No evidence of mitral valve regurgitation. No evidence of mitral stenosis.  5. AV and LVOT Dopplers are off axis and likely underestimated. Visually there is no significant stenosis or regurgitation. . The aortic valve is tricuspid. Aortic valve regurgitation is not visualized. No aortic stenosis is present. FINDINGS  Left Ventricle: Left ventricular ejection fraction, by estimation, is 45 to 50%. The left ventricle has mildly decreased function. The left ventricle has no regional wall motion abnormalities. The left ventricular internal cavity size was normal in size. There is mild left ventricular hypertrophy. Left ventricular diastolic parameters are  indeterminate. Right Ventricle: Ventricular septum is flattened in systole consistent with RV pressure overload. The right ventricular size is severely enlarged. Right vetricular wall thickness was not assessed. Right ventricular systolic function is severely reduced. Left Atrium: Left atrial size was normal in size. Right Atrium: Right atrial size was moderately dilated. Pericardium: There is no evidence of pericardial effusion. Mitral Valve: The mitral valve is normal in structure. No evidence of mitral valve regurgitation. No evidence of mitral valve stenosis. Tricuspid Valve: The tricuspid valve is normal in structure. Tricuspid valve regurgitation is not demonstrated. No evidence of tricuspid  stenosis. Aortic Valve: AV and LVOT Dopplers are off axis and likely underestimated. Visually there is no significant stenosis or regurgitation. The aortic valve is tricuspid. Aortic valve regurgitation is not visualized. No aortic stenosis is present. Pulmonic Valve: The pulmonic valve was not well visualized. Pulmonic valve regurgitation is not visualized. No evidence of pulmonic stenosis. Aorta: The aortic root is normal in size and structure. Pulmonary Artery: Indeterminant PASP, inadequate TR jet. Venous: IVC assessment for right atrial pressure unable to be performed due to mechanical ventilation. IAS/Shunts: No atrial level shunt detected by color flow Doppler.  LEFT VENTRICLE PLAX 2D LVIDd:         4.16 cm  Diastology LVIDs:         3.30 cm  LV e' medial:    6.31 cm/s LV PW:         1.35 cm  LV E/e' medial:  9.3 LV IVS:        1.00 cm  LV e' lateral:   7.51 cm/s LVOT diam:     1.90 cm  LV E/e' lateral: 7.8 LVOT Area:     2.84 cm  RIGHT VENTRICLE TAPSE (M-mode): 1.0 cm LEFT ATRIUM             Index       RIGHT ATRIUM           Index LA diam:        2.60 cm 1.23 cm/m  RA Area:     15.60 cm LA Vol (A2C):   23.8 ml 11.30 ml/m RA Volume:   46.60 ml  22.12 ml/m LA Vol (A4C):   26.7 ml 12.68 ml/m LA Biplane Vol:  26.0 ml 12.34 ml/m   AORTA Ao Root diam: 3.10 cm MITRAL VALVE               TRICUSPID VALVE MV Area (PHT): 3.37 cm    TR Peak grad:   17.0 mmHg MV Decel Time: 225 msec    TR Vmax:        206.00 cm/s MV E velocity: 58.70 cm/s MV A velocity: 42.40 cm/s  SHUNTS MV E/A ratio:  1.38        Systemic Diam: 1.90 cm Carlyle Dolly MD Electronically signed by Carlyle Dolly MD Signature Date/Time: 02/16/2020/12:32:09 PM    Final    CBC Recent Labs  Lab 02/13/20 1652 02/14/20 1723 02/15/20 0142 02/16/20 0804 02/17/20 0601  WBC 8.8 19.6* 19.4* 18.1* 17.7*  HGB 16.8 17.1* 16.6 15.9 16.7  HCT 55.7* 58.5* 54.8* 48.9 51.2  PLT 270 352 213 226 240  MCV 94.2 96.4 93.5 86.4 87.1  MCH 28.4 28.2 28.3 28.1 28.4  MCHC 30.2 29.2* 30.3 32.5 32.6  RDW 19.6* 19.1* 19.3* 19.6* 19.6*  LYMPHSABS 0.6* 0.8  --   --   --   MONOABS 0.8 1.8*  --   --   --   EOSABS 0.0 0.0  --   --   --   BASOSABS 0.1 0.0  --   --   --     Chemistries  Recent Labs  Lab 02/13/20 1652 02/14/20 1723 02/15/20 0142 02/16/20 0804 02/17/20 0601  NA 133* 135 136 138 139  K 4.8 4.2 4.4 3.1* 4.1  CL 91* 91* 98 104 104  CO2 33* 32 '25 26 27  ' GLUCOSE 118* 145* 143* 128* 104*  BUN 27* 40* 41* 40* 38*  CREATININE 1.59* 1.85* 1.47* 1.06 0.96  CALCIUM 10.6* 10.6* 9.8 9.1 9.3  MG  --   --   --  1.9  --   AST 35 294*  --  490* 841*  ALT 23 292*  --  744* 559*  ALKPHOS 85 96  --  74 80  BILITOT 0.9 1.2  --  0.9 1.0    No results for input(s): CHOL, HDL, LDLCALC, TRIG, CHOLHDL, LDLDIRECT in the last 72 hours. Lab Results  Component Value Date   HGBA1C 6.9 (H) 02/15/2020    No results for input(s): TSH, T4TOTAL, T3FREE, THYROIDAB in the last 72 hours.  Invalid input(s): FREET3 ------------------------------------------------------------------------------------------------------------------ No results for input(s): VITAMINB12, FOLATE, FERRITIN, TIBC, IRON, RETICCTPCT in the last 72 hours.  Coagulation profile Recent Labs  Lab  02/13/20 1652  INR 1.3*    No results for input(s): DDIMER in the last 72 hours.  Cardiac Enzymes No results for input(s): CKMB, TROPONINI, MYOGLOBIN in the last 168 hours.  Invalid input(s): CK ------------------------------------------------------------------------------------------------------------------    Component Value Date/Time   BNP 867.0 (H) 02/18/2020 9323   Roxan Hockey M.D on 02/18/2020 at 2:06 PM  Go to [email or web address] - for contact info  [facility] - Office  [phone]

## 2020-02-19 DIAGNOSIS — I25810 Atherosclerosis of coronary artery bypass graft(s) without angina pectoris: Secondary | ICD-10-CM | POA: Diagnosis not present

## 2020-02-19 DIAGNOSIS — J441 Chronic obstructive pulmonary disease with (acute) exacerbation: Secondary | ICD-10-CM | POA: Diagnosis not present

## 2020-02-19 DIAGNOSIS — J9601 Acute respiratory failure with hypoxia: Secondary | ICD-10-CM | POA: Diagnosis not present

## 2020-02-19 DIAGNOSIS — F1129 Opioid dependence with unspecified opioid-induced disorder: Secondary | ICD-10-CM | POA: Diagnosis not present

## 2020-02-19 LAB — CBC
HCT: 56.1 % — ABNORMAL HIGH (ref 39.0–52.0)
Hemoglobin: 16.7 g/dL (ref 13.0–17.0)
MCH: 27.5 pg (ref 26.0–34.0)
MCHC: 29.8 g/dL — ABNORMAL LOW (ref 30.0–36.0)
MCV: 92.4 fL (ref 80.0–100.0)
Platelets: 167 10*3/uL (ref 150–400)
RBC: 6.07 MIL/uL — ABNORMAL HIGH (ref 4.22–5.81)
RDW: 20.4 % — ABNORMAL HIGH (ref 11.5–15.5)
WBC: 13.4 10*3/uL — ABNORMAL HIGH (ref 4.0–10.5)
nRBC: 0 % (ref 0.0–0.2)

## 2020-02-19 LAB — COMPREHENSIVE METABOLIC PANEL
ALT: 175 U/L — ABNORMAL HIGH (ref 0–44)
AST: 426 U/L — ABNORMAL HIGH (ref 15–41)
Albumin: 3.2 g/dL — ABNORMAL LOW (ref 3.5–5.0)
Alkaline Phosphatase: 89 U/L (ref 38–126)
Anion gap: 9 (ref 5–15)
BUN: 29 mg/dL — ABNORMAL HIGH (ref 6–20)
CO2: 33 mmol/L — ABNORMAL HIGH (ref 22–32)
Calcium: 9.9 mg/dL (ref 8.9–10.3)
Chloride: 105 mmol/L (ref 98–111)
Creatinine, Ser: 0.76 mg/dL (ref 0.61–1.24)
GFR, Estimated: >60 mL/min (ref >60)
Glucose, Bld: 107 mg/dL — ABNORMAL HIGH (ref 70–99)
Potassium: 3.8 mmol/L (ref 3.5–5.1)
Sodium: 147 mmol/L — ABNORMAL HIGH (ref 135–145)
Total Bilirubin: 1.5 mg/dL — ABNORMAL HIGH (ref 0.3–1.2)
Total Protein: 6.2 g/dL — ABNORMAL LOW (ref 6.5–8.1)

## 2020-02-19 LAB — AMMONIA: Ammonia: 26 umol/L (ref 9–35)

## 2020-02-19 LAB — BLOOD GAS, ARTERIAL
Acid-Base Excess: 9 mmol/L — ABNORMAL HIGH (ref 0.0–2.0)
Bicarbonate: 28.5 mmol/L — ABNORMAL HIGH (ref 20.0–28.0)
FIO2: 30
O2 Saturation: 92.5 %
Patient temperature: 37.1
pCO2 arterial: 86.3 mmHg (ref 32.0–48.0)
pH, Arterial: 7.246 — ABNORMAL LOW (ref 7.350–7.450)
pO2, Arterial: 71.1 mmHg — ABNORMAL LOW (ref 83.0–108.0)

## 2020-02-19 LAB — CULTURE, BLOOD (ROUTINE X 2)
Culture: NO GROWTH
Culture: NO GROWTH
Culture: NO GROWTH
Special Requests: ADEQUATE
Special Requests: ADEQUATE
Special Requests: ADEQUATE

## 2020-02-19 LAB — GLUCOSE, CAPILLARY
Glucose-Capillary: 121 mg/dL — ABNORMAL HIGH (ref 70–99)
Glucose-Capillary: 123 mg/dL — ABNORMAL HIGH (ref 70–99)
Glucose-Capillary: 120 mg/dL — ABNORMAL HIGH (ref 70–99)
Glucose-Capillary: 124 mg/dL — ABNORMAL HIGH (ref 70–99)

## 2020-02-19 MED ORDER — DEXTROSE-NACL 5-0.45 % IV SOLN: INTRAVENOUS | Status: DC

## 2020-02-19 MED ORDER — METOPROLOL TARTRATE 50 MG PO TABS
50.0000 mg | ORAL_TABLET | Freq: Two times a day (BID) | ORAL | Status: DC
  Administered 2020-02-19 – 2020-02-21 (×3): 50 mg via ORAL
  Filled 2020-02-19 (×5): qty 1

## 2020-02-19 MED ORDER — ALBUTEROL SULFATE (2.5 MG/3ML) 0.083% IN NEBU: 2.5000 mg | INHALATION_SOLUTION | RESPIRATORY_TRACT | Status: DC | PRN

## 2020-02-19 MED ORDER — LORAZEPAM 2 MG/ML IJ SOLN
0.5000 mg | Freq: Three times a day (TID) | INTRAMUSCULAR | Status: DC | PRN
  Administered 2020-02-19 – 2020-02-27 (×3): 0.5 mg via INTRAVENOUS
  Filled 2020-02-19 (×6): qty 1

## 2020-02-19 MED ORDER — IPRATROPIUM-ALBUTEROL 0.5-2.5 (3) MG/3ML IN SOLN
3.0000 mL | Freq: Four times a day (QID) | RESPIRATORY_TRACT | Status: DC
  Administered 2020-02-19 – 2020-02-21 (×8): 3 mL via RESPIRATORY_TRACT
  Filled 2020-02-19 (×7): qty 3

## 2020-02-19 NOTE — Progress Notes (Signed)
Pt contact Lattie Haw updated via telephone

## 2020-02-19 NOTE — Progress Notes (Signed)
Patient agitated. Pulling at safety mitts and iv lines. Patient pulling at bipap and attempting to position self over bed rails. Redirection and orientation currently unsuccessful. PRN given per order.

## 2020-02-19 NOTE — Progress Notes (Signed)
Patient Demographics:    Antonio Ramirez, is a 59 y.o. male, DOB - November 13, 1960, XBJ:[id number]  Admit date - 02/14/2020   Admitting Physician Bethena Roys, MD  Outpatient Primary MD for the patient is [name], DO  LOS - 5   Chief Complaint  Patient presents with   Altered Mental Status        Subjective:    Antonio Ramirez is somewhat sleepy - 02/19/20 -Patient remains sleepy, no benzos were given overnight, -ABG with uncompensated hypercapnic respiratory failure -BiPAP initiated -  Assessment  & Plan :    Active Problems:   Acute respiratory failure with hypoxia (HCC)   Endotracheally intubated   Opioid dependence ([place])   COPD with acute exacerbation (HCC)   Depression   CAD (coronary artery disease)   Altered mental status  Brief Summary:- a58 y.o.malewith a history of COPD, ongoing tobacco use, CAD s/p CABG, carotid artery disease,  chronic back pain with opioid dependence, and opioid overdose as well as chronic depression admitted on 02/14/2020 with acute hypoxic respiratory failure requiring intubation and mechanical ventilation- -Patient had a similar presentation in March 2021 when he was intubated for altered mentation subsequently did okay was extubated -Extubated 02/17/2020 -BiPAP initiated on 02/19/2020  A/p 1)Acute hypoxic and hypercapnic respiratory failure--- initially thought to be from a combination of opiate abuse and COPD- pulmonary consult from Dr. Melvyn Novas appreciated --ABG improved--specifically respiratory acidosis has resolved -VQ scan negative for PE -Lower extremity venous Dopplers without acute DVT --Chest x-ray on 02/15/2020 and 02/16/20 with possible pulm venous congestion-treated with Lasix -Patient was successfully extubated on 02/17/20  -02/19/20--remains quite lethargic, chest x-ray and CT head without acute findings -ABG with PCO2 of 86 and a  pH of 7.24 with a PO2 of 71 -BiPAP initiated on 02/19/2020 for uncompensated respiratory acidosis secondary to hypercapnia  2)SIRS-- meets SIRS criteria, sepsis Not Confirmed at this time as source of infection yet to be identified ---There is suspicion for possible sepsis with preliminary blood cultures from 02/13/2020 with staph epi-contaminant Versus infection -Continue IV Ancef, repeat blood cultures from 02/14/2020 and 02/16/2020--- NGTD -Okay to stop Ancef on 02/19/2020  3)Staph Epi bacteremia and possible severe sepsis--- please see #2 above --Episodes of hemodynamic instability persist with erratic blood pressures --Transthoracic echocardiogram without evidence for endocarditis, --WBC 19.4>>18.1>>17.7>.13.4--patient is on steroids -Fevers resolved --Okay to stop Ancef on 02/19/2020  4) acute COPD exacerbation--- bronchodilators and steroids as ordered, Dr Melvyn Novas switched patient to hydrocortisone from Solu-Medrol due to hemodynamic instability  5)CAD---s/p CABG 2006 and PVD--- -c/n aspirin and Plavix -Echo with EF of 40 to 45% which is down from 55 to 60% back in February 2021  - 6)AKI----acute kidney injury on CKD stage -#B --   creatinine is down to 0.7 (peak was 1.85)  ,  baseline creatinine = 1.5   renally adjust medications, avoid nephrotoxic agents / dehydration  / hypotension -AKI resolved with hydration  7)Social/Ethics--plan of care and advanced directive discussed with patient ex-wife who is also his  POA--patient is a full code with full scope of treatment  8)FEN/hypernatremia.--Change IV fluids to D5 half-normal from D5 normal saline  -Suspect free water deficit due to poor oral intake due to lethargy post intubation - check CBGs and adjust sliding scale  9)HFrEF--Tachyarrhythmia--- patient alternating between SVT and A. fib with RVR--Cardiology consult appreciated, need to replace potassium, echo with reduced EF (45 to 50%) --Cardiology recommends against anticoagulation  at this time given low A. fib burden -Elevated BNP noted, Lasix and metoprolol as ordered  Disposition/Need for in-Hospital Stay- patient unable to be discharged at this time due to -remains hypoxic and lethargic requiring BiPAP-for uncompensated respiratory acidosis secondary to hypercapnia  Status is: Inpatient  Remains inpatient appropriate because:remains hypoxic and lethargic requiring BiPAP-for uncompensated respiratory acidosis secondary to hypercapnia   Disposition: The patient is from: Home              Anticipated d/c is to: Home              Anticipated d/c date is: > 3 days              Patient currently is not medically stable to d/c. Barriers: Not Clinically Stable- remains hypoxic and lethargic requiring BiPAP-for uncompensated respiratory acidosis secondary to hypercapnia  Code Status : full  Procedures:- Intubated 02/14/2020  -Extubated 02/17/2020 Bipap-- 02/19/20  Family Communication:   Discussed with patient's ex- wife/POA on 02/19/20  Consults  :  PCCM  DVT Prophylaxis  :  Lovenox -  - SCDs    Lab Results  Component Value Date   PLT 167 02/19/2020    Inpatient Medications  Scheduled Meds:  aspirin  81 mg Per Tube Daily   chlorhexidine gluconate (MEDLINE KIT)  15 mL Mouth Rinse BID   Chlorhexidine Gluconate Cloth  6 each Topical Daily   clopidogrel  75 mg Oral Daily   docusate  100 mg Oral BID   enoxaparin (LOVENOX) injection  40 mg Subcutaneous Q24H   hydrocortisone sodium succinate  50 mg Intravenous Q12H   influenza vac split quadrivalent PF  0.5 mL Intramuscular Tomorrow-1000   insulin aspart  0-5 Units Subcutaneous QHS   insulin aspart  0-6 Units Subcutaneous TID WC   ipratropium-albuterol  3 mL Nebulization QID   mouth rinse  15 mL Mouth Rinse 10 times per day   metoprolol tartrate  50 mg Oral BID   pneumococcal 23 valent vaccine  0.5 mL Intramuscular Tomorrow-1000   Continuous Infusions:   ceFAZolin (ANCEF) IV 2 g (02/19/20  1356)   dextrose 5 % and 0.45% NaCl     dextrose 5 % and 0.9% NaCl 50 mL/hr at 02/19/20 1211   famotidine (PEPCID) IV 20 mg (02/19/20 0918)   PRN Meds:.acetaminophen **OR** acetaminophen, albuterol, diphenhydrAMINE, LORazepam, ondansetron **OR** ondansetron (ZOFRAN) IV, oxyCODONE    Anti-infectives (From admission, onward)   Start     Dose/Rate Route Frequency Ordered Stop   02/15/20 2200  vancomycin (VANCOCIN) IVPB 1000 mg/200 mL premix  Status:  Discontinued        1,000 mg 200 mL/hr over 60 Minutes Intravenous Every 24 hours 02/14/20 2149 02/15/20 1044   02/15/20 1130  ceFAZolin (ANCEF) IVPB 2g/100 mL premix        2 g 200 mL/hr over 30 Minutes Intravenous Every 8 hours 02/15/20 1044 02/19/20 2359   02/14/20 2200  metroNIDAZOLE (FLAGYL) IVPB 500 mg  Status:  Discontinued        500 mg 100 mL/hr over 60 Minutes  Intravenous Every 8 hours 02/14/20 2136 02/15/20 1433   02/14/20 2200  vancomycin (VANCOREADY) IVPB 1250 mg/250 mL        1,250 mg 166.7 mL/hr over 90 Minutes Intravenous  Once 02/14/20 2148 02/15/20 0128   02/14/20 2200  ceFEPIme (MAXIPIME) 2 g in sodium chloride 0.9 % 100 mL IVPB  Status:  Discontinued        2 g 200 mL/hr over 30 Minutes Intravenous Every 12 hours 02/14/20 2148 02/15/20 1044       Objective:   Vitals:   02/19/20 1020 02/19/20 1054 02/19/20 1205 02/19/20 1322  BP: (!) 153/77     Pulse: 69 81 67 60  Resp: (!) 25 (!) 23 (!) 22   Temp:  98.8 F (37.1 C)    TempSrc:  Axillary    SpO2: 91% 92% 96% 95%  Weight:      Height:        Wt Readings from Last 3 Encounters:  02/19/20 82.5 kg  02/13/20 70.3 kg  12/16/19 80.3 kg    Intake/Output Summary (Last 24 hours) at 02/19/2020 1431 Last data filed at 02/19/2020 1020 Gross per 24 hour  Intake 1591.73 ml  Output 220 ml  Net 1371.73 ml   Physical Exam Gen:-Somewhat sleepy, not very verbal  HEENT:-Normocephalic atraumatic, no scleral icterus  Neck- Right IJ central line  Mouth/Nose- Bipap  Mask Lungs--somewhat diminished, no wheezing  CV- S1, S2 normal, regular , CABG scar Abd-  +ve B.Sounds, Abd Soft, not distended,    Extremity/Skin:- No  edema, pedal pulses present  Neuro-Psych-Limited exam as patient is lethargic  GU-Foley in situ ---adequate urine output   Data Review:   Micro Results Recent Results (from the past 240 hour(s))  Respiratory Panel by RT PCR (Flu A&B, Covid) - Nasopharyngeal Swab     Status: None   Collection Time: 02/13/20  4:51 PM   Specimen: Nasopharyngeal Swab  Result Value Ref Range Status   SARS Coronavirus 2 by RT PCR NEGATIVE NEGATIVE Final    Comment: (NOTE) SARS-CoV-2 target nucleic acids are NOT DETECTED.  The SARS-CoV-2 RNA is generally detectable in upper respiratoy specimens during the acute phase of infection. The lowest concentration of SARS-CoV-2 viral copies this assay can detect is 131 copies/mL. A negative result does not preclude SARS-Cov-2 infection and should not be used as the sole basis for treatment or other patient management decisions. A negative result may occur with  improper specimen collection/handling, submission of specimen other than nasopharyngeal swab, presence of viral mutation(s) within the areas targeted by this assay, and inadequate number of viral copies (<131 copies/mL). A negative result must be combined with clinical observations, patient history, and epidemiological information. The expected result is Negative.  Fact Sheet for Patients:  PinkCheek.be  Fact Sheet for Healthcare Providers:  GravelBags.it  This test is no t yet approved or cleared by the [place] FDA and  has been authorized for detection and/or diagnosis of SARS-CoV-2 by FDA under an Emergency Use Authorization (EUA). This EUA will remain  in effect (meaning this test can be used) for the duration of the COVID-19 declaration under Section 564(b)(1) of the Act, 21  U.S.C. section 360bbb-3(b)(1), unless the authorization is terminated or revoked sooner.     Influenza A by PCR NEGATIVE NEGATIVE Final   Influenza B by PCR NEGATIVE NEGATIVE Final    Comment: (NOTE) The Xpert Xpress SARS-CoV-2/FLU/RSV assay is intended as an aid in  the diagnosis of influenza from Nasopharyngeal swab  specimens and  should not be used as a sole basis for treatment. Nasal washings and  aspirates are unacceptable for Xpert Xpress SARS-CoV-2/FLU/RSV  testing.  Fact Sheet for Patients: PinkCheek.be  Fact Sheet for Healthcare Providers: GravelBags.it  This test is not yet approved or cleared by the [place] FDA and  has been authorized for detection and/or diagnosis of SARS-CoV-2 by  FDA under an Emergency Use Authorization (EUA). This EUA will remain  in effect (meaning this test can be used) for the duration of the  Covid-19 declaration under Section 564(b)(1) of the Act, 21  U.S.C. section 360bbb-3(b)(1), unless the authorization is  terminated or revoked. Performed at [facility], [address] [place], [place] [address]   Culture, blood (Routine x 2)     Status: Abnormal   Collection Time: 02/13/20  4:52 PM   Specimen: Right Antecubital; Blood  Result Value Ref Range Status   Specimen Description   Final    RIGHT ANTECUBITAL Performed at [facility], [address] [place], [place] [address]    Special Requests   Final    BOTTLES DRAWN AEROBIC AND ANAEROBIC Blood Culture adequate volume Performed at [facility], [address], [place], [place] [address]    Culture  Setup Time   Final    GRAM POSITIVE COCCI AEROBIC BOTTLE ONLY CRITICAL RESULT CALLED TO, READ BACK BY AND VERIFIED WITH: G. Coffee PharmD 9:35 02/15/20 ([email or web address])    Culture (A)  Final    STAPHYLOCOCCUS EPIDERMIDIS THE SIGNIFICANCE OF ISOLATING THIS ORGANISM FROM A SINGLE SET OF BLOOD CULTURES WHEN MULTIPLE SETS ARE DRAWN IS  UNCERTAIN. PLEASE NOTIFY THE MICROBIOLOGY DEPARTMENT WITHIN ONE WEEK IF SPECIATION AND SENSITIVITIES ARE REQUIRED. Performed at [facility], [place] [address] [place], [place] [address]    Report Status 02/16/2020 FINAL  Final  Blood Culture ID Panel (Reflexed)     Status: Abnormal   Collection Time: 02/13/20  4:52 PM  Result Value Ref Range Status   Enterococcus faecalis NOT DETECTED NOT DETECTED Final   Enterococcus Faecium NOT DETECTED NOT DETECTED Final   Listeria monocytogenes NOT DETECTED NOT DETECTED Final   Staphylococcus species DETECTED (A) NOT DETECTED Final    Comment: CRITICAL RESULT CALLED TO, READ BACK BY AND VERIFIED WITH: G. Coffee PharmD 9:35 02/15/20 ([email or web address])    Staphylococcus aureus (BCID) NOT DETECTED NOT DETECTED Final   Staphylococcus epidermidis DETECTED (A) NOT DETECTED Final    Comment: CRITICAL RESULT CALLED TO, READ BACK BY AND VERIFIED WITH: G. Coffee PharmD 9:35 02/15/20 (wilsonm)    Staphylococcus lugdunensis NOT DETECTED NOT DETECTED Final   Streptococcus species NOT DETECTED NOT DETECTED Final   Streptococcus agalactiae NOT DETECTED NOT DETECTED Final   Streptococcus pneumoniae NOT DETECTED NOT DETECTED Final   Streptococcus pyogenes NOT DETECTED NOT DETECTED Final   A.calcoaceticus-baumannii NOT DETECTED NOT DETECTED Final   Bacteroides fragilis NOT DETECTED NOT DETECTED Final   Enterobacterales NOT DETECTED NOT DETECTED Final   Enterobacter cloacae complex NOT DETECTED NOT DETECTED Final   Escherichia coli NOT DETECTED NOT DETECTED Final   Klebsiella aerogenes NOT DETECTED NOT DETECTED Final   Klebsiella oxytoca NOT DETECTED NOT DETECTED Final   Klebsiella pneumoniae NOT DETECTED NOT DETECTED Final   Proteus species NOT DETECTED NOT DETECTED Final   Salmonella species NOT DETECTED NOT DETECTED Final   Serratia marcescens NOT DETECTED NOT DETECTED Final   Haemophilus influenzae NOT DETECTED NOT DETECTED Final   Neisseria meningitidis NOT  DETECTED NOT DETECTED Final  Pseudomonas aeruginosa NOT DETECTED NOT DETECTED Final   Stenotrophomonas maltophilia NOT DETECTED NOT DETECTED Final   Candida albicans NOT DETECTED NOT DETECTED Final   Candida auris NOT DETECTED NOT DETECTED Final   Candida glabrata NOT DETECTED NOT DETECTED Final   Candida krusei NOT DETECTED NOT DETECTED Final   Candida parapsilosis NOT DETECTED NOT DETECTED Final   Candida tropicalis NOT DETECTED NOT DETECTED Final   Cryptococcus neoformans/gattii NOT DETECTED NOT DETECTED Final   Methicillin resistance mecA/C NOT DETECTED NOT DETECTED Final    Comment: Performed at [facility], [place] [address], [place], [place] [address]  Culture, blood (Routine x 2)     Status: None   Collection Time: 02/13/20  5:44 PM   Specimen: Right Antecubital; Blood  Result Value Ref Range Status   Specimen Description RIGHT ANTECUBITAL  Final   Special Requests   Final    BOTTLES DRAWN AEROBIC AND ANAEROBIC Blood Culture adequate volume   Culture   Final    NO GROWTH 6 DAYS Performed at [facility], [address] [place], [place] [address]    Report Status 02/19/2020 FINAL  Final  Culture, Urine     Status: None   Collection Time: 02/14/20  9:26 PM   Specimen: Urine, Clean Catch  Result Value Ref Range Status   Specimen Description   Final    URINE, CLEAN CATCH Performed at [facility], [address], [place], [place] [address]    Special Requests   Final    NONE Performed at [facility], [address], [place], [place] [address]    Culture   Final    NO GROWTH Performed at [facility], [place] [address], [place], [place] [address]    Report Status 02/16/2020 FINAL  Final  Culture, blood (routine x 2)     Status: None   Collection Time: 02/14/20  9:44 PM   Specimen: BLOOD RIGHT HAND  Result Value Ref Range Status   Specimen Description BLOOD RIGHT HAND  Final   Special Requests   Final    BOTTLES DRAWN AEROBIC ONLY Blood Culture adequate volume    Culture   Final    NO GROWTH 5 DAYS Performed at [facility], [address] [place], [place] [address]    Report Status 02/19/2020 FINAL  Final  Culture, blood (routine x 2)     Status: None   Collection Time: 02/14/20  9:58 PM   Specimen: BLOOD LEFT HAND  Result Value Ref Range Status   Specimen Description BLOOD LEFT HAND  Final   Special Requests   Final    BOTTLES DRAWN AEROBIC ONLY Blood Culture adequate volume   Culture   Final    NO GROWTH 5 DAYS Performed at [facility], [address], [place], [place] [address]    Report Status 02/19/2020 FINAL  Final  MRSA PCR Screening     Status: None   Collection Time: 02/15/20 12:05 AM   Specimen: Nasal Mucosa; Nasopharyngeal  Result Value Ref Range Status   MRSA by PCR NEGATIVE NEGATIVE Final    Comment:        The GeneXpert MRSA Assay (FDA approved for NASAL specimens only), is one component of a comprehensive MRSA colonization surveillance program. It is not intended to diagnose MRSA infection nor to guide or monitor treatment for MRSA infections. Performed at [facility], [address], [place], [place] [address]   Culture, blood (Routine X 2) w Reflex to ID  Panel     Status: None (Preliminary result)   Collection Time: 02/16/20  2:33 PM   Specimen: BLOOD RIGHT HAND  Result Value Ref Range Status   Specimen Description BLOOD RIGHT HAND  Final   Special Requests   Final    BOTTLES DRAWN AEROBIC AND ANAEROBIC Blood Culture adequate volume   Culture   Final    NO GROWTH 3 DAYS Performed at [facility], [address], [place], [place] [address]    Report Status PENDING  Incomplete  Culture, blood (Routine X 2) w Reflex to ID Panel     Status: None (Preliminary result)   Collection Time: 02/16/20  2:33 PM   Specimen: BLOOD LEFT HAND  Result Value Ref Range Status   Specimen Description BLOOD LEFT HAND  Final   Special Requests   Final    BOTTLES DRAWN AEROBIC AND ANAEROBIC Blood Culture results may not be  optimal due to an inadequate volume of blood received in culture bottles   Culture   Final    NO GROWTH 3 DAYS Performed at [facility], [address], [name], [place] [address]    Report Status PENDING  Incomplete   Radiology Reports CT HEAD WO CONTRAST  Result Date: 02/18/2020 CLINICAL DATA:  Acute neuro deficit.  Stroke suspected. EXAM: CT HEAD WITHOUT CONTRAST TECHNIQUE: Contiguous axial images were obtained from the base of the skull through the vertex without intravenous contrast. COMPARISON:  02/23/2020.  CT head 06/29/2019 FINDINGS: Brain: Hypodensity in the right temporal and parietal lobe unchanged compatible with chronic infarct. Negative for acute infarct, hemorrhage, mass. Negative for hydrocephalus. Vascular: Negative for hyperdense vessel Skull: Negative Sinuses/Orbits: Paranasal sinuses clear.  Negative orbit Other: None IMPRESSION: Chronic right MCA infarct. No acute abnormality and no change from the prior CT. Electronically Signed   By: Franchot Gallo M.D.   On: 02/18/2020 15:03   CT Head Wo Contrast  Result Date: 02/13/2020 CLINICAL DATA:  Unresponsive EXAM: CT HEAD WITHOUT CONTRAST TECHNIQUE: Contiguous axial images were obtained from the base of the skull through the vertex without intravenous contrast. COMPARISON:  06/29/2019 FINDINGS: Brain: There again noted changes consistent with prior encephalomalacia from ischemic event in the distribution of the right middle cerebral artery involving the right temporal and parietal lobes. The overall appearance is stable. No findings to suggest acute hemorrhage, acute infarction or space-occupying mass lesion are noted. Mild atrophic changes are again seen. Vascular: No hyperdense vessel or unexpected calcification. Skull: Normal. Negative for fracture or focal lesion. Sinuses/Orbits: No acute finding. Other: None. 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 No acute  abnormality noted. Electronically Signed   By: Inez Catalina M.D.   On: 02/13/2020 21:30   NM Pulmonary Perfusion  Result Date: 02/15/2020 CLINICAL DATA:  Respiratory failure EXAM: NUCLEAR MEDICINE PERFUSION LUNG SCAN TECHNIQUE: Perfusion images were obtained in multiple projections after intravenous injection of radiopharmaceutical. Ventilation scans intentionally deferred if perfusion scan and chest x-ray adequate for interpretation during COVID 19 epidemic. RADIOPHARMACEUTICALS:  4.4 mCi Tc-68mMAA IV COMPARISON:  Chest radiograph 02/15/2020 FINDINGS: Inhomogeneous tracer distribution in a nonsegmental pattern in both lungs, pattern favoring parenchymal lung disease. Tiny subsegmental defects at lung bases. Findings are not suspicious for pulmonary embolism. IMPRESSION: No scintigraphic evidence of pulmonary embolism. Electronically Signed   By: MLavonia DanaM.D.   On: 02/15/2020 12:40   UKoreaVenous Img Lower Unilateral Left (DVT)  Result Date: 02/15/2020 CLINICAL DATA:  Left  lower extremity edema. EXAM: LEFT LOWER EXTREMITY VENOUS DOPPLER ULTRASOUND TECHNIQUE: Gray-scale sonography with graded compression, as well as color Doppler and duplex ultrasound were performed to evaluate the lower extremity deep venous systems from the level of the common femoral vein and including the common femoral, femoral, profunda femoral, popliteal and calf veins including the posterior tibial, peroneal and gastrocnemius veins when visible. The superficial great saphenous vein was also interrogated. Spectral Doppler was utilized to evaluate flow at rest and with distal augmentation maneuvers in the common femoral, femoral and popliteal veins. COMPARISON:  None. FINDINGS: Contralateral Common Femoral Vein: Respiratory phasicity is normal and symmetric with the symptomatic side. No evidence of thrombus. Normal compressibility. Common Femoral Vein: No evidence of thrombus. Normal compressibility, respiratory phasicity and response  to augmentation. Saphenofemoral Junction: No evidence of thrombus. Normal compressibility and flow on color Doppler imaging. Profunda Femoral Vein: No evidence of thrombus. Normal compressibility and flow on color Doppler imaging. Femoral Vein: No evidence of thrombus. Normal compressibility, respiratory phasicity and response to augmentation. Popliteal Vein: No evidence of thrombus. Normal compressibility, respiratory phasicity and response to augmentation. Calf Veins: No evidence of thrombus. Normal compressibility and flow on color Doppler imaging. Superficial Great Saphenous Vein: No evidence of thrombus. Normal compressibility. Venous Reflux:  None. Other Findings: No evidence of superficial thrombophlebitis or abnormal fluid collection. IMPRESSION: No evidence of left lower extremity deep venous thrombosis. Electronically Signed   By: Aletta Edouard M.D.   On: 02/15/2020 10:32   DG Chest Port 1 View  Result Date: 02/18/2020 CLINICAL DATA:  Respiratory failure with hypoxia. EXAM: PORTABLE CHEST 1 VIEW COMPARISON:  02/16/2020 FINDINGS: Endotracheal tube removed.  NG tube removed. Lungs well aerated and clear without infiltrate or effusion. Negative for heart failure. IMPRESSION: No active disease. Electronically Signed   By: Franchot Gallo M.D.   On: 02/18/2020 15:08   DG CHEST PORT 1 VIEW  Result Date: 02/16/2020 CLINICAL DATA:  Recent aspiration, initial encounter EXAM: PORTABLE CHEST 1 VIEW COMPARISON:  02/15/2020 FINDINGS: Cardiac shadow is stable. The lungs are hyperinflated but clear. Gastric catheter is noted within the stomach. Endotracheal tube is seen extending to the level of the aortic arch in satisfactory position. No confluent infiltrate is noted. Mild vascular congestion remains. IMPRESSION: Stable vascular congestion.  No new focal abnormality is noted. Electronically Signed   By: Inez Catalina M.D.   On: 02/16/2020 16:04   Portable Chest xray  Result Date: 02/15/2020 CLINICAL DATA:   Acute respiratory failure. EXAM: PORTABLE CHEST 1 VIEW COMPARISON:  02/14/2020. FINDINGS: The ETT tip is stable above the carina. There is a nasogastric tube with tip and side port below the GE junction. Heart size appears normal. No pleural effusion or airspace consolidation. Pulmonary vascular congestion noted. No overt edema. IMPRESSION: Pulmonary vascular congestion. Electronically Signed   By: Kerby Moors M.D.   On: 02/15/2020 05:41   DG Chest Portable 1 View  Result Date: 02/14/2020 CLINICAL DATA:  Check gastrostomy catheter EXAM: PORTABLE CHEST 1 VIEW COMPARISON:  02/14/2020 FINDINGS: Gastric catheter is been withdrawn and now lies with the tip in the stomach. Proximal side port is noted at the gastroesophageal junction. This could be advanced a few cm deeper into the stomach. IMPRESSION: Gastric catheter as described. Electronically Signed   By: Inez Catalina M.D.   On: 02/14/2020 21:19   DG Chest Portable 1 View  Result Date: 02/14/2020 CLINICAL DATA:  Hypoxia check gastric catheter placement EXAM: PORTABLE CHEST 1 VIEW COMPARISON:  02/14/2020  FINDINGS: Endotracheal tube and gastric catheter are again seen. The gastric catheter has been advanced and is now coiled within the stomach extending into the proximal duodenum. The lungs are hyperinflated consistent with COPD. No bony abnormality is noted. IMPRESSION: Gastric catheter as described. COPD without acute abnormality. Electronically Signed   By: Inez Catalina M.D.   On: 02/14/2020 21:14   DG Chest Portable 1 View  Result Date: 02/14/2020 CLINICAL DATA:  Intubated, loss of consciousness, COPD EXAM: PORTABLE CHEST 1 VIEW COMPARISON:  02/14/2020 FINDINGS: 2 frontal views of the chest demonstrate endotracheal tube overlying tracheal air column, tip approximately 3.8 cm above carina. Enteric catheter tip and side port project over the distal thoracic esophagus. Cardiac silhouette is unremarkable. Background emphysema again noted without airspace  disease, effusion, or pneumothorax. No acute bony abnormalities. IMPRESSION: 1. Enteric catheter tip and side port projecting over the distal thoracic esophagus. Recommend advancing at least 7 cm to ensure side port placement within the gastric lumen. 2. No complication after intubation. 3. Emphysema. Electronically Signed   By: Randa Ngo M.D.   On: 02/14/2020 18:40   DG Chest Portable 1 View  Result Date: 02/14/2020 CLINICAL DATA:  Shortness of breath, history of COPD EXAM: PORTABLE CHEST 1 VIEW COMPARISON:  02/13/2020 FINDINGS: Emphysema and chronic interstitial changes. No new consolidation or edema. No pleural effusion or pneumothorax. Stable cardiomediastinal contours. IMPRESSION: COPD without acute process in the chest. Electronically Signed   By: Macy Mis M.D.   On: 02/14/2020 17:32   DG Chest Portable 1 View  Result Date: 02/13/2020 CLINICAL DATA:  Hypotension. EXAM: PORTABLE CHEST 1 VIEW COMPARISON:  Prior chest radiographs 12/16/2019 and earlier. FINDINGS: Heart size within normal limits. Prior median sternotomy/CABG. Aortic atherosclerosis. Unchanged nonspecific chronic prominence of the interstitial lung markings. No appreciable airspace consolidation or frank pulmonary edema. No evidence of pleural effusion or pneumothorax. No acute bony abnormality identified. ACDF hardware within the lower cervical spine. IMPRESSION: No evidence of acute cardiopulmonary abnormality. Chronic nonspecific prominence of the interstitial lung markings, unchanged. Prior median sternotomy/CABG. Aortic Atherosclerosis (ICD10-I70.0). Electronically Signed   By: Kellie Simmering DO   On: 02/13/2020 18:15   ECHOCARDIOGRAM COMPLETE  Result Date: 02/16/2020    ECHOCARDIOGRAM REPORT   Patient Name:   TELESFORO BROSNAHAN Date of Exam: 02/16/2020 Medical Rec #:  [id number]         Height:       73.0 in Accession #:    [id number]        Weight:       190.3 lb Date of Birth:  [birth year]         BSA:          2.106 m  Patient Age:    89 years          BP:           93/64 mmHg Patient Gender: M                 HR:           52 bpm. Exam Location:  [facility] Procedure: 2D Echo Indications:    Bacteremia 790.7 / R78.81  History:        Patient has prior history of Echocardiogram examinations, most                 recent 06/01/2019. CAD, COPD; Risk Factors:Current Smoker and  Dyslipidemia. Opioid dependence.  Sonographer:    Leavy Cella RDCS (AE) Referring Phys: [id number]   IMPRESSIONS  1. Left ventricular ejection fraction, by estimation, is 45 to 50%. The left ventricle has mildly decreased function. The left ventricle has no regional wall motion abnormalities. There is mild left ventricular hypertrophy. Left ventricular diastolic parameters are indeterminate.  2. Ventricular septum is flattened in systole consistent with RV pressure overload. . Right ventricular systolic function is severely reduced. The right ventricular size is severely enlarged.  3. Right atrial size was moderately dilated.  4. The mitral valve is normal in structure. No evidence of mitral valve regurgitation. No evidence of mitral stenosis.  5. AV and LVOT Dopplers are off axis and likely underestimated. Visually there is no significant stenosis or regurgitation. . The aortic valve is tricuspid. Aortic valve regurgitation is not visualized. No aortic stenosis is present. FINDINGS  Left Ventricle: Left ventricular ejection fraction, by estimation, is 45 to 50%. The left ventricle has mildly decreased function. The left ventricle has no regional wall motion abnormalities. The left ventricular internal cavity size was normal in size. There is mild left ventricular hypertrophy. Left ventricular diastolic parameters are indeterminate. Right Ventricle: Ventricular septum is flattened in systole consistent with RV pressure overload. The right ventricular size is severely enlarged. Right vetricular wall thickness was not assessed. Right  ventricular systolic function is severely reduced. Left Atrium: Left atrial size was normal in size. Right Atrium: Right atrial size was moderately dilated. Pericardium: There is no evidence of pericardial effusion. Mitral Valve: The mitral valve is normal in structure. No evidence of mitral valve regurgitation. No evidence of mitral valve stenosis. Tricuspid Valve: The tricuspid valve is normal in structure. Tricuspid valve regurgitation is not demonstrated. No evidence of tricuspid stenosis. Aortic Valve: AV and LVOT Dopplers are off axis and likely underestimated. Visually there is no significant stenosis or regurgitation. The aortic valve is tricuspid. Aortic valve regurgitation is not visualized. No aortic stenosis is present. Pulmonic Valve: The pulmonic valve was not well visualized. Pulmonic valve regurgitation is not visualized. No evidence of pulmonic stenosis. Aorta: The aortic root is normal in size and structure. Pulmonary Artery: Indeterminant PASP, inadequate TR jet. Venous: IVC assessment for right atrial pressure unable to be performed due to mechanical ventilation. IAS/Shunts: No atrial level shunt detected by color flow Doppler.  LEFT VENTRICLE PLAX 2D LVIDd:         4.16 cm  Diastology LVIDs:         3.30 cm  LV e' medial:    6.31 cm/s LV PW:         1.35 cm  LV E/e' medial:  9.3 LV IVS:        1.00 cm  LV e' lateral:   7.51 cm/s LVOT diam:     1.90 cm  LV E/e' lateral: 7.8 LVOT Area:     2.84 cm  RIGHT VENTRICLE TAPSE (M-mode): 1.0 cm LEFT ATRIUM             Index       RIGHT ATRIUM           Index LA diam:        2.60 cm 1.23 cm/m  RA Area:     15.60 cm LA Vol (A2C):   23.8 ml 11.30 ml/m RA Volume:   46.60 ml  22.12 ml/m LA Vol (A4C):   26.7 ml 12.68 ml/m LA Biplane Vol: 26.0 ml 12.34 ml/m   AORTA Ao Root  diam: 3.10 cm MITRAL VALVE               TRICUSPID VALVE MV Area (PHT): 3.37 cm    TR Peak grad:   17.0 mmHg MV Decel Time: 225 msec    TR Vmax:        206.00 cm/s MV E velocity: 58.70  cm/s MV A velocity: 42.40 cm/s  SHUNTS MV E/A ratio:  1.38        Systemic Diam: 1.90 cm Carlyle Dolly MD Electronically signed by Carlyle Dolly MD Signature Date/Time: 02/16/2020/12:32:09 PM    Final    CBC Recent Labs  Lab 02/13/20 1652 02/13/20 1652 02/14/20 1723 02/15/20 0142 02/16/20 0804 02/17/20 0601 02/19/20 0439  WBC 8.8   < > 19.6* 19.4* 18.1* 17.7* 13.4*  HGB 16.8   < > 17.1* 16.6 15.9 16.7 16.7  HCT 55.7*   < > 58.5* 54.8* 48.9 51.2 56.1*  PLT 270   < > 352 213 226 240 167  MCV 94.2   < > 96.4 93.5 86.4 87.1 92.4  MCH 28.4   < > 28.2 28.3 28.1 28.4 27.5  MCHC 30.2   < > 29.2* 30.3 32.5 32.6 29.8*  RDW 19.6*   < > 19.1* 19.3* 19.6* 19.6* 20.4*  LYMPHSABS 0.6*  --  0.8  --   --   --   --   MONOABS 0.8  --  1.8*  --   --   --   --   EOSABS 0.0  --  0.0  --   --   --   --   BASOSABS 0.1  --  0.0  --   --   --   --    < > = values in this interval not displayed.    Chemistries  Recent Labs  Lab 02/13/20 1652 02/13/20 1652 02/14/20 1723 02/15/20 0142 02/16/20 0804 02/17/20 0601 02/19/20 0439  NA 133*   < > 135 136 138 139 147*  K 4.8   < > 4.2 4.4 3.1* 4.1 3.8  CL 91*   < > 91* 98 104 104 105  CO2 33*   < > 32 _0 33*  GLUCOSE 118*   < > 145* 143* 128* 104* 107*  BUN 27*   < > 40* 41* 40* 38* 29*  CREATININE 1.59*   < > 1.85* 1.47* 1.06 0.96 0.76  CALCIUM 10.6*   < > 10.6* 9.8 9.1 9.3 9.9  MG  --   --   --   --  1.9  --   --   AST 35  --  294*  --  490* 841* 426*  ALT 23  --  292*  --  744* 559* 175*  ALKPHOS 85  --  96  --  74 80 89  BILITOT 0.9  --  1.2  --  0.9 1.0 1.5*   < > = values in this interval not displayed.    No results for input(s): CHOL, HDL, LDLCALC, TRIG, CHOLHDL, LDLDIRECT in the last 72 hours. Lab Results  Component Value Date   HGBA1C 6.9 (H) 02/15/2020    No results for input(s): TSH, T4TOTAL, T3FREE, THYROIDAB in the last 72 hours.  Invalid input(s):  FREET3 ------------------------------------------------------------------------------------------------------------------ No results for input(s): VITAMINB12, FOLATE, FERRITIN, TIBC, IRON, RETICCTPCT in the last 72 hours.  Coagulation profile Recent Labs  Lab 02/13/20 1652  INR 1.3*    No results for input(s): DDIMER in the last 72  hours.  Cardiac Enzymes No results for input(s): CKMB, TROPONINI, MYOGLOBIN in the last 168 hours.  Invalid input(s): CK ------------------------------------------------------------------------------------------------------------------    Component Value Date/Time   BNP 867.0 (H) 02/18/2020 1696   Roxan Hockey M.D on 02/19/2020 at 2:31 PM  Go to [email or web address] - for contact info  [facility] - Office  [phone]

## 2020-02-20 ENCOUNTER — Inpatient Hospital Stay (HOSPITAL_COMMUNITY): Payer: Medicare Other

## 2020-02-20 DIAGNOSIS — I480 Paroxysmal atrial fibrillation: Secondary | ICD-10-CM

## 2020-02-20 DIAGNOSIS — I429 Cardiomyopathy, unspecified: Secondary | ICD-10-CM

## 2020-02-20 DIAGNOSIS — J9601 Acute respiratory failure with hypoxia: Secondary | ICD-10-CM | POA: Diagnosis not present

## 2020-02-20 DIAGNOSIS — F1129 Opioid dependence with unspecified opioid-induced disorder: Secondary | ICD-10-CM | POA: Diagnosis not present

## 2020-02-20 DIAGNOSIS — I25810 Atherosclerosis of coronary artery bypass graft(s) without angina pectoris: Secondary | ICD-10-CM | POA: Diagnosis not present

## 2020-02-20 LAB — GLUCOSE, CAPILLARY
Glucose-Capillary: 121 mg/dL — ABNORMAL HIGH (ref 70–99)
Glucose-Capillary: 112 mg/dL — ABNORMAL HIGH (ref 70–99)
Glucose-Capillary: 113 mg/dL — ABNORMAL HIGH (ref 70–99)
Glucose-Capillary: 97 mg/dL (ref 70–99)
Glucose-Capillary: 104 mg/dL — ABNORMAL HIGH (ref 70–99)

## 2020-02-20 MED ORDER — ORAL CARE MOUTH RINSE
15.0000 mL | Freq: Two times a day (BID) | OROMUCOSAL | Status: DC
  Administered 2020-02-20 – 2020-02-26 (×8): 15 mL via OROMUCOSAL

## 2020-02-20 MED ORDER — ENALAPRILAT 1.25 MG/ML IV SOLN
1.2500 mg | Freq: Four times a day (QID) | INTRAVENOUS | Status: DC
  Administered 2020-02-20 – 2020-02-21 (×4): 1.25 mg via INTRAVENOUS
  Filled 2020-02-20 (×4): qty 2

## 2020-02-20 NOTE — Progress Notes (Addendum)
Patient Demographics:    Antonio Ramirez, is a 59 y.o. male, DOB - [birth year], WEX:[id number]  Admit date - 02/14/2020   Admitting Physician Ejiroghene Arlyce Dice, MD  Outpatient Primary MD for the patient is [name], DO  LOS - 6   Chief Complaint  Patient presents with  . Altered Mental Status        Subjective:    Antonio Ramirez is somewhat sleepy - 02/20/20 -Less sleepy after BiPAP use -Still pretty weak PT eval appreciated -Oral intake is inconsistent -Episodes of tachyarrhythmia due to inability to consistently take oral metoprolol  Assessment  & Plan :    Active Problems:   Acute respiratory failure with hypoxia (HCC)   Endotracheally intubated   Opioid dependence ([place])   COPD with acute exacerbation (HCC)   Depression   CAD (coronary artery disease)   Altered mental status  Brief Summary:- a58 y.o.malewith a history of COPD, ongoing tobacco use, CAD s/p CABG, carotid artery disease,  chronic back pain with opioid dependence, and opioid overdose as well as chronic depression admitted on 02/14/2020 with acute hypoxic respiratory failure requiring intubation and mechanical ventilation- -Patient had a similar presentation in March 2021 when he was intubated for altered mentation subsequently did okay was extubated -Extubated 02/17/2020 -BiPAP initiated on 02/19/2020, now on nightly BiPAP  A/p 1)Acute hypoxic and hypercapnic respiratory failure--- initially thought to be from a combination of opiate abuse and COPD- pulmonary consult from Dr. Melvyn Novas appreciated --ABG improved--specifically respiratory acidosis has resolved -VQ scan negative for PE -Lower extremity venous Dopplers without acute DVT --Chest x-ray on 02/15/2020 and 02/16/20 with possible pulm venous congestion-treated with Lasix -Patient was successfully extubated on 02/17/20  -02/20/20-- -less sleepy with BiPAP  use -BiPAP initiated on 02/19/2020 for uncompensated respiratory acidosis secondary to hypercapnia--- currently requiring nightly BiPAP  2)SIRS-- meets SIRS criteria, sepsis Not Confirmed at this time as source of infection yet to be identified ---There is suspicion for possible sepsis with preliminary blood cultures from 02/13/2020 with staph epi-contaminant Versus infection -Continue IV Ancef, repeat blood cultures from 02/14/2020 and 02/16/2020--- NGTD -Stopped Ancef on 02/19/2020  3)Staph Epi bacteremia and possible severe sepsis--- please see #2 above --Episodes of hemodynamic instability persist with erratic blood pressures --Transthoracic echocardiogram without evidence for endocarditis, --WBC 19.4>>18.1>>17.7>.13.4--patient is on steroids -Fevers resolved Stopped Ancef on 02/19/2020  4) acute COPD exacerbation--- bronchodilators and steroids as ordered, Dr Melvyn Novas switched patient to hydrocortisone from Solu-Medrol due to hemodynamic instability  5)CAD---s/p CABG 2006 and PVD--- -c/n aspirin and Plavix -Echo with EF of 40 to 45% which is down from 55 to 60% back in February 2021  - 6)AKI----acute kidney injury on CKD stage -#B --   creatinine is has normalized (peak was 1.85)  ,  baseline creatinine = 1.5   renally adjust medications, avoid nephrotoxic agents / dehydration  / hypotension -AKI resolved with hydration  7)Social/Ethics--plan of care and advanced directive discussed with patient ex-wife who is also his POA--patient is a full code with full scope of treatment  8)FEN/hypernatremia.--Change IV fluids to D5 half-normal from D5 normal saline  -Suspect free water deficit due to poor oral intake due to lethargy post intubation - check CBGs and adjust sliding scale  9)HFrEF--Tachyarrhythmia--- patient alternating  between SVT and A. fib with RVR--Cardiology consult appreciated, need to replace potassium, echo with reduced EF (45 to 50%) --Cardiology recommends against  anticoagulation at this time given low A. fib burden and patient's history of substance abuse and poor compliance -Elevated BNP noted, Lasix and metoprolol as ordered -Metoprolol changed back to IV from p.o. due to poor/inconsistent oral intake  10) generalized weakness and deconditioning--PT eval appreciated recommends SNF rehab  Disposition/Need for in-Hospital Stay- patient unable to be discharged at this time due to - requiring BiPAP-for uncompensated respiratory acidosis secondary to hypercapnia  Status is: Inpatient  Remains inpatient appropriate because:remains hypoxic and lethargic requiring BiPAP-for uncompensated respiratory acidosis secondary to hypercapnia   Disposition: The patient is from: Home              Anticipated d/c is to: SNF               Anticipated d/c date is: > 3 days              Patient currently is not medically stable to d/c. Barriers: Not Clinically Stable- remains hypoxic and lethargic requiring BiPAP-for uncompensated respiratory acidosis secondary to hypercapnia  Code Status : full  Procedures:- Intubated 02/14/2020  -Extubated 02/17/2020 Bipap-- 02/19/20  Family Communication:   Discussed with patient's ex- wife/POA   Consults  :  PCCM  DVT Prophylaxis  :  Lovenox -  - SCDs    Lab Results  Component Value Date   PLT 167 02/19/2020    Inpatient Medications  Scheduled Meds: . aspirin  81 mg Per Tube Daily  . chlorhexidine gluconate (MEDLINE KIT)  15 mL Mouth Rinse BID  . Chlorhexidine Gluconate Cloth  6 each Topical Daily  . clopidogrel  75 mg Oral Daily  . docusate  100 mg Oral BID  . enalaprilat  1.25 mg Intravenous Q6H  . enoxaparin (LOVENOX) injection  40 mg Subcutaneous Q24H  . influenza vac split quadrivalent PF  0.5 mL Intramuscular Tomorrow-1000  . insulin aspart  0-5 Units Subcutaneous QHS  . insulin aspart  0-6 Units Subcutaneous TID WC  . ipratropium-albuterol  3 mL Nebulization QID  . mouth rinse  15 mL Mouth Rinse BID  .  metoprolol tartrate  50 mg Oral BID  . pneumococcal 23 valent vaccine  0.5 mL Intramuscular Tomorrow-1000   Continuous Infusions: . dextrose 5 % and 0.45% NaCl 50 mL/hr at 02/20/20 1736  . famotidine (PEPCID) IV Stopped (02/20/20 1117)   PRN Meds:.acetaminophen **OR** acetaminophen, albuterol, diphenhydrAMINE, LORazepam, ondansetron **OR** ondansetron (ZOFRAN) IV, oxyCODONE    Anti-infectives (From admission, onward)   Start     Dose/Rate Route Frequency Ordered Stop   02/15/20 2200  vancomycin (VANCOCIN) IVPB 1000 mg/200 mL premix  Status:  Discontinued        1,000 mg 200 mL/hr over 60 Minutes Intravenous Every 24 hours 02/14/20 2149 02/15/20 1044   02/15/20 1130  ceFAZolin (ANCEF) IVPB 2g/100 mL premix        2 g 200 mL/hr over 30 Minutes Intravenous Every 8 hours 02/15/20 1044 02/20/20 0133   02/14/20 2200  metroNIDAZOLE (FLAGYL) IVPB 500 mg  Status:  Discontinued        500 mg 100 mL/hr over 60 Minutes Intravenous Every 8 hours 02/14/20 2136 02/15/20 1433   02/14/20 2200  vancomycin (VANCOREADY) IVPB 1250 mg/250 mL        1,250 mg 166.7 mL/hr over 90 Minutes Intravenous  Once 02/14/20 2148 02/15/20 0128   02/14/20 2200  ceFEPIme (MAXIPIME) 2 g in sodium chloride 0.9 % 100 mL IVPB  Status:  Discontinued        2 g 200 mL/hr over 30 Minutes Intravenous Every 12 hours 02/14/20 2148 02/15/20 1044       Objective:   Vitals:   02/20/20 1600 02/20/20 1620 02/20/20 1648 02/20/20 1700  BP:   (!) 124/46 (!) 118/57  Pulse: (!) 44  (!) 49 (!) 50  Resp: (!) _0 Temp: (!) 97.1 F (36.2 C)     TempSrc: Axillary     SpO2: 91% 95% 96% 96%  Weight:      Height:        Wt Readings from Last 3 Encounters:  02/19/20 82.5 kg  02/13/20 70.3 kg  12/16/19 80.3 kg    Intake/Output Summary (Last 24 hours) at 02/20/2020 1817 Last data filed at 02/20/2020 1736 Gross per 24 hour  Intake 510.94 ml  Output 4550 ml  Net -4039.06 ml   Physical Exam Gen:-Less sleepy, trying to talk  more   HEENT:-Normocephalic atraumatic, no scleral icterus  Neck- Right IJ central line  Mouth/Nose- Bipap Mask on and off alternating with nasal cannula Lungs--somewhat diminished, no wheezing  CV- S1, S2 normal, regular , CABG scar Abd-  +ve B.Sounds, Abd Soft, not distended,    Extremity/Skin:- No  edema, pedal pulses present  Neuro-Psych-Limited exam as patient is somewhat sleepy    Data Review:   Micro Results Recent Results (from the past 240 hour(s))  Respiratory Panel by RT PCR (Flu A&B, Covid) - Nasopharyngeal Swab     Status: None   Collection Time: 02/13/20  4:51 PM   Specimen: Nasopharyngeal Swab  Result Value Ref Range Status   SARS Coronavirus 2 by RT PCR NEGATIVE NEGATIVE Final    Comment: (NOTE) SARS-CoV-2 target nucleic acids are NOT DETECTED.  The SARS-CoV-2 RNA is generally detectable in upper respiratoy specimens during the acute phase of infection. The lowest concentration of SARS-CoV-2 viral copies this assay can detect is 131 copies/mL. A negative result does not preclude SARS-Cov-2 infection and should not be used as the sole basis for treatment or other patient management decisions. A negative result may occur with  improper specimen collection/handling, submission of specimen other than nasopharyngeal swab, presence of viral mutation(s) within the areas targeted by this assay, and inadequate number of viral copies (<131 copies/mL). A negative result must be combined with clinical observations, patient history, and epidemiological information. The expected result is Negative.  Fact Sheet for Patients:  PinkCheek.be  Fact Sheet for Healthcare Providers:  GravelBags.it  This test is no t yet approved or cleared by the [place] FDA and  has been authorized for detection and/or diagnosis of SARS-CoV-2 by FDA under an Emergency Use Authorization (EUA). This EUA will remain  in effect  (meaning this test can be used) for the duration of the COVID-19 declaration under Section 564(b)(1) of the Act, 21 U.S.C. section 360bbb-3(b)(1), unless the authorization is terminated or revoked sooner.     Influenza A by PCR NEGATIVE NEGATIVE Final   Influenza B by PCR NEGATIVE NEGATIVE Final    Comment: (NOTE) The Xpert Xpress SARS-CoV-2/FLU/RSV assay is intended as an aid in  the diagnosis of influenza from Nasopharyngeal swab specimens and  should not be used as a sole basis for treatment. Nasal washings and  aspirates are unacceptable for Xpert Xpress SARS-CoV-2/FLU/RSV  testing.  Fact Sheet for Patients: PinkCheek.be  Fact Sheet for Healthcare Providers:  GravelBags.it  This test is not yet approved or cleared by the [place] and  has been authorized for detection and/or diagnosis of SARS-CoV-2 by  FDA under an Emergency Use Authorization (EUA). This EUA will remain  in effect (meaning this test can be used) for the duration of the  Covid-19 declaration under Section 564(b)(1) of the Act, 21  U.S.C. section 360bbb-3(b)(1), unless the authorization is  terminated or revoked. Performed at [facility], [address] [place], [place] [address]   Culture, blood (Routine x 2)     Status: Abnormal   Collection Time: 02/13/20  4:52 PM   Specimen: Right Antecubital; Blood  Result Value Ref Range Status   Specimen Description   Final    RIGHT ANTECUBITAL Performed at [facility], [address] [place], [place] [address]    Special Requests   Final    BOTTLES DRAWN AEROBIC AND ANAEROBIC Blood Culture adequate volume Performed at [facility], [address] [place], [place] [address]    Culture  Setup Time   Final    GRAM POSITIVE COCCI AEROBIC BOTTLE ONLY CRITICAL RESULT CALLED TO, READ BACK BY AND VERIFIED WITH: G. Coffee PharmD 9:35 02/15/20 ([email or web address])    Culture (A)  Final    STAPHYLOCOCCUS  EPIDERMIDIS THE SIGNIFICANCE OF ISOLATING THIS ORGANISM FROM A SINGLE SET OF BLOOD CULTURES WHEN MULTIPLE SETS ARE DRAWN IS UNCERTAIN. PLEASE NOTIFY THE MICROBIOLOGY DEPARTMENT WITHIN ONE WEEK IF SPECIATION AND SENSITIVITIES ARE REQUIRED. Performed at [facility], [place] [address] [place], [place] [address]    Report Status 02/16/2020 FINAL  Final  Blood Culture ID Panel (Reflexed)     Status: Abnormal   Collection Time: 02/13/20  4:52 PM  Result Value Ref Range Status   Enterococcus faecalis NOT DETECTED NOT DETECTED Final   Enterococcus Faecium NOT DETECTED NOT DETECTED Final   Listeria monocytogenes NOT DETECTED NOT DETECTED Final   Staphylococcus species DETECTED (A) NOT DETECTED Final    Comment: CRITICAL RESULT CALLED TO, READ BACK BY AND VERIFIED WITH: G. Coffee PharmD 9:35 02/15/20 ([email or web address])    Staphylococcus aureus (BCID) NOT DETECTED NOT DETECTED Final   Staphylococcus epidermidis DETECTED (A) NOT DETECTED Final    Comment: CRITICAL RESULT CALLED TO, READ BACK BY AND VERIFIED WITH: G. Coffee PharmD 9:35 02/15/20 (wilsonm)    Staphylococcus lugdunensis NOT DETECTED NOT DETECTED Final   Streptococcus species NOT DETECTED NOT DETECTED Final   Streptococcus agalactiae NOT DETECTED NOT DETECTED Final   Streptococcus pneumoniae NOT DETECTED NOT DETECTED Final   Streptococcus pyogenes NOT DETECTED NOT DETECTED Final   A.calcoaceticus-baumannii NOT DETECTED NOT DETECTED Final   Bacteroides fragilis NOT DETECTED NOT DETECTED Final   Enterobacterales NOT DETECTED NOT DETECTED Final   Enterobacter cloacae complex NOT DETECTED NOT DETECTED Final   Escherichia coli NOT DETECTED NOT DETECTED Final   Klebsiella aerogenes NOT DETECTED NOT DETECTED Final   Klebsiella oxytoca NOT DETECTED NOT DETECTED Final   Klebsiella pneumoniae NOT DETECTED NOT DETECTED Final   Proteus species NOT DETECTED NOT DETECTED Final   Salmonella species NOT DETECTED NOT DETECTED Final   Serratia marcescens  NOT DETECTED NOT DETECTED Final   Haemophilus influenzae NOT DETECTED NOT DETECTED Final   Neisseria meningitidis NOT DETECTED NOT DETECTED Final   Pseudomonas aeruginosa NOT DETECTED NOT DETECTED Final   Stenotrophomonas maltophilia NOT DETECTED NOT DETECTED Final   Candida albicans NOT DETECTED NOT DETECTED Final   Candida auris NOT DETECTED NOT DETECTED Final  Candida glabrata NOT DETECTED NOT DETECTED Final   Candida krusei NOT DETECTED NOT DETECTED Final   Candida parapsilosis NOT DETECTED NOT DETECTED Final   Candida tropicalis NOT DETECTED NOT DETECTED Final   Cryptococcus neoformans/gattii NOT DETECTED NOT DETECTED Final   Methicillin resistance mecA/C NOT DETECTED NOT DETECTED Final    Comment: Performed at [facility], [place] [address] [place], [place] [address]  Culture, blood (Routine x 2)     Status: None   Collection Time: 02/13/20  5:44 PM   Specimen: Right Antecubital; Blood  Result Value Ref Range Status   Specimen Description RIGHT ANTECUBITAL  Final   Special Requests   Final    BOTTLES DRAWN AEROBIC AND ANAEROBIC Blood Culture adequate volume   Culture   Final    NO GROWTH 6 DAYS Performed at [facility], [address] [place], [place] [address]    Report Status 02/19/2020 FINAL  Final  Culture, Urine     Status: None   Collection Time: 02/14/20  9:26 PM   Specimen: Urine, Clean Catch  Result Value Ref Range Status   Specimen Description   Final    URINE, CLEAN CATCH Performed at [facility], [address], [place], [place] [address]    Special Requests   Final    NONE Performed at [facility], [address] [place], [place] [address]    Culture   Final    NO GROWTH Performed at [facility], [place] [address], [place], [place] [address]    Report Status 02/16/2020 FINAL  Final  Culture, blood (routine x 2)     Status: None   Collection Time: 02/14/20  9:44 PM   Specimen: BLOOD RIGHT HAND  Result Value Ref Range Status   Specimen  Description BLOOD RIGHT HAND  Final   Special Requests   Final    BOTTLES DRAWN AEROBIC ONLY Blood Culture adequate volume   Culture   Final    NO GROWTH 5 DAYS Performed at [facility], [address] [place], [place] [address]    Report Status 02/19/2020 FINAL  Final  Culture, blood (routine x 2)     Status: None   Collection Time: 02/14/20  9:58 PM   Specimen: BLOOD LEFT HAND  Result Value Ref Range Status   Specimen Description BLOOD LEFT HAND  Final   Special Requests   Final    BOTTLES DRAWN AEROBIC ONLY Blood Culture adequate volume   Culture   Final    NO GROWTH 5 DAYS Performed at [facility], [address] [place],  [address]    Report Status 02/19/2020 FINAL  Final  MRSA PCR Screening     Status: None   Collection Time: 02/15/20 12:05 AM   Specimen: Nasal Mucosa; Nasopharyngeal  Result Value Ref Range Status   MRSA by PCR NEGATIVE NEGATIVE Final    Comment:        The GeneXpert MRSA Assay (FDA approved for NASAL specimens only), is one component of a comprehensive MRSA colonization surveillance program. It is not intended to diagnose MRSA infection nor to guide or monitor treatment for MRSA infections. Performed at [facility], [address], [place],  [address]   Culture, blood (Routine X 2) w Reflex to ID Panel     Status: None (Preliminary result)   Collection Time: 02/16/20  2:33 PM   Specimen: BLOOD RIGHT HAND  Result Value Ref Range Status   Specimen Description BLOOD RIGHT HAND  Final   Special Requests   Final    BOTTLES DRAWN AEROBIC AND ANAEROBIC Blood Culture adequate volume   Culture   Final    NO GROWTH 4 DAYS Performed at [facility], [address], [place], [place] [address]    Report Status PENDING  Incomplete  Culture, blood (Routine X 2) w Reflex to ID Panel     Status: None (Preliminary result)   Collection Time: 02/16/20  2:33 PM   Specimen: BLOOD LEFT HAND  Result Value Ref Range Status   Specimen Description  BLOOD LEFT HAND  Final   Special Requests   Final    BOTTLES DRAWN AEROBIC AND ANAEROBIC Blood Culture results may not be optimal due to an inadequate volume of blood received in culture bottles   Culture   Final    NO GROWTH 4 DAYS Performed at [facility], [address], [place], [place] [address]    Report Status PENDING  Incomplete   Radiology Reports CT HEAD WO CONTRAST  Result Date: 02/18/2020 CLINICAL DATA:  Acute neuro deficit.  Stroke suspected. EXAM: CT HEAD WITHOUT CONTRAST TECHNIQUE: Contiguous axial images were obtained from the base of the skull through the vertex without intravenous contrast. COMPARISON:  02/23/2020.  CT head 06/29/2019 FINDINGS: Brain: Hypodensity in the right temporal and parietal lobe unchanged compatible with chronic infarct. Negative for acute infarct, hemorrhage, mass. Negative for hydrocephalus. Vascular: Negative for hyperdense vessel Skull: Negative Sinuses/Orbits: Paranasal sinuses clear.  Negative orbit Other: None IMPRESSION: Chronic right MCA infarct. No acute abnormality and no change from the prior CT. Electronically Signed   By: Franchot Gallo M.D.   On: 02/18/2020 15:03   CT Head Wo Contrast  Result Date: 02/13/2020 CLINICAL DATA:  Unresponsive EXAM: CT HEAD WITHOUT CONTRAST TECHNIQUE: Contiguous axial images were obtained from the base of the skull through the vertex without intravenous contrast. COMPARISON:  06/29/2019 FINDINGS: Brain: There again noted changes consistent with prior encephalomalacia from ischemic event in the distribution of the right middle cerebral artery involving the right temporal and parietal lobes. The overall appearance is stable. No findings to suggest acute hemorrhage, acute infarction or space-occupying mass lesion are noted. Mild atrophic changes are again seen. Vascular: No hyperdense vessel or unexpected calcification. Skull: Normal. Negative for fracture or focal lesion. Sinuses/Orbits: No acute finding. Other: None.  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 No acute abnormality noted. Electronically Signed   By: Inez Catalina M.D.   On: 02/13/2020 21:30   NM Pulmonary Perfusion  Result Date: 02/15/2020 CLINICAL DATA:  Respiratory failure EXAM: NUCLEAR MEDICINE PERFUSION LUNG SCAN TECHNIQUE: Perfusion images were obtained in multiple projections after intravenous injection of radiopharmaceutical. Ventilation scans intentionally deferred if perfusion scan and chest x-ray adequate for interpretation during COVID 19 epidemic. RADIOPHARMACEUTICALS:  4.4 mCi Tc-64mMAA IV COMPARISON:  Chest radiograph 02/15/2020 FINDINGS: Inhomogeneous tracer distribution in a nonsegmental pattern in both lungs, pattern favoring parenchymal lung disease. Tiny subsegmental defects at lung bases. Findings are not suspicious for pulmonary embolism. IMPRESSION: No scintigraphic evidence of pulmonary embolism. Electronically Signed   By: MLavonia DanaM.D.   On: 02/15/2020 12:40   UK[place]Venous Img Lower Unilateral Left (DVT)  Result Date: 02/15/2020 CLINICAL DATA:  Left lower extremity edema. EXAM: LEFT LOWER EXTREMITY VENOUS DOPPLER ULTRASOUND TECHNIQUE: Gray-scale sonography with graded compression, as well as color Doppler and duplex ultrasound were performed to evaluate the lower extremity deep venous systems from the level  of the common femoral vein and including the common femoral, femoral, profunda femoral, popliteal and calf veins including the posterior tibial, peroneal and gastrocnemius veins when visible. The superficial great saphenous vein was also interrogated. Spectral Doppler was utilized to evaluate flow at rest and with distal augmentation maneuvers in the common femoral, femoral and popliteal veins. COMPARISON:  None. FINDINGS: Contralateral Common Femoral Vein: Respiratory phasicity is normal and symmetric with the symptomatic side. No evidence of thrombus. Normal  compressibility. Common Femoral Vein: No evidence of thrombus. Normal compressibility, respiratory phasicity and response to augmentation. Saphenofemoral Junction: No evidence of thrombus. Normal compressibility and flow on color Doppler imaging. Profunda Femoral Vein: No evidence of thrombus. Normal compressibility and flow on color Doppler imaging. Femoral Vein: No evidence of thrombus. Normal compressibility, respiratory phasicity and response to augmentation. Popliteal Vein: No evidence of thrombus. Normal compressibility, respiratory phasicity and response to augmentation. Calf Veins: No evidence of thrombus. Normal compressibility and flow on color Doppler imaging. Superficial Great Saphenous Vein: No evidence of thrombus. Normal compressibility. Venous Reflux:  None. Other Findings: No evidence of superficial thrombophlebitis or abnormal fluid collection. IMPRESSION: No evidence of left lower extremity deep venous thrombosis. Electronically Signed   By: Aletta Edouard M.D.   On: 02/15/2020 10:32   DG Chest Port 1 View  Result Date: 02/18/2020 CLINICAL DATA:  Respiratory failure with hypoxia. EXAM: PORTABLE CHEST 1 VIEW COMPARISON:  02/16/2020 FINDINGS: Endotracheal tube removed.  NG tube removed. Lungs well aerated and clear without infiltrate or effusion. Negative for heart failure. IMPRESSION: No active disease. Electronically Signed   By: Franchot Gallo M.D.   On: 02/18/2020 15:08   DG CHEST PORT 1 VIEW  Result Date: 02/16/2020 CLINICAL DATA:  Recent aspiration, initial encounter EXAM: PORTABLE CHEST 1 VIEW COMPARISON:  02/15/2020 FINDINGS: Cardiac shadow is stable. The lungs are hyperinflated but clear. Gastric catheter is noted within the stomach. Endotracheal tube is seen extending to the level of the aortic arch in satisfactory position. No confluent infiltrate is noted. Mild vascular congestion remains. IMPRESSION: Stable vascular congestion.  No new focal abnormality is noted. Electronically  Signed   By: Inez Catalina M.D.   On: 02/16/2020 16:04   Portable Chest xray  Result Date: 02/15/2020 CLINICAL DATA:  Acute respiratory failure. EXAM: PORTABLE CHEST 1 VIEW COMPARISON:  02/14/2020. FINDINGS: The ETT tip is stable above the carina. There is a nasogastric tube with tip and side port below the GE junction. Heart size appears normal. No pleural effusion or airspace consolidation. Pulmonary vascular congestion noted. No overt edema. IMPRESSION: Pulmonary vascular congestion. Electronically Signed   By: Kerby Moors M.D.   On: 02/15/2020 05:41   DG Chest Portable 1 View  Result Date: 02/14/2020 CLINICAL DATA:  Check gastrostomy catheter EXAM: PORTABLE CHEST 1 VIEW COMPARISON:  02/14/2020 FINDINGS: Gastric catheter is been withdrawn and now lies with the tip in the stomach. Proximal side port is noted at the gastroesophageal junction. This could be advanced a few cm deeper into the stomach. IMPRESSION: Gastric catheter as described. Electronically Signed   By: Inez Catalina M.D.   On: 02/14/2020 21:19   DG Chest Portable 1 View  Result Date: 02/14/2020 CLINICAL DATA:  Hypoxia check gastric catheter placement EXAM: PORTABLE CHEST 1 VIEW COMPARISON:  02/14/2020 FINDINGS: Endotracheal tube and gastric catheter are again seen. The gastric catheter has been advanced and is now coiled within the stomach extending into the proximal duodenum. The lungs are hyperinflated consistent with COPD. No bony abnormality  is noted. IMPRESSION: Gastric catheter as described. COPD without acute abnormality. Electronically Signed   By: Inez Catalina M.D.   On: 02/14/2020 21:14   DG Chest Portable 1 View  Result Date: 02/14/2020 CLINICAL DATA:  Intubated, loss of consciousness, COPD EXAM: PORTABLE CHEST 1 VIEW COMPARISON:  02/14/2020 FINDINGS: 2 frontal views of the chest demonstrate endotracheal tube overlying tracheal air column, tip approximately 3.8 cm above carina. Enteric catheter tip and side port project  over the distal thoracic esophagus. Cardiac silhouette is unremarkable. Background emphysema again noted without airspace disease, effusion, or pneumothorax. No acute bony abnormalities. IMPRESSION: 1. Enteric catheter tip and side port projecting over the distal thoracic esophagus. Recommend advancing at least 7 cm to ensure side port placement within the gastric lumen. 2. No complication after intubation. 3. Emphysema. Electronically Signed   By: Randa Ngo M.D.   On: 02/14/2020 18:40   DG Chest Portable 1 View  Result Date: 02/14/2020 CLINICAL DATA:  Shortness of breath, history of COPD EXAM: PORTABLE CHEST 1 VIEW COMPARISON:  02/13/2020 FINDINGS: Emphysema and chronic interstitial changes. No new consolidation or edema. No pleural effusion or pneumothorax. Stable cardiomediastinal contours. IMPRESSION: COPD without acute process in the chest. Electronically Signed   By: Macy Mis M.D.   On: 02/14/2020 17:32   DG Chest Portable 1 View  Result Date: 02/13/2020 CLINICAL DATA:  Hypotension. EXAM: PORTABLE CHEST 1 VIEW COMPARISON:  Prior chest radiographs 12/16/2019 and earlier. FINDINGS: Heart size within normal limits. Prior median sternotomy/CABG. Aortic atherosclerosis. Unchanged nonspecific chronic prominence of the interstitial lung markings. No appreciable airspace consolidation or frank pulmonary edema. No evidence of pleural effusion or pneumothorax. No acute bony abnormality identified. ACDF hardware within the lower cervical spine. IMPRESSION: No evidence of acute cardiopulmonary abnormality. Chronic nonspecific prominence of the interstitial lung markings, unchanged. Prior median sternotomy/CABG. Aortic Atherosclerosis (ICD10-I70.0). Electronically Signed   By: Kellie Simmering DO   On: 02/13/2020 18:15   ECHOCARDIOGRAM COMPLETE  Result Date: 02/16/2020    ECHOCARDIOGRAM REPORT   Patient Name:   MCADOO MUZQUIZ Date of Exam: 02/16/2020 Medical Rec #:  [id number]         Height:       73.0  in Accession #:    [id number]        Weight:       190.3 lb Date of Birth:  Oct 01, 1960         BSA:          2.106 m Patient Age:    84 years          BP:           93/64 mmHg Patient Gender: M                 HR:           52 bpm. Exam Location:  [facility] Procedure: 2D Echo Indications:    Bacteremia 790.7 / R78.81  History:        Patient has prior history of Echocardiogram examinations, most                 recent 06/01/2019. CAD, COPD; Risk Factors:Current Smoker and                 Dyslipidemia. Opioid dependence.  Sonographer:    Leavy Cella RDCS (AE) Referring Phys: [id number] Staria Birkhead IMPRESSIONS  1. Left ventricular ejection fraction, by estimation, is 45 to 50%. The left ventricle has mildly decreased function.  The left ventricle has no regional wall motion abnormalities. There is mild left ventricular hypertrophy. Left ventricular diastolic parameters are indeterminate.  2. Ventricular septum is flattened in systole consistent with RV pressure overload. . Right ventricular systolic function is severely reduced. The right ventricular size is severely enlarged.  3. Right atrial size was moderately dilated.  4. The mitral valve is normal in structure. No evidence of mitral valve regurgitation. No evidence of mitral stenosis.  5. AV and LVOT Dopplers are off axis and likely underestimated. Visually there is no significant stenosis or regurgitation. . The aortic valve is tricuspid. Aortic valve regurgitation is not visualized. No aortic stenosis is present. FINDINGS  Left Ventricle: Left ventricular ejection fraction, by estimation, is 45 to 50%. The left ventricle has mildly decreased function. The left ventricle has no regional wall motion abnormalities. The left ventricular internal cavity size was normal in size. There is mild left ventricular hypertrophy. Left ventricular diastolic parameters are indeterminate. Right Ventricle: Ventricular septum is flattened in systole consistent with RV pressure  overload. The right ventricular size is severely enlarged. Right vetricular wall thickness was not assessed. Right ventricular systolic function is severely reduced. Left Atrium: Left atrial size was normal in size. Right Atrium: Right atrial size was moderately dilated. Pericardium: There is no evidence of pericardial effusion. Mitral Valve: The mitral valve is normal in structure. No evidence of mitral valve regurgitation. No evidence of mitral valve stenosis. Tricuspid Valve: The tricuspid valve is normal in structure. Tricuspid valve regurgitation is not demonstrated. No evidence of tricuspid stenosis. Aortic Valve: AV and LVOT Dopplers are off axis and likely underestimated. Visually there is no significant stenosis or regurgitation. The aortic valve is tricuspid. Aortic valve regurgitation is not visualized. No aortic stenosis is present. Pulmonic Valve: The pulmonic valve was not well visualized. Pulmonic valve regurgitation is not visualized. No evidence of pulmonic stenosis. Aorta: The aortic root is normal in size and structure. Pulmonary Artery: Indeterminant PASP, inadequate TR jet. Venous: IVC assessment for right atrial pressure unable to be performed due to mechanical ventilation. IAS/Shunts: No atrial level shunt detected by color flow Doppler.  LEFT VENTRICLE PLAX 2D LVIDd:         4.16 cm  Diastology LVIDs:         3.30 cm  LV e' medial:    6.31 cm/s LV PW:         1.35 cm  LV E/e' medial:  9.3 LV IVS:        1.00 cm  LV e' lateral:   7.51 cm/s LVOT diam:     1.90 cm  LV E/e' lateral: 7.8 LVOT Area:     2.84 cm  RIGHT VENTRICLE TAPSE (M-mode): 1.0 cm LEFT ATRIUM             Index       RIGHT ATRIUM           Index LA diam:        2.60 cm 1.23 cm/m  RA Area:     15.60 cm LA Vol (A2C):   23.8 ml 11.30 ml/m RA Volume:   46.60 ml  22.12 ml/m LA Vol (A4C):   26.7 ml 12.68 ml/m LA Biplane Vol: 26.0 ml 12.34 ml/m   AORTA Ao Root diam: 3.10 cm MITRAL VALVE               TRICUSPID VALVE MV Area  (PHT): 3.37 cm    TR Peak grad:   17.0 mmHg  MV Decel Time: 225 msec    TR Vmax:        206.00 cm/s MV E velocity: 58.70 cm/s MV A velocity: 42.40 cm/s  SHUNTS MV E/A ratio:  1.38        Systemic Diam: 1.90 cm Carlyle Dolly MD Electronically signed by Carlyle Dolly MD Signature Date/Time: 02/16/2020/12:32:09 PM    Final    CBC Recent Labs  Lab 02/14/20 1723 02/15/20 0142 02/16/20 0804 02/17/20 0601 02/19/20 0439  WBC 19.6* 19.4* 18.1* 17.7* 13.4*  HGB 17.1* 16.6 15.9 16.7 16.7  HCT 58.5* 54.8* 48.9 51.2 56.1*  PLT 352 213 226 240 167  MCV 96.4 93.5 86.4 87.1 92.4  MCH 28.2 28.3 28.1 28.4 27.5  MCHC 29.2* 30.3 32.5 32.6 29.8*  RDW 19.1* 19.3* 19.6* 19.6* 20.4*  LYMPHSABS 0.8  --   --   --   --   MONOABS 1.8*  --   --   --   --   EOSABS 0.0  --   --   --   --   BASOSABS 0.0  --   --   --   --     Chemistries  Recent Labs  Lab 02/14/20 1723 02/15/20 0142 02/16/20 0804 02/17/20 0601 02/19/20 0439  NA 135 136 138 139 147*  K 4.2 4.4 3.1* 4.1 3.8  CL 91* 98 104 104 105  CO2 32 _0 33*  GLUCOSE 145* 143* 128* 104* 107*  BUN 40* 41* 40* 38* 29*  CREATININE 1.85* 1.47* 1.06 0.96 0.76  CALCIUM 10.6* 9.8 9.1 9.3 9.9  MG  --   --  1.9  --   --   AST 294*  --  490* 841* 426*  ALT 292*  --  744* 559* 175*  ALKPHOS 96  --  74 80 89  BILITOT 1.2  --  0.9 1.0 1.5*    No results for input(s): CHOL, HDL, LDLCALC, TRIG, CHOLHDL, LDLDIRECT in the last 72 hours. Lab Results  Component Value Date   HGBA1C 6.9 (H) 02/15/2020    No results for input(s): TSH, T4TOTAL, T3FREE, THYROIDAB in the last 72 hours.  Invalid input(s): FREET3 ------------------------------------------------------------------------------------------------------------------ No results for input(s): VITAMINB12, FOLATE, FERRITIN, TIBC, IRON, RETICCTPCT in the last 72 hours.  Coagulation profile No results for input(s): INR, PROTIME in the last 168 hours.  No results for input(s): DDIMER in the last 72  hours.  Cardiac Enzymes No results for input(s): CKMB, TROPONINI, MYOGLOBIN in the last 168 hours.  Invalid input(s): CK ------------------------------------------------------------------------------------------------------------------    Component Value Date/Time   BNP 867.0 (H) 02/18/2020 0370   Roxan Hockey M.D on 02/20/2020 at 6:17 PM  Go to [email or web address] - for contact info  [facility] - Office  [phone]

## 2020-02-20 NOTE — Progress Notes (Addendum)
 Progress Note  Patient Name: Antonio Ramirez Date of Encounter: 02/20/2020  Primary Cardiologist: Branch, Jonathan, MD  Subjective   Patient on BiPAP.  Protective mitts on hands.  Has been intermittently agitated per nursing, also not consistently able to take oral medications at this point.  Inpatient Medications    Scheduled Meds: . aspirin  81 mg Per Tube Daily  . chlorhexidine gluconate (MEDLINE KIT)  15 mL Mouth Rinse BID  . Chlorhexidine Gluconate Cloth  6 each Topical Daily  . clopidogrel  75 mg Oral Daily  . docusate  100 mg Oral BID  . enoxaparin (LOVENOX) injection  40 mg Subcutaneous Q24H  . influenza vac split quadrivalent PF  0.5 mL Intramuscular Tomorrow-1000  . insulin aspart  0-5 Units Subcutaneous QHS  . insulin aspart  0-6 Units Subcutaneous TID WC  . ipratropium-albuterol  3 mL Nebulization QID  . mouth rinse  15 mL Mouth Rinse 10 times per day  . metoprolol tartrate  50 mg Oral BID  . pneumococcal 23 valent vaccine  0.5 mL Intramuscular Tomorrow-1000   Continuous Infusions: . dextrose 5 % and 0.45% NaCl 50 mL/hr at 02/19/20 2228  . famotidine (PEPCID) IV 20 mg (02/19/20 2338)   PRN Meds: acetaminophen **OR** acetaminophen, albuterol, diphenhydrAMINE, LORazepam, ondansetron **OR** ondansetron (ZOFRAN) IV, oxyCODONE   Vital Signs    Vitals:   02/20/20 0400 02/20/20 0500 02/20/20 0602 02/20/20 0700  BP: (!) 168/89 (!) 178/76 (!) 172/71 (!) 160/66  Pulse: (!) 58 (!) 47 (!) 52 (!) 56  Resp: 18 18 18 16  Temp:   98.9 F (37.2 C) 100 F (37.8 C)  TempSrc:   Axillary Axillary  SpO2: 97% 95% 96% 96%  Weight:      Height:        Intake/Output Summary (Last 24 hours) at 02/20/2020 0820 Last data filed at 02/20/2020 0600 Gross per 24 hour  Intake 1865.52 ml  Output 2050 ml  Net -184.48 ml   Filed Weights   02/16/20 0343 02/17/20 0630 02/19/20 0500  Weight: 86.3 kg 86.2 kg 82.5 kg    Telemetry    Sinus rhythm with PACs and PVCs.  Bursts of  rapid atrial fibrillation flutter noted.  Personally reviewed.  ECG    An ECG dated 02/20/2020 was personally reviewed today and demonstrated:  Sinus rhythm with right bundle branch block.  Physical Exam   GEN:  Currently on BiPAP.   Neck: No obvious JVD. Cardiac: RRR, no gallop.  Respiratory:  Decreased breath sounds without wheezing. GI: Soft, nontender, bowel sounds present. MS: No edema; No deformity.  Protective mitts on hands.  Labs    Chemistry Recent Labs  Lab 02/16/20 0804 02/17/20 0601 02/19/20 0439  NA 138 139 147*  K 3.1* 4.1 3.8  CL 104 104 105  CO2 26 27 33*  GLUCOSE 128* 104* 107*  BUN 40* 38* 29*  CREATININE 1.06 0.96 0.76  CALCIUM 9.1 9.3 9.9  PROT 5.3* 5.4* 6.2*  ALBUMIN 2.6* 2.7* 3.2*  AST 490* 841* 426*  ALT 744* 559* 175*  ALKPHOS 74 80 89  BILITOT 0.9 1.0 1.5*  GFRNONAA >60 >60 >60  ANIONGAP 8 8 9     Hematology Recent Labs  Lab 02/16/20 0804 02/17/20 0601 02/19/20 0439  WBC 18.1* 17.7* 13.4*  RBC 5.66 5.88* 6.07*  HGB 15.9 16.7 16.7  HCT 48.9 51.2 56.1*  MCV 86.4 87.1 92.4  MCH 28.1 28.4 27.5  MCHC 32.5 32.6 29.8*  RDW 19.6*   19.6* 20.4*  PLT 226 240 167    BNP Recent Labs  Lab 02/18/20 0358  BNP 867.0*     Radiology    CT HEAD WO CONTRAST  Result Date: 02/18/2020 CLINICAL DATA:  Acute neuro deficit.  Stroke suspected. EXAM: CT HEAD WITHOUT CONTRAST TECHNIQUE: Contiguous axial images were obtained from the base of the skull through the vertex without intravenous contrast. COMPARISON:  02/23/2020.  CT head 06/29/2019 FINDINGS: Brain: Hypodensity in the right temporal and parietal lobe unchanged compatible with chronic infarct. Negative for acute infarct, hemorrhage, mass. Negative for hydrocephalus. Vascular: Negative for hyperdense vessel Skull: Negative Sinuses/Orbits: Paranasal sinuses clear.  Negative orbit Other: None IMPRESSION: Chronic right MCA infarct. No acute abnormality and no change from the prior CT. Electronically  Signed   By: Charles  Clark M.D.   On: 02/18/2020 15:03   DG Chest Port 1 View  Result Date: 02/18/2020 CLINICAL DATA:  Respiratory failure with hypoxia. EXAM: PORTABLE CHEST 1 VIEW COMPARISON:  02/16/2020 FINDINGS: Endotracheal tube removed.  NG tube removed. Lungs well aerated and clear without infiltrate or effusion. Negative for heart failure. IMPRESSION: No active disease. Electronically Signed   By: Charles  Clark M.D.   On: 02/18/2020 15:08    Cardiac Studies   Echocardiogram 02/16/2020: 1. Left ventricular ejection fraction, by estimation, is 45 to 50%. The  left ventricle has mildly decreased function. The left ventricle has no  regional wall motion abnormalities. There is mild left ventricular  hypertrophy. Left ventricular diastolic  parameters are indeterminate.  2. Ventricular septum is flattened in systole consistent with RV pressure  overload. . Right ventricular systolic function is severely reduced. The  right ventricular size is severely enlarged.  3. Right atrial size was moderately dilated.  4. The mitral valve is normal in structure. No evidence of mitral valve  regurgitation. No evidence of mitral stenosis.  5. AV and LVOT Dopplers are off axis and likely underestimated. Visually  there is no significant stenosis or regurgitation. . The aortic valve is  tricuspid. Aortic valve regurgitation is not visualized. No aortic  stenosis is present.   Patient Profile     59 y.o. male with a history of CAD status post CABG in 2006, COPD, hyperlipidemia, tobacco and substance use, now admitted with acute hypoxic/hypercarbic respiratory failure with sepsis.  Assessment & Plan    1.  Recurrent episodes of SVT, likely rapid atrial fibrillation versus atypical atrial flutter per recent review of telemetry.  This is complicated by acute illness, recent agitation, also inconsistent oral medication delivery.  CHA2DS2-VASc score is 2.  2.  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  LVEF 45 to 50%.  He has severe RV dysfunction more likely related to his pulmonary disease.  Continue aspirin and Plavix when able to take oral medication, switching to IV beta-blocker.  Start IV enalaprilat.  Not requiring diuretics at this time.   2.  Acute hypoxic/hypercarbic respiratory failure with COPD and opiate abuse.  Initially requiring mechanical ventilation, more recently on BiPAP.  Post extubation chest x-ray without acute infiltrate.  3.  Agitation and altered mental status in the setting of problem #2.  Head CT from November 6 shows chronic right MCA infarct, no obvious acute findings.  Ammonia level 26.  4.  Transaminitis.  Holding Lipitor.  5.  Staph epidermidis bacteremia afebrile.  Completed course of Ancef.  Switch from oral Lopressor back to IV Lopressor 5 mg every 6 hours given current   mental status.  Start IV enalaprilat at low dose.  When able to take p.o.'s can resume aspirin and Plavix.  Would still try and avoid amiodarone at this point in light of COPD and also transaminitis.  If arrhythmias become more difficult to manage this can be reconsidered.  He is not an optimal candidate for anticoagulation particularly with history of substance abuse.  We will continue to follow.  Signed, Rozann Lesches, MD  02/20/2020, 8:20 AM

## 2020-02-20 NOTE — Plan of Care (Signed)
  Problem: Acute Rehab PT Goals(only PT should resolve) Goal: Pt will Roll Supine to Side Outcome: Progressing Flowsheets (Taken 02/20/2020 1222) Pt will Roll Supine to Side: with min assist Goal: Pt Will Go Supine/Side To Sit Outcome: Progressing Flowsheets (Taken 02/20/2020 1222) Pt will go Supine/Side to Sit:  with minimal assist  with moderate assist Goal: Pt Will Go Sit To Supine/Side Outcome: Progressing Flowsheets (Taken 02/20/2020 1222) Pt will go Sit to Supine/Side:  with minimal assist  with min guard assist Goal: Patient Will Perform Sitting Balance Outcome: Progressing Flowsheets (Taken 02/20/2020 1222) Patient will perform sitting balance:  with min guard assist  with minimal assist Goal: Patient Will Transfer Sit To/From Stand Outcome: Progressing Flowsheets (Taken 02/20/2020 1222) Patient will transfer sit to/from stand: with moderate assist Goal: Pt Will Transfer Bed To Chair/Chair To Bed Outcome: Progressing Flowsheets (Taken 02/20/2020 1222) Pt will Transfer Bed to Chair/Chair to Bed: with mod assist Goal: Pt Will Ambulate Outcome: Progressing Flowsheets (Taken 02/20/2020 1222) Pt will Ambulate:  15 feet  with moderate assist  with rolling walker   12:22 PM, 02/20/20 Lonell Grandchild, MPT Physical Therapist with [facility] [phone] office [phone] mobile phone

## 2020-02-20 NOTE — Progress Notes (Signed)
Patient heart rate went to 190's. Lasted one minute and returned to baseline. Heart rate currently in 50's. Notified Hospitalist on call.

## 2020-02-20 NOTE — Progress Notes (Signed)
Pt was able to take all PO medication crushed in ice cream as applesauce was unavailable with no difficulty. Pt also was able to drink water with no difficulty as well. Only medication pt had an issue with was the Colace liquid as he got a little choked on the consistency of it.  Pt is alert to self and he states he knows where he is, but will not tell us where. Will continue to monitor and assess PO intake.

## 2020-02-20 NOTE — Evaluation (Signed)
Physical Therapy Evaluation Patient Details Name: Antonio Ramirez MRN: [id number] DOB: [birth year] Today's Date: 02/20/2020   History of Present Illness  Antonio Ramirez is a 59 y.o. male with medical history significant for opiate dependence, COPD, carotid artery disease, depression.Patient was brought to the ED reports of altered mental status.  He came yesterday for same issue, but this resolved and was back to baseline hence was discharged home.  On my evaluation patient is intubated.  History is obtained from chart review and talking to patient's significant other Antonio Ramirez on the phone.Patient reports that this morning, patient level of consciousness started declining again.  He did not appear short of breath and did not complain of difficulty breathing.  No cough.  No pain with urination.  She denies abuse of his pain medications.  She reports that patient last took his long-acting oxycodone at 3 AM early this morning and has not taken any since then.  He takes as needed oxycodone maybe once to twice a day, has not taken any today.  He has chronic back pain.  No alcohol abuse.    Clinical Impression  Patient demonstrates slow labored movement for sitting up at bedside, poor trunk control with frequent leaning/falling forward, unable to stand due to weakness and required Max/total assist to reposition when put back to bed.  Patient will benefit from continued physical therapy in hospital and recommended venue below to increase strength, balance, endurance for safe ADLs and gait.     Follow Up Recommendations SNF    Equipment Recommendations  None recommended by PT    Recommendations for Other Services       Precautions / Restrictions Precautions Precautions: Fall Restrictions Weight Bearing Restrictions: No      Mobility  Bed Mobility Overal bed mobility: Needs Assistance Bed Mobility: Supine to Sit;Sit to Supine     Supine to sit: Max assist Sit to supine: Mod assist;Max  assist   General bed mobility comments: slow labored movement    Transfers                    Ambulation/Gait                Stairs            Wheelchair Mobility    Modified Rankin (Stroke Patients Only)       Balance Overall balance assessment: Needs assistance Sitting-balance support: Feet supported;No upper extremity supported Sitting balance-Leahy Scale: Poor Sitting balance - Comments: fair/poor with BUE support Postural control: Other (comment) (falls forward)                                   Pertinent Vitals/Pain Pain Assessment: No/denies pain    Home Living Family/patient expects to be discharged to:: Private residence Living Arrangements: Spouse/significant other Available Help at Discharge: Friend(s);Available 24 hours/day Type of Home: House Home Access: Ramped entrance     Home Layout: One level Home Equipment: Walker - 2 wheels;Wheelchair - manual;Shower seat;Bedside commode;Hospital bed;Walker - 4 wheels Additional Comments: all info from previous admission - patient is a poor historian    Prior Function Level of Independence: Needs assistance   Gait / Transfers Assistance Needed: household amblator with assistance  ADL's / Homemaking Assistance Needed: assisted by family        Hand Dominance   Dominant Hand: Right    Extremity/Trunk Assessment   Upper Extremity Assessment Upper  Extremity Assessment: Generalized weakness    Lower Extremity Assessment Lower Extremity Assessment: Generalized weakness    Cervical / Trunk Assessment Cervical / Trunk Assessment: Normal  Communication   Communication: Other (comment) (Patient mostly non-verbal)  Cognition Arousal/Alertness: Awake/alert Behavior During Therapy: Flat affect Overall Cognitive Status: No family/caregiver present to determine baseline cognitive functioning                                        General Comments       Exercises     Assessment/Plan    PT Assessment Patient needs continued PT services  PT Problem List Decreased strength;Decreased activity tolerance;Decreased balance;Decreased mobility       PT Treatment Interventions Balance training;Gait training;Stair training;Functional mobility training;Therapeutic activities;Therapeutic exercise;Patient/family education    PT Goals (Current goals can be found in the Care Plan section)  Acute Rehab PT Goals Patient Stated Goal: not stated PT Goal Formulation: With patient Time For Goal Achievement: 03/05/20 Potential to Achieve Goals: Fair    Frequency Min 3X/week   Barriers to discharge        Co-evaluation               AM-PAC PT "6 Clicks" Mobility  Outcome Measure Help needed turning from your back to your side while in a flat bed without using bedrails?: A Lot Help needed moving from lying on your back to sitting on the side of a flat bed without using bedrails?: A Lot Help needed moving to and from a bed to a chair (including a wheelchair)?: Total Help needed standing up from a chair using your arms (e.g., wheelchair or bedside chair)?: Total Help needed to walk in hospital room?: Total Help needed climbing 3-5 steps with a railing? : Total 6 Click Score: 8    End of Session Equipment Utilized During Treatment: Oxygen Activity Tolerance: Patient tolerated treatment well;Patient limited by fatigue Patient left: in bed;with call bell/phone within reach;with bed alarm set Nurse Communication: Mobility status PT Visit Diagnosis: Unsteadiness on feet (R26.81);Other abnormalities of gait and mobility (R26.89);Muscle weakness (generalized) (M62.81)    Time: 2060-1561 PT Time Calculation (min) (ACUTE ONLY): 23 min   Charges:   PT Evaluation $PT Eval Moderate Complexity: 1 Mod PT Treatments $Therapeutic Activity: 23-37 mins        12:20 PM, 02/20/20 Antonio Ramirez, MPT Physical Therapist with [facility] [phone] office [phone] mobile phone

## 2020-02-20 NOTE — Progress Notes (Signed)
Pt heart rate went up to 160's. Hospitalist aware. Order received.

## 2020-02-20 NOTE — Progress Notes (Signed)
2:18am RN called due to pt's HR in 190s for 1 minute and quickly returned to normal. pt was not in any distress She called back at 2:35am that the HR went back to 169, but also quickly returned to normal HR. Pt's chart was reviewed and was noted to have tachyarrhythmia. EKG was ordered and showed NSR at a rate of 63 bpm and RBBB At bedside, pt. was on BIPAP. HR was 57- 63bpm, he was alert, follows commands and he was in no acute distress. We shall continue to monitor patient.

## 2020-02-21 DIAGNOSIS — J441 Chronic obstructive pulmonary disease with (acute) exacerbation: Secondary | ICD-10-CM | POA: Diagnosis not present

## 2020-02-21 DIAGNOSIS — I25810 Atherosclerosis of coronary artery bypass graft(s) without angina pectoris: Secondary | ICD-10-CM | POA: Diagnosis not present

## 2020-02-21 DIAGNOSIS — I429 Cardiomyopathy, unspecified: Secondary | ICD-10-CM | POA: Diagnosis not present

## 2020-02-21 DIAGNOSIS — J9601 Acute respiratory failure with hypoxia: Secondary | ICD-10-CM | POA: Diagnosis not present

## 2020-02-21 DIAGNOSIS — I480 Paroxysmal atrial fibrillation: Secondary | ICD-10-CM | POA: Diagnosis not present

## 2020-02-21 LAB — COMPREHENSIVE METABOLIC PANEL
ALT: 50 U/L — ABNORMAL HIGH (ref 0–44)
AST: 82 U/L — ABNORMAL HIGH (ref 15–41)
Albumin: 3.1 g/dL — ABNORMAL LOW (ref 3.5–5.0)
Alkaline Phosphatase: 78 U/L (ref 38–126)
Anion gap: 12 (ref 5–15)
BUN: 14 mg/dL (ref 6–20)
CO2: 41 mmol/L — ABNORMAL HIGH (ref 22–32)
Calcium: 9.7 mg/dL (ref 8.9–10.3)
Chloride: 91 mmol/L — ABNORMAL LOW (ref 98–111)
Creatinine, Ser: 0.53 mg/dL — ABNORMAL LOW (ref 0.61–1.24)
GFR, Estimated: >60 mL/min (ref >60)
Glucose, Bld: 80 mg/dL (ref 70–99)
Potassium: 3.4 mmol/L — ABNORMAL LOW (ref 3.5–5.1)
Sodium: 144 mmol/L (ref 135–145)
Total Bilirubin: 2.4 mg/dL — ABNORMAL HIGH (ref 0.3–1.2)
Total Protein: 6.1 g/dL — ABNORMAL LOW (ref 6.5–8.1)

## 2020-02-21 LAB — CULTURE, BLOOD (ROUTINE X 2)
Culture: NO GROWTH
Culture: NO GROWTH
Special Requests: ADEQUATE

## 2020-02-21 LAB — CBC
HCT: 53.7 % — ABNORMAL HIGH (ref 39.0–52.0)
Hemoglobin: 16.8 g/dL (ref 13.0–17.0)
MCH: 27.5 pg (ref 26.0–34.0)
MCHC: 31.3 g/dL (ref 30.0–36.0)
MCV: 87.7 fL (ref 80.0–100.0)
Platelets: 192 10*3/uL (ref 150–400)
RBC: 6.12 MIL/uL — ABNORMAL HIGH (ref 4.22–5.81)
RDW: 19.5 % — ABNORMAL HIGH (ref 11.5–15.5)
WBC: 13.5 10*3/uL — ABNORMAL HIGH (ref 4.0–10.5)
nRBC: 0 % (ref 0.0–0.2)

## 2020-02-21 LAB — GLUCOSE, CAPILLARY
Glucose-Capillary: 99 mg/dL (ref 70–99)
Glucose-Capillary: 101 mg/dL — ABNORMAL HIGH (ref 70–99)
Glucose-Capillary: 105 mg/dL — ABNORMAL HIGH (ref 70–99)
Glucose-Capillary: 128 mg/dL — ABNORMAL HIGH (ref 70–99)

## 2020-02-21 MED ORDER — AMIODARONE IV BOLUS ONLY 150 MG/100ML
150.0000 mg | Freq: Once | INTRAVENOUS | Status: DC
Start: 2020-02-21 — End: 2020-02-21

## 2020-02-21 MED ORDER — POTASSIUM CHLORIDE 20 MEQ PO PACK
40.0000 meq | PACK | Freq: Once | ORAL | Status: AC
  Administered 2020-02-21: 40 meq via ORAL
  Filled 2020-02-21: qty 2

## 2020-02-21 MED ORDER — IPRATROPIUM-ALBUTEROL 0.5-2.5 (3) MG/3ML IN SOLN
3.0000 mL | Freq: Four times a day (QID) | RESPIRATORY_TRACT | Status: DC
  Administered 2020-02-21 – 2020-02-24 (×10): 3 mL via RESPIRATORY_TRACT
  Filled 2020-02-21 (×11): qty 3

## 2020-02-21 MED ORDER — ENSURE ENLIVE PO LIQD
237.0000 mL | Freq: Three times a day (TID) | ORAL | Status: DC
  Administered 2020-02-21: 237 mL via ORAL
  Filled 2020-02-21: qty 237

## 2020-02-21 MED ORDER — PREDNISONE 20 MG PO TABS
50.0000 mg | ORAL_TABLET | Freq: Every day | ORAL | Status: AC
  Administered 2020-02-21: 50 mg via ORAL
  Filled 2020-02-21: qty 1

## 2020-02-21 MED ORDER — ALBUTEROL SULFATE (2.5 MG/3ML) 0.083% IN NEBU
2.5000 mg | INHALATION_SOLUTION | RESPIRATORY_TRACT | Status: DC | PRN
  Administered 2020-02-24 – 2020-02-25 (×2): 2.5 mg via RESPIRATORY_TRACT
  Filled 2020-02-21 (×2): qty 3

## 2020-02-21 MED ORDER — ISOSORBIDE MONONITRATE ER 30 MG PO TB24
30.0000 mg | ORAL_TABLET | Freq: Every day | ORAL | Status: DC
  Filled 2020-02-21: qty 1

## 2020-02-21 MED ORDER — SENNOSIDES-DOCUSATE SODIUM 8.6-50 MG PO TABS
2.0000 | ORAL_TABLET | Freq: Two times a day (BID) | ORAL | Status: DC
  Filled 2020-02-21 (×3): qty 2

## 2020-02-21 MED ORDER — LOSARTAN POTASSIUM 25 MG PO TABS
25.0000 mg | ORAL_TABLET | Freq: Every day | ORAL | Status: DC
  Administered 2020-02-21: 25 mg via ORAL
  Filled 2020-02-21 (×2): qty 1

## 2020-02-21 MED ORDER — ADULT MULTIVITAMIN W/MINERALS CH
1.0000 | ORAL_TABLET | Freq: Every day | ORAL | Status: DC
  Administered 2020-02-21: 1 via ORAL
  Filled 2020-02-21 (×2): qty 1

## 2020-02-21 NOTE — Progress Notes (Addendum)
Progress Note  Patient Name: Antonio Ramirez Date of Encounter: 02/21/2020  Primary Cardiologist: Carlyle Dolly, MD  Subjective   Patient off BiPAP this morning, somewhat more interactive.  Still confused as to situation and time.  Inpatient Medications    Scheduled Meds: . aspirin  81 mg Per Tube Daily  . chlorhexidine gluconate (MEDLINE KIT)  15 mL Mouth Rinse BID  . Chlorhexidine Gluconate Cloth  6 each Topical Daily  . clopidogrel  75 mg Oral Daily  . docusate  100 mg Oral BID  . enalaprilat  1.25 mg Intravenous Q6H  . enoxaparin (LOVENOX) injection  40 mg Subcutaneous Q24H  . influenza vac split quadrivalent PF  0.5 mL Intramuscular Tomorrow-1000  . insulin aspart  0-5 Units Subcutaneous QHS  . insulin aspart  0-6 Units Subcutaneous TID WC  . ipratropium-albuterol  3 mL Nebulization QID  . mouth rinse  15 mL Mouth Rinse BID  . metoprolol tartrate  50 mg Oral BID  . pneumococcal 23 valent vaccine  0.5 mL Intramuscular Tomorrow-1000   Continuous Infusions: . dextrose 5 % and 0.45% NaCl 50 mL/hr at 02/21/20 0800  . famotidine (PEPCID) IV Stopped (02/20/20 2326)   PRN Meds: acetaminophen **OR** acetaminophen, albuterol, diphenhydrAMINE, LORazepam, ondansetron **OR** ondansetron (ZOFRAN) IV, oxyCODONE   Vital Signs    Vitals:   02/21/20 0500 02/21/20 0600 02/21/20 0700 02/21/20 0800  BP: (!) 156/127 (!) 159/86 (!) 143/63 (!) 170/65  Pulse: (!) 55 64 (!) 48 (!) 49  Resp: 15   20  Temp:      TempSrc:      SpO2: 92% 92% 99% 99%  Weight:      Height:        Intake/Output Summary (Last 24 hours) at 02/21/2020 0838 Last data filed at 02/21/2020 0800 Gross per 24 hour  Intake 636.99 ml  Output 2950 ml  Net -2313.01 ml   Filed Weights   02/16/20 0343 02/17/20 0630 02/19/20 0500  Weight: 86.3 kg 86.2 kg 82.5 kg    Telemetry    Sinus rhythm with occasional PACs and PVCs.  Personally reviewed.  ECG    An ECG dated 02/20/2020 was personally reviewed today  and demonstrated:  Sinus rhythm with right bundle branch block.  Physical Exam   GEN:  No distress, off BiPAP.   Neck: No obvious JVD. Cardiac: RRR with ectopy, no gallop.  Respiratory:  Decreased breath sounds, no wheezing. GI: Soft, nontender, bowel sounds present. MS: No edema.  Labs    Chemistry Recent Labs  Lab 02/17/20 0601 02/19/20 0439 02/21/20 0418  NA 139 147* 144  K 4.1 3.8 3.4*  CL 104 105 91*  CO2 27 33* 41*  GLUCOSE 104* 107* 80  BUN 38* 29* 14  CREATININE 0.96 0.76 0.53*  CALCIUM 9.3 9.9 9.7  PROT 5.4* 6.2* 6.1*  ALBUMIN 2.7* 3.2* 3.1*  AST 841* 426* 82*  ALT 559* 175* 50*  ALKPHOS 80 89 78  BILITOT 1.0 1.5* 2.4*  GFRNONAA >60 >60 >60  ANIONGAP _0 Hematology Recent Labs  Lab 02/17/20 0601 02/19/20 0439 02/21/20 0418  WBC 17.7* 13.4* 13.5*  RBC 5.88* 6.07* 6.12*  HGB 16.7 16.7 16.8  HCT 51.2 56.1* 53.7*  MCV 87.1 92.4 87.7  MCH 28.4 27.5 27.5  MCHC 32.6 29.8* 31.3  RDW 19.6* 20.4* 19.5*  PLT 240 167 192    BNP Recent Labs  Lab 02/18/20 0358  BNP 867.0*  Radiology    DG Foot 2 Views Left  Result Date: 02/20/2020 CLINICAL DATA:  Left foot pain EXAM: LEFT FOOT - 2 VIEW COMPARISON:  None. FINDINGS: Mild diffuse soft tissue swelling the foot more focal swelling towards the level of the metatarsal heads. The osseous structures appear diffusely demineralized which may limit detection of small or nondisplaced fractures. No soft tissue gas or foreign body. Questionable lucency through the head of the first metatarsal on lateral radiograph, could reflect a nondisplaced fracture. No other acute osseous abnormality is evident. Mild degenerative changes throughout the foot. 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 IMPRESSION: 1. Mild diffuse soft tissue swelling of the foot, more focal swelling towards the level of the metatarsal heads. 2. Questionable lucency through the head of the first metatarsal on lateral radiograph,  could reflect a nondisplaced fracture. Correlate for point tenderness. Electronically Signed   By: Lovena Le M.D.   On: 02/20/2020 19:14    Cardiac Studies   Echocardiogram 02/16/2020: 1. Left ventricular ejection fraction, by estimation, is 45 to 50%. The  left ventricle has mildly decreased function. The left ventricle has no  regional wall motion abnormalities. There is mild left ventricular  hypertrophy. Left ventricular diastolic  parameters are indeterminate.  2. Ventricular septum is flattened in systole consistent with RV pressure  overload. . Right ventricular systolic function is severely reduced. The  right ventricular size is severely enlarged.  3. Right atrial size was moderately dilated.  4. The mitral valve is normal in structure. No evidence of mitral valve  regurgitation. No evidence of mitral stenosis.  5. AV and LVOT Dopplers are off axis and likely underestimated. Visually  there is no significant stenosis or regurgitation. . The aortic valve is  tricuspid. Aortic valve regurgitation is not visualized. No aortic  stenosis is present.   Patient Profile     59 y.o. male with a history of CAD status post CABG in 2006, COPD, hyperlipidemia, tobacco and substance use, now admitted with acute hypoxic/hypercarbic respiratory failure with sepsis.  Assessment & Plan    1.  Recurrent episodes of SVT, likely rapid atrial fibrillation versus atypical atrial flutter per recent review of telemetry.  CHA2DS2-VASc score is 2.  He was switched back to IV medications yesterday given difficulty with oral intake, more interactive today however.  Currently in sinus rhythm.  2.  Secondary cardiomyopathy with known underlying ischemic heart disease and also acute illness.  LVEF 45 to 50%.  He has severe RV dysfunction more likely related to his pulmonary disease.  2.  Acute hypoxic/hypercarbic respiratory failure with COPD and opiate abuse.  Initially requiring mechanical  ventilation, more recently on BiPAP.  Post extubation chest x-ray without acute infiltrate.  3.  Agitation and altered mental status in the setting of problem #2.  Head CT from November 6 shows chronic right MCA infarct, no obvious acute findings.  Ammonia level 26.  4.  Transaminitis.  Holding Lipitor temporarily.  AST down to 82 and ALT down to 50.  5.  Staph epidermidis bacteremia afebrile.  Completed course of Ancef.  Discussed case with Dr. Denton Brick.  We will try to resume oral medications today.  Continue aspirin and Plavix, resume Imdur 30 mg daily which he was taking as an outpatient.  DC enalaprilat and start losartan 25 mg daily.  DC divided dose IV Lopressor and start oral Lopressor 50 mg twice daily and titrate as necessary.  Not requiring diuretic at this time.  Anticoagulation is  not being pursued at this point, would continue to avoid amiodarone if possible.  Signed, Rozann Lesches, MD  02/21/2020, 8:38 AM

## 2020-02-21 NOTE — NC FL2 (Signed)
[place] LEVEL OF CARE SCREENING TOOL     IDENTIFICATION  Patient Name: Antonio Ramirez Birthdate: Oct 18, 1960 Sex: male Admission Date (Current Location): 02/14/2020  [facility] and [place] Number:  [employer] and Address:  [place] [address] [place]      Provider Number: [phone] [id number]  Attending Physician Name and Address:  Roxan Hockey, MD  Relative Name and Phone Number:       Current Level of Care: Hospital Recommended Level of Care: [place] Prior Approval Number:    Date Approved/Denied:   PASRR Number: [id number]  Discharge Plan: SNF    Current Diagnoses: Patient Active Problem List   Diagnosis Date Noted  . Altered mental status   . Endotracheally intubated 02/15/2020  . Acute respiratory failure ([place]) 02/14/2020  . Pressure injury of skin 06/30/2019  . Sepsis ([place]) 06/29/2019  . Acute respiratory failure with hypoxia ([place]) 06/01/2019  . COPD (chronic obstructive pulmonary disease) ([place])   . COPD with acute exacerbation ([place])   . Current every day smoker   . Opioid dependence ([place])   . Depression   . Hyperlipidemia   . CAD (coronary artery disease)   . Elevated troponin   . AKI (acute kidney injury) ([place])   . Hyperglycemia   . Tobacco abuse   . Demand ischemia of myocardium ([place])   . Hyperlipidemia LDL goal <70     Orientation RESPIRATION BLADDER Height & Weight     Self, Place  O2 (see dc summary) Incontinent Weight: 181 lb 14.1 oz (82.5 kg) Height:  '6\' 1"'  (185.4 cm)  BEHAVIORAL SYMPTOMS/MOOD NEUROLOGICAL BOWEL NUTRITION STATUS      Continent Diet (see dc summary)  AMBULATORY STATUS COMMUNICATION OF NEEDS Skin   Extensive Assist Verbally Skin abrasions (skin tear chest)                       Personal Care Assistance Level of Assistance  Bathing, Feeding, Dressing Bathing Assistance: Limited assistance Feeding assistance: Independent Dressing Assistance:  Limited assistance     Functional Limitations Info  Sight, Hearing, Speech Sight Info: Adequate Hearing Info: Adequate Speech Info: Adequate    SPECIAL CARE FACTORS FREQUENCY  PT (By licensed PT), OT (By licensed OT)     PT Frequency: 5x week OT Frequency: 3x week            Contractures Contractures Info: Not present    Additional Factors Info  Code Status, Allergies Code Status Info: Full Allergies Info: Opana           Current Medications (02/21/2020):  This is the current hospital active medication list Current Facility-Administered Medications  Medication Dose Route Frequency Provider Last Rate Last Admin  . acetaminophen (TYLENOL) tablet 650 mg  650 mg Oral Q6H PRN Emokpae, Ejiroghene E, MD       Or  . acetaminophen (TYLENOL) suppository 650 mg  650 mg Rectal Q6H PRN Emokpae, Ejiroghene E, MD      . albuterol (PROVENTIL) (2.5 MG/3ML) 0.083% nebulizer solution 2.5 mg  2.5 mg Nebulization Q2H PRN Emokpae, Courage, MD      . aspirin chewable tablet 81 mg  81 mg Per Tube Daily Emokpae, Courage, MD   81 mg at 02/21/20 0905  . chlorhexidine gluconate (MEDLINE KIT) (PERIDEX) 0.12 % solution 15 mL  15 mL Mouth Rinse BID Emokpae, Ejiroghene E, MD   15 mL at 02/20/20 1948  . Chlorhexidine Gluconate Cloth  2 % PADS 6 each  6 each Topical Daily Anders Simmonds, MD   6 each at 02/21/20 0600  . clopidogrel (PLAVIX) tablet 75 mg  75 mg Oral Daily Emokpae, Courage, MD   75 mg at 02/21/20 0905  . dextrose 5 %-0.45 % sodium chloride infusion   Intravenous Continuous Roxan Hockey, MD   Stopped at 02/21/20 0909  . diphenhydrAMINE (BENADRYL) injection 25 mg  25 mg Intravenous Q6H PRN Emokpae, Courage, MD      . docusate (COLACE) 50 MG/5ML liquid 100 mg  100 mg Oral BID Emokpae, Ejiroghene E, MD   100 mg at 02/21/20 0910  . enoxaparin (LOVENOX) injection 40 mg  40 mg Subcutaneous Q24H Emokpae, Courage, MD   40 mg at 02/20/20 2256  . famotidine (PEPCID) IVPB 20 mg premix  20 mg  Intravenous Q12H Emokpae, Ejiroghene E, MD 100 mL/hr at 02/21/20 0909 20 mg at 02/21/20 0909  . influenza vac split quadrivalent PF (FLUARIX) injection 0.5 mL  0.5 mL Intramuscular Tomorrow-1000 Emokpae, Ejiroghene E, MD      . insulin aspart (novoLOG) injection 0-5 Units  0-5 Units Subcutaneous QHS Emokpae, Courage, MD      . insulin aspart (novoLOG) injection 0-6 Units  0-6 Units Subcutaneous TID WC Emokpae, Courage, MD      . ipratropium-albuterol (DUONEB) 0.5-2.5 (3) MG/3ML nebulizer solution 3 mL  3 mL Nebulization QID Denton Brick, Courage, MD   3 mL at 02/21/20 0907  . LORazepam (ATIVAN) injection 0.5 mg  0.5 mg Intravenous Q8H PRN Denton Brick, Courage, MD   0.5 mg at 02/19/20 2230  . losartan (COZAAR) tablet 25 mg  25 mg Oral Daily Satira Sark, MD   25 mg at 02/21/20 0906  . MEDLINE mouth rinse  15 mL Mouth Rinse BID Denton Brick, Courage, MD   15 mL at 02/20/20 2258  . metoprolol tartrate (LOPRESSOR) tablet 50 mg  50 mg Oral BID Roxan Hockey, MD   50 mg at 02/21/20 0905  . ondansetron (ZOFRAN) tablet 4 mg  4 mg Oral Q6H PRN Emokpae, Ejiroghene E, MD       Or  . ondansetron (ZOFRAN) injection 4 mg  4 mg Intravenous Q6H PRN Emokpae, Ejiroghene E, MD      . oxyCODONE (Oxy IR/ROXICODONE) immediate release tablet 10 mg  10 mg Oral Q6H PRN Emokpae, Courage, MD      . pneumococcal 23 valent vaccine (PNEUMOVAX-23) injection 0.5 mL  0.5 mL Intramuscular Tomorrow-1000 Emokpae, Ejiroghene E, MD         Discharge Medications: Please see discharge summary for a list of discharge medications.  Relevant Imaging Results:  Relevant Lab Results:   Additional Information SSN: [place]  Shade Flood, LCSW

## 2020-02-21 NOTE — Progress Notes (Signed)
Patient Demographics:    Antonio Ramirez, is a 59 y.o. male, DOB - 1960-06-16, EXH:[id number]  Admit date - 02/14/2020   Admitting Physician Ejiroghene Arlyce Dice, MD  Outpatient Primary MD for the patient is [name], DO  LOS - 7   Chief Complaint  Patient presents with  . Altered Mental Status        Subjective:    Antonio Ramirez  - 02/21/20 -More awake after using BiPAP overnight -Patient's ex-wife/HCPOA at bedside -Oral intake is better -No fevers, no vomiting  Assessment  & Plan :    Active Problems:   Acute respiratory failure with hypoxia (HCC)   Endotracheally intubated   Opioid dependence ([place])   COPD with acute exacerbation (HCC)   Depression   CAD (coronary artery disease)   Altered mental status  Brief Summary:- a58 y.o.malewith a history of COPD, ongoing tobacco use, CAD s/p CABG, carotid artery disease,  chronic back pain with opioid dependence, and opioid overdose as well as chronic depression admitted on 02/14/2020 with acute hypoxic respiratory failure requiring intubation and mechanical ventilation- -Patient had a similar presentation in March 2021 when he was intubated for altered mentation subsequently did okay was extubated -Extubated 02/17/2020 -BiPAP initiated on 02/19/2020, now on nightly BiPAP  A/p 1)Acute hypoxic and hypercapnic respiratory failure--- initially thought to be from a combination of opiate abuse and COPD- pulmonary consult from Dr. Melvyn Novas appreciated --ABG improved--specifically respiratory acidosis has resolved -VQ scan negative for PE -Lower extremity venous Dopplers without acute DVT --Chest x-ray on 02/15/2020 and 02/16/20 with possible pulm venous congestion-treated with Lasix -Patient was successfully extubated on 02/17/20  -02/21/20-- -less sleepy with BiPAP use -BiPAP initiated on 02/19/2020 for uncompensated respiratory acidosis  secondary to hypercapnia--- currently requiring nightly BiPAP  2)SIRS-- meets SIRS criteria, sepsis Not Confirmed at this time as source of infection yet to be identified - blood cultures from 02/13/2020 with staph epi-contaminant  -Treated with IV Ancef, repeat blood cultures from 02/14/2020 and 02/16/2020--- NGTD -Stopped Ancef on 02/19/2020  3)Staph Epi bacteremia ---- please see #2 above --Episodes of hemodynamic instability persist with erratic blood pressures --Transthoracic echocardiogram without evidence for endocarditis, --WBC 19.4>>18.1>>17.7>.13.4--patient is on steroids -Fevers resolved Stopped Ancef on 02/19/2020  4) acute COPD exacerbation--- Pulm consult from Dr Melvyn Novas -Appreciated, treated with steroids and bronchodilators  5)CAD---s/p CABG 2006 and PVD--- -c/n aspirin and Plavix -Echo with EF of 40 to 45% which is down from 55 to 60% back in February 2021  - 6)AKI----acute kidney injury on CKD stage 3B --   creatinine is has normalized (peak was 1.85)  ,  baseline creatinine = 1.5   renally adjust medications, avoid nephrotoxic agents / dehydration  / hypotension -AKI resolved with hydration  7)Social/Ethics--plan of care and advanced directive discussed with patient ex-wife who is also his POA--patient is a full code with full scope of treatment  8)FEN/hypernatremia.--Sodium improved with IV fluids -Suspect free water deficit due to poor oral intake due to lethargy post intubation -Oral intake appears to be improving - check CBGs and adjust sliding scale  9)HFrEF--Tachyarrhythmia--- patient alternating between SVT and A. fib with RVR--Cardiology consult appreciated, need to replace potassium, echo with reduced EF (45 to 50%) -Continue aspirin and Plavix --Cardiology recommends  against anticoagulation at this time given low A. fib burden and patient's history of substance abuse and poor compliance -Elevated BNP noted, Imdur 30 mg daily, losartan 25 mg daily, metoprolol  50 mg twice daily,  10) generalized weakness and deconditioning--PT eval appreciated recommends SNF rehab  Disposition/Need for in-Hospital Stay- patient unable to be discharged at this time due to - requiring BiPAP-for uncompensated respiratory acidosis secondary to hypercapnia  Status is: Inpatient  Remains inpatient appropriate because:remains hypoxic and lethargic requiring BiPAP-for uncompensated respiratory acidosis secondary to hypercapnia   Disposition: The patient is from: Home              Anticipated d/c is to: SNF in 1 to 2 days              Anticipated d/c date is: 1 day              Patient currently is not medically stable to d/c. Barriers: Not Clinically Stable- remains hypoxic and lethargic requiring BiPAP-for uncompensated respiratory acidosis secondary to hypercapnia  Code Status : full  Procedures:- Intubated 02/14/2020  -Extubated 02/17/2020 Bipap-- 02/19/20  Family Communication:   Discussed with patient's ex- wife/POA at bedside on 02/21/2020  Consults  :  PCCM/cardiology  DVT Prophylaxis  :  Lovenox -  - SCDs    Lab Results  Component Value Date   PLT 192 02/21/2020    Inpatient Medications  Scheduled Meds: . aspirin  81 mg Per Tube Daily  . chlorhexidine gluconate (MEDLINE KIT)  15 mL Mouth Rinse BID  . Chlorhexidine Gluconate Cloth  6 each Topical Daily  . clopidogrel  75 mg Oral Daily  . enoxaparin (LOVENOX) injection  40 mg Subcutaneous Q24H  . feeding supplement  237 mL Oral TID BM  . influenza vac split quadrivalent PF  0.5 mL Intramuscular Tomorrow-1000  . insulin aspart  0-5 Units Subcutaneous QHS  . insulin aspart  0-6 Units Subcutaneous TID WC  . ipratropium-albuterol  3 mL Nebulization Q6H  . [START ON 02/22/2020] isosorbide mononitrate  30 mg Oral Daily  . losartan  25 mg Oral Daily  . mouth rinse  15 mL Mouth Rinse BID  . metoprolol tartrate  50 mg Oral BID  . multivitamin with minerals  1 tablet Oral Daily  . pneumococcal 23  valent vaccine  0.5 mL Intramuscular Tomorrow-1000  . potassium chloride  40 mEq Oral Once  . senna-docusate  2 tablet Oral BID   Continuous Infusions: . dextrose 5 % and 0.45% NaCl 50 mL/hr at 02/21/20 1600  . famotidine (PEPCID) IV Stopped (02/21/20 0939)   PRN Meds:.acetaminophen **OR** acetaminophen, albuterol, diphenhydrAMINE, LORazepam, ondansetron **OR** ondansetron (ZOFRAN) IV, oxyCODONE    Anti-infectives (From admission, onward)   Start     Dose/Rate Route Frequency Ordered Stop   02/15/20 2200  vancomycin (VANCOCIN) IVPB 1000 mg/200 mL premix  Status:  Discontinued        1,000 mg 200 mL/hr over 60 Minutes Intravenous Every 24 hours 02/14/20 2149 02/15/20 1044   02/15/20 1130  ceFAZolin (ANCEF) IVPB 2g/100 mL premix        2 g 200 mL/hr over 30 Minutes Intravenous Every 8 hours 02/15/20 1044 02/20/20 0133   02/14/20 2200  metroNIDAZOLE (FLAGYL) IVPB 500 mg  Status:  Discontinued        500 mg 100 mL/hr over 60 Minutes Intravenous Every 8 hours 02/14/20 2136 02/15/20 1433   02/14/20 2200  vancomycin (VANCOREADY) IVPB 1250 mg/250 mL  1,250 mg 166.7 mL/hr over 90 Minutes Intravenous  Once 02/14/20 2148 02/15/20 0128   02/14/20 2200  ceFEPIme (MAXIPIME) 2 g in sodium chloride 0.9 % 100 mL IVPB  Status:  Discontinued        2 g 200 mL/hr over 30 Minutes Intravenous Every 12 hours 02/14/20 2148 02/15/20 1044       Objective:   Vitals:   02/21/20 1400 02/21/20 1432 02/21/20 1503 02/21/20 1600  BP: (!) 97/48  (!) 123/51 (!) 134/53  Pulse: (!) 25  65 65  Resp: (!) 23  19 (!) 21  Temp:    98.6 F (37 C)  TempSrc:    Oral  SpO2: (!) 80% 98% 100% 93%  Weight:      Height:        Wt Readings from Last 3 Encounters:  02/19/20 82.5 kg  02/13/20 70.3 kg  12/16/19 80.3 kg    Intake/Output Summary (Last 24 hours) at 02/21/2020 1624 Last data filed at 02/21/2020 1600 Gross per 24 hour  Intake 1011.92 ml  Output 202 ml  Net 809.92 ml   Physical Exam Gen:-Less  sleepy, trying to talk more   HEENT:-Normocephalic atraumatic, no scleral icterus  Neck- Right IJ central line  Mouth/Nose-  nasal cannula Lungs--somewhat diminished, no wheezing  CV- S1, S2 normal, regular , CABG scar Abd-  +ve B.Sounds, Abd Soft, not distended,    Extremity/Skin:- No  edema, pedal pulses present  Neuro-Psych-more awake, more alert, talking more, generalized weakness and ambulatory dysfunction persist   Data Review:   Micro Results Recent Results (from the past 240 hour(s))  Respiratory Panel by RT PCR (Flu A&B, Covid) - Nasopharyngeal Swab     Status: None   Collection Time: 02/13/20  4:51 PM   Specimen: Nasopharyngeal Swab  Result Value Ref Range Status   SARS Coronavirus 2 by RT PCR NEGATIVE NEGATIVE Final    Comment: (NOTE) SARS-CoV-2 target nucleic acids are NOT DETECTED.  The SARS-CoV-2 RNA is generally detectable in upper respiratoy specimens during the acute phase of infection. The lowest concentration of SARS-CoV-2 viral copies this assay can detect is 131 copies/mL. A negative result does not preclude SARS-Cov-2 infection and should not be used as the sole basis for treatment or other patient management decisions. A negative result may occur with  improper specimen collection/handling, submission of specimen other than nasopharyngeal swab, presence of viral mutation(s) within the areas targeted by this assay, and inadequate number of viral copies (<131 copies/mL). A negative result must be combined with clinical observations, patient history, and epidemiological information. The expected result is Negative.  Fact Sheet for Patients:  PinkCheek.be  Fact Sheet for Healthcare Providers:  GravelBags.it  This test is no t yet approved or cleared by the [place] FDA and  has been authorized for detection and/or diagnosis of SARS-CoV-2 by FDA under an Emergency Use Authorization (EUA). This  EUA will remain  in effect (meaning this test can be used) for the duration of the COVID-19 declaration under Section 564(b)(1) of the Act, 21 U.S.C. section 360bbb-3(b)(1), unless the authorization is terminated or revoked sooner.     Influenza A by PCR NEGATIVE NEGATIVE Final   Influenza B by PCR NEGATIVE NEGATIVE Final    Comment: (NOTE) The Xpert Xpress SARS-CoV-2/FLU/RSV assay is intended as an aid in  the diagnosis of influenza from Nasopharyngeal swab specimens and  should not be used as a sole basis for treatment. Nasal washings and  aspirates are unacceptable for  Xpert Xpress SARS-CoV-2/FLU/RSV  testing.  Fact Sheet for Patients: PinkCheek.be  Fact Sheet for Healthcare Providers: GravelBags.it  This test is not yet approved or cleared by the [place] FDA and  has been authorized for detection and/or diagnosis of SARS-CoV-2 by  FDA under an Emergency Use Authorization (EUA). This EUA will remain  in effect (meaning this test can be used) for the duration of the  Covid-19 declaration under Section 564(b)(1) of the Act, 21  U.S.C. section 360bbb-3(b)(1), unless the authorization is  terminated or revoked. Performed at [facility], [address] [place], [place] [address]   Culture, blood (Routine x 2)     Status: Abnormal   Collection Time: 02/13/20  4:52 PM   Specimen: Right Antecubital; Blood  Result Value Ref Range Status   Specimen Description   Final    RIGHT ANTECUBITAL Performed at [facility], [address], [place], [place] [address]    Special Requests   Final    BOTTLES DRAWN AEROBIC AND ANAEROBIC Blood Culture adequate volume Performed at [facility], [address] [place], [place] [address]    Culture  Setup Time   Final    GRAM POSITIVE COCCI AEROBIC BOTTLE ONLY CRITICAL RESULT CALLED TO, READ BACK BY AND VERIFIED WITH: G. Coffee PharmD 9:35 02/15/20 ([email or web address])    Culture (A)  Final     STAPHYLOCOCCUS EPIDERMIDIS THE SIGNIFICANCE OF ISOLATING THIS ORGANISM FROM A SINGLE SET OF BLOOD CULTURES WHEN MULTIPLE SETS ARE DRAWN IS UNCERTAIN. PLEASE NOTIFY THE MICROBIOLOGY DEPARTMENT WITHIN ONE WEEK IF SPECIATION AND SENSITIVITIES ARE REQUIRED. Performed at [facility], [place] [address] [place], [place] [address]    Report Status 02/16/2020 FINAL  Final  Blood Culture ID Panel (Reflexed)     Status: Abnormal   Collection Time: 02/13/20  4:52 PM  Result Value Ref Range Status   Enterococcus faecalis NOT DETECTED NOT DETECTED Final   Enterococcus Faecium NOT DETECTED NOT DETECTED Final   Listeria monocytogenes NOT DETECTED NOT DETECTED Final   Staphylococcus species DETECTED (A) NOT DETECTED Final    Comment: CRITICAL RESULT CALLED TO, READ BACK BY AND VERIFIED WITH: G. Coffee PharmD 9:35 02/15/20 ([email or web address])    Staphylococcus aureus (BCID) NOT DETECTED NOT DETECTED Final   Staphylococcus epidermidis DETECTED (A) NOT DETECTED Final    Comment: CRITICAL RESULT CALLED TO, READ BACK BY AND VERIFIED WITH: G. Coffee PharmD 9:35 02/15/20 (wilsonm)    Staphylococcus lugdunensis NOT DETECTED NOT DETECTED Final   Streptococcus species NOT DETECTED NOT DETECTED Final   Streptococcus agalactiae NOT DETECTED NOT DETECTED Final   Streptococcus pneumoniae NOT DETECTED NOT DETECTED Final   Streptococcus pyogenes NOT DETECTED NOT DETECTED Final   A.calcoaceticus-baumannii NOT DETECTED NOT DETECTED Final   Bacteroides fragilis NOT DETECTED NOT DETECTED Final   Enterobacterales NOT DETECTED NOT DETECTED Final   Enterobacter cloacae complex NOT DETECTED NOT DETECTED Final   Escherichia coli NOT DETECTED NOT DETECTED Final   Klebsiella aerogenes NOT DETECTED NOT DETECTED Final   Klebsiella oxytoca NOT DETECTED NOT DETECTED Final   Klebsiella pneumoniae NOT DETECTED NOT DETECTED Final   Proteus species NOT DETECTED NOT DETECTED Final   Salmonella species NOT DETECTED NOT DETECTED Final    Serratia marcescens NOT DETECTED NOT DETECTED Final   Haemophilus influenzae NOT DETECTED NOT DETECTED Final   Neisseria meningitidis NOT DETECTED NOT DETECTED Final   Pseudomonas aeruginosa NOT DETECTED NOT DETECTED Final   Stenotrophomonas maltophilia NOT DETECTED NOT DETECTED Final   Candida  albicans NOT DETECTED NOT DETECTED Final   Candida auris NOT DETECTED NOT DETECTED Final   Candida glabrata NOT DETECTED NOT DETECTED Final   Candida krusei NOT DETECTED NOT DETECTED Final   Candida parapsilosis NOT DETECTED NOT DETECTED Final   Candida tropicalis NOT DETECTED NOT DETECTED Final   Cryptococcus neoformans/gattii NOT DETECTED NOT DETECTED Final   Methicillin resistance mecA/C NOT DETECTED NOT DETECTED Final    Comment: Performed at [facility], [place] [address], [place], [place] [address]  Culture, blood (Routine x 2)     Status: None   Collection Time: 02/13/20  5:44 PM   Specimen: Right Antecubital; Blood  Result Value Ref Range Status   Specimen Description RIGHT ANTECUBITAL  Final   Special Requests   Final    BOTTLES DRAWN AEROBIC AND ANAEROBIC Blood Culture adequate volume   Culture   Final    NO GROWTH 6 DAYS Performed at [facility], [address], [place], [place] [address]    Report Status 02/19/2020 FINAL  Final  Culture, Urine     Status: None   Collection Time: 02/14/20  9:26 PM   Specimen: Urine, Clean Catch  Result Value Ref Range Status   Specimen Description   Final    URINE, CLEAN CATCH Performed at [facility], [address], [place], [place] [address]    Special Requests   Final    NONE Performed at [facility], [address] [place], [place] [address]    Culture   Final    NO GROWTH Performed at [facility], [place] [address] [place], [place] [address]    Report Status 02/16/2020 FINAL  Final  Culture, blood (routine x 2)     Status: None   Collection Time: 02/14/20  9:44 PM   Specimen: BLOOD RIGHT HAND  Result Value Ref Range  Status   Specimen Description BLOOD RIGHT HAND  Final   Special Requests   Final    BOTTLES DRAWN AEROBIC ONLY Blood Culture adequate volume   Culture   Final    NO GROWTH 5 DAYS Performed at [facility], [address] [place], [place] [address]    Report Status 02/19/2020 FINAL  Final  Culture, blood (routine x 2)     Status: None   Collection Time: 02/14/20  9:58 PM   Specimen: BLOOD LEFT HAND  Result Value Ref Range Status   Specimen Description BLOOD LEFT HAND  Final   Special Requests   Final    BOTTLES DRAWN AEROBIC ONLY Blood Culture adequate volume   Culture   Final    NO GROWTH 5 DAYS Performed at [facility], [address] [place], [place] [address]    Report Status 02/19/2020 FINAL  Final  MRSA PCR Screening     Status: None   Collection Time: 02/15/20 12:05 AM   Specimen: Nasal Mucosa; Nasopharyngeal  Result Value Ref Range Status   MRSA by PCR NEGATIVE NEGATIVE Final    Comment:        The GeneXpert MRSA Assay (FDA approved for NASAL specimens only), is one component of a comprehensive MRSA colonization surveillance program. It is not intended to diagnose MRSA infection nor to guide or monitor treatment for MRSA infections. Performed at [facility], [address] [place], [place] [address]   Culture, blood (Routine X 2) w Reflex to ID Panel     Status: None   Collection Time: 02/16/20  2:33 PM   Specimen: BLOOD  RIGHT HAND  Result Value Ref Range Status   Specimen Description BLOOD RIGHT HAND  Final   Special Requests   Final    BOTTLES DRAWN AEROBIC AND ANAEROBIC Blood Culture adequate volume   Culture   Final    NO GROWTH 5 DAYS Performed at [facility], [address] [place], [place] [address]    Report Status 02/21/2020 FINAL  Final  Culture, blood (Routine X 2) w Reflex to ID Panel     Status: None   Collection Time: 02/16/20  2:33 PM   Specimen: BLOOD LEFT HAND  Result Value Ref Range Status   Specimen Description BLOOD LEFT HAND  Final    Special Requests   Final    BOTTLES DRAWN AEROBIC AND ANAEROBIC Blood Culture results may not be optimal due to an inadequate volume of blood received in culture bottles   Culture   Final    NO GROWTH 5 DAYS Performed at [facility], [address] [place], [place] [address]    Report Status 02/21/2020 FINAL  Final   Radiology Reports CT HEAD WO CONTRAST  Result Date: 02/18/2020 CLINICAL DATA:  Acute neuro deficit.  Stroke suspected. EXAM: CT HEAD WITHOUT CONTRAST TECHNIQUE: Contiguous axial images were obtained from the base of the skull through the vertex without intravenous contrast. COMPARISON:  02/23/2020.  CT head 06/29/2019 FINDINGS: Brain: Hypodensity in the right temporal and parietal lobe unchanged compatible with chronic infarct. Negative for acute infarct, hemorrhage, mass. Negative for hydrocephalus. Vascular: Negative for hyperdense vessel Skull: Negative Sinuses/Orbits: Paranasal sinuses clear.  Negative orbit Other: None IMPRESSION: Chronic right MCA infarct. No acute abnormality and no change from the prior CT. Electronically Signed   By: Franchot Gallo M.D.   On: 02/18/2020 15:03   CT Head Wo Contrast  Result Date: 02/13/2020 CLINICAL DATA:  Unresponsive EXAM: CT HEAD WITHOUT CONTRAST TECHNIQUE: Contiguous axial images were obtained from the base of the skull through the vertex without intravenous contrast. COMPARISON:  06/29/2019 FINDINGS: Brain: There again noted changes consistent with prior encephalomalacia from ischemic event in the distribution of the right middle cerebral artery involving the right temporal and parietal lobes. The overall appearance is stable. No findings to suggest acute hemorrhage, acute infarction or space-occupying mass lesion are noted. Mild atrophic changes are again seen. Vascular: No hyperdense vessel or unexpected calcification. Skull: Normal. Negative for fracture or focal lesion. Sinuses/Orbits: No acute finding. Other: None. IMPRESSION: Chronic  changes of encephalomalacia in the distribution of the right middle cerebral artery. No acute abnormality noted. Electronically Signed   By: Inez Catalina M.D.   On: 02/13/2020 21:30   NM Pulmonary Perfusion  Result Date: 02/15/2020 CLINICAL DATA:  Respiratory failure EXAM: NUCLEAR MEDICINE PERFUSION LUNG SCAN TECHNIQUE: Perfusion images were obtained in multiple projections after intravenous injection of radiopharmaceutical. Ventilation scans intentionally deferred if perfusion scan and chest x-ray adequate for interpretation during COVID 19 epidemic. RADIOPHARMACEUTICALS:  4.4 mCi Tc-99mMAA IV COMPARISON:  Chest radiograph 02/15/2020 FINDINGS: Inhomogeneous tracer distribution in a nonsegmental pattern in both lungs, pattern favoring parenchymal lung disease. Tiny subsegmental defects at lung bases. Findings are not suspicious for pulmonary embolism. IMPRESSION: No scintigraphic evidence of pulmonary embolism. Electronically Signed   By: MLavonia DanaM.D.   On: 02/15/2020 12:40   [place]Venous Img Lower Unilateral Left (DVT)  Result Date: 02/15/2020 CLINICAL DATA:  Left lower extremity edema. EXAM: LEFT LOWER EXTREMITY VENOUS DOPPLER ULTRASOUND TECHNIQUE: Gray-scale sonography with graded compression, as well as color Doppler  and duplex ultrasound were performed to evaluate the lower extremity deep venous systems from the level of the common femoral vein and including the common femoral, femoral, profunda femoral, popliteal and calf veins including the posterior tibial, peroneal and gastrocnemius veins when visible. The superficial great saphenous vein was also interrogated. Spectral Doppler was utilized to evaluate flow at rest and with distal augmentation maneuvers in the common femoral, femoral and popliteal veins. COMPARISON:  None. FINDINGS: Contralateral Common Femoral Vein: Respiratory phasicity is normal and symmetric with the symptomatic side. No evidence of thrombus. Normal compressibility. Common  Femoral Vein: No evidence of thrombus. Normal compressibility, respiratory phasicity and response to augmentation. Saphenofemoral Junction: No evidence of thrombus. Normal compressibility and flow on color Doppler imaging. Profunda Femoral Vein: No evidence of thrombus. Normal compressibility and flow on color Doppler imaging. Femoral Vein: No evidence of thrombus. Normal compressibility, respiratory phasicity and response to augmentation. Popliteal Vein: No evidence of thrombus. Normal compressibility, respiratory phasicity and response to augmentation. Calf Veins: No evidence of thrombus. Normal compressibility and flow on color Doppler imaging. Superficial Great Saphenous Vein: No evidence of thrombus. Normal compressibility. Venous Reflux:  None. Other Findings: No evidence of superficial thrombophlebitis or abnormal fluid collection. IMPRESSION: No evidence of left lower extremity deep venous thrombosis. Electronically Signed   By: Aletta Edouard M.D.   On: 02/15/2020 10:32   DG Chest Port 1 View  Result Date: 02/18/2020 CLINICAL DATA:  Respiratory failure with hypoxia. EXAM: PORTABLE CHEST 1 VIEW COMPARISON:  02/16/2020 FINDINGS: Endotracheal tube removed.  NG tube removed. Lungs well aerated and clear without infiltrate or effusion. Negative for heart failure. IMPRESSION: No active disease. Electronically Signed   By: Franchot Gallo M.D.   On: 02/18/2020 15:08   DG CHEST PORT 1 VIEW  Result Date: 02/16/2020 CLINICAL DATA:  Recent aspiration, initial encounter EXAM: PORTABLE CHEST 1 VIEW COMPARISON:  02/15/2020 FINDINGS: Cardiac shadow is stable. The lungs are hyperinflated but clear. Gastric catheter is noted within the stomach. Endotracheal tube is seen extending to the level of the aortic arch in satisfactory position. No confluent infiltrate is noted. Mild vascular congestion remains. IMPRESSION: Stable vascular congestion.  No new focal abnormality is noted. Electronically Signed   By: Inez Catalina M.D.   On: 02/16/2020 16:04   Portable Chest xray  Result Date: 02/15/2020 CLINICAL DATA:  Acute respiratory failure. EXAM: PORTABLE CHEST 1 VIEW COMPARISON:  02/14/2020. FINDINGS: The ETT tip is stable above the carina. There is a nasogastric tube with tip and side port below the GE junction. Heart size appears normal. No pleural effusion or airspace consolidation. Pulmonary vascular congestion noted. No overt edema. IMPRESSION: Pulmonary vascular congestion. Electronically Signed   By: Kerby Moors M.D.   On: 02/15/2020 05:41   DG Chest Portable 1 View  Result Date: 02/14/2020 CLINICAL DATA:  Check gastrostomy catheter EXAM: PORTABLE CHEST 1 VIEW COMPARISON:  02/14/2020 FINDINGS: Gastric catheter is been withdrawn and now lies with the tip in the stomach. Proximal side port is noted at the gastroesophageal junction. This could be advanced a few cm deeper into the stomach. IMPRESSION: Gastric catheter as described. Electronically Signed   By: Inez Catalina M.D.   On: 02/14/2020 21:19   DG Chest Portable 1 View  Result Date: 02/14/2020 CLINICAL DATA:  Hypoxia check gastric catheter placement EXAM: PORTABLE CHEST 1 VIEW COMPARISON:  02/14/2020 FINDINGS: Endotracheal tube and gastric catheter are again seen. The gastric catheter has been advanced and is now coiled within the  stomach extending into the proximal duodenum. The lungs are hyperinflated consistent with COPD. No bony abnormality is noted. IMPRESSION: Gastric catheter as described. COPD without acute abnormality. Electronically Signed   By: Inez Catalina M.D.   On: 02/14/2020 21:14   DG Chest Portable 1 View  Result Date: 02/14/2020 CLINICAL DATA:  Intubated, loss of consciousness, COPD EXAM: PORTABLE CHEST 1 VIEW COMPARISON:  02/14/2020 FINDINGS: 2 frontal views of the chest demonstrate endotracheal tube overlying tracheal air column, tip approximately 3.8 cm above carina. Enteric catheter tip and side port project over the distal  thoracic esophagus. Cardiac silhouette is unremarkable. Background emphysema again noted without airspace disease, effusion, or pneumothorax. No acute bony abnormalities. IMPRESSION: 1. Enteric catheter tip and side port projecting over the distal thoracic esophagus. Recommend advancing at least 7 cm to ensure side port placement within the gastric lumen. 2. No complication after intubation. 3. Emphysema. Electronically Signed   By: Randa Ngo M.D.   On: 02/14/2020 18:40   DG Chest Portable 1 View  Result Date: 02/14/2020 CLINICAL DATA:  Shortness of breath, history of COPD EXAM: PORTABLE CHEST 1 VIEW COMPARISON:  02/13/2020 FINDINGS: Emphysema and chronic interstitial changes. No new consolidation or edema. No pleural effusion or pneumothorax. Stable cardiomediastinal contours. IMPRESSION: COPD without acute process in the chest. Electronically Signed   By: Macy Mis M.D.   On: 02/14/2020 17:32   DG Chest Portable 1 View  Result Date: 02/13/2020 CLINICAL DATA:  Hypotension. EXAM: PORTABLE CHEST 1 VIEW COMPARISON:  Prior chest radiographs 12/16/2019 and earlier. FINDINGS: Heart size within normal limits. Prior median sternotomy/CABG. Aortic atherosclerosis. Unchanged nonspecific chronic prominence of the interstitial lung markings. No appreciable airspace consolidation or frank pulmonary edema. No evidence of pleural effusion or pneumothorax. No acute bony abnormality identified. ACDF hardware within the lower cervical spine. IMPRESSION: No evidence of acute cardiopulmonary abnormality. Chronic nonspecific prominence of the interstitial lung markings, unchanged. Prior median sternotomy/CABG. Aortic Atherosclerosis (ICD10-I70.0). Electronically Signed   By: Kellie Simmering DO   On: 02/13/2020 18:15   DG Foot 2 Views Left  Result Date: 02/20/2020 CLINICAL DATA:  Left foot pain EXAM: LEFT FOOT - 2 VIEW COMPARISON:  None. FINDINGS: Mild diffuse soft tissue swelling the foot more focal swelling towards  the level of the metatarsal heads. The osseous structures appear diffusely demineralized which may limit detection of small or nondisplaced fractures. No soft tissue gas or foreign body. Questionable lucency through the head of the first metatarsal on lateral radiograph, could reflect a nondisplaced fracture. No other acute osseous abnormality is evident. Mild degenerative changes throughout the foot. 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 IMPRESSION: 1. Mild diffuse soft tissue swelling of the foot, more focal swelling towards the level of the metatarsal heads. 2. Questionable lucency through the head of the first metatarsal on lateral radiograph, could reflect a nondisplaced fracture. Correlate for point tenderness. Electronically Signed   By: Lovena Le M.D.   On: 02/20/2020 19:14   ECHOCARDIOGRAM COMPLETE  Result Date: 02/16/2020    ECHOCARDIOGRAM REPORT   Patient Name:   EDGEL DEGNAN Date of Exam: 02/16/2020 Medical Rec #:  [id number]         Height:       73.0 in Accession #:    [id number]        Weight:       190.3 lb Date of Birth:  1961/03/10         BSA:  2.106 m Patient Age:    67 years          BP:           93/64 mmHg Patient Gender: M                 HR:           52 bpm. Exam Location:  [facility] Procedure: 2D Echo Indications:    Bacteremia 790.7 / R78.81  History:        Patient has prior history of Echocardiogram examinations, most                 recent 06/01/2019. CAD, COPD; Risk Factors:Current Smoker and                 Dyslipidemia. Opioid dependence.  Sonographer:    Leavy Cella RDCS (AE) Referring Phys: [id number] Leticia Coletta IMPRESSIONS  1. Left ventricular ejection fraction, by estimation, is 45 to 50%. The left ventricle has mildly decreased function. The left ventricle has no regional wall motion abnormalities. There is mild left ventricular hypertrophy. Left ventricular diastolic parameters are indeterminate.  2. Ventricular septum is flattened in  systole consistent with RV pressure overload. . Right ventricular systolic function is severely reduced. The right ventricular size is severely enlarged.  3. Right atrial size was moderately dilated.  4. The mitral valve is normal in structure. No evidence of mitral valve regurgitation. No evidence of mitral stenosis.  5. AV and LVOT Dopplers are off axis and likely underestimated. Visually there is no significant stenosis or regurgitation. . The aortic valve is tricuspid. Aortic valve regurgitation is not visualized. No aortic stenosis is present. FINDINGS  Left Ventricle: Left ventricular ejection fraction, by estimation, is 45 to 50%. The left ventricle has mildly decreased function. The left ventricle has no regional wall motion abnormalities. The left ventricular internal cavity size was normal in size. There is mild left ventricular hypertrophy. Left ventricular diastolic parameters are indeterminate. Right Ventricle: Ventricular septum is flattened in systole consistent with RV pressure overload. The right ventricular size is severely enlarged. Right vetricular wall thickness was not assessed. Right ventricular systolic function is severely reduced. Left Atrium: Left atrial size was normal in size. Right Atrium: Right atrial size was moderately dilated. Pericardium: There is no evidence of pericardial effusion. Mitral Valve: The mitral valve is normal in structure. No evidence of mitral valve regurgitation. No evidence of mitral valve stenosis. Tricuspid Valve: The tricuspid valve is normal in structure. Tricuspid valve regurgitation is not demonstrated. No evidence of tricuspid stenosis. Aortic Valve: AV and LVOT Dopplers are off axis and likely underestimated. Visually there is no significant stenosis or regurgitation. The aortic valve is tricuspid. Aortic valve regurgitation is not visualized. No aortic stenosis is present. Pulmonic Valve: The pulmonic valve was not well visualized. Pulmonic valve  regurgitation is not visualized. No evidence of pulmonic stenosis. Aorta: The aortic root is normal in size and structure. Pulmonary Artery: Indeterminant PASP, inadequate TR jet. Venous: IVC assessment for right atrial pressure unable to be performed due to mechanical ventilation. IAS/Shunts: No atrial level shunt detected by color flow Doppler.  LEFT VENTRICLE PLAX 2D LVIDd:         4.16 cm  Diastology LVIDs:         3.30 cm  LV e' medial:    6.31 cm/s LV PW:         1.35 cm  LV E/e' medial:  9.3 LV IVS:  1.00 cm  LV e' lateral:   7.51 cm/s LVOT diam:     1.90 cm  LV E/e' lateral: 7.8 LVOT Area:     2.84 cm  RIGHT VENTRICLE TAPSE (M-mode): 1.0 cm LEFT ATRIUM             Index       RIGHT ATRIUM           Index LA diam:        2.60 cm 1.23 cm/m  RA Area:     15.60 cm LA Vol (A2C):   23.8 ml 11.30 ml/m RA Volume:   46.60 ml  22.12 ml/m LA Vol (A4C):   26.7 ml 12.68 ml/m LA Biplane Vol: 26.0 ml 12.34 ml/m   AORTA Ao Root diam: 3.10 cm MITRAL VALVE               TRICUSPID VALVE MV Area (PHT): 3.37 cm    TR Peak grad:   17.0 mmHg MV Decel Time: 225 msec    TR Vmax:        206.00 cm/s MV E velocity: 58.70 cm/s MV A velocity: 42.40 cm/s  SHUNTS MV E/A ratio:  1.38        Systemic Diam: 1.90 cm Carlyle Dolly MD Electronically signed by Carlyle Dolly MD Signature Date/Time: 02/16/2020/12:32:09 PM    Final    CBC Recent Labs  Lab 02/15/20 0142 02/16/20 0804 02/17/20 0601 02/19/20 0439 02/21/20 0418  WBC 19.4* 18.1* 17.7* 13.4* 13.5*  HGB 16.6 15.9 16.7 16.7 16.8  HCT 54.8* 48.9 51.2 56.1* 53.7*  PLT 213 226 240 167 192  MCV 93.5 86.4 87.1 92.4 87.7  MCH 28.3 28.1 28.4 27.5 27.5  MCHC 30.3 32.5 32.6 29.8* 31.3  RDW 19.3* 19.6* 19.6* 20.4* 19.5*    Chemistries  Recent Labs  Lab 02/15/20 0142 02/16/20 0804 02/17/20 0601 02/19/20 0439 02/21/20 0418  NA 136 138 139 147* 144  K 4.4 3.1* 4.1 3.8 3.4*  CL 98 104 104 105 91*  CO2 '25 26 27 ' 33* 41*  GLUCOSE 143* 128* 104* 107* 80  BUN  41* 40* 38* 29* 14  CREATININE 1.47* 1.06 0.96 0.76 0.53*  CALCIUM 9.8 9.1 9.3 9.9 9.7  MG  --  1.9  --   --   --   AST  --  490* 841* 426* 82*  ALT  --  744* 559* 175* 50*  ALKPHOS  --  74 80 89 78  BILITOT  --  0.9 1.0 1.5* 2.4*    No results for input(s): CHOL, HDL, LDLCALC, TRIG, CHOLHDL, LDLDIRECT in the last 72 hours. Lab Results  Component Value Date   HGBA1C 6.9 (H) 02/15/2020    No results for input(s): TSH, T4TOTAL, T3FREE, THYROIDAB in the last 72 hours.  Invalid input(s): FREET3 ------------------------------------------------------------------------------------------------------------------ No results for input(s): VITAMINB12, FOLATE, FERRITIN, TIBC, IRON, RETICCTPCT in the last 72 hours.  Coagulation profile No results for input(s): INR, PROTIME in the last 168 hours.  No results for input(s): DDIMER in the last 72 hours.  Cardiac Enzymes No results for input(s): CKMB, TROPONINI, MYOGLOBIN in the last 168 hours.  Invalid input(s): CK ------------------------------------------------------------------------------------------------------------------    Component Value Date/Time   BNP 867.0 (H) 02/18/2020 1638   Roxan Hockey M.D on 02/21/2020 at 4:24 PM  Go to [email or web address] - for contact info  [facility] - Office  [phone]

## 2020-02-21 NOTE — Progress Notes (Signed)
Physical Therapy Treatment Patient Details Name: Antonio Ramirez MRN: [id number] DOB: 1961/03/01 Today's Date: 02/21/2020    History of Present Illness 59 y.o. male with PMH opiate dependence, COPD, CAD, depression with current c/o of altered mental status. Pt was brought to ED on 11/1 with AMS but pt resolved back to baseline and was d/c home.    PT Comments    Pt with divided attention, difficulty maintaining attention to task requiring redirection and increased time to complete single step commands. Pt inches BLE over to EOB requiring max assist to upright trunk and scoot hips forward. Pt attempts to maintain sidesitting propped on forearms despite cues from therapist. Pt assisted back to supine with max assist to lift BLE back into bed. Upon return to bed, linen noted to be soiled with urine and bowel movement. Therapist cued pt with turning, reaching with UE and pushing through LE to rotate trunk into sidelying while nurse tech cleaned pt and changed linen. Pt requiring max assist with initial roll reps with attention on returning home despite cues. Once pt focused on rolling, pt able to use bedrail to roll into sidelying with min guard assist. Pt with nasal canula shifted out of nose upon arrival and SpO2 85%, returned 4L O2 with SpO2 >90% within a few breaths when cued. Pt will benefit from continued physical therapy in hospital and recommendations below to increase strength, balance, endurance for safe ADLs and gait.   Follow Up Recommendations  SNF     Equipment Recommendations  None recommended by PT    Recommendations for Other Services       Precautions / Restrictions Precautions Precautions: Fall Precaution Comments: monitor SpO2 Restrictions Weight Bearing Restrictions: No    Mobility  Bed Mobility Overal bed mobility: Needs Assistance Bed Mobility: Rolling;Supine to Sit;Sit to Supine Rolling: Max assist;Min guard  Supine to sit: Max assist Sit to supine: Max  assist   General bed mobility comments: pt able to inch BLE over to bedside requiring max A to upright trunk into sitting; max A to return to supine to lift BLE back into bed; max A to roll side to side for NT to clean and change linen with initial attempts, then able to perform with min G after a few reps  Transfers  General transfer comment: not attempted  Ambulation/Gait  General Gait Details: not attempted   Stairs             Wheelchair Mobility    Modified Rankin (Stroke Patients Only)       Balance Overall balance assessment: Needs assistance Sitting-balance support: Feet supported;Bilateral upper extremity supported Sitting balance-Leahy Scale: Poor Sitting balance - Comments: leaning on forearms when seated EOB, unable to maintain upright trunk       Cognition Arousal/Alertness: Awake/alert Behavior During Therapy: Flat affect Overall Cognitive Status: No family/caregiver present to determine baseline cognitive functioning  General Comments: Pt oriented to self and location of hospital. Pt disoriented to situation and date, but able to state current president. Pt with tangential conversation, requires redirection to task and increased time to follow single step commands.      Exercises      General Comments General comments (skin integrity, edema, etc.): Pt with nasal cannula shifted off upon arrival and SpO2 85%, returned and SpO2 92% with a few breaths. Pt on 4L O2 throughout session with SpO2 >90.      Pertinent Vitals/Pain Pain Assessment: No/denies pain    Home Living  Prior Function            PT Goals (current goals can now be found in the care plan section) Acute Rehab PT Goals Patient Stated Goal: "call my lady" PT Goal Formulation: With patient Time For Goal Achievement: 03/05/20 Potential to Achieve Goals: Fair Progress towards PT goals: Progressing toward goals    Frequency    Min 3X/week       PT Plan Current plan remains appropriate    Co-evaluation              AM-PAC PT "6 Clicks" Mobility   Outcome Measure  Help needed turning from your back to your side while in a flat bed without using bedrails?: A Lot Help needed moving from lying on your back to sitting on the side of a flat bed without using bedrails?: A Lot Help needed moving to and from a bed to a chair (including a wheelchair)?: Total Help needed standing up from a chair using your arms (e.g., wheelchair or bedside chair)?: Total Help needed to walk in hospital room?: Total Help needed climbing 3-5 steps with a railing? : Total 6 Click Score: 8    End of Session Equipment Utilized During Treatment: Oxygen Activity Tolerance: Patient limited by fatigue;Patient limited by lethargy Patient left: in bed;with call bell/phone within reach;with nursing/sitter in room Nurse Communication: Mobility status;Other (comment) (linen soiled) PT Visit Diagnosis: Unsteadiness on feet (R26.81);Other abnormalities of gait and mobility (R26.89);Muscle weakness (generalized) (M62.81)     Time: 8309-4076 PT Time Calculation (min) (ACUTE ONLY): 30 min  Charges:  $Therapeutic Activity: 23-37 mins                      Tori Maureen Duesing PT, DPT 02/21/20, 11:39 AM [phone]

## 2020-02-21 NOTE — TOC Initial Note (Addendum)
Transition of Care [facility]) - Initial/Assessment Note    Patient Details  Name: Antonio Ramirez MRN: [id number] Date of Birth: May 24, 1960  [facility] CM/SW Contact:    Shade Flood, LCSW Phone Number: 02/21/2020, 10:49 AM  Clinical Narrative:                  Pt admitted from home. PT recommending SNF rehab at dc. Pt is not fully oriented. Spoke with pt's Deberah Castle, Katha Cabal, by phone this AM. Per Katha Cabal, pt will agree to SNF rehab. She states that he has gone to rehab several times in the past. Discussed CMS provider options and will refer as requested. Pt will need insurance authorization prior to transfer to SNF. TOC will start auth today.  TOC will follow and assist with dc planning.  Expected Discharge Plan: Skilled Nursing Facility Barriers to Discharge: Continued Medical Work up   Patient Goals and CMS Choice Patient states their goals for this hospitalization and ongoing recovery are:: rehab and go home CMS Medicare.gov Compare Post Acute Care list provided to:: Patient Represenative (must comment) Choice offered to / list presented to : Spouse  Expected Discharge Plan and Services Expected Discharge Plan: [place] In-house Referral: Clinical Social Work   Post Acute Care Choice: [place] Living arrangements for the past 2 months: [place]                                      Prior Living Arrangements/Services Living arrangements for the past 2 months: Single Family Home Lives with:: Significant Other Patient language and need for interpreter reviewed:: Yes Do you feel safe going back to the place where you live?: Yes      Need for Family Participation in Patient Care: Yes (Comment) Care giver support system in place?: Yes (comment) Current home services: DME Criminal Activity/Legal Involvement Pertinent to Current Situation/Hospitalization: No - Comment as needed  Activities of Daily Living Home  Assistive Devices/Equipment: None ADL Screening (condition at time of admission) Patient's cognitive ability adequate to safely complete daily activities?: No Is the patient deaf or have difficulty hearing?: No Does the patient have difficulty seeing, even when wearing glasses/contacts?: No Does the patient have difficulty concentrating, remembering, or making decisions?: No Patient able to express need for assistance with ADLs?: No Does the patient have difficulty dressing or bathing?: No Independently performs ADLs?: Yes (appropriate for developmental age) Does the patient have difficulty walking or climbing stairs?: No Weakness of Legs: None Weakness of Arms/Hands: None  Permission Sought/Granted Permission sought to share information with : Facility Art therapist granted to share information with : Yes, Verbal Permission Granted     Permission granted to share info w AGENCY: snfs        Emotional Assessment   Attitude/Demeanor/Rapport: Unable to Assess Affect (typically observed): Unable to Assess Orientation: : Oriented to Self Alcohol / Substance Use: Not Applicable Psych Involvement: No (comment)  Admission diagnosis:  Acute respiratory failure (HCC) [J96.00] Acute respiratory failure with hypercapnia (HCC) [J96.02] Altered mental status, unspecified altered mental status type [R41.82] Patient Active Problem List   Diagnosis Date Noted  . Altered mental status   . Endotracheally intubated 02/15/2020  . Acute respiratory failure ([place]) 02/14/2020  . Pressure injury of skin 06/30/2019  . Sepsis ([place]) 06/29/2019  . Acute respiratory failure with hypoxia ([place]) 06/01/2019  . COPD (chronic obstructive  pulmonary disease) ([place])   . COPD with acute exacerbation ([place])   . Current every day smoker   . Opioid dependence ([place])   . Depression   . Hyperlipidemia   . CAD (coronary artery disease)   . Elevated troponin   . AKI (acute kidney injury) ([place])   .  Hyperglycemia   . Tobacco abuse   . Demand ischemia of myocardium ([place])   . Hyperlipidemia LDL goal <70    PCP:  [name], DO Pharmacy:   [facility] [address] [place] [address] Phone: [phone] Fax: [phone]     Social Determinants of Health (SDOH) Interventions    Readmission Risk Interventions Readmission Risk Prevention Plan 02/21/2020 02/16/2020  Transportation Screening Complete Complete  Social Work Consult for [place] Planning/Counseling Complete -  [place] Screening Not Applicable Not Applicable  Medication Review Press photographer) Complete Complete  Some recent data might be hidden

## 2020-02-21 NOTE — Progress Notes (Signed)
Nutrition Follow-up  DOCUMENTATION CODES:   Not applicable  INTERVENTION:  Ensure Enlive po TID, each supplement provides 350 kcal and 20 grams of protein (chocolate)  MVI with minerals daily  Advance diet as medically feasible  NUTRITION DIAGNOSIS:   Inadequate oral intake related to inability to eat as evidenced by NPO status. -progressing, diet advanced to full liquids   GOAL:   Patient will meet greater than or equal to 90% of their needs -addressing via ONS   MONITOR:   Labs, I & O's, Vent status, Diet advancement, Weight trends  REASON FOR ASSESSMENT:   Ventilator    ASSESSMENT:   59 year old male with history significant for chronic back pain, opiate dependence, COPD, CAD s/p bypass surgery presented with AMS after presenting the day prior with same then discharged home after AMS resolved and at baseline. Pt returned with decline in level of consciousness, work-up with significant respiratory acidosis, O2 sats 90% on 4L and placed on 15 L NRB. UDS positive for opiates and cannabinoids s/p  Narcan without improvement and required intubation for acute hypoxic and hypercarbic respiratory failure.  11/2-intubated 11/5-extubated 11/7-initiated BiPAP  Patient awake this afternoon and off BiPAP, currently only requiring at night. He reports appetite is so so, says he had some juice and a pancake for lunch. Currently on a full liquid diet, no documented intakes for review. Patient recalls good appetite at home, drinks a couple of Boost daily and states his old lady is a Systems developer, likes her homemade pizza and fried chicken. He has poor dentition, missing multiple teeth on top/bottom, denies chewing swallowing difficulties. RD educated on the importance of nutrition and encouraged po intake of meals. He is agreeable to drinking chocolate Ensure to aid with meeting needs.  Pt recalls usually weighing around 177 lbs. On admission he weighed 83.1 kg (182.82 lbs) and have  remained fairly stable during admission. Current wt 82.5 kg I/Os: -2889.1 ml since admit UOP: 2950 ml since admit   Medications reviewed and include: Colace, SSI, IV Pepcid 20 mg every 12 hrs  D5 NaCl @ 50 ml/hr (204 kcal)  Labs: CBGs 105,101,99,104 K 3.4 (L) Cr 0.53 (L), WBC 13.5 (H)  NUTRITION - FOCUSED PHYSICAL EXAM: Completed, no fat/muscle depletions  +3 BLE edema   Diet Order:   Diet Order            Diet full liquid Room service appropriate? Yes; Fluid consistency: Thin  Diet effective now                 EDUCATION NEEDS:   No education needs have been identified at this time  Skin:  Skin Assessment: Reviewed RN Assessment  Last BM:  11/8  Height:   Ht Readings from Last 1 Encounters:  02/16/20 6\' 1"  (1.854 m)    Weight:   Wt Readings from Last 1 Encounters:  02/19/20 82.5 kg     BMI:  Body mass index is 24 kg/m.  Estimated Nutritional Needs: New s/p extubation  Kcal:  2250-2415  Protein:  113-125  Fluid:  >/= 2.2 L/day   Lajuan Lines, RD, LDN Clinical Nutrition After Hours/Weekend Pager # in [place]

## 2020-02-22 ENCOUNTER — Inpatient Hospital Stay (HOSPITAL_COMMUNITY): Payer: Medicare Other

## 2020-02-22 ENCOUNTER — Inpatient Hospital Stay (HOSPITAL_COMMUNITY)
Admit: 2020-02-22 | Discharge: 2020-02-22 | Disposition: A | Discharge status: Home / Self Care | Payer: Medicare Other | Attending: Internal Medicine | Admitting: Internal Medicine

## 2020-02-22 DIAGNOSIS — F1129 Opioid dependence with unspecified opioid-induced disorder: Secondary | ICD-10-CM | POA: Diagnosis not present

## 2020-02-22 DIAGNOSIS — R4182 Altered mental status, unspecified: Secondary | ICD-10-CM | POA: Diagnosis not present

## 2020-02-22 DIAGNOSIS — J9602 Acute respiratory failure with hypercapnia: Secondary | ICD-10-CM | POA: Diagnosis not present

## 2020-02-22 DIAGNOSIS — J441 Chronic obstructive pulmonary disease with (acute) exacerbation: Secondary | ICD-10-CM | POA: Diagnosis not present

## 2020-02-22 DIAGNOSIS — R000 Tachycardia, unspecified: Secondary | ICD-10-CM

## 2020-02-22 DIAGNOSIS — R569 Unspecified convulsions: Secondary | ICD-10-CM

## 2020-02-22 LAB — BLOOD GAS, ARTERIAL
Acid-Base Excess: 21.8 mmol/L — ABNORMAL HIGH (ref 0.0–2.0)
Bicarbonate: 43.1 mmol/L — ABNORMAL HIGH (ref 20.0–28.0)
FIO2: 28
O2 Saturation: 92.3 %
Patient temperature: 38
pCO2 arterial: 69.1 mmHg (ref 32.0–48.0)
pH, Arterial: 7.456 — ABNORMAL HIGH (ref 7.350–7.450)
pO2, Arterial: 68.3 mmHg — ABNORMAL LOW (ref 83.0–108.0)

## 2020-02-22 LAB — GLUCOSE, CAPILLARY
Glucose-Capillary: 100 mg/dL — ABNORMAL HIGH (ref 70–99)
Glucose-Capillary: 116 mg/dL — ABNORMAL HIGH (ref 70–99)
Glucose-Capillary: 91 mg/dL (ref 70–99)
Glucose-Capillary: 99 mg/dL (ref 70–99)

## 2020-02-22 LAB — MAGNESIUM: Magnesium: 1.8 mg/dL (ref 1.7–2.4)

## 2020-02-22 LAB — CBC
HCT: 52.4 % — ABNORMAL HIGH (ref 39.0–52.0)
Hemoglobin: 16.2 g/dL (ref 13.0–17.0)
MCH: 27 pg (ref 26.0–34.0)
MCHC: 30.9 g/dL (ref 30.0–36.0)
MCV: 87.5 fL (ref 80.0–100.0)
Platelets: 191 10*3/uL (ref 150–400)
RBC: 5.99 MIL/uL — ABNORMAL HIGH (ref 4.22–5.81)
RDW: 19.2 % — ABNORMAL HIGH (ref 11.5–15.5)
WBC: 10.3 10*3/uL (ref 4.0–10.5)
nRBC: 0 % (ref 0.0–0.2)

## 2020-02-22 LAB — BASIC METABOLIC PANEL
Anion gap: 9 (ref 5–15)
BUN: 15 mg/dL (ref 6–20)
CO2: 41 mmol/L — ABNORMAL HIGH (ref 22–32)
Calcium: 9.6 mg/dL (ref 8.9–10.3)
Chloride: 89 mmol/L — ABNORMAL LOW (ref 98–111)
Creatinine, Ser: 0.55 mg/dL — ABNORMAL LOW (ref 0.61–1.24)
GFR, Estimated: >60 mL/min (ref >60)
Glucose, Bld: 103 mg/dL — ABNORMAL HIGH (ref 70–99)
Potassium: 3 mmol/L — ABNORMAL LOW (ref 3.5–5.1)
Sodium: 139 mmol/L (ref 135–145)

## 2020-02-22 MED ORDER — POTASSIUM CHLORIDE 10 MEQ/100ML IV SOLN
10.0000 meq | INTRAVENOUS | Status: AC
  Administered 2020-02-22 (×4): 10 meq via INTRAVENOUS
  Filled 2020-02-22 (×4): qty 100

## 2020-02-22 MED ORDER — LEVETIRACETAM IN NACL 1000 MG/100ML IV SOLN
1000.0000 mg | Freq: Once | INTRAVENOUS | Status: AC
  Administered 2020-02-22: 1000 mg via INTRAVENOUS
  Filled 2020-02-22: qty 100

## 2020-02-22 MED ORDER — SODIUM CHLORIDE 0.9 % IV SOLN
40.0000 mg/kg | INTRAVENOUS | Status: AC
  Administered 2020-02-22: 3300 mg via INTRAVENOUS
  Filled 2020-02-22: qty 5

## 2020-02-22 MED ORDER — METOPROLOL TARTRATE 25 MG PO TABS
37.5000 mg | ORAL_TABLET | Freq: Two times a day (BID) | ORAL | Status: DC
  Filled 2020-02-22: qty 2

## 2020-02-22 MED ORDER — METOPROLOL TARTRATE 5 MG/5ML IV SOLN
5.0000 mg | Freq: Once | INTRAVENOUS | Status: AC
  Administered 2020-02-22: 5 mg via INTRAVENOUS
  Filled 2020-02-22: qty 5

## 2020-02-22 MED ORDER — LEVETIRACETAM IN NACL 500 MG/100ML IV SOLN
500.0000 mg | Freq: Three times a day (TID) | INTRAVENOUS | Status: DC
  Administered 2020-02-23 – 2020-02-24 (×4): 500 mg via INTRAVENOUS
  Filled 2020-02-22 (×6): qty 100

## 2020-02-22 MED ORDER — MAGNESIUM SULFATE 2 GM/50ML IV SOLN
2.0000 g | Freq: Once | INTRAVENOUS | Status: AC
  Administered 2020-02-22: 2 g via INTRAVENOUS
  Filled 2020-02-22: qty 50

## 2020-02-22 MED ORDER — LEVETIRACETAM 500 MG/5ML IV SOLN
INTRAVENOUS | Status: AC
  Filled 2020-02-22: qty 30

## 2020-02-22 NOTE — Progress Notes (Signed)
Pt continues to sleep intermittently, calm. When awake, mumbling, occasionally says, "Hey Baby!", but still no appropriate response to questions and does not follow commands. Oral care given. Pt continues with occasional congested sounding cough, no resp distress noted. SaO2 95% on 4 lpm [place]. Pt seen and evaluated by MD [place], orders received.

## 2020-02-22 NOTE — Progress Notes (Signed)
Pt awake and more alert. Pulling at gown and covers. Still no deliberate verbal response when spoken to. No further jerking movements noted. Central Tele advises that pt has had 2 separate episodes of bursts of SVT in 170's, then returned to [place]. MD Memon notified of same and current pt condition.

## 2020-02-22 NOTE — Progress Notes (Signed)
In to evaluate/assess pt, pt with eyes closed but yelling, "Hey! Baby!" but does not respond when spoken to. Noted jerking movements to right side body and head. Leaning to left side, does move head left and right voluntarily. Pulling at bi-pap mask.  RT in and removed BiPAP. Resp rate 20-22/min, nonlabored. Placed on 2lpm  and nebulizer. Pt followed command to raise head and arm, but only moves left arm. Right arm flaccid. Head turned to left, closes eyelids when fingers flicked at eyes. MD Memon notified of current pt condition and behavior.

## 2020-02-22 NOTE — Progress Notes (Signed)
Neurosurgery  Asked by the primary team to review CT head performed on this patient with altered mental status.  The exam is significantly limited by motion artifact.  However, no significant lesion or hemorrhage is seen that would require surgical evaluation. A fair amount of ischemic disease burden in the brain is seen. There is possibly small convexity SAH seen over the right side, though this could represent motion artifact.  I would recommend holding his dual antiplatelets for now and repeating a CT in 24 hours.  If CT is of better quality and no ICH is confirmed, he can safely resume his antiplatelets.  If small ICH is better delineated, would recommend holding for 1 week before resuming.

## 2020-02-22 NOTE — Progress Notes (Signed)
MD Memon in room to assess pt. ABG ordered. Pt does not follow commands.

## 2020-02-22 NOTE — Progress Notes (Signed)
PROGRESS NOTE    Antonio Ramirez  HAL:[id number] DOB: 08/08/1960 DOA: 02/14/2020 PCP: [name], DO    Brief Narrative:  59 year old male with a history of COPD, coronary artery disease, opiate dependence, presents with acute respiratory failure requiring intubation and mechanical ventilation.  Patient was found to have COPD exacerbation with elevated PCO2.  He was intubated on 11/2 extubated on 11/5.  Hospital course further complicated by seizures and change in mental status.  CT head showed small subarachnoid hemorrhage.  Neurology following and has been started on Keppra.   Assessment & Plan:   Active Problems:   Acute respiratory failure with hypoxia (HCC)   COPD with acute exacerbation (HCC)   Opioid dependence ([place])   Depression   CAD (coronary artery disease)   Endotracheally intubated   Altered mental status   Acute encephalopathy.   -Patient noted to be lethargic -Concerns for underlying seizure -Respiratory acidosis appears to be compensated -Mental status has not returned to baseline yet -CT head shows small subarachnoid hemorrhage which was not felt to be because of his mental status change -Continue to monitor  Seizure with concerns for status epilepticus -Appreciate neurology assistance -EEG did not show any epileptiform discharges -He has been started on loading dose of Keppra as well as maintenance dosing -Repeat EEG will be obtained  Small subarachnoid hemorrhage -CT head reviewed with neurosurgery, Dr. Marcello Moores -It was not felt that surgical intervention was indicated at this time -Recommendations were to hold antiplatelet agents -Repeat CT head in a.m. for surveillance  Acute hypoxic and hypercapnic respiratory failure -Felt to be related to COPD as well as opiate use -Followed by pulmonology -Intubated from 11/2-11/5 -Overall blood gas has improved -Respiratory acidosis is now compensated -Continue BiPAP nightly  Staph epi  bacteremia -Transthoracic echocardiogram without evidence of endocarditis -Completed Ancef on 11/7  COPD exacerbation -Treated with steroids, bronchodilators -Overall respiratory status appears to be stable -Followed by pulmonology  Acute kidney injury -Baseline creatinine 1.5 -On admission, creatinine noted to be elevated at 1.8 -AKI has resolved with hydration  Chronic systolic congestive heart failure, EF of 45 to 50% -Volume status appears to be stable -Continue Imdur, losartan and metoprolol -Cardiology following  Tachyarrhythmias -Noted to have transient SVT versus A. fib with RVR -Cardiology did not recommend anticoagulation due to poor compliance and low A. fib burden  Generalized weakness -Seen by physical therapy with recommendation for skilled nursing facility placement  DVT prophylaxis: Place and maintain sequential compression device Start: 02/20/20 1656 Place TED hose Start: 02/20/20 1656  Code Status: Full code Family Communication: Updated patient's POA Lisa over phone Disposition Plan: Status is: Inpatient  Remains inpatient appropriate because:Inpatient level of care appropriate due to severity of illness   Dispo: The patient is from: Home              Anticipated d/c is to: SNF              Anticipated d/c date is: 3 days              Patient currently is not medically stable to d/c.   Consultants:   Pulmonology  Neurology  Procedures:   ETT 11/2-11/5  Antimicrobials:      Subjective: Noted to have jerking movements and right upper and lower extremity earlier this morning by staff.  During my visit, he is lethargic, does not follow commands or answer questions.  Objective: Vitals:   02/22/20 1215 02/22/20 1442 02/22/20 1532 02/22/20 1835  BP:    (!) 146/82  Pulse: 70  62 69  Resp: '16  20 17  ' Temp:    99.3 F (37.4 C)  TempSrc:    Oral  SpO2: 90% (!) 89% 98% 97%  Weight:      Height:        Intake/Output Summary (Last 24  hours) at 02/22/2020 1951 Last data filed at 02/21/2020 2200 Gross per 24 hour  Intake 58.7 ml  Output --  Net 58.7 ml   Filed Weights   02/16/20 0343 02/17/20 0630 02/19/20 0500  Weight: 86.3 kg 86.2 kg 82.5 kg    Examination:  General exam: Unresponsive Respiratory system: Clear to auscultation. Respiratory effort normal. Cardiovascular system: S1 & S2 heard, RRR. No JVD, murmurs, rubs, gallops or clicks. No pedal edema. Gastrointestinal system: Abdomen is nondistended, soft and nontender. No organomegaly or masses felt. Normal bowel sounds heard. Central nervous system: Does not follow commands, but appears to have less movement in right upper extremity Extremities: No edema bilaterally Skin: No rashes, lesions or ulcers Psychiatry: lethargic    Data Reviewed: I have personally reviewed following labs and imaging studies  CBC: Recent Labs  Lab 02/16/20 0804 02/17/20 0601 02/19/20 0439 02/21/20 0418 02/22/20 0432  WBC 18.1* 17.7* 13.4* 13.5* 10.3  HGB 15.9 16.7 16.7 16.8 16.2  HCT 48.9 51.2 56.1* 53.7* 52.4*  MCV 86.4 87.1 92.4 87.7 87.5  PLT 226 240 167 192 017   Basic Metabolic Panel: Recent Labs  Lab 02/16/20 0804 02/17/20 0601 02/19/20 0439 02/21/20 0418 02/22/20 0432  NA 138 139 147* 144 139  K 3.1* 4.1 3.8 3.4* 3.0*  CL 104 104 105 91* 89*  CO2 26 27 33* 41* 41*  GLUCOSE 128* 104* 107* 80 103*  BUN 40* 38* 29* 14 15  CREATININE 1.06 0.96 0.76 0.53* 0.55*  CALCIUM 9.1 9.3 9.9 9.7 9.6  MG 1.9  --   --   --  1.8   GFR: Estimated Creatinine Clearance: 111 mL/min (A) (by C-G formula based on SCr of 0.55 mg/dL (L)). Liver Function Tests: Recent Labs  Lab 02/16/20 0804 02/17/20 0601 02/19/20 0439 02/21/20 0418  AST 490* 841* 426* 82*  ALT 744* 559* 175* 50*  ALKPHOS 74 80 89 78  BILITOT 0.9 1.0 1.5* 2.4*  PROT 5.3* 5.4* 6.2* 6.1*  ALBUMIN 2.6* 2.7* 3.2* 3.1*   No results for input(s): LIPASE, AMYLASE in the last 168 hours. Recent Labs  Lab  02/19/20 0735  AMMONIA 26   Coagulation Profile: No results for input(s): INR, PROTIME in the last 168 hours. Cardiac Enzymes: No results for input(s): CKTOTAL, CKMB, CKMBINDEX, TROPONINI in the last 168 hours. BNP (last 3 results) No results for input(s): PROBNP in the last 8760 hours. HbA1C: No results for input(s): HGBA1C in the last 72 hours. CBG: Recent Labs  Lab 02/21/20 1539 02/21/20 1927 02/22/20 0803 02/22/20 1155 02/22/20 1647  GLUCAP 105* 128* 100* 116* 91   Lipid Profile: No results for input(s): CHOL, HDL, LDLCALC, TRIG, CHOLHDL, LDLDIRECT in the last 72 hours. Thyroid Function Tests: No results for input(s): TSH, T4TOTAL, FREET4, T3FREE, THYROIDAB in the last 72 hours. Anemia Panel: No results for input(s): VITAMINB12, FOLATE, FERRITIN, TIBC, IRON, RETICCTPCT in the last 72 hours. Sepsis Labs: No results for input(s): PROCALCITON, LATICACIDVEN in the last 168 hours.  Recent Results (from the past 240 hour(s))  Respiratory Panel by RT PCR (Flu A&B, Covid) - Nasopharyngeal Swab     Status: None  Collection Time: 02/13/20  4:51 PM   Specimen: Nasopharyngeal Swab  Result Value Ref Range Status   SARS Coronavirus 2 by RT PCR NEGATIVE NEGATIVE Final    Comment: (NOTE) SARS-CoV-2 target nucleic acids are NOT DETECTED.  The SARS-CoV-2 RNA is generally detectable in upper respiratoy specimens during the acute phase of infection. The lowest concentration of SARS-CoV-2 viral copies this assay can detect is 131 copies/mL. A negative result does not preclude SARS-Cov-2 infection and should not be used as the sole basis for treatment or other patient management decisions. A negative result may occur with  improper specimen collection/handling, submission of specimen other than nasopharyngeal swab, presence of viral mutation(s) within the areas targeted by this assay, and inadequate number of viral copies (<131 copies/mL). A negative result must be combined with  clinical observations, patient history, and epidemiological information. The expected result is Negative.  Fact Sheet for Patients:  PinkCheek.be  Fact Sheet for Healthcare Providers:  GravelBags.it  This test is no t yet approved or cleared by the [place] FDA and  has been authorized for detection and/or diagnosis of SARS-CoV-2 by FDA under an Emergency Use Authorization (EUA). This EUA will remain  in effect (meaning this test can be used) for the duration of the COVID-19 declaration under Section 564(b)(1) of the Act, 21 U.S.C. section 360bbb-3(b)(1), unless the authorization is terminated or revoked sooner.     Influenza A by PCR NEGATIVE NEGATIVE Final   Influenza B by PCR NEGATIVE NEGATIVE Final    Comment: (NOTE) The Xpert Xpress SARS-CoV-2/FLU/RSV assay is intended as an aid in  the diagnosis of influenza from Nasopharyngeal swab specimens and  should not be used as a sole basis for treatment. Nasal washings and  aspirates are unacceptable for Xpert Xpress SARS-CoV-2/FLU/RSV  testing.  Fact Sheet for Patients: PinkCheek.be  Fact Sheet for Healthcare Providers: GravelBags.it  This test is not yet approved or cleared by the [place] FDA and  has been authorized for detection and/or diagnosis of SARS-CoV-2 by  FDA under an Emergency Use Authorization (EUA). This EUA will remain  in effect (meaning this test can be used) for the duration of the  Covid-19 declaration under Section 564(b)(1) of the Act, 21  U.S.C. section 360bbb-3(b)(1), unless the authorization is  terminated or revoked. Performed at [facility], [address] [place], [place] [address]   Culture, blood (Routine x 2)     Status: Abnormal   Collection Time: 02/13/20  4:52 PM   Specimen: Right Antecubital; Blood  Result Value Ref Range Status   Specimen Description   Final     RIGHT ANTECUBITAL Performed at [facility], [address] [place], [place] [address]    Special Requests   Final    BOTTLES DRAWN AEROBIC AND ANAEROBIC Blood Culture adequate volume Performed at [facility], [address] [place], [place] [address]    Culture  Setup Time   Final    GRAM POSITIVE COCCI AEROBIC BOTTLE ONLY CRITICAL RESULT CALLED TO, READ BACK BY AND VERIFIED WITH: G. Coffee PharmD 9:35 02/15/20 ([email or web address])    Culture (A)  Final    STAPHYLOCOCCUS EPIDERMIDIS THE SIGNIFICANCE OF ISOLATING THIS ORGANISM FROM A SINGLE SET OF BLOOD CULTURES WHEN MULTIPLE SETS ARE DRAWN IS UNCERTAIN. PLEASE NOTIFY THE MICROBIOLOGY DEPARTMENT WITHIN ONE WEEK IF SPECIATION AND SENSITIVITIES ARE REQUIRED. Performed at [facility], [place] [address] [place], [place] [address]    Report Status 02/16/2020 FINAL  Final  Blood Culture  ID Panel (Reflexed)     Status: Abnormal   Collection Time: 02/13/20  4:52 PM  Result Value Ref Range Status   Enterococcus faecalis NOT DETECTED NOT DETECTED Final   Enterococcus Faecium NOT DETECTED NOT DETECTED Final   Listeria monocytogenes NOT DETECTED NOT DETECTED Final   Staphylococcus species DETECTED (A) NOT DETECTED Final    Comment: CRITICAL RESULT CALLED TO, READ BACK BY AND VERIFIED WITH: G. Coffee PharmD 9:35 02/15/20 (wilsonm)    Staphylococcus aureus (BCID) NOT DETECTED NOT DETECTED Final   Staphylococcus epidermidis DETECTED (A) NOT DETECTED Final    Comment: CRITICAL RESULT CALLED TO, READ BACK BY AND VERIFIED WITH: G. Coffee PharmD 9:35 02/15/20 (wilsonm)    Staphylococcus lugdunensis NOT DETECTED NOT DETECTED Final   Streptococcus species NOT DETECTED NOT DETECTED Final   Streptococcus agalactiae NOT DETECTED NOT DETECTED Final   Streptococcus pneumoniae NOT DETECTED NOT DETECTED Final   Streptococcus pyogenes NOT DETECTED NOT DETECTED Final   A.calcoaceticus-baumannii NOT DETECTED NOT DETECTED Final   Bacteroides fragilis NOT DETECTED NOT  DETECTED Final   Enterobacterales NOT DETECTED NOT DETECTED Final   Enterobacter cloacae complex NOT DETECTED NOT DETECTED Final   Escherichia coli NOT DETECTED NOT DETECTED Final   Klebsiella aerogenes NOT DETECTED NOT DETECTED Final   Klebsiella oxytoca NOT DETECTED NOT DETECTED Final   Klebsiella pneumoniae NOT DETECTED NOT DETECTED Final   Proteus species NOT DETECTED NOT DETECTED Final   Salmonella species NOT DETECTED NOT DETECTED Final   Serratia marcescens NOT DETECTED NOT DETECTED Final   Haemophilus influenzae NOT DETECTED NOT DETECTED Final   Neisseria meningitidis NOT DETECTED NOT DETECTED Final   Pseudomonas aeruginosa NOT DETECTED NOT DETECTED Final   Stenotrophomonas maltophilia NOT DETECTED NOT DETECTED Final   Candida albicans NOT DETECTED NOT DETECTED Final   Candida auris NOT DETECTED NOT DETECTED Final   Candida glabrata NOT DETECTED NOT DETECTED Final   Candida krusei NOT DETECTED NOT DETECTED Final   Candida parapsilosis NOT DETECTED NOT DETECTED Final   Candida tropicalis NOT DETECTED NOT DETECTED Final   Cryptococcus neoformans/gattii NOT DETECTED NOT DETECTED Final   Methicillin resistance mecA/C NOT DETECTED NOT DETECTED Final    Comment: Performed at [facility], [address] [place], [place] [address]  Culture, blood (Routine x 2)     Status: None   Collection Time: 02/13/20  5:44 PM   Specimen: Right Antecubital; Blood  Result Value Ref Range Status   Specimen Description RIGHT ANTECUBITAL  Final   Special Requests   Final    BOTTLES DRAWN AEROBIC AND ANAEROBIC Blood Culture adequate volume   Culture   Final    NO GROWTH 6 DAYS Performed at [facility], [address], [place], [place] [address]    Report Status 02/19/2020 FINAL  Final  Culture, Urine     Status: None   Collection Time: 02/14/20  9:26 PM   Specimen: Urine, Clean Catch  Result Value Ref Range Status   Specimen Description   Final    URINE, CLEAN CATCH Performed at [facility], [address] [place], [place] [address]    Special Requests   Final    NONE Performed at [facility], [address], [place], [place] [address]    Culture   Final    NO GROWTH Performed at [facility], [place] [address] [place], [place] [address]    Report Status 02/16/2020 FINAL  Final  Culture, blood (routine x 2)  Status: None   Collection Time: 02/14/20  9:44 PM   Specimen: BLOOD RIGHT HAND  Result Value Ref Range Status   Specimen Description BLOOD RIGHT HAND  Final   Special Requests   Final    BOTTLES DRAWN AEROBIC ONLY Blood Culture adequate volume   Culture   Final    NO GROWTH 5 DAYS Performed at [facility], [address] [place], [place] [address]    Report Status 02/19/2020 FINAL  Final  Culture, blood (routine x 2)     Status: None   Collection Time: 02/14/20  9:58 PM   Specimen: BLOOD LEFT HAND  Result Value Ref Range Status   Specimen Description BLOOD LEFT HAND  Final   Special Requests   Final    BOTTLES DRAWN AEROBIC ONLY Blood Culture adequate volume   Culture   Final    NO GROWTH 5 DAYS Performed at [facility], [address], [place], [place] [address]    Report Status 02/19/2020 FINAL  Final  MRSA PCR Screening     Status: None   Collection Time: 02/15/20 12:05 AM   Specimen: Nasal Mucosa; Nasopharyngeal  Result Value Ref Range Status   MRSA by PCR NEGATIVE NEGATIVE Final    Comment:        The GeneXpert MRSA Assay (FDA approved for NASAL specimens only), is one component of a comprehensive MRSA colonization surveillance program. It is not intended to diagnose MRSA infection nor to guide or monitor treatment for MRSA infections. Performed at [facility], [address] [place], [place] [address]   Culture, blood (Routine X 2) w Reflex to ID Panel     Status: None   Collection Time: 02/16/20  2:33 PM   Specimen: BLOOD RIGHT HAND  Result Value Ref Range Status   Specimen Description BLOOD RIGHT HAND  Final   Special  Requests   Final    BOTTLES DRAWN AEROBIC AND ANAEROBIC Blood Culture adequate volume   Culture   Final    NO GROWTH 5 DAYS Performed at [facility], [address] [place], [place] [address]    Report Status 02/21/2020 FINAL  Final  Culture, blood (Routine X 2) w Reflex to ID Panel     Status: None   Collection Time: 02/16/20  2:33 PM   Specimen: BLOOD LEFT HAND  Result Value Ref Range Status   Specimen Description BLOOD LEFT HAND  Final   Special Requests   Final    BOTTLES DRAWN AEROBIC AND ANAEROBIC Blood Culture results may not be optimal due to an inadequate volume of blood received in culture bottles   Culture   Final    NO GROWTH 5 DAYS Performed at [facility], [address], [place], [place] [address]    Report Status 02/21/2020 FINAL  Final         Radiology Studies: EEG  Result Date: 02/22/2020 Lora Havens, MD     02/22/2020 12:34 PM Patient Name: Antonio Ramirez MRN: [id number] Epilepsy Attending: Lora Havens Referring Physician/Provider: Dr Kathie Dike Date: 02/22/2020 Duration: 26.13 mins Patient history: 59 year old male with chronic right parietal and temporal strokes who presented with altered mental status.  EEG to evaluate for seizures. Level of alertness: Awake AEDs during EEG study: None Technical aspects: This EEG study was done with scalp electrodes positioned according to the 10-20 International system of electrode placement. Electrical activity was acquired at a sampling rate of '500Hz'  and reviewed with a high frequency filter of  '70Hz'  and a low frequency filter of '1Hz' . EEG data were recorded continuously and digitally stored. Description: No clear posterior dominant rhythm was seen.  EEG showed continuous generalized 3 to 6 Hz theta delta slowing.  Hyperventilation and photic stimulation were not performed.   ABNORMALITY -Continuous slow, generalized IMPRESSION: This study is suggestive of moderate diffuse encephalopathy, nonspecific etiology. No  seizures or epileptiform discharges were seen throughout the recording. Lora Havens   CT HEAD WO CONTRAST  Addendum Date: 02/22/2020   ADDENDUM REPORT: 02/22/2020 10:27 ADDENDUM: These results were called by telephone at the time of interpretation on 02/22/2020 at 10:27 am to provider [facility] , who verbally acknowledged these results. Electronically Signed   By: Kellie Simmering DO   On: 02/22/2020 10:27   Result Date: 02/22/2020 CLINICAL DATA:  Neuro deficit, acute, stroke suspected. Additional history provided: Altered mental status today, SVT, patient combative. EXAM: CT HEAD WITHOUT CONTRAST TECHNIQUE: Contiguous axial images were obtained from the base of the skull through the vertex without intravenous contrast. COMPARISON:  Head CT 02/18/2020. FINDINGS: Brain: Significantly motion degraded examination, limiting evaluation. Stable generalized cerebral atrophy. New from the prior examination of 02/18/2020, there is small volume acute subarachnoid hemorrhage overlying the right parietal and frontal lobes (for instance as seen on series 10, image 10) (series 2, image 9). Redemonstrated chronic cortical/subcortical infarct within the right parietal and posterosuperior right temporal lobes. No acute demarcated cortical infarct is identified. No evidence of intracranial mass. No midline shift. Vascular: No hyperdense vessel. Skull: No fracture or focal suspicious osseous lesion identified. Sinuses/Orbits: Visualized orbits show no acute finding. Mild ethmoid sinus mucosal thickening at the imaged levels. IMPRESSION: Significantly motion degraded examination, limiting evaluation. Small volume acute subarachnoid hemorrhage overlying the right parietal and frontal lobes, new as compared to the head CT of 02/18/2020. Redemonstrated chronic cortical/subcortical infarcts within the right parietal and temporal lobes. Stable generalized cerebral atrophy. Electronically Signed: By: Kellie Simmering DO On:  02/22/2020 10:14        Scheduled Meds: . chlorhexidine gluconate (MEDLINE KIT)  15 mL Mouth Rinse BID  . Chlorhexidine Gluconate Cloth  6 each Topical Daily  . feeding supplement  237 mL Oral TID BM  . influenza vac split quadrivalent PF  0.5 mL Intramuscular Tomorrow-1000  . insulin aspart  0-5 Units Subcutaneous QHS  . insulin aspart  0-6 Units Subcutaneous TID WC  . ipratropium-albuterol  3 mL Nebulization Q6H  . isosorbide mononitrate  30 mg Oral Daily  . losartan  25 mg Oral Daily  . mouth rinse  15 mL Mouth Rinse BID  . metoprolol tartrate  37.5 mg Oral BID  . multivitamin with minerals  1 tablet Oral Daily  . pneumococcal 23 valent vaccine  0.5 mL Intramuscular Tomorrow-1000  . predniSONE  50 mg Oral Q breakfast  . senna-docusate  2 tablet Oral BID   Continuous Infusions: . dextrose 5 % and 0.45% NaCl Stopped (02/21/20 2150)  . famotidine (PEPCID) IV 20 mg (02/22/20 1439)  . levETIRAcetam    . [START ON 02/23/2020] levETIRAcetam       LOS: 8 days    Time spent: 27mns    JKathie Dike MD [facility]   If 7PM-7AM, please contact night-coverage [email or web address]  02/22/2020, 7:51 PM

## 2020-02-22 NOTE — Progress Notes (Signed)
No further jerking of right side noted, pt yelling, "Baby! Hey Baby!" but still no verbal response to questions or commands. ABG has been drawn, awaiting results.  Has purposeful movement of head and left arm and some general movement of right arm but no grip or finger movement. Opens eyes, but closes them when you flick fingers near face. O2 @ 2lpm [place] on at present with SaO2 96%, resp 20/min, non-labored. Dr. Roderic Palau just left room to assess another pt, advises to update if any further changes.

## 2020-02-22 NOTE — Procedures (Signed)
Patient Name: Antonio Ramirez  MRN: [id number]  Epilepsy Attending: Lora Havens  Referring Physician/Provider: Dr Kathie Dike Date: 02/22/2020 Duration: 26.13 mins  Patient history: 59 year old male with chronic right parietal and temporal strokes who presented with altered mental status.  EEG to evaluate for seizures.  Level of alertness: Awake  AEDs during EEG study: None  Technical aspects: This EEG study was done with scalp electrodes positioned according to the 10-20 International system of electrode placement. Electrical activity was acquired at a sampling rate of 500Hz  and reviewed with a high frequency filter of 70Hz  and a low frequency filter of 1Hz . EEG data were recorded continuously and digitally stored.   Description: No clear posterior dominant rhythm was seen.  EEG showed continuous generalized 3 to 6 Hz theta delta slowing.  Hyperventilation and photic stimulation were not performed.     ABNORMALITY -Continuous slow, generalized  IMPRESSION: This study is suggestive of moderate diffuse encephalopathy, nonspecific etiology.  No seizures or epileptiform discharges were seen throughout the recording.  Antonio Ramirez

## 2020-02-22 NOTE — Progress Notes (Signed)
Pt down to radiology via stretcher.

## 2020-02-22 NOTE — Progress Notes (Addendum)
Progress Note  Patient Name: Antonio Ramirez Date of Encounter: 02/22/2020  Primary Cardiologist: Carlyle Dolly, MD   Subjective   Patient not following commands this AM. Will randomly scream "Hey baby" but no purposeful speech. Concerns for possible seizure this AM due to jerking movements along right-side.    Inpatient Medications    Scheduled Meds: . aspirin  81 mg Per Tube Daily  . chlorhexidine gluconate (MEDLINE KIT)  15 mL Mouth Rinse BID  . Chlorhexidine Gluconate Cloth  6 each Topical Daily  . clopidogrel  75 mg Oral Daily  . enoxaparin (LOVENOX) injection  40 mg Subcutaneous Q24H  . feeding supplement  237 mL Oral TID BM  . influenza vac split quadrivalent PF  0.5 mL Intramuscular Tomorrow-1000  . insulin aspart  0-5 Units Subcutaneous QHS  . insulin aspart  0-6 Units Subcutaneous TID WC  . ipratropium-albuterol  3 mL Nebulization Q6H  . isosorbide mononitrate  30 mg Oral Daily  . losartan  25 mg Oral Daily  . mouth rinse  15 mL Mouth Rinse BID  . metoprolol tartrate  50 mg Oral BID  . multivitamin with minerals  1 tablet Oral Daily  . pneumococcal 23 valent vaccine  0.5 mL Intramuscular Tomorrow-1000  . predniSONE  50 mg Oral Q breakfast  . senna-docusate  2 tablet Oral BID   Continuous Infusions: . dextrose 5 % and 0.45% NaCl Stopped (02/21/20 2150)  . famotidine (PEPCID) IV 100 mL/hr at 02/21/20 2200   PRN Meds: acetaminophen **OR** acetaminophen, albuterol, diphenhydrAMINE, LORazepam, ondansetron **OR** ondansetron (ZOFRAN) IV, oxyCODONE   Vital Signs    Vitals:   02/22/20 0529 02/22/20 0819 02/22/20 0833 02/22/20 0848  BP: (!) 151/69  (!) 155/84   Pulse: (!) 59  68 72  Resp: 20  (!) 21 16  Temp: 97.9 F (36.6 C)  (!) 100.5 F (38.1 C)   TempSrc: Oral  Rectal   SpO2: 93% (!) 88% 96% 97%  Weight:      Height:        Intake/Output Summary (Last 24 hours) at 02/22/2020 0906 Last data filed at 02/21/2020 2200 Gross per 24 hour  Intake  579.88 ml  Output 501 ml  Net 78.88 ml    Last 3 Weights 02/19/2020 02/17/2020 02/16/2020  Weight (lbs) 181 lb 14.1 oz 190 lb 0.6 oz 190 lb 4.1 oz  Weight (kg) 82.5 kg 86.2 kg 86.3 kg      Telemetry    Episodes of ventricular bigeminy overnight. Now in sinus rhythm but frequent bursts of narrow-complex tachycardia with HR in 130's to 150's.   - Personally Reviewed  ECG    No new tracings.   Physical Exam   General: Well developed male appearing in no acute distress. Head: Normocephalic, atraumatic.  Neck: Supple without bruits, JVD not elevated. Lungs:  Resp regular and unlabored, decreased breath sounds along bases bilaterally Heart: RRR, S1, S2, no S3, S4, or murmur; no rub. Abdomen: Soft, non-tender, non-distended with normoactive bowel sounds. No hepatomegaly. No rebound/guarding. No obvious abdominal masses. Extremities: No clubbing, cyanosis, or edema. Distal pedal pulses are 2+ bilaterally. Neuro: Unable to follow commands.   Labs    Chemistry Recent Labs  Lab 02/17/20 0601 02/17/20 0601 02/19/20 0439 02/21/20 0418 02/22/20 0432  NA 139   < > 147* 144 139  K 4.1   < > 3.8 3.4* 3.0*  CL 104   < > 105 91* 89*  CO2 27   < >  33* 41* 41*  GLUCOSE 104*   < > 107* 80 103*  BUN 38*   < > 29* 14 15  CREATININE 0.96   < > 0.76 0.53* 0.55*  CALCIUM 9.3   < > 9.9 9.7 9.6  PROT 5.4*  --  6.2* 6.1*  --   ALBUMIN 2.7*  --  3.2* 3.1*  --   AST 841*  --  426* 82*  --   ALT 559*  --  175* 50*  --   ALKPHOS 80  --  89 78  --   BILITOT 1.0  --  1.5* 2.4*  --   GFRNONAA >60   < > >60 >60 >60  ANIONGAP 8   < > '9 12 9   ' < > = values in this interval not displayed.     Hematology Recent Labs  Lab 02/19/20 0439 02/21/20 0418 02/22/20 0432  WBC 13.4* 13.5* 10.3  RBC 6.07* 6.12* 5.99*  HGB 16.7 16.8 16.2  HCT 56.1* 53.7* 52.4*  MCV 92.4 87.7 87.5  MCH 27.5 27.5 27.0  MCHC 29.8* 31.3 30.9  RDW 20.4* 19.5* 19.2*  PLT 167 192 191    Cardiac EnzymesNo results for  input(s): TROPONINI in the last 168 hours. No results for input(s): TROPIPOC in the last 168 hours.   BNP Recent Labs  Lab 02/18/20 0358  BNP 867.0*     DDimer No results for input(s): DDIMER in the last 168 hours.   Radiology    DG Foot 2 Views Left  Result Date: 02/20/2020 CLINICAL DATA:  Left foot pain EXAM: LEFT FOOT - 2 VIEW COMPARISON:  None. FINDINGS: Mild diffuse soft tissue swelling the foot more focal swelling towards the level of the metatarsal heads. The osseous structures appear diffusely demineralized which may limit detection of small or nondisplaced fractures. No soft tissue gas or foreign body. Questionable lucency through the head of the first metatarsal on lateral radiograph, could reflect a nondisplaced fracture. No other acute osseous abnormality is evident. Mild degenerative changes throughout the foot. 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 IMPRESSION: 1. Mild diffuse soft tissue swelling of the foot, more focal swelling towards the level of the metatarsal heads. 2. Questionable lucency through the head of the first metatarsal on lateral radiograph, could reflect a nondisplaced fracture. Correlate for point tenderness. Electronically Signed   By: Lovena Le M.D.   On: 02/20/2020 19:14    Cardiac Studies   Echocardiogram: 02/16/2020 IMPRESSIONS    1. Left ventricular ejection fraction, by estimation, is 45 to 50%. The  left ventricle has mildly decreased function. The left ventricle has no  regional wall motion abnormalities. There is mild left ventricular  hypertrophy. Left ventricular diastolic  parameters are indeterminate.  2. Ventricular septum is flattened in systole consistent with RV pressure  overload. . Right ventricular systolic function is severely reduced. The  right ventricular size is severely enlarged.  3. Right atrial size was moderately dilated.  4. The mitral valve is normal in structure. No evidence of mitral valve    regurgitation. No evidence of mitral stenosis.  5. AV and LVOT Dopplers are off axis and likely underestimated. Visually  there is no significant stenosis or regurgitation. . The aortic valve is  tricuspid. Aortic valve regurgitation is not visualized. No aortic  stenosis is present.   Patient Profile     59 y.o. male w/ PMH of CAD (s/p CABG in 2006 but details unavailable), HLD, COPD, prior CVA  tobacco use and substance use who is currently admitted for acute hypoxic respiratory failure in the setting of a COPD exacerbation and possible opiate overdose. Also found to have staph epidermidis bacteremia. Cardiology consulted for tachy-arrhythmias.   Assessment & Plan    1. Narrow-Complex Tachycardia/Ventriuclar Bigeminy - He continues to have episodes of possible SVT vs atrial flutter this admission. Was initially on IV Lopressor but transitioned to PO Lopressor 31m BID. If mental status improves as this is possibly post-ictal in the setting of seizure-like activity, would continue with PO dosing. If unable to take PO's would transition to IV Lopressor 2.53mQ6H as he continues to have brief episodes of tachycardia with HR into the 130's to 150's at times. He has also been experiencing episodes of ventricular bigeminy overnight with AM labs showing K+ at 3.0 and Mg 1.8. Will order supplementation to keep K+ ~ 4.0 and Mg ~ 2.0. - This patients CHA2DS2-VASc Score and unadjusted Ischemic Stroke Rate (% per year) is equal to 3.2 % stroke rate/year from a score of 3 (Vascular, History of CVA (2)). He has not been started on anticoagulation this admission due to medication noncompliance as an outpatient and due to the arrhythmias occurring in the setting of his acute illness.   2. CAD - He is s/p CABG in 2006 but details unavailable. No reported anginal symptoms this admission. Remains on ASA, Plavix, Imdur and BB. Statin held due to elevated LFT's but would plan to restart at discharge given  improvement in labs.   3. Chronic RV Failure - Prior echo in 05/2019 showed moderate RV dysfunction and repeat echo this admission shows severe RV dysfunction which is felt to be secondary to his underlying lung issues. Was initially receiving IVF for sepsis protocol which have now been discontinued. Not volume overloaded by examination.   4. Elevated LFTs - Improved. AST 82 and ALT 50 when checked on 02/21/2020.  5. Acute Hypoxic Respiratory Failure in the setting of COPD Exacerbation and possible Opiate Overdose - Initially required intubation and sedation. Now on supplemental oxygen but recent ABG showed pCO2 down to 69.1. Repeat values pending. Further management per admitting team.   6. AMS - Not following commands this morning and nursing staff noted jerking motions along his right-side. STAT Head CT ordered by the admitting team and pending. Also being loaded with IV Keppra.    For questions or updates, please contact [facility] consult [email or web address] for contact info under Cardiology/STEMI.   SiArna Medici PA-C 9:06 AM 02/22/2020 Pager: [phone] note Patient seen and discussed with PA StAhmed PrimaI agree with her documentation. Still with runs of tachcyardia, looks like evening dose of lopressor 5058mas held for HR of 50. Looks like the last 3 nights actually has not gotten his evening lopressor dose. Bp's have been fine. Lower lopressor to 37.5mg28md to see if can tolerate bid dosing better, set hold parameters. Keep K at 4, Mg at 2. Agree with no anticoag given compliance issues along with small afib/aflutter burden in setting of systemic illness.    JonaCarlyle Dolly

## 2020-02-22 NOTE — Consult Note (Signed)
Antonio [name], MD     [email or web address]          Antonio Ramirez is an 59 y.o. male.   ASSESSMENT/PLAN: 1.  Acute encephalopathy likely due to multiple seizures/status epilepticus: The patient will be given a loading dose of Keppra 4 mg/kg's.  We will start him on a maintenance dose of 500 mg 3 times a day.  Repeat EEG will be obtained. 2.  Small subretinal hemorrhage right posterior frontal lobe likely nonaneurysmal: Hold aspirin therapy and other antiplatelet agents for 2 weeks.  Restart after 2 weeks with only a single agent.  There seems no indication for dual antiplatelet agents at this time.  Repeat scan has been so for the next day or 2. 3.  Remote right cortical temporal infarct    The patient is 59 year old white male who presents with altered mental status.  He was noted to have acute respiratory failure requiring intubation.  The patient at baseline has a remote infarct but was cognitively fine and was able to move all 4 extremities and communicative.  However, after intubation he was transferred to the floor.  He was noted to have acute confusion and jerking of the right side with the head turned towards the left.  He had this on going for several minutes.  It appears the patient has had recurrent episodes of jerking on the right side with resultant right-sided weakness.  Interestingly his previous infarct involved the right temporal area.  Patient had a emergency CT scan which showed subarachnoid hemorrhage on the right side just above his previous infarct.  He apparently had been on dual antiplatelet agents.  The patient continues to be quite confused and encephalopathic throughout the day.  He remains nonverbal and the review of systems is not possible.    GENERAL: The patient seems mostly nonresponsive.  He is in no acute distress however.  The patient had a couple of episodes where his eyes deviated towards the left.  HEENT: Neck is supple no  trauma noted.  ABDOMEN: soft  EXTREMITIES: There is mild swelling of the left ankle and left foot.  BACK: Normal  SKIN: Normal by inspection.    MENTAL STATUS: He is awake but does not follow commands.  He does have nonsensical speech at times which is often incomprehensible.   CRANIAL NERVES: Pupils are equal, round and reactive to light; extra ocular movements are full, there is no significant nystagmus; visual fields are full; upper and lower facial muscles are normal in strength and symmetric, there is no flattening of the nasolabial folds  MOTOR: He moves the left side spontaneously with antigravity strength especially left upper extremity.  The right side seems somewhat weaker with strength approximately 2/5.  COORDINATION: No clonic activity is noted today.  No tremors or myoclonus or dysmetria.  REFLEXES: Deep tendon reflexes are symmetrical and normal.   SENSATION: He responds to painful stimuli bilaterally.      Blood pressure (!) 155/84, pulse 62, temperature 99.6 F (37.6 C), temperature source Oral, resp. rate 20, height 6' 1" (1.854 m), weight 82.5 kg, SpO2 98 %.  Past Medical History:  Diagnosis Date  . CAD (coronary artery disease)   . COPD (chronic obstructive pulmonary disease) ([place])   . Opioid dependence ([place])   . Smoker     Past Surgical History:  Procedure Laterality Date  . cardiac bypass    . CARDIAC SURGERY    . COLON SURGERY  Family History  Problem Relation Age of Onset  . COPD Mother     Social History:  reports that he has been smoking cigarettes. He has been smoking about 1.00 pack per day. He has never used smokeless tobacco. He reports that he does not drink alcohol and does not use drugs.  Allergies:  Allergies  Allergen Reactions  . Opana [Oxymorphone Hcl] Hives    hiv    Medications: Prior to Admission medications   Medication Sig Start Date End Date Taking? Authorizing Provider  albuterol (VENTOLIN HFA) 108 (90 Base)  MCG/ACT inhaler Inhale 1-2 puffs into the lungs every 6 (six) hours as needed for wheezing or shortness of breath. 06/03/19  Yes Johnson, Clanford L, MD  AMITIZA 24 MCG capsule Take 24 mcg by mouth 2 (two) times daily with a meal. 05/17/19  Yes [provider]  aspirin EC 81 MG tablet Take 1 tablet (81 mg total) by mouth daily. 06/03/19  Yes Johnson, Clanford L, MD  aspirin-acetaminophen-caffeine (EXCEDRIN MIGRAINE) [phone] MG tablet Take 2 tablets by mouth every 6 (six) hours as needed for headache.   Yes [provider]  atorvastatin (LIPITOR) 40 MG tablet Take 1 tablet (40 mg total) by mouth daily at 6 PM. 06/03/19  Yes Johnson, Clanford L, MD  calcitRIOL (ROCALTROL) 0.5 MCG capsule Take 0.5 mcg by mouth daily. 03/16/19  Yes [provider]  citalopram (CELEXA) 10 MG tablet Take 1 tablet (10 mg total) by mouth daily. 07/04/19  Yes Patrecia Pour, MD  clopidogrel (PLAVIX) 75 MG tablet Take 75 mg by mouth daily. 06/27/19  Yes [provider]  gabapentin (NEURONTIN) 600 MG tablet Take 600 mg by mouth at bedtime.  05/27/19  Yes [provider]  isosorbide mononitrate (IMDUR) 30 MG 24 hr tablet Take 30 mg by mouth daily.  01/22/17  Yes [provider]  omeprazole (PRILOSEC) 20 MG capsule Take 20 mg by mouth daily before breakfast. 30 min prior to breakfast. 04/13/19  Yes [provider]  oxyCODONE (ROXICODONE) 15 MG immediate release tablet Take 1 tablet (15 mg total) by mouth 3 (three) times daily as needed for pain. 07/04/19  Yes Patrecia Pour, MD  senna-docusate (SENOKOT-S) 8.6-50 MG tablet Take 2 tablets by mouth at bedtime as needed for mild constipation. 06/03/19  Yes Johnson, Clanford L, MD  SYMBICORT 160-4.5 MCG/ACT inhaler Inhale 2 puffs into the lungs 2 (two) times daily. 04/29/19  Yes [provider]  XTAMPZA ER 36 MG C12A Take 1 capsule (36 mg total) by mouth every 12 (twelve) hours. 07/04/19  Yes Patrecia Pour, MD  polyethylene  glycol (MIRALAX / GLYCOLAX) packet Take 17 g by mouth daily. Patient not taking: Reported on 02/13/2020 02/11/17   Margette Fast, MD    Scheduled Meds: . chlorhexidine gluconate (MEDLINE KIT)  15 mL Mouth Rinse BID  . Chlorhexidine Gluconate Cloth  6 each Topical Daily  . enoxaparin (LOVENOX) injection  40 mg Subcutaneous Q24H  . feeding supplement  237 mL Oral TID BM  . influenza vac split quadrivalent PF  0.5 mL Intramuscular Tomorrow-1000  . insulin aspart  0-5 Units Subcutaneous QHS  . insulin aspart  0-6 Units Subcutaneous TID WC  . ipratropium-albuterol  3 mL Nebulization Q6H  . isosorbide mononitrate  30 mg Oral Daily  . losartan  25 mg Oral Daily  . mouth rinse  15 mL Mouth Rinse BID  . metoprolol tartrate  37.5 mg Oral BID  . multivitamin with  minerals  1 tablet Oral Daily  . pneumococcal 23 valent vaccine  0.5 mL Intramuscular Tomorrow-1000  . predniSONE  50 mg Oral Q breakfast  . senna-docusate  2 tablet Oral BID   Continuous Infusions: . dextrose 5 % and 0.45% NaCl Stopped (02/21/20 2150)  . famotidine (PEPCID) IV 20 mg (02/22/20 1439)   PRN Meds:.acetaminophen **OR** acetaminophen, albuterol, diphenhydrAMINE, LORazepam, ondansetron **OR** ondansetron (ZOFRAN) IV, oxyCODONE     Results for orders placed or performed during the hospital encounter of 02/14/20 (from the past 48 hour(s))  Glucose, capillary     Status: None   Collection Time: 02/20/20  7:55 PM  Result Value Ref Range   Glucose-Capillary 97 70 - 99 mg/dL    Comment: Glucose reference range applies only to samples taken after fasting for at least 8 hours.  Glucose, capillary     Status: Abnormal   Collection Time: 02/20/20 11:02 PM  Result Value Ref Range   Glucose-Capillary 104 (H) 70 - 99 mg/dL    Comment: Glucose reference range applies only to samples taken after fasting for at least 8 hours.  CBC     Status: Abnormal   Collection Time: 02/21/20  4:18 AM  Result Value Ref Range   WBC 13.5 (H)  4.0 - 10.5 K/uL   RBC 6.12 (H) 4.22 - 5.81 MIL/uL   Hemoglobin 16.8 13.0 - 17.0 g/dL   HCT 53.7 (H) 39 - 52 %   MCV 87.7 80.0 - 100.0 fL   MCH 27.5 26.0 - 34.0 pg   MCHC 31.3 30.0 - 36.0 g/dL   RDW 19.5 (H) 11.5 - 15.5 %   Platelets 192 150 - 400 K/uL   nRBC 0.0 0.0 - 0.2 %    Comment: Performed at [facility], [address] [place], [place] [address]  Comprehensive metabolic panel     Status: Abnormal   Collection Time: 02/21/20  4:18 AM  Result Value Ref Range   Sodium 144 135 - 145 mmol/L   Potassium 3.4 (L) 3.5 - 5.1 mmol/L   Chloride 91 (L) 98 - 111 mmol/L   CO2 41 (H) 22 - 32 mmol/L   Glucose, Bld 80 70 - 99 mg/dL    Comment: Glucose reference range applies only to samples taken after fasting for at least 8 hours.   BUN 14 6 - 20 mg/dL   Creatinine, Ser 0.53 (L) 0.61 - 1.24 mg/dL   Calcium 9.7 8.9 - 10.3 mg/dL   Total Protein 6.1 (L) 6.5 - 8.1 g/dL   Albumin 3.1 (L) 3.5 - 5.0 g/dL   AST 82 (H) 15 - 41 U/L   ALT 50 (H) 0 - 44 U/L   Alkaline Phosphatase 78 38 - 126 U/L   Total Bilirubin 2.4 (H) 0.3 - 1.2 mg/dL   GFR, Estimated >60 >60 mL/min    Comment: (NOTE) Calculated using the CKD-EPI Creatinine Equation (2021)    Anion gap 12 5 - 15    Comment: Performed at [facility], [address] [place], [place] [address]  Glucose, capillary     Status: None   Collection Time: 02/21/20  8:20 AM  Result Value Ref Range   Glucose-Capillary 99 70 - 99 mg/dL    Comment: Glucose reference range applies only to samples taken after fasting for at least 8 hours.  Glucose, capillary     Status: Abnormal   Collection Time: 02/21/20 11:29 AM  Result Value Ref Range   Glucose-Capillary 101 (H) 70 -  99 mg/dL    Comment: Glucose reference range applies only to samples taken after fasting for at least 8 hours.  Glucose, capillary     Status: Abnormal   Collection Time: 02/21/20  3:39 PM  Result Value Ref Range   Glucose-Capillary 105 (H) 70 - 99 mg/dL    Comment: Glucose reference  range applies only to samples taken after fasting for at least 8 hours.  Glucose, capillary     Status: Abnormal   Collection Time: 02/21/20  7:27 PM  Result Value Ref Range   Glucose-Capillary 128 (H) 70 - 99 mg/dL    Comment: Glucose reference range applies only to samples taken after fasting for at least 8 hours.  CBC     Status: Abnormal   Collection Time: 02/22/20  4:32 AM  Result Value Ref Range   WBC 10.3 4.0 - 10.5 K/uL   RBC 5.99 (H) 4.22 - 5.81 MIL/uL   Hemoglobin 16.2 13.0 - 17.0 g/dL   HCT 52.4 (H) 39 - 52 %   MCV 87.5 80.0 - 100.0 fL   MCH 27.0 26.0 - 34.0 pg   MCHC 30.9 30.0 - 36.0 g/dL   RDW 19.2 (H) 11.5 - 15.5 %   Platelets 191 150 - 400 K/uL   nRBC 0.0 0.0 - 0.2 %    Comment: Performed at [facility], [address], [place], [place] [address]  Basic metabolic panel     Status: Abnormal   Collection Time: 02/22/20  4:32 AM  Result Value Ref Range   Sodium 139 135 - 145 mmol/L   Potassium 3.0 (L) 3.5 - 5.1 mmol/L   Chloride 89 (L) 98 - 111 mmol/L   CO2 41 (H) 22 - 32 mmol/L   Glucose, Bld 103 (H) 70 - 99 mg/dL    Comment: Glucose reference range applies only to samples taken after fasting for at least 8 hours.   BUN 15 6 - 20 mg/dL   Creatinine, Ser 0.55 (L) 0.61 - 1.24 mg/dL   Calcium 9.6 8.9 - 10.3 mg/dL   GFR, Estimated >60 >60 mL/min    Comment: (NOTE) Calculated using the CKD-EPI Creatinine Equation (2021)    Anion gap 9 5 - 15    Comment: Performed at [facility], [address], [place], [place] [address]  Magnesium     Status: None   Collection Time: 02/22/20  4:32 AM  Result Value Ref Range   Magnesium 1.8 1.7 - 2.4 mg/dL    Comment: Performed at [facility], [address], [place], [place] [address]  Glucose, capillary     Status: Abnormal   Collection Time: 02/22/20  8:03 AM  Result Value Ref Range   Glucose-Capillary 100 (H) 70 - 99 mg/dL    Comment: Glucose reference range applies only to samples taken after fasting for at least 8 hours.    Blood gas, arterial     Status: Abnormal   Collection Time: 02/22/20  8:45 AM  Result Value Ref Range   FIO2 28.00    pH, Arterial 7.456 (H) 7.35 - 7.45   pCO2 arterial 69.1 (HH) 32 - 48 mmHg    Comment: CRITICAL RESULT CALLED TO, READ BACK BY AND VERIFIED WITH: BENGSTON,P AT 9:05AM ON 02/22/20 BY FESTERMAN,C    pO2, Arterial 68.3 (L) 83 - 108 mmHg   Bicarbonate 43.1 (H) 20.0 - 28.0 mmol/L   Acid-Base Excess 21.8 (H) 0.0 - 2.0 mmol/L   O2 Saturation 92.3 %   Patient  temperature 38.0    Allens test (pass/fail) PASS PASS    Comment: Performed at [facility], [address] [place], [place] [address]  Glucose, capillary     Status: Abnormal   Collection Time: 02/22/20 11:55 AM  Result Value Ref Range   Glucose-Capillary 116 (H) 70 - 99 mg/dL    Comment: Glucose reference range applies only to samples taken after fasting for at least 8 hours.    Studies/Results:  EEG Technical aspects: This EEG study was done with scalp electrodes positioned according to the 10-20 International system of electrode placement. Electrical activity was acquired at a sampling rate of 500Hz and reviewed with a high frequency filter of 70Hz and a low frequency filter of 1Hz. EEG data were recorded continuously and digitally stored.   Description: No clear posterior dominant rhythm was seen.  EEG showed continuous generalized 3 to 6 Hz theta delta slowing.  Hyperventilation and photic stimulation were not performed.     ABNORMALITY -Continuous slow, generalized  IMPRESSION: This study is suggestive of moderate diffuse encephalopathy, nonspecific etiology.  No seizures or epileptiform discharges were seen throughout the recording.     HEAD CT FINDINGS: Brain:  Significantly motion degraded examination, limiting evaluation.  Stable generalized cerebral atrophy.  New from the prior examination of 02/18/2020, there is small volume acute subarachnoid hemorrhage overlying the right parietal  and frontal lobes (for instance as seen on series 10, image 10) (series 2, image 9).  Redemonstrated chronic cortical/subcortical infarct within the right parietal and posterosuperior right temporal lobes.  No acute demarcated cortical infarct is identified.  No evidence of intracranial mass.  No midline shift.  Vascular: No hyperdense vessel.  Skull: No fracture or focal suspicious osseous lesion identified.  Sinuses/Orbits: Visualized orbits show no acute finding. Mild ethmoid sinus mucosal thickening at the imaged levels.  IMPRESSION: Significantly motion degraded examination, limiting evaluation.  Small volume acute subarachnoid hemorrhage overlying the right parietal and frontal lobes, new as compared to the head CT of 02/18/2020.  Redemonstrated chronic cortical/subcortical infarcts within the right parietal and temporal lobes.  Stable generalized cerebral atrophy.    The head CT scan is reviewed in person. There is small subarachnoid hemorrhage noted in the posterior right frontal area seen in 2 3 clots. This is new with hyperdensity. There is also a right temporal encephalomalacia consistent with remote cortical infarct.     Brittanie Dosanjh A. Merlene Ramirez, M.D.  Diplomate, Tax adviser of Psychiatry and Neurology ( Neurology). 02/22/2020, 4:45 PM

## 2020-02-22 NOTE — Progress Notes (Signed)
Pt sleeping at present. Resp even and nonlabored, SaO2 98% on 4 lpm [place]. Occasional congested cough noted. Pt's POA Lattie Haw has left for the day, advised would call her if any other changes in pt condition and that MD Memon would call her this afternoon with update and to answer questions.

## 2020-02-22 NOTE — TOC Progression Note (Addendum)
Transition of Care [facility]) - Progression Note    Patient Details  Name: Antonio Ramirez MRN: [id number] Date of Birth: [birth year]  Transition of Care [facility]) CM/SW [place], [place] Phone Number: 02/22/2020, 5:20 PM  Clinical Narrative:    CSW spoke with Dorena Cookey ([address]) [phone] to inform her or bed offers from [facility] and [place]. Ms. Berdine Addison states that she would like to go with [facility]. CSW updated NAVI that pt is planning to d/c to [facility] when medically stable which may be a few days. NAVI states that Josem Kaufmann has been approved for 3 days and will end on Friday. Pts auth# is [id number]. CSW updated that pt still has a few days in hospital, NAVI states that if pt can d/c on Saturday 11/13 that new only new clinicals will need to be sent over. If pt d/c after Saturday new auth will need to be started. TOC to follow.    Expected Discharge Plan: [place] Barriers to Discharge: Continued Medical Work up  Expected Discharge Plan and Services Expected Discharge Plan: [place] In-house Referral: Clinical Social Work   Post Acute Care Choice: [place] Living arrangements for the past 2 months: [place] Determinants of Health (SDOH) Interventions    Readmission Risk Interventions Readmission Risk Prevention Plan 02/21/2020 02/16/2020  Transportation Screening Complete Complete  Social Work Consult for [place] Planning/Counseling Complete -  [place] Screening Not Applicable Not Applicable  Medication Review Press photographer) Complete Complete  Some recent data might be hidden

## 2020-02-22 NOTE — Progress Notes (Signed)
CRITICAL VALUE ALERT  Critical Value:  69.1  Date & Time Notied: 02/22/2020 @ 7276  Provider Notified: Memon  Orders Received/Actions taken: No new orders

## 2020-02-22 NOTE — Progress Notes (Signed)
EEG completed. Pt remains awake but confused/disoriented. Does not follow commands, does not answer questions, just continuously moving/restless and saying, "Hey Baby!" over and over. SaO2 noted to be 87-88% on 2lpm [place], MD Memon notified and order received to increase O2 to maintain SaO2 88-92%. O2 increased to 4lpm [place]. Current SaO2 90%. Resp 16/min, nonlabored.

## 2020-02-22 NOTE — Progress Notes (Signed)
Pt's POA Lattie Haw here to see pt. Updated on current condition and reviewed events of this am. Pt continues to yell, "Hey Baby!", but does not recognize Lattie Haw or even acknowledge that any other people are in the room. Lattie Haw states this is a complete change from pt behavior last pm when she last saw him. Pt is restless, fidgeting with bed linens and gown but not pulling at equipment. MD Memon notified that POA here and wishes to speak with him.

## 2020-02-22 NOTE — Progress Notes (Signed)
EEG complete - results pending 

## 2020-02-23 ENCOUNTER — Inpatient Hospital Stay (HOSPITAL_COMMUNITY)
Admit: 2020-02-23 | Discharge: 2020-02-23 | Disposition: A | Discharge status: Home / Self Care | Payer: Medicare Other | Attending: Neurology | Admitting: Neurology

## 2020-02-23 ENCOUNTER — Inpatient Hospital Stay (HOSPITAL_COMMUNITY): Payer: Medicare Other

## 2020-02-23 DIAGNOSIS — I471 Supraventricular tachycardia: Secondary | ICD-10-CM | POA: Diagnosis not present

## 2020-02-23 DIAGNOSIS — F1129 Opioid dependence with unspecified opioid-induced disorder: Secondary | ICD-10-CM | POA: Diagnosis not present

## 2020-02-23 DIAGNOSIS — J9602 Acute respiratory failure with hypercapnia: Secondary | ICD-10-CM | POA: Diagnosis not present

## 2020-02-23 DIAGNOSIS — J441 Chronic obstructive pulmonary disease with (acute) exacerbation: Secondary | ICD-10-CM | POA: Diagnosis not present

## 2020-02-23 LAB — COMPREHENSIVE METABOLIC PANEL
ALT: 47 U/L — ABNORMAL HIGH (ref 0–44)
AST: 48 U/L — ABNORMAL HIGH (ref 15–41)
Albumin: 3 g/dL — ABNORMAL LOW (ref 3.5–5.0)
Alkaline Phosphatase: 66 U/L (ref 38–126)
Anion gap: 10 (ref 5–15)
BUN: 16 mg/dL (ref 6–20)
CO2: 41 mmol/L — ABNORMAL HIGH (ref 22–32)
Calcium: 9.6 mg/dL (ref 8.9–10.3)
Chloride: 91 mmol/L — ABNORMAL LOW (ref 98–111)
Creatinine, Ser: 0.63 mg/dL (ref 0.61–1.24)
GFR, Estimated: >60 mL/min (ref >60)
Glucose, Bld: 95 mg/dL (ref 70–99)
Potassium: 2.4 mmol/L — CL (ref 3.5–5.1)
Sodium: 142 mmol/L (ref 135–145)
Total Bilirubin: 2.3 mg/dL — ABNORMAL HIGH (ref 0.3–1.2)
Total Protein: 5.8 g/dL — ABNORMAL LOW (ref 6.5–8.1)

## 2020-02-23 LAB — CBC
HCT: 50.8 % (ref 39.0–52.0)
Hemoglobin: 16 g/dL (ref 13.0–17.0)
MCH: 27.4 pg (ref 26.0–34.0)
MCHC: 31.5 g/dL (ref 30.0–36.0)
MCV: 87 fL (ref 80.0–100.0)
Platelets: 197 10*3/uL (ref 150–400)
RBC: 5.84 MIL/uL — ABNORMAL HIGH (ref 4.22–5.81)
RDW: 19.9 % — ABNORMAL HIGH (ref 11.5–15.5)
WBC: 11.2 10*3/uL — ABNORMAL HIGH (ref 4.0–10.5)
nRBC: 0 % (ref 0.0–0.2)

## 2020-02-23 LAB — GLUCOSE, CAPILLARY
Glucose-Capillary: 81 mg/dL (ref 70–99)
Glucose-Capillary: 84 mg/dL (ref 70–99)
Glucose-Capillary: 80 mg/dL (ref 70–99)

## 2020-02-23 LAB — MAGNESIUM: Magnesium: 1.9 mg/dL (ref 1.7–2.4)

## 2020-02-23 MED ORDER — POTASSIUM CHLORIDE 20 MEQ PO PACK
40.0000 meq | PACK | Freq: Once | ORAL | Status: DC
  Filled 2020-02-23: qty 2

## 2020-02-23 MED ORDER — POTASSIUM CHLORIDE 10 MEQ/100ML IV SOLN
10.0000 meq | INTRAVENOUS | Status: AC
  Administered 2020-02-23 (×4): 10 meq via INTRAVENOUS
  Filled 2020-02-23 (×4): qty 100

## 2020-02-23 MED ORDER — POTASSIUM CHLORIDE 10 MEQ/100ML IV SOLN
10.0000 meq | INTRAVENOUS | Status: AC
  Administered 2020-02-23 – 2020-02-24 (×4): 10 meq via INTRAVENOUS
  Filled 2020-02-23 (×4): qty 100

## 2020-02-23 NOTE — Progress Notes (Signed)
EEG complete - results pending 

## 2020-02-23 NOTE — Progress Notes (Signed)
MD notified of critical lab value potassium level 2.4.

## 2020-02-23 NOTE — Progress Notes (Signed)
PROGRESS NOTE    Antonio Ramirez  BSJ:[id number] DOB: [birth year] DOA: 02/14/2020 PCP: [name], DO    Brief Narrative:  59 year old male with a history of COPD, coronary artery disease, opiate dependence, presents with acute respiratory failure requiring intubation and mechanical ventilation.  Patient was found to have COPD exacerbation with elevated PCO2.  He was intubated on 11/2 extubated on 11/5.  Hospital course further complicated by seizures and change in mental status.  CT head showed small subarachnoid hemorrhage.  Neurology following and has been started on Keppra.   Assessment & Plan:   Active Problems:   Acute respiratory failure with hypoxia (HCC)   COPD with acute exacerbation (HCC)   Opioid dependence ([place])   Depression   CAD (coronary artery disease)   Endotracheally intubated   Altered mental status   Seizure ([place])   Acute encephalopathy.   -Patient noted to be lethargic -Concerns for underlying seizure -Respiratory acidosis appears to be compensated -Mental status has not returned to baseline yet -CT head shows small subarachnoid hemorrhage which was not felt to be because of his mental status change -Continue to monitor  Seizure with concerns for status epilepticus -Appreciate neurology assistance -EEG did not show any epileptiform discharges -He has been started on loading dose of Keppra as well as maintenance dosing -Repeat EEG done today did not show any epileptiform discharges  Small subarachnoid hemorrhage -CT head reviewed with neurosurgery, Dr. Marcello Moores -It was not felt that surgical intervention was indicated at this time -Recommendations were to hold antiplatelet agents -Repeat CT head performed today showed that hemorrhage was stable  Acute hypoxic and hypercapnic respiratory failure -Felt to be related to COPD as well as opiate use -Followed by pulmonology -Intubated from 11/2-11/5 -Overall blood gas has improved -Respiratory  acidosis is now compensated -Continue BiPAP nightly  Staph epi bacteremia -Transthoracic echocardiogram without evidence of endocarditis -Completed Ancef on 11/7  COPD exacerbation -Treated with steroids, bronchodilators -Overall respiratory status appears to be stable -Followed by pulmonology  Acute kidney injury -Baseline creatinine 1.5 -On admission, creatinine noted to be elevated at 1.8 -AKI has resolved with hydration  Chronic systolic congestive heart failure, EF of 45 to 50% -Volume status appears to be stable -Continue Imdur, losartan and metoprolol -Cardiology following  Tachyarrhythmias -Noted to have transient SVT versus A. fib with RVR -Cardiology did not recommend anticoagulation due to poor compliance and low A. fib burden  Generalized weakness -Seen by physical therapy with recommendation for skilled nursing facility placement  DVT prophylaxis: Place and maintain sequential compression device Start: 02/20/20 1656 Place TED hose Start: 02/20/20 1656  Code Status: Full code Family Communication: Updated patient's POA Lisa over phone 11/10 Disposition Plan: Status is: Inpatient  Remains inpatient appropriate because:Inpatient level of care appropriate due to severity of illness   Dispo: The patient is from: Home              Anticipated d/c is to: SNF              Anticipated d/c date is: 3 days              Patient currently is not medically stable to d/c.   Consultants:   Pulmonology  Neurology  Procedures:   ETT 11/2-11/5  Antimicrobials:      Subjective: Patient is sitting up in bed, he does not make eye contact, follow commands or answer questions.  He is awake and is mumbling  Objective: Vitals:   02/23/20 0837  02/23/20 1311 02/23/20 1904 02/23/20 2001  BP:   (!) 168/85   Pulse:   (!) 53   Resp:   20   Temp: 99 F (37.2 C)  97.8 F (36.6 C)   TempSrc: Axillary  Oral   SpO2:  93% 95% 97%  Weight:      Height:         Intake/Output Summary (Last 24 hours) at 02/23/2020 2048 Last data filed at 02/23/2020 1840 Gross per 24 hour  Intake 42.68 ml  Output 2450 ml  Net -2407.32 ml   Filed Weights   02/16/20 0343 02/17/20 0630 02/19/20 0500  Weight: 86.3 kg 86.2 kg 82.5 kg    Examination:  General exam: Confused, no distress Respiratory system: Clear to auscultation. Respiratory effort normal. Cardiovascular system:RRR. No murmurs, rubs, gallops. Gastrointestinal system: Abdomen is nondistended, soft and nontender. No organomegaly or masses felt. Normal bowel sounds heard. Central nervous system: He does not follow commands, although he is moving his upper and lower extremities spontaneously Extremities: No C/C/E, +pedal pulses Skin: No rashes, lesions or ulcers Psychiatry: Confused, does not answer questions   Data Reviewed: I have personally reviewed following labs and imaging studies  CBC: Recent Labs  Lab 02/17/20 0601 02/19/20 0439 02/21/20 0418 02/22/20 0432 02/23/20 0408  WBC 17.7* 13.4* 13.5* 10.3 11.2*  HGB 16.7 16.7 16.8 16.2 16.0  HCT 51.2 56.1* 53.7* 52.4* 50.8  MCV 87.1 92.4 87.7 87.5 87.0  PLT 240 167 192 191 325   Basic Metabolic Panel: Recent Labs  Lab 02/17/20 0601 02/19/20 0439 02/21/20 0418 02/22/20 0432 02/23/20 0408  NA 139 147* 144 139 142  K 4.1 3.8 3.4* 3.0* 2.4*  CL 104 105 91* 89* 91*  CO2 27 33* 41* 41* 41*  GLUCOSE 104* 107* 80 103* 95  BUN 38* 29* _0 CREATININE 0.96 0.76 0.53* 0.55* 0.63  CALCIUM 9.3 9.9 9.7 9.6 9.6  MG  --   --   --  1.8 1.9   GFR: Estimated Creatinine Clearance: 111 mL/min (by C-G formula based on SCr of 0.63 mg/dL). Liver Function Tests: Recent Labs  Lab 02/17/20 0601 02/19/20 0439 02/21/20 0418 02/23/20 0408  AST 841* 426* 82* 48*  ALT 559* 175* 50* 47*  ALKPHOS 80 89 78 66  BILITOT 1.0 1.5* 2.4* 2.3*  PROT 5.4* 6.2* 6.1* 5.8*  ALBUMIN 2.7* 3.2* 3.1* 3.0*   No results for input(s): LIPASE, AMYLASE in  the last 168 hours. Recent Labs  Lab 02/19/20 0735  AMMONIA 26   Coagulation Profile: No results for input(s): INR, PROTIME in the last 168 hours. Cardiac Enzymes: No results for input(s): CKTOTAL, CKMB, CKMBINDEX, TROPONINI in the last 168 hours. BNP (last 3 results) No results for input(s): PROBNP in the last 8760 hours. HbA1C: No results for input(s): HGBA1C in the last 72 hours. CBG: Recent Labs  Lab 02/22/20 1647 02/22/20 2148 02/23/20 1202 02/23/20 1626 02/23/20 2042  GLUCAP 91 99 81 84 80   Lipid Profile: No results for input(s): CHOL, HDL, LDLCALC, TRIG, CHOLHDL, LDLDIRECT in the last 72 hours. Thyroid Function Tests: No results for input(s): TSH, T4TOTAL, FREET4, T3FREE, THYROIDAB in the last 72 hours. Anemia Panel: No results for input(s): VITAMINB12, FOLATE, FERRITIN, TIBC, IRON, RETICCTPCT in the last 72 hours. Sepsis Labs: No results for input(s): PROCALCITON, LATICACIDVEN in the last 168 hours.  Recent Results (from the past 240 hour(s))  Culture, Urine     Status: None   Collection Time: 02/14/20  9:26 PM   Specimen: Urine, Clean Catch  Result Value Ref Range Status   Specimen Description   Final    URINE, CLEAN CATCH Performed at [facility], [address] [place], [place] [address]    Special Requests   Final    NONE Performed at [facility], [address] [place], [place] [address]    Culture   Final    NO GROWTH Performed at [facility], [place] [address], [place], [place] [address]    Report Status 02/16/2020 FINAL  Final  Culture, blood (routine x 2)     Status: None   Collection Time: 02/14/20  9:44 PM   Specimen: BLOOD RIGHT HAND  Result Value Ref Range Status   Specimen Description BLOOD RIGHT HAND  Final   Special Requests   Final    BOTTLES DRAWN AEROBIC ONLY Blood Culture adequate volume   Culture   Final    NO GROWTH 5 DAYS Performed at [facility], [address], [place], [place] [address]    Report Status 02/19/2020  FINAL  Final  Culture, blood (routine x 2)     Status: None   Collection Time: 02/14/20  9:58 PM   Specimen: BLOOD LEFT HAND  Result Value Ref Range Status   Specimen Description BLOOD LEFT HAND  Final   Special Requests   Final    BOTTLES DRAWN AEROBIC ONLY Blood Culture adequate volume   Culture   Final    NO GROWTH 5 DAYS Performed at [facility], [address], [place], [place] [address]    Report Status 02/19/2020 FINAL  Final  MRSA PCR Screening     Status: None   Collection Time: 02/15/20 12:05 AM   Specimen: Nasal Mucosa; Nasopharyngeal  Result Value Ref Range Status   MRSA by PCR NEGATIVE NEGATIVE Final    Comment:        The GeneXpert MRSA Assay (FDA approved for NASAL specimens only), is one component of a comprehensive MRSA colonization surveillance program. It is not intended to diagnose MRSA infection nor to guide or monitor treatment for MRSA infections. Performed at [facility], [address] [place], [place] [address]   Culture, blood (Routine X 2) w Reflex to ID Panel     Status: None   Collection Time: 02/16/20  2:33 PM   Specimen: BLOOD RIGHT HAND  Result Value Ref Range Status   Specimen Description BLOOD RIGHT HAND  Final   Special Requests   Final    BOTTLES DRAWN AEROBIC AND ANAEROBIC Blood Culture adequate volume   Culture   Final    NO GROWTH 5 DAYS Performed at [facility], [address], [place], [place] [address]    Report Status 02/21/2020 FINAL  Final  Culture, blood (Routine X 2) w Reflex to ID Panel     Status: None   Collection Time: 02/16/20  2:33 PM   Specimen: BLOOD LEFT HAND  Result Value Ref Range Status   Specimen Description BLOOD LEFT HAND  Final   Special Requests   Final    BOTTLES DRAWN AEROBIC AND ANAEROBIC Blood Culture results may not be optimal due to an inadequate volume of blood received in culture bottles   Culture   Final    NO GROWTH 5 DAYS Performed at [facility], [address] [place], [place]  [address]    Report Status 02/21/2020 FINAL  Final         Radiology Studies: EEG  Result Date: 02/23/2020 Phillips Odor, MD     02/23/2020  6:25 PM [place] A. [name], MD     [email or web address]       HISTORY: The patient presents with recurrent seizures. MEDICATIONS: Current Facility-Administered Medications: .  acetaminophen (TYLENOL) tablet 650 mg, 650 mg, Oral, Q6H PRN **OR** acetaminophen (TYLENOL) suppository 650 mg, 650 mg, Rectal, Q6H PRN, Emokpae, Ejiroghene E, MD, 650 mg at 02/23/20 0443 .  albuterol (PROVENTIL) (2.5 MG/3ML) 0.083% nebulizer solution 2.5 mg, 2.5 mg, Nebulization, Q4H PRN, Emokpae, Courage, MD .  chlorhexidine gluconate (MEDLINE KIT) (PERIDEX) 0.12 % solution 15 mL, 15 mL, Mouth Rinse, BID, Emokpae, Ejiroghene E, MD, 15 mL at 02/23/20 1111 .  Chlorhexidine Gluconate Cloth 2 % PADS 6 each, 6 each, Topical, Daily, Anders Simmonds, MD, 6 each at 02/23/20 1112 .  dextrose 5 %-0.45 % sodium chloride infusion, , Intravenous, Continuous, Emokpae, Courage, MD, Last Rate: 20 mL/hr at 02/23/20 1250, New Bag at 02/23/20 1250 .  diphenhydrAMINE (BENADRYL) injection 25 mg, 25 mg, Intravenous, Q6H PRN, Emokpae, Courage, MD .  famotidine (PEPCID) IVPB 20 mg premix, 20 mg, Intravenous, Q12H, Emokpae, Ejiroghene E, MD, Last Rate: 100 mL/hr at 02/23/20 1112, 20 mg at 02/23/20 1112 .  feeding supplement (ENSURE ENLIVE / ENSURE PLUS) liquid 237 mL, 237 mL, Oral, TID BM, Emokpae, Courage, MD, 237 mL at 02/21/20 2147 .  influenza vac split quadrivalent PF (FLUARIX) injection 0.5 mL, 0.5 mL, Intramuscular, Tomorrow-1000, Emokpae, Ejiroghene E, MD .  insulin aspart (novoLOG) injection 0-5 Units, 0-5 Units, Subcutaneous, QHS, Emokpae, Courage, MD .  insulin aspart (novoLOG) injection 0-6 Units, 0-6 Units, Subcutaneous, TID WC, Emokpae, Courage, MD .  ipratropium-albuterol (DUONEB) 0.5-2.5 (3) MG/3ML nebulizer solution 3 mL, 3 mL, Nebulization, Q6H, Emokpae, Courage, MD, 3 mL at  02/23/20 1311 .  isosorbide mononitrate (IMDUR) 24 hr tablet 30 mg, 30 mg, Oral, Daily, Emokpae, Courage, MD .  levETIRAcetam (KEPPRA) IVPB 500 mg/100 mL premix, 500 mg, Intravenous, Q8H, [place], Kofi, MD, Last Rate: 400 mL/hr at 02/23/20 1117, 500 mg at 02/23/20 1117 .  LORazepam (ATIVAN) injection 0.5 mg, 0.5 mg, Intravenous, Q8H PRN, Emokpae, Courage, MD, 0.5 mg at 02/23/20 0114 .  losartan (COZAAR) tablet 25 mg, 25 mg, Oral, Daily, Satira Sark, MD, 25 mg at 02/21/20 0906 .  MEDLINE mouth rinse, 15 mL, Mouth Rinse, BID, Emokpae, Courage, MD, 15 mL at 02/23/20 1115 .  metoprolol tartrate (LOPRESSOR) tablet 37.5 mg, 37.5 mg, Oral, BID, [place], Alphonse Guild, MD .  multivitamin with minerals tablet 1 tablet, 1 tablet, Oral, Daily, Emokpae, Courage, MD, 1 tablet at 02/21/20 1700 .  ondansetron (ZOFRAN) tablet 4 mg, 4 mg, Oral, Q6H PRN **OR** ondansetron (ZOFRAN) injection 4 mg, 4 mg, Intravenous, Q6H PRN, Emokpae, Ejiroghene E, MD .  oxyCODONE (Oxy IR/ROXICODONE) immediate release tablet 10 mg, 10 mg, Oral, Q6H PRN, Emokpae, Courage, MD .  pneumococcal 23 valent vaccine (PNEUMOVAX-23) injection 0.5 mL, 0.5 mL, Intramuscular, Tomorrow-1000, Emokpae, Ejiroghene E, MD .  potassium chloride (KLOR-CON) packet 40 mEq, 40 mEq, Oral, Once, Zierle-Ghosh, Asia B, DO .  senna-docusate (Senokot-S) tablet 2 tablet, 2 tablet, Oral, BID, Emokpae, Courage, MD ANALYSIS: A 16 channel recording using standard 10 20 measurements is conducted for 26 minutes.  There is significant myogenic artifact seen throughout the recording.  The background activity gets as high as 6 to 7 Hz.  Photic stimulation and hyperventilation are not conducted.  There is no focal or lateral slowing.  There is no  epileptiform activity is noted. IMPRESSION: 1.  The recording shows a mild to moderate global slowing indicating a mild to moderate global encephalopathy.  No epileptiform activities are noted. Kofi A. [name], M.D. Diplomate, Tax adviser of  Psychiatry and Neurology ( Neurology).   EEG  Result Date: 02/22/2020 Lora Havens, MD     02/22/2020 12:34 PM Patient Name: Antonio Ramirez MRN: [id number] Epilepsy Attending: Lora Havens Referring Physician/Provider: Dr Kathie Dike Date: 02/22/2020 Duration: 26.13 mins Patient history: 59 year old male with chronic right parietal and temporal strokes who presented with altered mental status.  EEG to evaluate for seizures. Level of alertness: Awake AEDs during EEG study: None Technical aspects: This EEG study was done with scalp electrodes positioned according to the 10-20 International system of electrode placement. Electrical activity was acquired at a sampling rate of _0  and reviewed with a high frequency filter of _1  and a low frequency filter of _2 . EEG data were recorded continuously and digitally stored. Description: No clear posterior dominant rhythm was seen.  EEG showed continuous generalized 3 to 6 Hz theta delta slowing.  Hyperventilation and photic stimulation were not performed.   ABNORMALITY -Continuous slow, generalized IMPRESSION: This study is suggestive of moderate diffuse encephalopathy, nonspecific etiology. No seizures or epileptiform discharges were seen throughout the recording. Priyanka [name]   CT HEAD WO CONTRAST  Result Date: 02/23/2020 CLINICAL DATA:  Recent intracranial hemorrhage EXAM: CT HEAD WITHOUT CONTRAST TECHNIQUE: Contiguous axial images were obtained from the base of the skull through the vertex without intravenous contrast. COMPARISON:  February 22, 2020 FINDINGS: Brain: There is essentially stable hemorrhage in the right parietal lobe region. No new foci of hemorrhage are evident. There is no mass, extra-axial fluid collection, or midline shift. Mild to moderate underlying atrophy is stable. There is evidence of a prior infarct involving portions of the superior, posterior right temporal and right temporal-parietal-occipital junction region.  Suspect acute component to this infarct along its more superomedial aspect. Appearance stable in this area. Elsewhere there is mild small vessel disease in the periventricular white matter, stable. No new focus of infarction evident. Vascular: No appreciable hyperdense vessel. There is calcification in the right carotid siphon region. Skull: Bony calvarium appears intact. Sinuses/Orbits: Small retention cyst noted in the anterior left sphenoid sinus. There is mild mucosal thickening in several ethmoid air cells. Visualized orbits appear symmetric bilaterally. Other: Mastoid air cells are clear. IMPRESSION: 1. Stable subarachnoid hemorrhage in the right anterior mid parietal lobe regions. No new focus or progression of hemorrhage. 2. Stable infarct involving portions of the right temporal lobe as well as the right parietal-occipital junction with suspected more acute component along the superomedial aspect of this infarct, stable. No new infarct evident. 3.  Stable atrophy with mild periventricular small vessel disease. 4.  Foci of arterial vascular calcification. 5.  Mild paranasal sinus disease. Electronically Signed   By: Lowella Grip III M.D.   On: 02/23/2020 11:35   CT HEAD WO CONTRAST  Addendum Date: 02/22/2020   ADDENDUM REPORT: 02/22/2020 10:27 ADDENDUM: These results were called by telephone at the time of interpretation on 02/22/2020 at 10:27 am to provider [facility] , who verbally acknowledged these results. Electronically Signed   By: Kellie Simmering DO   On: 02/22/2020 10:27   Result Date: 02/22/2020 CLINICAL DATA:  Neuro deficit, acute, stroke suspected. Additional history provided: Altered mental status today, SVT, patient combative. EXAM: CT HEAD WITHOUT CONTRAST TECHNIQUE: Contiguous axial images were obtained from the  base of the skull through the vertex without intravenous contrast. COMPARISON:  Head CT 02/18/2020. FINDINGS: Brain: Significantly motion degraded examination, limiting  evaluation. Stable generalized cerebral atrophy. New from the prior examination of 02/18/2020, there is small volume acute subarachnoid hemorrhage overlying the right parietal and frontal lobes (for instance as seen on series 10, image 10) (series 2, image 9). Redemonstrated chronic cortical/subcortical infarct within the right parietal and posterosuperior right temporal lobes. No acute demarcated cortical infarct is identified. No evidence of intracranial mass. No midline shift. Vascular: No hyperdense vessel. Skull: No fracture or focal suspicious osseous lesion identified. Sinuses/Orbits: Visualized orbits show no acute finding. Mild ethmoid sinus mucosal thickening at the imaged levels. IMPRESSION: Significantly motion degraded examination, limiting evaluation. Small volume acute subarachnoid hemorrhage overlying the right parietal and frontal lobes, new as compared to the head CT of 02/18/2020. Redemonstrated chronic cortical/subcortical infarcts within the right parietal and temporal lobes. Stable generalized cerebral atrophy. Electronically Signed: By: Kellie Simmering DO On: 02/22/2020 10:14        Scheduled Meds: . chlorhexidine gluconate (MEDLINE KIT)  15 mL Mouth Rinse BID  . Chlorhexidine Gluconate Cloth  6 each Topical Daily  . feeding supplement  237 mL Oral TID BM  . influenza vac split quadrivalent PF  0.5 mL Intramuscular Tomorrow-1000  . insulin aspart  0-5 Units Subcutaneous QHS  . insulin aspart  0-6 Units Subcutaneous TID WC  . ipratropium-albuterol  3 mL Nebulization Q6H  . isosorbide mononitrate  30 mg Oral Daily  . losartan  25 mg Oral Daily  . mouth rinse  15 mL Mouth Rinse BID  . metoprolol tartrate  37.5 mg Oral BID  . multivitamin with minerals  1 tablet Oral Daily  . pneumococcal 23 valent vaccine  0.5 mL Intramuscular Tomorrow-1000  . potassium chloride  40 mEq Oral Once  . senna-docusate  2 tablet Oral BID   Continuous Infusions: . dextrose 5 % and 0.45% NaCl 20  mL/hr at 02/23/20 1250  . famotidine (PEPCID) IV 20 mg (02/23/20 2046)  . levETIRAcetam 500 mg (02/23/20 1840)  . potassium chloride       LOS: 9 days    Time spent: 48mns    JKathie Dike MD [facility]   If 7PM-7AM, please contact night-coverage [email or web address]  02/23/2020, 8:48 PM

## 2020-02-23 NOTE — Procedures (Signed)
Antonio A. [name], MD     [email or web address]           HISTORY: The patient presents with recurrent seizures.  MEDICATIONS:  Current Facility-Administered Medications:  .  acetaminophen (TYLENOL) tablet 650 mg, 650 mg, Oral, Q6H PRN **OR** acetaminophen (TYLENOL) suppository 650 mg, 650 mg, Rectal, Q6H PRN, Emokpae, Ejiroghene E, MD, 650 mg at 02/23/20 0443 .  albuterol (PROVENTIL) (2.5 MG/3ML) 0.083% nebulizer solution 2.5 mg, 2.5 mg, Nebulization, Q4H PRN, Emokpae, Courage, MD .  chlorhexidine gluconate (MEDLINE KIT) (PERIDEX) 0.12 % solution 15 mL, 15 mL, Mouth Rinse, BID, Emokpae, Ejiroghene E, MD, 15 mL at 02/23/20 1111 .  Chlorhexidine Gluconate Cloth 2 % PADS 6 each, 6 each, Topical, Daily, Anders Simmonds, MD, 6 each at 02/23/20 1112 .  dextrose 5 %-0.45 % sodium chloride infusion, , Intravenous, Continuous, Emokpae, Courage, MD, Last Rate: 20 mL/hr at 02/23/20 1250, New Bag at 02/23/20 1250 .  diphenhydrAMINE (BENADRYL) injection 25 mg, 25 mg, Intravenous, Q6H PRN, Emokpae, Courage, MD .  famotidine (PEPCID) IVPB 20 mg premix, 20 mg, Intravenous, Q12H, Emokpae, Ejiroghene E, MD, Last Rate: 100 mL/hr at 02/23/20 1112, 20 mg at 02/23/20 1112 .  feeding supplement (ENSURE ENLIVE / ENSURE PLUS) liquid 237 mL, 237 mL, Oral, TID BM, Emokpae, Courage, MD, 237 mL at 02/21/20 2147 .  influenza vac split quadrivalent PF (FLUARIX) injection 0.5 mL, 0.5 mL, Intramuscular, Tomorrow-1000, Emokpae, Ejiroghene E, MD .  insulin aspart (novoLOG) injection 0-5 Units, 0-5 Units, Subcutaneous, QHS, Emokpae, Courage, MD .  insulin aspart (novoLOG) injection 0-6 Units, 0-6 Units, Subcutaneous, TID WC, Emokpae, Courage, MD .  ipratropium-albuterol (DUONEB) 0.5-2.5 (3) MG/3ML nebulizer solution 3 mL, 3 mL, Nebulization, Q6H, Emokpae, Courage, MD, 3 mL at 02/23/20 1311 .  isosorbide mononitrate (IMDUR) 24 hr tablet 30 mg, 30 mg, Oral, Daily, Emokpae, Courage, MD .  levETIRAcetam  (KEPPRA) IVPB 500 mg/100 mL premix, 500 mg, Intravenous, Q8H, Caley Ciaramitaro, MD, Last Rate: 400 mL/hr at 02/23/20 1117, 500 mg at 02/23/20 1117 .  LORazepam (ATIVAN) injection 0.5 mg, 0.5 mg, Intravenous, Q8H PRN, Emokpae, Courage, MD, 0.5 mg at 02/23/20 0114 .  losartan (COZAAR) tablet 25 mg, 25 mg, Oral, Daily, Satira Sark, MD, 25 mg at 02/21/20 0906 .  MEDLINE mouth rinse, 15 mL, Mouth Rinse, BID, Emokpae, Courage, MD, 15 mL at 02/23/20 1115 .  metoprolol tartrate (LOPRESSOR) tablet 37.5 mg, 37.5 mg, Oral, BID, Branch, Alphonse Guild, MD .  multivitamin with minerals tablet 1 tablet, 1 tablet, Oral, Daily, Emokpae, Courage, MD, 1 tablet at 02/21/20 1700 .  ondansetron (ZOFRAN) tablet 4 mg, 4 mg, Oral, Q6H PRN **OR** ondansetron (ZOFRAN) injection 4 mg, 4 mg, Intravenous, Q6H PRN, Emokpae, Ejiroghene E, MD .  oxyCODONE (Oxy IR/ROXICODONE) immediate release tablet 10 mg, 10 mg, Oral, Q6H PRN, Emokpae, Courage, MD .  pneumococcal 23 valent vaccine (PNEUMOVAX-23) injection 0.5 mL, 0.5 mL, Intramuscular, Tomorrow-1000, Emokpae, Ejiroghene E, MD .  potassium chloride (KLOR-CON) packet 40 mEq, 40 mEq, Oral, Once, Zierle-Ghosh, Asia B, DO .  senna-docusate (Senokot-S) tablet 2 tablet, 2 tablet, Oral, BID, Emokpae, Courage, MD     ANALYSIS: A 16 channel recording using standard 10 20 measurements is conducted for 26 minutes.  There is significant myogenic artifact seen throughout the recording.  The background activity gets as high as 6 to 7 Hz.  Photic stimulation and hyperventilation are not conducted.  There is no focal or lateral slowing.  There is no epileptiform activity is  noted.   IMPRESSION: 1.  The recording shows a mild to moderate global slowing indicating a mild to moderate global encephalopathy.  No epileptiform activities are noted.      Antonio Ramirez A. Merlene Ramirez, M.D.  Diplomate, Tax adviser of Psychiatry and Neurology ( Neurology).

## 2020-02-23 NOTE — Progress Notes (Signed)
[place] [name], MD     [email or web address]          Antonio Ramirez is an 59 y.o. male.   ASSESSMENT/PLAN: 1.  Acute encephalopathy likely due to multiple seizures/status epilepticus: No clinical seizures reported today or noted. He is more responsive but still very confused. EEG shows slowing but no epileptiform discharge. We will continue with the [place] and continue to monitor the patient. 2.  Small subretinal hemorrhage right posterior frontal lobe likely nonaneurysmal: Hold aspirin therapy and other antiplatelet agents for 2 weeks.  Restart after 2 weeks with only a single agent.  There seems no indication for dual antiplatelet agents at this time.  Repeat scan has been so for the next day or 2. 3.  Remote right cortical temporal infarct      GENERAL: The patient seems mostly nonresponsive.  He is in no acute distress however.  The patient had a couple of episodes where his eyes deviated towards the left.  HEENT: Neck is supple no trauma noted.  ABDOMEN: soft  EXTREMITIES: There is mild swelling of the left ankle and left foot.  BACK: Normal  SKIN: Normal by inspection.    MENTAL STATUS: He is awake and responsive but confused. He is speaking nonsensical speech and does not follow commands. He no longer has deviation to the eyes are neck to the left side today.   CRANIAL NERVES: Pupils are equal, round and reactive to light; extra ocular movements are full, there is no significant nystagmus; visual fields are full; upper and lower facial muscles are normal in strength and symmetric, there is no flattening of the nasolabial folds  MOTOR: He moves the left side spontaneously with antigravity strength especially left upper extremity.  The right side seems somewhat weaker with strength approximately 2/5.  COORDINATION: No clonic activity is noted today.  No tremors or myoclonus or dysmetria.  REFLEXES: Deep tendon reflexes are symmetrical and  normal.   SENSATION: He responds to painful stimuli bilaterally.      Blood pressure (!) 147/74, pulse 66, temperature 99 F (37.2 C), temperature source Axillary, resp. rate 18, height 6' 1" (1.854 m), weight 82.5 kg, SpO2 93 %.  Past Medical History:  Diagnosis Date  . CAD (coronary artery disease)   . COPD (chronic obstructive pulmonary disease) ([place])   . Opioid dependence ([place])   . Smoker     Past Surgical History:  Procedure Laterality Date  . cardiac bypass    . CARDIAC SURGERY    . COLON SURGERY      Family History  Problem Relation Age of Onset  . COPD Mother     Social History:  reports that he has been smoking cigarettes. He has been smoking about 1.00 pack per day. He has never used smokeless tobacco. He reports that he does not drink alcohol and does not use drugs.  Allergies:  Allergies  Allergen Reactions  . Opana [Oxymorphone Hcl] Hives    hiv    Medications: Prior to Admission medications   Medication Sig Start Date End Date Taking? Authorizing Provider  albuterol (VENTOLIN HFA) 108 (90 Base) MCG/ACT inhaler Inhale 1-2 puffs into the lungs every 6 (six) hours as needed for wheezing or shortness of breath. 06/03/19  Yes Johnson, Clanford L, MD  AMITIZA 24 MCG capsule Take 24 mcg by mouth 2 (two) times daily with a meal. 05/17/19  Yes [provider]  aspirin EC 81 MG tablet Take 1 tablet (81  mg total) by mouth daily. 06/03/19  Yes Johnson, Clanford L, MD  aspirin-acetaminophen-caffeine (EXCEDRIN MIGRAINE) [phone] MG tablet Take 2 tablets by mouth every 6 (six) hours as needed for headache.   Yes [provider]  atorvastatin (LIPITOR) 40 MG tablet Take 1 tablet (40 mg total) by mouth daily at 6 PM. 06/03/19  Yes Johnson, Clanford L, MD  calcitRIOL (ROCALTROL) 0.5 MCG capsule Take 0.5 mcg by mouth daily. 03/16/19  Yes [provider]  citalopram (CELEXA) 10 MG tablet Take 1 tablet (10 mg total) by mouth daily. 07/04/19  Yes Patrecia Pour, MD  clopidogrel (PLAVIX) 75 MG tablet Take 75 mg by mouth daily. 06/27/19  Yes [provider]  gabapentin (NEURONTIN) 600 MG tablet Take 600 mg by mouth at bedtime.  05/27/19  Yes [provider]  isosorbide mononitrate (IMDUR) 30 MG 24 hr tablet Take 30 mg by mouth daily.  01/22/17  Yes [provider]  omeprazole (PRILOSEC) 20 MG capsule Take 20 mg by mouth daily before breakfast. 30 min prior to breakfast. 04/13/19  Yes [provider]  oxyCODONE (ROXICODONE) 15 MG immediate release tablet Take 1 tablet (15 mg total) by mouth 3 (three) times daily as needed for pain. 07/04/19  Yes Patrecia Pour, MD  senna-docusate (SENOKOT-S) 8.6-50 MG tablet Take 2 tablets by mouth at bedtime as needed for mild constipation. 06/03/19  Yes Johnson, Clanford L, MD  SYMBICORT 160-4.5 MCG/ACT inhaler Inhale 2 puffs into the lungs 2 (two) times daily. 04/29/19  Yes [provider]  XTAMPZA ER 36 MG C12A Take 1 capsule (36 mg total) by mouth every 12 (twelve) hours. 07/04/19  Yes Patrecia Pour, MD  polyethylene glycol (MIRALAX / GLYCOLAX) packet Take 17 g by mouth daily. Patient not taking: Reported on 02/13/2020 02/11/17   Margette Fast, MD    Scheduled Meds: . chlorhexidine gluconate (MEDLINE KIT)  15 mL Mouth Rinse BID  . Chlorhexidine Gluconate Cloth  6 each Topical Daily  . feeding supplement  237 mL Oral TID BM  . influenza vac split quadrivalent PF  0.5 mL Intramuscular Tomorrow-1000  . insulin aspart  0-5 Units Subcutaneous QHS  . insulin aspart  0-6 Units Subcutaneous TID WC  . ipratropium-albuterol  3 mL Nebulization Q6H  . isosorbide mononitrate  30 mg Oral Daily  . losartan  25 mg Oral Daily  . mouth rinse  15 mL Mouth Rinse BID  . metoprolol tartrate  37.5 mg Oral BID  . multivitamin with minerals  1 tablet Oral Daily  . pneumococcal 23 valent vaccine  0.5 mL Intramuscular Tomorrow-1000  . potassium chloride  40 mEq Oral Once  . senna-docusate  2  tablet Oral BID   Continuous Infusions: . dextrose 5 % and 0.45% NaCl 20 mL/hr at 02/23/20 1250  . famotidine (PEPCID) IV 20 mg (02/23/20 1112)  . levETIRAcetam 500 mg (02/23/20 1117)   PRN Meds:.acetaminophen **OR** acetaminophen, albuterol, diphenhydrAMINE, LORazepam, ondansetron **OR** ondansetron (ZOFRAN) IV, oxyCODONE     Results for orders placed or performed during the hospital encounter of 02/14/20 (from the past 48 hour(s))  Glucose, capillary     Status: Abnormal   Collection Time: 02/21/20  7:27 PM  Result Value Ref Range   Glucose-Capillary 128 (H) 70 - 99 mg/dL    Comment: Glucose reference range applies only to samples taken after fasting for at least 8 hours.  CBC     Status: Abnormal   Collection Time: 02/22/20  4:32  AM  Result Value Ref Range   WBC 10.3 4.0 - 10.5 K/uL   RBC 5.99 (H) 4.22 - 5.81 MIL/uL   Hemoglobin 16.2 13.0 - 17.0 g/dL   HCT 52.4 (H) 39 - 52 %   MCV 87.5 80.0 - 100.0 fL   MCH 27.0 26.0 - 34.0 pg   MCHC 30.9 30.0 - 36.0 g/dL   RDW 19.2 (H) 11.5 - 15.5 %   Platelets 191 150 - 400 K/uL   nRBC 0.0 0.0 - 0.2 %    Comment: Performed at [facility], [address], [place], [place] [address]  Basic metabolic panel     Status: Abnormal   Collection Time: 02/22/20  4:32 AM  Result Value Ref Range   Sodium 139 135 - 145 mmol/L   Potassium 3.0 (L) 3.5 - 5.1 mmol/L   Chloride 89 (L) 98 - 111 mmol/L   CO2 41 (H) 22 - 32 mmol/L   Glucose, Bld 103 (H) 70 - 99 mg/dL    Comment: Glucose reference range applies only to samples taken after fasting for at least 8 hours.   BUN 15 6 - 20 mg/dL   Creatinine, Ser 0.55 (L) 0.61 - 1.24 mg/dL   Calcium 9.6 8.9 - 10.3 mg/dL   GFR, Estimated >60 >60 mL/min    Comment: (NOTE) Calculated using the CKD-EPI Creatinine Equation (2021)    Anion gap 9 5 - 15    Comment: Performed at [facility], [address], [place], [place] [address]  Magnesium     Status: None   Collection Time: 02/22/20  4:32 AM  Result Value  Ref Range   Magnesium 1.8 1.7 - 2.4 mg/dL    Comment: Performed at [facility], [address], [place], [place] [address]  Glucose, capillary     Status: Abnormal   Collection Time: 02/22/20  8:03 AM  Result Value Ref Range   Glucose-Capillary 100 (H) 70 - 99 mg/dL    Comment: Glucose reference range applies only to samples taken after fasting for at least 8 hours.  Blood gas, arterial     Status: Abnormal   Collection Time: 02/22/20  8:45 AM  Result Value Ref Range   FIO2 28.00    pH, Arterial 7.456 (H) 7.35 - 7.45   pCO2 arterial 69.1 (HH) 32 - 48 mmHg    Comment: CRITICAL RESULT CALLED TO, READ BACK BY AND VERIFIED WITH: BENGSTON,P AT 9:05AM ON 02/22/20 BY FESTERMAN,C    pO2, Arterial 68.3 (L) 83 - 108 mmHg   Bicarbonate 43.1 (H) 20.0 - 28.0 mmol/L   Acid-Base Excess 21.8 (H) 0.0 - 2.0 mmol/L   O2 Saturation 92.3 %   Patient temperature 38.0    Allens test (pass/fail) PASS PASS    Comment: Performed at [facility], [address], [place], [place] [address]  Glucose, capillary     Status: Abnormal   Collection Time: 02/22/20 11:55 AM  Result Value Ref Range   Glucose-Capillary 116 (H) 70 - 99 mg/dL    Comment: Glucose reference range applies only to samples taken after fasting for at least 8 hours.  Glucose, capillary     Status: None   Collection Time: 02/22/20  4:47 PM  Result Value Ref Range   Glucose-Capillary 91 70 - 99 mg/dL    Comment: Glucose reference range applies only to samples taken after fasting for at least 8 hours.  Glucose, capillary     Status: None   Collection Time: 02/22/20  9:48 PM  Result Value Ref Range   Glucose-Capillary 99 70 - 99 mg/dL    Comment: Glucose reference range applies only to samples taken after fasting for at least 8 hours.   Comment 1 Notify RN    Comment 2 Document in Chart   CBC     Status: Abnormal   Collection Time: 02/23/20  4:08 AM  Result Value Ref Range   WBC 11.2 (H) 4.0 - 10.5 K/uL   RBC 5.84 (H) 4.22 - 5.81 MIL/uL    Hemoglobin 16.0 13.0 - 17.0 g/dL   HCT 50.8 39 - 52 %   MCV 87.0 80.0 - 100.0 fL   MCH 27.4 26.0 - 34.0 pg   MCHC 31.5 30.0 - 36.0 g/dL   RDW 19.9 (H) 11.5 - 15.5 %   Platelets 197 150 - 400 K/uL   nRBC 0.0 0.0 - 0.2 %    Comment: Performed at [facility], [address] [place], [place] [address]  Magnesium     Status: None   Collection Time: 02/23/20  4:08 AM  Result Value Ref Range   Magnesium 1.9 1.7 - 2.4 mg/dL    Comment: Performed at [facility], [address] [place], [place] [address]  Comprehensive metabolic panel     Status: Abnormal   Collection Time: 02/23/20  4:08 AM  Result Value Ref Range   Sodium 142 135 - 145 mmol/L   Potassium 2.4 (LL) 3.5 - 5.1 mmol/L    Comment: CRITICAL RESULT CALLED TO, READ BACK BY AND VERIFIED WITH: BROWN,T AT 5:05AM ON 02/23/20 BY FESTERMAN,C    Chloride 91 (L) 98 - 111 mmol/L   CO2 41 (H) 22 - 32 mmol/L   Glucose, Bld 95 70 - 99 mg/dL    Comment: Glucose reference range applies only to samples taken after fasting for at least 8 hours.   BUN 16 6 - 20 mg/dL   Creatinine, Ser 0.63 0.61 - 1.24 mg/dL   Calcium 9.6 8.9 - 10.3 mg/dL   Total Protein 5.8 (L) 6.5 - 8.1 g/dL   Albumin 3.0 (L) 3.5 - 5.0 g/dL   AST 48 (H) 15 - 41 U/L   ALT 47 (H) 0 - 44 U/L   Alkaline Phosphatase 66 38 - 126 U/L   Total Bilirubin 2.3 (H) 0.3 - 1.2 mg/dL   GFR, Estimated >60 >60 mL/min    Comment: (NOTE) Calculated using the CKD-EPI Creatinine Equation (2021)    Anion gap 10 5 - 15    Comment: Performed at [facility], [address] [place], [place] [address]  Glucose, capillary     Status: None   Collection Time: 02/23/20 12:02 PM  Result Value Ref Range   Glucose-Capillary 81 70 - 99 mg/dL    Comment: Glucose reference range applies only to samples taken after fasting for at least 8 hours.  Glucose, capillary     Status: None   Collection Time: 02/23/20  4:26 PM  Result Value Ref Range   Glucose-Capillary 84 70 - 99 mg/dL    Comment: Glucose  reference range applies only to samples taken after fasting for at least 8 hours.    Studies/Results:  EEG Technical aspects: This EEG study was done with scalp electrodes positioned according to the 10-20 International system of electrode placement. Electrical activity was acquired at a sampling rate of 500Hz and reviewed with a high frequency filter of 70Hz and a low frequency filter of 1Hz. EEG data were recorded continuously  and digitally stored.   Description: No clear posterior dominant rhythm was seen.  EEG showed continuous generalized 3 to 6 Hz theta delta slowing.  Hyperventilation and photic stimulation were not performed.     ABNORMALITY -Continuous slow, generalized  IMPRESSION: This study is suggestive of moderate diffuse encephalopathy, nonspecific etiology.  No seizures or epileptiform discharges were seen throughout the recording.     HEAD CT FINDINGS: Brain:  Significantly motion degraded examination, limiting evaluation.  Stable generalized cerebral atrophy.  New from the prior examination of 02/18/2020, there is small volume acute subarachnoid hemorrhage overlying the right parietal and frontal lobes (for instance as seen on series 10, image 10) (series 2, image 9).  Redemonstrated chronic cortical/subcortical infarct within the right parietal and posterosuperior right temporal lobes.  No acute demarcated cortical infarct is identified.  No evidence of intracranial mass.  No midline shift.  Vascular: No hyperdense vessel.  Skull: No fracture or focal suspicious osseous lesion identified.  Sinuses/Orbits: Visualized orbits show no acute finding. Mild ethmoid sinus mucosal thickening at the imaged levels.  IMPRESSION: Significantly motion degraded examination, limiting evaluation.  Small volume acute subarachnoid hemorrhage overlying the right parietal and frontal lobes, new as compared to the head CT  of 02/18/2020.  Redemonstrated chronic cortical/subcortical infarcts within the right parietal and temporal lobes.  Stable generalized cerebral atrophy.    The head CT scan is reviewed in person. There is small subarachnoid hemorrhage noted in the posterior right frontal area seen in 2 3 clots. This is new with hyperdensity. There is also a right temporal encephalomalacia consistent with remote cortical infarct.     Merridy Pascoe A. [name], M.D.  Diplomate, Tax adviser of Psychiatry and Neurology ( Neurology). 02/23/2020, 5:37 PM

## 2020-02-23 NOTE — Progress Notes (Signed)
   Progress Note  Patient Name: Antonio Ramirez Date of Encounter: 02/23/2020  Primary Cardiologist: Carlyle Dolly, MD  Chart reviewed including events of yesterday.  Patient with acute encephalopathy, also seizures head CT documenting a small subarachnoid hemorrhage.  I personally reviewed his telemetry.  He is in sinus rhythm in the 60s this morning, continues to have bursts of SVT versus atrial flutter, nothing sustained.  Lopressor was changed to 37.5 mg twice daily yesterday in an attempt to reduce bradycardia resulting in holding of medication.  Follow-up potassium 2.4, magnesium 1.9, hemoglobin 16.0.  Replete potassium, may also need IV supplementation.  Maintain Lopressor 37.5 mg twice daily with further observation.  He is clearly not a candidate for anticoagulation at this point.  Signed, Rozann Lesches, MD  02/23/2020, 10:40 AM

## 2020-02-24 ENCOUNTER — Inpatient Hospital Stay (HOSPITAL_COMMUNITY): Payer: Medicare Other

## 2020-02-24 DIAGNOSIS — F1129 Opioid dependence with unspecified opioid-induced disorder: Secondary | ICD-10-CM | POA: Diagnosis not present

## 2020-02-24 DIAGNOSIS — J441 Chronic obstructive pulmonary disease with (acute) exacerbation: Secondary | ICD-10-CM | POA: Diagnosis not present

## 2020-02-24 DIAGNOSIS — I484 Atypical atrial flutter: Secondary | ICD-10-CM

## 2020-02-24 DIAGNOSIS — J9602 Acute respiratory failure with hypercapnia: Secondary | ICD-10-CM | POA: Diagnosis not present

## 2020-02-24 LAB — CBC
HCT: 52.1 % — ABNORMAL HIGH (ref 39.0–52.0)
Hemoglobin: 15.9 g/dL (ref 13.0–17.0)
MCH: 27.2 pg (ref 26.0–34.0)
MCHC: 30.5 g/dL (ref 30.0–36.0)
MCV: 89.1 fL (ref 80.0–100.0)
Platelets: 186 10*3/uL (ref 150–400)
RBC: 5.85 MIL/uL — ABNORMAL HIGH (ref 4.22–5.81)
RDW: 19.9 % — ABNORMAL HIGH (ref 11.5–15.5)
WBC: 11.7 10*3/uL — ABNORMAL HIGH (ref 4.0–10.5)
nRBC: 0 % (ref 0.0–0.2)

## 2020-02-24 LAB — GLUCOSE, CAPILLARY
Glucose-Capillary: 78 mg/dL (ref 70–99)
Glucose-Capillary: 80 mg/dL (ref 70–99)
Glucose-Capillary: 92 mg/dL (ref 70–99)
Glucose-Capillary: 93 mg/dL (ref 70–99)
Glucose-Capillary: 98 mg/dL (ref 70–99)
Glucose-Capillary: 99 mg/dL (ref 70–99)

## 2020-02-24 LAB — COMPREHENSIVE METABOLIC PANEL
ALT: 42 U/L (ref 0–44)
AST: 42 U/L — ABNORMAL HIGH (ref 15–41)
Albumin: 2.8 g/dL — ABNORMAL LOW (ref 3.5–5.0)
Alkaline Phosphatase: 62 U/L (ref 38–126)
Anion gap: 10 (ref 5–15)
BUN: 17 mg/dL (ref 6–20)
CO2: 38 mmol/L — ABNORMAL HIGH (ref 22–32)
Calcium: 9.5 mg/dL (ref 8.9–10.3)
Chloride: 93 mmol/L — ABNORMAL LOW (ref 98–111)
Creatinine, Ser: 0.73 mg/dL (ref 0.61–1.24)
GFR, Estimated: >60 mL/min (ref >60)
Glucose, Bld: 98 mg/dL (ref 70–99)
Potassium: 3.1 mmol/L — ABNORMAL LOW (ref 3.5–5.1)
Sodium: 141 mmol/L (ref 135–145)
Total Bilirubin: 2.2 mg/dL — ABNORMAL HIGH (ref 0.3–1.2)
Total Protein: 5.7 g/dL — ABNORMAL LOW (ref 6.5–8.1)

## 2020-02-24 LAB — BLOOD GAS, ARTERIAL
Acid-Base Excess: 18.4 mmol/L — ABNORMAL HIGH (ref 0.0–2.0)
Bicarbonate: 38.5 mmol/L — ABNORMAL HIGH (ref 20.0–28.0)
FIO2: 32
O2 Saturation: 93 %
Patient temperature: 37
pCO2 arterial: 77.6 mmHg (ref 32.0–48.0)
pH, Arterial: 7.378 (ref 7.350–7.450)
pO2, Arterial: 74.3 mmHg — ABNORMAL LOW (ref 83.0–108.0)

## 2020-02-24 LAB — AMMONIA: Ammonia: 53 umol/L — ABNORMAL HIGH (ref 9–35)

## 2020-02-24 LAB — LACTIC ACID, PLASMA: Lactic Acid, Venous: 0.9 mmol/L (ref 0.5–1.9)

## 2020-02-24 MED ORDER — LORAZEPAM 2 MG/ML IJ SOLN
2.0000 mg | Freq: Once | INTRAMUSCULAR | Status: AC
  Administered 2020-02-24: 2 mg via INTRAVENOUS

## 2020-02-24 MED ORDER — LORAZEPAM 2 MG/ML IJ SOLN
INTRAMUSCULAR | Status: AC
  Filled 2020-02-24: qty 1

## 2020-02-24 MED ORDER — METOPROLOL TARTRATE 5 MG/5ML IV SOLN
5.0000 mg | Freq: Four times a day (QID) | INTRAVENOUS | Status: DC
  Administered 2020-02-24 – 2020-03-02 (×18): 5 mg via INTRAVENOUS
  Filled 2020-02-24 (×20): qty 5

## 2020-02-24 MED ORDER — SODIUM CHLORIDE 0.9 % IV SOLN
200.0000 mg | Freq: Two times a day (BID) | INTRAVENOUS | Status: DC
  Administered 2020-02-24 – 2020-02-29 (×12): 200 mg via INTRAVENOUS
  Filled 2020-02-24 (×19): qty 20

## 2020-02-24 MED ORDER — LEVETIRACETAM IN NACL 1000 MG/100ML IV SOLN
1000.0000 mg | Freq: Three times a day (TID) | INTRAVENOUS | Status: DC
  Administered 2020-02-24 – 2020-02-29 (×16): 1000 mg via INTRAVENOUS
  Filled 2020-02-24 (×16): qty 100

## 2020-02-24 MED ORDER — LACOSAMIDE NICU IV SYRINGE 10 MG/ML: 200.0000 mg | Freq: Two times a day (BID) | INTRAVENOUS | Status: DC

## 2020-02-24 MED ORDER — POTASSIUM CHLORIDE 10 MEQ/100ML IV SOLN
10.0000 meq | INTRAVENOUS | Status: AC
  Administered 2020-02-24 (×4): 10 meq via INTRAVENOUS
  Filled 2020-02-24 (×4): qty 100

## 2020-02-24 MED ORDER — IPRATROPIUM-ALBUTEROL 0.5-2.5 (3) MG/3ML IN SOLN
3.0000 mL | Freq: Two times a day (BID) | RESPIRATORY_TRACT | Status: DC
  Administered 2020-02-24 – 2020-02-25 (×2): 3 mL via RESPIRATORY_TRACT
  Filled 2020-02-24 (×2): qty 3

## 2020-02-24 MED ORDER — LACTULOSE ENEMA
300.0000 mL | Freq: Two times a day (BID) | ORAL | Status: DC
  Administered 2020-02-25 – 2020-02-26 (×4): 300 mL via RECTAL
  Filled 2020-02-24 (×11): qty 300

## 2020-02-24 NOTE — Progress Notes (Signed)
Rapid response called for seizure. Head to left, and left sided tonic-clonic activity. Event started at 12:34. 2mg  ativan given approx 12:42AM. Seizure activity ceased, but patient was not able to follow commands. Patient again started seizure activity at 12:47, another 2 mg ativan given. Seizure activity ceased. Through this, vitals remained stable, with O2 100% on NRB. Keppra dose was moved up from 3am. Given history of subarachnoid, CT head without contrast ordered stat.

## 2020-02-24 NOTE — Progress Notes (Signed)
   Progress Note  Patient Name: Antonio Ramirez Date of Encounter: 02/24/2020  Primary Cardiologist: Carlyle Dolly, MD  Chart reviewed.  Patient now back in ICU, has had recurrent seizures.  Per nursing not able to give p.o. medications.  Follow-up head CT shows a stable, small volume frontal and parietal subarachnoid hemorrhage, no obvious acute infarct or mass-effect.  Neurology is following.  Telemetry reviewed, no significant SVT/atrial flutter.  Most recently on Lopressor 37.5 mg twice daily, but has not been given consistently due to neurological changes.  Pertinent lab work includes potassium 3.1, creatinine 0.73, WBC 11.7, hemoglobin 15.9.  Systolic blood pressure recently running 140s to 170s, heart rate is in the 60s in sinus rhythm.  Suggest conversion back to IV medications.  He had been tolerating Lopressor 5 mg IV every 6 hours prior to last attempt at oral medications.  Depending on systolic blood pressure (and recommendations per neurology) can also use IV enalaprilat if necessary, hold off on losartan and Imdur for now.  Signed, Rozann Lesches, MD  02/24/2020, 8:53 AM

## 2020-02-24 NOTE — Progress Notes (Signed)
[place] [name], MD     [email or web address]          Antonio Ramirez is an 59 y.o. male.   ASSESSMENT/PLAN: 1.  Acute encephalopathy likely due to multiple seizures/status epilepticus: Vimpat has been started at a high dose and Keppra has been increased to normal maximum of 3 g a day.  Arrangements have been made for the patient to be transferred to [facility] for continuous EEG monitoring. 2.  Small subretinal hemorrhage right posterior frontal lobe likely nonaneurysmal: Hold aspirin therapy and other antiplatelet agents for 2 weeks.  Restart after 2 weeks with only a single agent.  There seems no indication for dual antiplatelet agents at this time.  Repeat scan has been so for the next day or 2. 3.  Remote right cortical temporal infarct   It appears the patient had a few seizures overnight and was transferred to the ICU.  Description seems to be tonic-clonic activity on the right side.  He also has been more unresponsive.  Given this, the patient was started on high dose of Vimpat.  The Keppra was also increased.   GENERAL: The patient seems mostly nonresponsive.  Breathing is somewhat more rapid and slightly labored.  HEENT: Neck is supple no trauma noted.  ABDOMEN: soft  EXTREMITIES: There is mild swelling of the left ankle and left foot.  BACK: Normal  SKIN: Normal by inspection.    MENTAL STATUS: He lays in bed with eyes closed.  He does open his eyes to sternal rub and focuses.  Eyes are midline but frequently deviates to the right this time.  The patient does not follow commands.  CRANIAL NERVES: Pupils are equal, round and reactive to light; extra ocular movements are full, there is no significant nystagmus; visual fields are full -direct threat; upper and lower facial muscles are normal in strength and symmetric, there is no flattening of the nasolabial folds  MOTOR: He moves the left side spontaneously with antigravity strength especially  left upper extremity.  The right side seems somewhat weaker with strength approximately 2/5.  COORDINATION: No clonic activity is noted today.  No tremors or myoclonus or dysmetria.  REFLEXES: Deep tendon reflexes are symmetrical and normal.   SENSATION: He responds to painful stimuli bilaterally.      Blood pressure (!) 152/78, pulse 73, temperature 97.8 F (36.6 C), temperature source Axillary, resp. rate (!) 25, height _0  (1.854 m), weight 82.5 kg, SpO2 93 %.  Past Medical History:  Diagnosis Date  . CAD (coronary artery disease)   . COPD (chronic obstructive pulmonary disease) ([place])   . Opioid dependence ([place])   . Smoker     Past Surgical History:  Procedure Laterality Date  . cardiac bypass    . CARDIAC SURGERY    . COLON SURGERY      Family History  Problem Relation Age of Onset  . COPD Mother     Social History:  reports that he has been smoking cigarettes. He has been smoking about 1.00 pack per day. He has never used smokeless tobacco. He reports that he does not drink alcohol and does not use drugs.  Allergies:  Allergies  Allergen Reactions  . Opana [Oxymorphone Hcl] Hives    hiv    Medications: Prior to Admission medications   Medication Sig Start Date End Date Taking? Authorizing Provider  albuterol (VENTOLIN HFA) 108 (90 Base) MCG/ACT inhaler Inhale 1-2 puffs into the lungs every 6 (six) hours as  needed for wheezing or shortness of breath. 06/03/19  Yes Johnson, Clanford L, MD  AMITIZA 24 MCG capsule Take 24 mcg by mouth 2 (two) times daily with a meal. 05/17/19  Yes [provider]  aspirin EC 81 MG tablet Take 1 tablet (81 mg total) by mouth daily. 06/03/19  Yes Johnson, Clanford L, MD  aspirin-acetaminophen-caffeine (EXCEDRIN MIGRAINE) [phone] MG tablet Take 2 tablets by mouth every 6 (six) hours as needed for headache.   Yes [provider]  atorvastatin (LIPITOR) 40 MG tablet Take 1 tablet (40 mg total) by mouth daily at 6 PM.  06/03/19  Yes Johnson, Clanford L, MD  calcitRIOL (ROCALTROL) 0.5 MCG capsule Take 0.5 mcg by mouth daily. 03/16/19  Yes [provider]  citalopram (CELEXA) 10 MG tablet Take 1 tablet (10 mg total) by mouth daily. 07/04/19  Yes Patrecia Pour, MD  clopidogrel (PLAVIX) 75 MG tablet Take 75 mg by mouth daily. 06/27/19  Yes [provider]  gabapentin (NEURONTIN) 600 MG tablet Take 600 mg by mouth at bedtime.  05/27/19  Yes [provider]  isosorbide mononitrate (IMDUR) 30 MG 24 hr tablet Take 30 mg by mouth daily.  01/22/17  Yes [provider]  omeprazole (PRILOSEC) 20 MG capsule Take 20 mg by mouth daily before breakfast. 30 min prior to breakfast. 04/13/19  Yes [provider]  oxyCODONE (ROXICODONE) 15 MG immediate release tablet Take 1 tablet (15 mg total) by mouth 3 (three) times daily as needed for pain. 07/04/19  Yes Patrecia Pour, MD  senna-docusate (SENOKOT-S) 8.6-50 MG tablet Take 2 tablets by mouth at bedtime as needed for mild constipation. 06/03/19  Yes Johnson, Clanford L, MD  SYMBICORT 160-4.5 MCG/ACT inhaler Inhale 2 puffs into the lungs 2 (two) times daily. 04/29/19  Yes [provider]  XTAMPZA ER 36 MG C12A Take 1 capsule (36 mg total) by mouth every 12 (twelve) hours. 07/04/19  Yes Patrecia Pour, MD  polyethylene glycol (MIRALAX / GLYCOLAX) packet Take 17 g by mouth daily. Patient not taking: Reported on 02/13/2020 02/11/17   Margette Fast, MD    Scheduled Meds: . chlorhexidine gluconate (MEDLINE KIT)  15 mL Mouth Rinse BID  . Chlorhexidine Gluconate Cloth  6 each Topical Daily  . feeding supplement  237 mL Oral TID BM  . influenza vac split quadrivalent PF  0.5 mL Intramuscular Tomorrow-1000  . insulin aspart  0-5 Units Subcutaneous QHS  . insulin aspart  0-6 Units Subcutaneous TID WC  . ipratropium-albuterol  3 mL Nebulization BID  . lactulose  300 mL Rectal BID  . mouth rinse  15 mL Mouth Rinse BID  . metoprolol tartrate  5  mg Intravenous Q6H  . multivitamin with minerals  1 tablet Oral Daily  . pneumococcal 23 valent vaccine  0.5 mL Intramuscular Tomorrow-1000  . potassium chloride  40 mEq Oral Once  . senna-docusate  2 tablet Oral BID   Continuous Infusions: . dextrose 5 % and 0.45% NaCl 20 mL/hr at 02/24/20 1442  . famotidine (PEPCID) IV Stopped (02/24/20 1054)  . lacosamide (VIMPAT) IV Stopped (02/24/20 1159)  . levETIRAcetam 1,000 mg (02/24/20 1703)   PRN Meds:.acetaminophen **OR** acetaminophen, albuterol, diphenhydrAMINE, LORazepam, ondansetron **OR** ondansetron (ZOFRAN) IV, oxyCODONE     Results for orders placed or performed during the hospital encounter of 02/14/20 (from the past 48 hour(s))  Glucose, capillary     Status: None   Collection Time: 02/22/20  9:48 PM  Result Value  Ref Range   Glucose-Capillary 99 70 - 99 mg/dL    Comment: Glucose reference range applies only to samples taken after fasting for at least 8 hours.   Comment 1 Notify RN    Comment 2 Document in Chart   CBC     Status: Abnormal   Collection Time: 02/23/20  4:08 AM  Result Value Ref Range   WBC 11.2 (H) 4.0 - 10.5 K/uL   RBC 5.84 (H) 4.22 - 5.81 MIL/uL   Hemoglobin 16.0 13.0 - 17.0 g/dL   HCT 50.8 39 - 52 %   MCV 87.0 80.0 - 100.0 fL   MCH 27.4 26.0 - 34.0 pg   MCHC 31.5 30.0 - 36.0 g/dL   RDW 19.9 (H) 11.5 - 15.5 %   Platelets 197 150 - 400 K/uL   nRBC 0.0 0.0 - 0.2 %    Comment: Performed at [facility], [address] [place], [place] [address]  Magnesium     Status: None   Collection Time: 02/23/20  4:08 AM  Result Value Ref Range   Magnesium 1.9 1.7 - 2.4 mg/dL    Comment: Performed at [facility], [address] [place], [place] [address]  Comprehensive metabolic panel     Status: Abnormal   Collection Time: 02/23/20  4:08 AM  Result Value Ref Range   Sodium 142 135 - 145 mmol/L   Potassium 2.4 (LL) 3.5 - 5.1 mmol/L    Comment: CRITICAL RESULT CALLED TO, READ BACK BY AND VERIFIED WITH: BROWN,T  AT 5:05AM ON 02/23/20 BY FESTERMAN,C    Chloride 91 (L) 98 - 111 mmol/L   CO2 41 (H) 22 - 32 mmol/L   Glucose, Bld 95 70 - 99 mg/dL    Comment: Glucose reference range applies only to samples taken after fasting for at least 8 hours.   BUN 16 6 - 20 mg/dL   Creatinine, Ser 0.63 0.61 - 1.24 mg/dL   Calcium 9.6 8.9 - 10.3 mg/dL   Total Protein 5.8 (L) 6.5 - 8.1 g/dL   Albumin 3.0 (L) 3.5 - 5.0 g/dL   AST 48 (H) 15 - 41 U/L   ALT 47 (H) 0 - 44 U/L   Alkaline Phosphatase 66 38 - 126 U/L   Total Bilirubin 2.3 (H) 0.3 - 1.2 mg/dL   GFR, Estimated >60 >60 mL/min    Comment: (NOTE) Calculated using the CKD-EPI Creatinine Equation (2021)    Anion gap 10 5 - 15    Comment: Performed at [facility], [address] [place], [place] [address]  Glucose, capillary     Status: None   Collection Time: 02/23/20 12:02 PM  Result Value Ref Range   Glucose-Capillary 81 70 - 99 mg/dL    Comment: Glucose reference range applies only to samples taken after fasting for at least 8 hours.  Glucose, capillary     Status: None   Collection Time: 02/23/20  4:26 PM  Result Value Ref Range   Glucose-Capillary 84 70 - 99 mg/dL    Comment: Glucose reference range applies only to samples taken after fasting for at least 8 hours.  Glucose, capillary     Status: None   Collection Time: 02/23/20  8:42 PM  Result Value Ref Range   Glucose-Capillary 80 70 - 99 mg/dL    Comment: Glucose reference range applies only to samples taken after fasting for at least 8 hours.  Glucose, capillary     Status: None   Collection Time: 02/24/20  12:03 AM  Result Value Ref Range   Glucose-Capillary 99 70 - 99 mg/dL    Comment: Glucose reference range applies only to samples taken after fasting for at least 8 hours.  Glucose, capillary     Status: None   Collection Time: 02/24/20 12:40 AM  Result Value Ref Range   Glucose-Capillary 92 70 - 99 mg/dL    Comment: Glucose reference range applies only to samples taken after fasting  for at least 8 hours.  Comprehensive metabolic panel     Status: Abnormal   Collection Time: 02/24/20  2:33 AM  Result Value Ref Range   Sodium 141 135 - 145 mmol/L   Potassium 3.1 (L) 3.5 - 5.1 mmol/L    Comment: DELTA CHECK NOTED   Chloride 93 (L) 98 - 111 mmol/L   CO2 38 (H) 22 - 32 mmol/L   Glucose, Bld 98 70 - 99 mg/dL    Comment: Glucose reference range applies only to samples taken after fasting for at least 8 hours.   BUN 17 6 - 20 mg/dL   Creatinine, Ser 0.73 0.61 - 1.24 mg/dL   Calcium 9.5 8.9 - 10.3 mg/dL   Total Protein 5.7 (L) 6.5 - 8.1 g/dL   Albumin 2.8 (L) 3.5 - 5.0 g/dL   AST 42 (H) 15 - 41 U/L   ALT 42 0 - 44 U/L   Alkaline Phosphatase 62 38 - 126 U/L   Total Bilirubin 2.2 (H) 0.3 - 1.2 mg/dL   GFR, Estimated >60 >60 mL/min    Comment: (NOTE) Calculated using the CKD-EPI Creatinine Equation (2021)    Anion gap 10 5 - 15    Comment: Performed at [facility], [address] [place], [place] [address]  Ammonia     Status: Abnormal   Collection Time: 02/24/20  2:33 AM  Result Value Ref Range   Ammonia 53 (H) 9 - 35 umol/L    Comment: Performed at [facility], [address] [place], [place] [address]  CBC     Status: Abnormal   Collection Time: 02/24/20  2:33 AM  Result Value Ref Range   WBC 11.7 (H) 4.0 - 10.5 K/uL   RBC 5.85 (H) 4.22 - 5.81 MIL/uL   Hemoglobin 15.9 13.0 - 17.0 g/dL   HCT 52.1 (H) 39 - 52 %   MCV 89.1 80.0 - 100.0 fL   MCH 27.2 26.0 - 34.0 pg   MCHC 30.5 30.0 - 36.0 g/dL   RDW 19.9 (H) 11.5 - 15.5 %   Platelets 186 150 - 400 K/uL   nRBC 0.0 0.0 - 0.2 %    Comment: Performed at [facility], [address] [place], [place] [address]  Lactic acid, plasma     Status: None   Collection Time: 02/24/20  2:33 AM  Result Value Ref Range   Lactic Acid, Venous 0.9 0.5 - 1.9 mmol/L    Comment: Performed at [facility], [address] [place], [place] [address]  Glucose, capillary     Status: None   Collection Time: 02/24/20  8:51 AM  Result  Value Ref Range   Glucose-Capillary 78 70 - 99 mg/dL    Comment: Glucose reference range applies only to samples taken after fasting for at least 8 hours.  Blood gas, arterial     Status: Abnormal   Collection Time: 02/24/20  9:33 AM  Result Value Ref Range   FIO2 32.00    pH, Arterial 7.378 7.35 - 7.45  pCO2 arterial 77.6 (HH) 32 - 48 mmHg    Comment: CRITICAL RESULT CALLED TO, READ BACK BY AND VERIFIED WITH: HYLTON,L AT 9:40AM ON 02/24/20 BY FESTERMAN,C    pO2, Arterial 74.3 (L) 83 - 108 mmHg   Bicarbonate 38.5 (H) 20.0 - 28.0 mmol/L   Acid-Base Excess 18.4 (H) 0.0 - 2.0 mmol/L   O2 Saturation 93.0 %   Patient temperature 37.0    Allens test (pass/fail) PASS PASS    Comment: Performed at [facility], [address], [place], [place] [address]  Glucose, capillary     Status: None   Collection Time: 02/24/20 11:38 AM  Result Value Ref Range   Glucose-Capillary 80 70 - 99 mg/dL    Comment: Glucose reference range applies only to samples taken after fasting for at least 8 hours.  Glucose, capillary     Status: None   Collection Time: 02/24/20  4:31 PM  Result Value Ref Range   Glucose-Capillary 93 70 - 99 mg/dL    Comment: Glucose reference range applies only to samples taken after fasting for at least 8 hours.    Studies/Results:  EEG Technical aspects: This EEG study was done with scalp electrodes positioned according to the 10-20 International system of electrode placement. Electrical activity was acquired at a sampling rate of _0  and reviewed with a high frequency filter of _1  and a low frequency filter of _2 . EEG data were recorded continuously and digitally stored.   Description: No clear posterior dominant rhythm was seen.  EEG showed continuous generalized 3 to 6 Hz theta delta slowing.  Hyperventilation and photic stimulation were not performed.     ABNORMALITY -Continuous slow, generalized  IMPRESSION: This study is suggestive of moderate diffuse  encephalopathy, nonspecific etiology.  No seizures or epileptiform discharges were seen throughout the recording.     HEAD CT FINDINGS: Brain:  Significantly motion degraded examination, limiting evaluation.  Stable generalized cerebral atrophy.  New from the prior examination of 02/18/2020, there is small volume acute subarachnoid hemorrhage overlying the right parietal and frontal lobes (for instance as seen on series 10, image 10) (series 2, image 9).  Redemonstrated chronic cortical/subcortical infarct within the right parietal and posterosuperior right temporal lobes.  No acute demarcated cortical infarct is identified.  No evidence of intracranial mass.  No midline shift.  Vascular: No hyperdense vessel.  Skull: No fracture or focal suspicious osseous lesion identified.  Sinuses/Orbits: Visualized orbits show no acute finding. Mild ethmoid sinus mucosal thickening at the imaged levels.  IMPRESSION: Significantly motion degraded examination, limiting evaluation.  Small volume acute subarachnoid hemorrhage overlying the right parietal and frontal lobes, new as compared to the head CT of 02/18/2020.  Redemonstrated chronic cortical/subcortical infarcts within the right parietal and temporal lobes.  Stable generalized cerebral atrophy.    The head CT scan is reviewed in person. There is small subarachnoid hemorrhage noted in the posterior right frontal area seen in 2 3 clots. This is new with hyperdensity. There is also a right temporal encephalomalacia consistent with remote cortical infarct.     Murielle Stang A. [name], M.D.  Diplomate, Tax adviser of Psychiatry and Neurology ( Neurology). 02/24/2020, 8:12 PM

## 2020-02-24 NOTE — Progress Notes (Signed)
PT Cancellation Note  Patient Details Name: Antonio Ramirez MRN: [id number] DOB: 1960/09/23   Cancelled Treatment:    Reason Eval/Treat Not Completed: Medical issues which prohibited therapy.  Patient transferred to a higher level of care and will need new PT consult resume therapy when patient is medically stable.  Thank you.   7:48 AM, 02/24/20 Lonell Grandchild, MPT Physical Therapist with [facility] 336 [phone] office [phone] mobile phone

## 2020-02-24 NOTE — Progress Notes (Signed)
Rapid response called on patient.  Patient found having seizure by nurse.  Patient placed on non rebreather and rapid response called.    Respiratory, nursing staff, [facility], and MD present.  MD ordered ativan 2mg  given at [place]. Keppra 500 mg IV given at 0048.  Vitals at 0048 were 150/82 pulse 59 and 100% on NRB.  Patient continued seizure activity.  Another dose of Ativan 2mg  given per MD order and vitals were 160/72 pulse 58 and 97% on NRB.  Patient still unresponsive at this time. Patient taken down for STAT CT scan and transfer to ICU.

## 2020-02-24 NOTE — Progress Notes (Signed)
Patient family notified of patient being transferred to ICU bed 10.

## 2020-02-24 NOTE — Progress Notes (Signed)
Upon initial assessment this morning patient was only responsive to painful stimuli and could not follow commands. Throughout the day patient has slowly become more responsive but is still mute and orientation is questionable. Patient now opens eyes to voice and will occasionally follow simple commands but is otherwise lethargic and fully dependent on staff. Flexiseal inserted per order for initiation of lactulose enemas. Plan is to transfer patient to [facility] for continuous neuro evaluation.

## 2020-02-24 NOTE — Progress Notes (Signed)
PROGRESS NOTE    Antonio Ramirez  XNA:[id number] DOB: 23-Apr-1960 DOA: 02/14/2020 PCP: [name], DO    Brief Narrative:  59 year old male with a history of COPD, coronary artery disease, opiate dependence, presents with acute respiratory failure requiring intubation and mechanical ventilation.  Patient was found to have COPD exacerbation with elevated PCO2.  He was intubated on 11/2 extubated on 11/5.  Hospital course further complicated by development of seizures and change in mental status.  CT head showed small subarachnoid hemorrhage.  Neurology following and has been started on Keppra.  Despite antiepileptics, patient continues to have seizures.  Plan is to transfer to [facility] for continuous EEG monitoring.   Assessment & Plan:   Active Problems:   Acute respiratory failure with hypoxia (HCC)   COPD with acute exacerbation (HCC)   Opioid dependence ([place])   Depression   CAD (coronary artery disease)   Endotracheally intubated   Altered mental status   Seizure ([place])   Acute encephalopathy.   -CT head shows small subarachnoid hemorrhage which was not felt to be the cause of his mental status change -I suspect that lethargy is related to seizures/post ictal state as well as recent ativan -overall mental status has started to improve since seizure this morning, although not back to baseline -pCO2 is elevated, but pH is compensated indicating chronic process -Ammonia is mildly elevated at 53, unclear if clinically significant, will start on lactulose enemas -Continue to monitor  Seizure with concerns for status epilepticus -Appreciate neurology assistance -EEG did not show any epileptiform discharges x 2 -He had been started on loading dose of Keppra as well as maintenance dosing on 11/10 -Unfortunately, despite being on Keppra the patient had recurrent seizures overnight -Discussed with Dr. [name] and Vimpat has been added to his medication regimen -It  was felt that patient would need continuous EEG monitoring and transfer to [facility] -Case reviewed with Dr. Theda Sers, neurology at [facility] who will see the patient on transfer -Currently, patient does not appear to be actively seizing, mental status is slowly improving  Small subarachnoid hemorrhage -CT head reviewed with neurosurgery, Dr. Marcello Moores -It was not felt that surgical intervention was indicated at this time -Recommendations were to hold antiplatelet agents -Repeat CT head performed 11/11 showed that hemorrhage was stable  Acute on chronic hypoxic and hypercapnic respiratory failure -Felt to be related to COPD as well as opiate use -Followed by pulmonology -Intubated from 11/2-11/5 -Overall blood gas has improved -Respiratory acidosis is now compensated -Continue BiPAP nightly  Staph epi bacteremia -Transthoracic echocardiogram without evidence of endocarditis -Completed Ancef on 11/7  COPD exacerbation -Treated with steroids, bronchodilators -Overall respiratory status appears to be stable -Followed by pulmonology  Acute kidney injury -Baseline creatinine 1.5 -On admission, creatinine noted to be elevated at 1.8 -AKI has resolved with hydration  Chronic systolic congestive heart failure, EF of 45 to 50% -Volume status appears to be stable -Oral medications such as Imdur, losartan and oral metoprolol on hold due to mental status -Cardiology following  Tachyarrhythmias -Noted to have transient SVT versus A. fib with RVR -Cardiology did not recommend anticoagulation due to poor compliance and low A. fib burden as well as subarachnoid hemorrhage  Generalized weakness -Seen by physical therapy with recommendation for skilled nursing facility placement  DVT prophylaxis: Place and maintain sequential compression device Start: 02/20/20 1656 Place TED hose Start: 02/20/20 1656  Code Status: Full code Family Communication: Updated patient's POA Lisa over phone  11/12  Disposition Plan: Status is: Inpatient. Patient to be transferred to [facility] for further care  Remains inpatient appropriate because:Inpatient level of care appropriate due to severity of illness   Dispo: The patient is from: Home              Anticipated d/c is to: SNF              Anticipated d/c date is: 3 days              Patient currently is not medically stable to d/c.   Consultants:   Pulmonology  Neurology  Cardiology  Procedures:   ETT 11/2-11/5  Antimicrobials:      Subjective: Patient is sleeping on my arrival, briefly opens eyes to voice, but falls back asleep  Objective: Vitals:   02/24/20 0700 02/24/20 0800 02/24/20 0825 02/24/20 0900  BP: (!) 166/89 (!) 158/85  (!) 163/74  Pulse: 65 65  63  Resp: (!) 23 (!) 26  (!) 21  Temp: 98.6 F (37 C)     TempSrc:      SpO2: 95% 94% 92% 91%  Weight:      Height:        Intake/Output Summary (Last 24 hours) at 02/24/2020 1019 Last data filed at 02/24/2020 0300 Gross per 24 hour  Intake 706.18 ml  Output 2300 ml  Net -1593.82 ml   Filed Weights   02/16/20 0343 02/17/20 0630 02/19/20 0500  Weight: 86.3 kg 86.2 kg 82.5 kg    Examination:  General exam: somnolent Respiratory system: bilateral rhonchi. Respiratory effort normal. Cardiovascular system:RRR. No murmurs, rubs, gallops. Gastrointestinal system: Abdomen is nondistended, soft and nontender. No organomegaly or masses felt. Normal bowel sounds heard. Central nervous system: somnolent, does not follow commands. Right side is weaker than left Extremities: no edema bilaterally Skin: No rashes, lesions or ulcers Psychiatry: somnolent    Data Reviewed: I have personally reviewed following labs and imaging studies  CBC: Recent Labs  Lab 02/19/20 0439 02/21/20 0418 02/22/20 0432 02/23/20 0408 02/24/20 0233  WBC 13.4* 13.5* 10.3 11.2* 11.7*  HGB 16.7 16.8 16.2 16.0 15.9  HCT 56.1* 53.7* 52.4* 50.8 52.1*  MCV 92.4 87.7 87.5 87.0 89.1   PLT 167 192 191 197 329   Basic Metabolic Panel: Recent Labs  Lab 02/19/20 0439 02/21/20 0418 02/22/20 0432 02/23/20 0408 02/24/20 0233  NA 147* 144 139 142 141  K 3.8 3.4* 3.0* 2.4* 3.1*  CL 105 91* 89* 91* 93*  CO2 33* 41* 41* 41* 38*  GLUCOSE 107* 80 103* 95 98  BUN 29* _0 CREATININE 0.76 0.53* 0.55* 0.63 0.73  CALCIUM 9.9 9.7 9.6 9.6 9.5  MG  --   --  1.8 1.9  --    GFR: Estimated Creatinine Clearance: 111 mL/min (by C-G formula based on SCr of 0.73 mg/dL). Liver Function Tests: Recent Labs  Lab 02/19/20 0439 02/21/20 0418 02/23/20 0408 02/24/20 0233  AST 426* 82* 48* 42*  ALT 175* 50* 47* 42  ALKPHOS 89 78 66 62  BILITOT 1.5* 2.4* 2.3* 2.2*  PROT 6.2* 6.1* 5.8* 5.7*  ALBUMIN 3.2* 3.1* 3.0* 2.8*   No results for input(s): LIPASE, AMYLASE in the last 168 hours. Recent Labs  Lab 02/19/20 0735 02/24/20 0233  AMMONIA 26 53*   Coagulation Profile: No results for input(s): INR, PROTIME in the last 168 hours. Cardiac Enzymes: No results for input(s): CKTOTAL, CKMB, CKMBINDEX, TROPONINI in the last 168 hours. BNP (last 3  results) No results for input(s): PROBNP in the last 8760 hours. HbA1C: No results for input(s): HGBA1C in the last 72 hours. CBG: Recent Labs  Lab 02/23/20 1626 02/23/20 2042 02/24/20 0003 02/24/20 0040 02/24/20 0851  GLUCAP 84 80 99 92 78   Lipid Profile: No results for input(s): CHOL, HDL, LDLCALC, TRIG, CHOLHDL, LDLDIRECT in the last 72 hours. Thyroid Function Tests: No results for input(s): TSH, T4TOTAL, FREET4, T3FREE, THYROIDAB in the last 72 hours. Anemia Panel: No results for input(s): VITAMINB12, FOLATE, FERRITIN, TIBC, IRON, RETICCTPCT in the last 72 hours. Sepsis Labs: Recent Labs  Lab 02/24/20 0233  LATICACIDVEN 0.9    Recent Results (from the past 240 hour(s))  Culture, Urine     Status: None   Collection Time: 02/14/20  9:26 PM   Specimen: Urine, Clean Catch  Result Value Ref Range Status   Specimen  Description   Final    URINE, CLEAN CATCH Performed at [facility], [address] [place], [place] [address]    Special Requests   Final    NONE Performed at [facility], [address], [place], [place] [address]    Culture   Final    NO GROWTH Performed at [facility], [place] [address] [place], [place] [address]    Report Status 02/16/2020 FINAL  Final  Culture, blood (routine x 2)     Status: None   Collection Time: 02/14/20  9:44 PM   Specimen: BLOOD RIGHT HAND  Result Value Ref Range Status   Specimen Description BLOOD RIGHT HAND  Final   Special Requests   Final    BOTTLES DRAWN AEROBIC ONLY Blood Culture adequate volume   Culture   Final    NO GROWTH 5 DAYS Performed at [facility], [address] [place], [place] [address]    Report Status 02/19/2020 FINAL  Final  Culture, blood (routine x 2)     Status: None   Collection Time: 02/14/20  9:58 PM   Specimen: BLOOD LEFT HAND  Result Value Ref Range Status   Specimen Description BLOOD LEFT HAND  Final   Special Requests   Final    BOTTLES DRAWN AEROBIC ONLY Blood Culture adequate volume   Culture   Final    NO GROWTH 5 DAYS Performed at [facility], [address], [place], [place] [address]    Report Status 02/19/2020 FINAL  Final  MRSA PCR Screening     Status: None   Collection Time: 02/15/20 12:05 AM   Specimen: Nasal Mucosa; Nasopharyngeal  Result Value Ref Range Status   MRSA by PCR NEGATIVE NEGATIVE Final    Comment:        The GeneXpert MRSA Assay (FDA approved for NASAL specimens only), is one component of a comprehensive MRSA colonization surveillance program. It is not intended to diagnose MRSA infection nor to guide or monitor treatment for MRSA infections. Performed at [facility], [address] [place], [place] [address]   Culture, blood (Routine X 2) w Reflex to ID Panel     Status: None   Collection Time: 02/16/20  2:33 PM   Specimen: BLOOD RIGHT HAND  Result Value Ref Range  Status   Specimen Description BLOOD RIGHT HAND  Final   Special Requests   Final    BOTTLES DRAWN AEROBIC AND ANAEROBIC Blood Culture adequate volume   Culture   Final    NO GROWTH 5 DAYS Performed at [facility], [address], [place], [place] [address]  Report Status 02/21/2020 FINAL  Final  Culture, blood (Routine X 2) w Reflex to ID Panel     Status: None   Collection Time: 02/16/20  2:33 PM   Specimen: BLOOD LEFT HAND  Result Value Ref Range Status   Specimen Description BLOOD LEFT HAND  Final   Special Requests   Final    BOTTLES DRAWN AEROBIC AND ANAEROBIC Blood Culture results may not be optimal due to an inadequate volume of blood received in culture bottles   Culture   Final    NO GROWTH 5 DAYS Performed at [facility], [address], [place], [place] [address]    Report Status 02/21/2020 FINAL  Final         Radiology Studies: EEG  Result Date: 02/23/2020 Phillips Odor, MD     02/23/2020  6:25 PM [place] A. [name], MD     [email or web address]       HISTORY: The patient presents with recurrent seizures. MEDICATIONS: Current Facility-Administered Medications: .  acetaminophen (TYLENOL) tablet 650 mg, 650 mg, Oral, Q6H PRN **OR** acetaminophen (TYLENOL) suppository 650 mg, 650 mg, Rectal, Q6H PRN, Emokpae, Ejiroghene E, MD, 650 mg at 02/23/20 0443 .  albuterol (PROVENTIL) (2.5 MG/3ML) 0.083% nebulizer solution 2.5 mg, 2.5 mg, Nebulization, Q4H PRN, Emokpae, Courage, MD .  chlorhexidine gluconate (MEDLINE KIT) (PERIDEX) 0.12 % solution 15 mL, 15 mL, Mouth Rinse, BID, Emokpae, Ejiroghene E, MD, 15 mL at 02/23/20 1111 .  Chlorhexidine Gluconate Cloth 2 % PADS 6 each, 6 each, Topical, Daily, Anders Simmonds, MD, 6 each at 02/23/20 1112 .  dextrose 5 %-0.45 % sodium chloride infusion, , Intravenous, Continuous, Emokpae, Courage, MD, Last Rate: 20 mL/hr at 02/23/20 1250, New Bag at 02/23/20 1250 .  diphenhydrAMINE (BENADRYL) injection 25 mg, 25 mg,  Intravenous, Q6H PRN, Emokpae, Courage, MD .  famotidine (PEPCID) IVPB 20 mg premix, 20 mg, Intravenous, Q12H, Emokpae, Ejiroghene E, MD, Last Rate: 100 mL/hr at 02/23/20 1112, 20 mg at 02/23/20 1112 .  feeding supplement (ENSURE ENLIVE / ENSURE PLUS) liquid 237 mL, 237 mL, Oral, TID BM, Emokpae, Courage, MD, 237 mL at 02/21/20 2147 .  influenza vac split quadrivalent PF (FLUARIX) injection 0.5 mL, 0.5 mL, Intramuscular, Tomorrow-1000, Emokpae, Ejiroghene E, MD .  insulin aspart (novoLOG) injection 0-5 Units, 0-5 Units, Subcutaneous, QHS, Emokpae, Courage, MD .  insulin aspart (novoLOG) injection 0-6 Units, 0-6 Units, Subcutaneous, TID WC, Emokpae, Courage, MD .  ipratropium-albuterol (DUONEB) 0.5-2.5 (3) MG/3ML nebulizer solution 3 mL, 3 mL, Nebulization, Q6H, Emokpae, Courage, MD, 3 mL at 02/23/20 1311 .  isosorbide mononitrate (IMDUR) 24 hr tablet 30 mg, 30 mg, Oral, Daily, Emokpae, Courage, MD .  levETIRAcetam (KEPPRA) IVPB 500 mg/100 mL premix, 500 mg, Intravenous, Q8H, [place], Kofi, MD, Last Rate: 400 mL/hr at 02/23/20 1117, 500 mg at 02/23/20 1117 .  LORazepam (ATIVAN) injection 0.5 mg, 0.5 mg, Intravenous, Q8H PRN, Emokpae, Courage, MD, 0.5 mg at 02/23/20 0114 .  losartan (COZAAR) tablet 25 mg, 25 mg, Oral, Daily, Satira Sark, MD, 25 mg at 02/21/20 0906 .  MEDLINE mouth rinse, 15 mL, Mouth Rinse, BID, Emokpae, Courage, MD, 15 mL at 02/23/20 1115 .  metoprolol tartrate (LOPRESSOR) tablet 37.5 mg, 37.5 mg, Oral, BID, [place], Alphonse Guild, MD .  multivitamin with minerals tablet 1 tablet, 1 tablet, Oral, Daily, Emokpae, Courage, MD, 1 tablet at 02/21/20 1700 .  ondansetron (ZOFRAN) tablet 4 mg, 4 mg, Oral, Q6H PRN **OR** ondansetron (ZOFRAN) injection 4 mg, 4 mg,  Intravenous, Q6H PRN, Emokpae, Ejiroghene E, MD .  oxyCODONE (Oxy IR/ROXICODONE) immediate release tablet 10 mg, 10 mg, Oral, Q6H PRN, Emokpae, Courage, MD .  pneumococcal 23 valent vaccine (PNEUMOVAX-23) injection 0.5 mL, 0.5 mL, Intramuscular,  Tomorrow-1000, Emokpae, Ejiroghene E, MD .  potassium chloride (KLOR-CON) packet 40 mEq, 40 mEq, Oral, Once, Zierle-Ghosh, Asia B, DO .  senna-docusate (Senokot-S) tablet 2 tablet, 2 tablet, Oral, BID, Emokpae, Courage, MD ANALYSIS: A 16 channel recording using standard 10 20 measurements is conducted for 26 minutes.  There is significant myogenic artifact seen throughout the recording.  The background activity gets as high as 6 to 7 Hz.  Photic stimulation and hyperventilation are not conducted.  There is no focal or lateral slowing.  There is no epileptiform activity is noted. IMPRESSION: 1.  The recording shows a mild to moderate global slowing indicating a mild to moderate global encephalopathy.  No epileptiform activities are noted. Kofi A. [name], M.D. Diplomate, Tax adviser of Psychiatry and Neurology ( Neurology).   EEG  Result Date: 02/22/2020 Lora Havens, MD     02/22/2020 12:34 PM Patient Name: Antonio Ramirez MRN: [id number] Epilepsy Attending: Lora Havens Referring Physician/Provider: Dr Kathie Dike Date: 02/22/2020 Duration: 26.13 mins Patient history: 59 year old male with chronic right parietal and temporal strokes who presented with altered mental status.  EEG to evaluate for seizures. Level of alertness: Awake AEDs during EEG study: None Technical aspects: This EEG study was done with scalp electrodes positioned according to the 10-20 International system of electrode placement. Electrical activity was acquired at a sampling rate of $Remov'500Hz'kTpnsw$  and reviewed with a high frequency filter of $RemoveB'70Hz'AISGsOvV$  and a low frequency filter of $RemoveB'1Hz'wOyxUoZi$ . EEG data were recorded continuously and digitally stored. Description: No clear posterior dominant rhythm was seen.  EEG showed continuous generalized 3 to 6 Hz theta delta slowing.  Hyperventilation and photic stimulation were not performed.   ABNORMALITY -Continuous slow, generalized IMPRESSION: This study is suggestive of moderate diffuse  encephalopathy, nonspecific etiology. No seizures or epileptiform discharges were seen throughout the recording. Lora Havens   CT HEAD WO CONTRAST  Result Date: 02/24/2020 CLINICAL DATA:  Seizure EXAM: CT HEAD WITHOUT CONTRAST TECHNIQUE: Contiguous axial images were obtained from the base of the skull through the vertex without intravenous contrast. 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 Small volume subarachnoid hemorrhage involving the right frontal lobe and within the right parietal region within the central sulcus at the vertex is stable since prior examination. No interval hemorrhage. No abnormal mass effect or midline shift. Parenchymal volume loss is commensurate with the patient's age and is stable since prior examination. Mild periventricular white matter changes are present likely reflecting the sequela of small vessel ischemia. Remote small right cerebellar infarct again noted. No evidence of acute infarct. Ventricular size is normal. No abnormal intra or extra-axial mass lesion. Vascular: No asymmetric hyperdense vasculature at the skull base. Skull: Normal. Negative for fracture or focal lesion. Sinuses/Orbits: No acute finding. Other: Mastoid air cells and middle ear cavities are clear. IMPRESSION: Stable small volume frontal and parietal subarachnoid hemorrhage. Unchanged right temporoparietal encephalomalacia remained to remote cortical infarct. No evidence of acute infarct. No abnormal mass effect or midline shift. Electronically Signed   By: Fidela Salisbury MD   On: 02/24/2020 01:21   CT HEAD WO CONTRAST  Result Date: 02/23/2020 CLINICAL DATA:  Recent intracranial hemorrhage EXAM: CT HEAD WITHOUT CONTRAST TECHNIQUE: Contiguous axial  images were obtained from the base of the skull through the vertex without intravenous contrast. COMPARISON:  February 22, 2020 FINDINGS: Brain: There is  essentially stable hemorrhage in the right parietal lobe region. No new foci of hemorrhage are evident. There is no mass, extra-axial fluid collection, or midline shift. Mild to moderate underlying atrophy is stable. There is evidence of a prior infarct involving portions of the superior, posterior right temporal and right temporal-parietal-occipital junction region. Suspect acute component to this infarct along its more superomedial aspect. Appearance stable in this area. Elsewhere there is mild small vessel disease in the periventricular white matter, stable. No new focus of infarction evident. Vascular: No appreciable hyperdense vessel. There is calcification in the right carotid siphon region. Skull: Bony calvarium appears intact. Sinuses/Orbits: Small retention cyst noted in the anterior left sphenoid sinus. There is mild mucosal thickening in several ethmoid air cells. Visualized orbits appear symmetric bilaterally. Other: Mastoid air cells are clear. IMPRESSION: 1. Stable subarachnoid hemorrhage in the right anterior mid parietal lobe regions. No new focus or progression of hemorrhage. 2. Stable infarct involving portions of the right temporal lobe as well as the right parietal-occipital junction with suspected more acute component along the superomedial aspect of this infarct, stable. No new infarct evident. 3.  Stable atrophy with mild periventricular small vessel disease. 4.  Foci of arterial vascular calcification. 5.  Mild paranasal sinus disease. Electronically Signed   By: Lowella Grip III M.D.   On: 02/23/2020 11:35        Scheduled Meds: . chlorhexidine gluconate (MEDLINE KIT)  15 mL Mouth Rinse BID  . Chlorhexidine Gluconate Cloth  6 each Topical Daily  . feeding supplement  237 mL Oral TID BM  . influenza vac split quadrivalent PF  0.5 mL Intramuscular Tomorrow-1000  . insulin aspart  0-5 Units Subcutaneous QHS  . insulin aspart  0-6 Units Subcutaneous TID WC  .  ipratropium-albuterol  3 mL Nebulization Q6H  . LORazepam      . mouth rinse  15 mL Mouth Rinse BID  . metoprolol tartrate  5 mg Intravenous Q6H  . multivitamin with minerals  1 tablet Oral Daily  . pneumococcal 23 valent vaccine  0.5 mL Intramuscular Tomorrow-1000  . potassium chloride  40 mEq Oral Once  . senna-docusate  2 tablet Oral BID   Continuous Infusions: . dextrose 5 % and 0.45% NaCl 20 mL/hr at 02/24/20 0300  . famotidine (PEPCID) IV Stopped (02/23/20 2118)  . lacosamide (VIMPAT) IV    . levETIRAcetam    . potassium chloride       LOS: 10 days    Time spent: 40 minutes   Kathie Dike, MD [facility]   If 7PM-7AM, please contact night-coverage [email or web address]  02/24/2020, 10:19 AM

## 2020-02-25 ENCOUNTER — Encounter (HOSPITAL_COMMUNITY): Payer: Self-pay | Admitting: Internal Medicine

## 2020-02-25 ENCOUNTER — Encounter (HOSPITAL_COMMUNITY): Payer: Medicare Other

## 2020-02-25 DIAGNOSIS — G40901 Epilepsy, unspecified, not intractable, with status epilepticus: Secondary | ICD-10-CM

## 2020-02-25 DIAGNOSIS — F1129 Opioid dependence with unspecified opioid-induced disorder: Secondary | ICD-10-CM | POA: Diagnosis not present

## 2020-02-25 DIAGNOSIS — J441 Chronic obstructive pulmonary disease with (acute) exacerbation: Secondary | ICD-10-CM | POA: Diagnosis not present

## 2020-02-25 DIAGNOSIS — I609 Nontraumatic subarachnoid hemorrhage, unspecified: Secondary | ICD-10-CM

## 2020-02-25 DIAGNOSIS — J9602 Acute respiratory failure with hypercapnia: Secondary | ICD-10-CM | POA: Diagnosis not present

## 2020-02-25 DIAGNOSIS — G9340 Encephalopathy, unspecified: Secondary | ICD-10-CM

## 2020-02-25 DIAGNOSIS — I6930 Unspecified sequelae of cerebral infarction: Secondary | ICD-10-CM

## 2020-02-25 LAB — GLUCOSE, CAPILLARY
Glucose-Capillary: 112 mg/dL — ABNORMAL HIGH (ref 70–99)
Glucose-Capillary: 115 mg/dL — ABNORMAL HIGH (ref 70–99)
Glucose-Capillary: 104 mg/dL — ABNORMAL HIGH (ref 70–99)
Glucose-Capillary: 113 mg/dL — ABNORMAL HIGH (ref 70–99)
Glucose-Capillary: 106 mg/dL — ABNORMAL HIGH (ref 70–99)

## 2020-02-25 MED ORDER — POTASSIUM CHLORIDE 10 MEQ/100ML IV SOLN
10.0000 meq | INTRAVENOUS | Status: AC
  Administered 2020-02-25 (×6): 10 meq via INTRAVENOUS
  Filled 2020-02-25 (×6): qty 100

## 2020-02-25 MED ORDER — LEVALBUTEROL HCL 1.25 MG/0.5ML IN NEBU
1.2500 mg | INHALATION_SOLUTION | Freq: Four times a day (QID) | RESPIRATORY_TRACT | Status: DC
  Administered 2020-02-25 – 2020-02-27 (×6): 1.25 mg via RESPIRATORY_TRACT
  Filled 2020-02-25 (×9): qty 0.5

## 2020-02-25 MED ORDER — LEVALBUTEROL HCL 1.25 MG/0.5ML IN NEBU
1.2500 mg | INHALATION_SOLUTION | Freq: Four times a day (QID) | RESPIRATORY_TRACT | Status: DC
  Filled 2020-02-25 (×3): qty 0.5

## 2020-02-25 MED ORDER — METHYLPREDNISOLONE SODIUM SUCC 125 MG IJ SOLR
60.0000 mg | INTRAMUSCULAR | Status: AC
  Administered 2020-02-25 – 2020-02-29 (×5): 60 mg via INTRAVENOUS
  Filled 2020-02-25 (×5): qty 2

## 2020-02-25 NOTE — Progress Notes (Signed)
Report obtained from [place] at [facility] ICU

## 2020-02-25 NOTE — Progress Notes (Signed)
PROGRESS NOTE    Antonio Ramirez  BMW:[id number] DOB: Aug 17, 1960 DOA: 02/14/2020 PCP: [name], DO     Brief Narrative:  59 year old WM PMHx  COPD, coronary artery disease, opiate dependence, presents with acute respiratory failure requiring intubation and mechanical ventilation.  Patient was found to have COPD exacerbation with elevated PCO2.  He was intubated on 11/2 extubated on 11/5.  Hospital course further complicated by development of seizures and change in mental status.  CT head showed small subarachnoid hemorrhage.  Neurology following and has been started on Keppra.  Despite antiepileptics, patient continues to have seizures.  Plan is to transfer to [facility] for continuous EEG monitoring.   Subjective: This somnolent difficult to arouse, babbles incoherently.  Does not follow commands.  Withdraws bilateral lower extremities to painful stimuli.  Sluggish blink reflex.  Assessment & Plan: Covid vaccination;   Active Problems:   Acute respiratory failure with hypoxia (HCC)   COPD with acute exacerbation (HCC)   Opioid dependence ([place])   Depression   CAD (coronary artery disease)   Endotracheally intubated   Altered mental status   Seizure ([place])   Acute encephalopathy.   -CT head shows small subarachnoid hemorrhage which was not felt to be the cause of his mental status change -I suspect that lethargy is related to seizures/post ictal state as well as recent ativan -overall mental status has started to improve since seizure this morning, although not back to baseline -pCO2 is elevated, but pH is compensated indicating chronic process -Ammonia is mildly elevated at 53, unclear if clinically significant, will start on lactulose enemas -Continue to monitor  Seizure with concerns for status epilepticus -Appreciate neurology assistance -EEG did not show any epileptiform discharges x 2 -He had been started on loading dose of Keppra as well as  maintenance dosing on 11/10 -Unfortunately, despite being on Keppra the patient had recurrent seizures overnight -Discussed with Dr. [name] and Vimpat has been added to his medication regimen -It was felt that patient would need continuous EEG monitoring and transfer to [facility] -Case reviewed with Dr. Theda Sers, neurology at [facility] who will see the patient on transfer -Currently, patient does not appear to be actively seizing, mental status is slowly improving -11/13 spoke with Dr. Jeanella Craze neurology who will see patient  Small subarachnoid hemorrhage -CT head reviewed with neurosurgery, Dr. Marcello Moores -It was not felt that surgical intervention was indicated at this time -Recommendations were to hold antiplatelet agents -Repeat CT head performed 11/11 showed that hemorrhage was stable  Chronic systolic congestive heart failure, EF of 45 to 50% -Strict in and out -Daily weight -Metoprolol IV 5 mg QID -Hold all other BP medication  Tachyarrhythmias  -Noted to have transient SVT versus A. fib with RVR -Cardiology did not recommend anticoagulation due to poor compliance and low A. fib burden as well as subarachnoid hemorrhage- -11/13 currently NSR  Acute on chronic hypoxic and hypercapnic respiratory failure -Felt to be related to COPD as well as opiate use -Followed by pulmonology -Intubated from 11/2-11/5 -Overall blood gas has improved -Respiratory acidosis is now compensated -Continue BiPAP nightly -Goal SPO2; 88 to 95% -Xopenex QID -Solu-Medrol 60 mg daily  COPD exacerbation -Treated with steroids, bronchodilators -Followed by pulmonology  Staph epi bacteremia -Transthoracic echocardiogram without evidence of endocarditis -Completed Ancef on 11/7   AKI (baseline Cr 1.5) Lab Results  Component Value Date   CREATININE 0.73 02/24/2020   CREATININE 0.63 02/23/2020   CREATININE 0.55 (L) 02/22/2020  CREATININE 0.53 (L) 02/21/2020   CREATININE 0.76  02/19/2020  -Better than baseline -Baseline creatinine 1.5 -On admission, creatinine noted to be elevated at 1.8 -AKI has resolved with hydration   Generalized weakness -Seen by physical therapy with recommendation for skilled nursing facility placement  Hypokalemia -Potassium goal> 4 -11/13 potassium IV 60 mEq   DVT prophylaxis: SCD secondary subarachnoid hemorrhage Code Status: Full Family Communication:  Status is: Inpatient    Dispo: The patient is from: Transfer from [facility]              Anticipated d/c is to:??              Anticipated d/c date is:??  Awaiting neurology evaluation              Patient currently unstable      Consultants:  Cardiology PCCM Neurology  Procedures/Significant Events:    I have personally reviewed and interpreted all radiology studies and my findings are as above.  VENTILATOR SETTINGS:    Cultures   Antimicrobials: Anti-infectives (From admission, onward)   Start     Ordered Stop   02/15/20 2200  vancomycin (VANCOCIN) IVPB 1000 mg/200 mL premix  Status:  Discontinued        02/14/20 2149 02/15/20 1044   02/15/20 1130  ceFAZolin (ANCEF) IVPB 2g/100 mL premix        02/15/20 1044 02/20/20 0133   02/14/20 2200  metroNIDAZOLE (FLAGYL) IVPB 500 mg  Status:  Discontinued        02/14/20 2136 02/15/20 1433   02/14/20 2200  vancomycin (VANCOREADY) IVPB 1250 mg/250 mL        02/14/20 2148 02/15/20 0128   02/14/20 2200  ceFEPIme (MAXIPIME) 2 g in sodium chloride 0.9 % 100 mL IVPB  Status:  Discontinued        02/14/20 2148 02/15/20 1044       Devices    LINES / TUBES:      Continuous Infusions: . dextrose 5 % and 0.45% NaCl 20 mL/hr at 02/25/20 1356  . famotidine (PEPCID) IV 20 mg (02/25/20 0910)  . lacosamide (VIMPAT) IV 200 mg (02/25/20 1129)  . levETIRAcetam 1,000 mg (02/25/20 0949)     Objective: Vitals:   02/25/20 1342 02/25/20 1500 02/25/20 1513 02/25/20 1536  BP: (!) 159/122 (!) 147/124  (!) 148/96   Pulse: 69 75 88   Resp: 18 (!) 21 20   Temp:      TempSrc:      SpO2:  93%  90%  Weight:      Height:        Intake/Output Summary (Last 24 hours) at 02/25/2020 1600 Last data filed at 02/25/2020 1137 Gross per 24 hour  Intake 570.81 ml  Output 2000 ml  Net -1429.19 ml   Filed Weights   02/16/20 0343 02/17/20 0630 02/19/20 0500  Weight: 86.3 kg 86.2 kg 82.5 kg    Examination:  General: Somnolent difficult to arouse, positive acute on chronic respiratory distress, cachectic Eyes: negative scleral hemorrhage, negative anisocoria, negative icterus ENT: Negative Runny nose, negative gingival bleeding, Neck:  Negative scars, masses, torticollis, lymphadenopathy, JVD Lungs: Clear to auscultation bilaterally without wheezes or crackles Cardiovascular: Regular rate and rhythm without murmur gallop or rub normal S1 and S2 Abdomen: negative abdominal pain, nondistended, positive soft, bowel sounds, no rebound, no ascites, no appreciable mass Extremities: No significant cyanosis, clubbing, or edema bilateral lower extremities Skin: Negative rashes, lesions, ulcers Psychiatric: Unable to assess Central  nervous system: Withdraws to painful stimuli mildly, poor blink reflex .     Data Reviewed: Care during the described time interval was provided by me .  I have reviewed this patient's available data, including medical history, events of note, physical examination, and all test results as part of my evaluation.  CBC: Recent Labs  Lab 02/19/20 0439 02/21/20 0418 02/22/20 0432 02/23/20 0408 02/24/20 0233  WBC 13.4* 13.5* 10.3 11.2* 11.7*  HGB 16.7 16.8 16.2 16.0 15.9  HCT 56.1* 53.7* 52.4* 50.8 52.1*  MCV 92.4 87.7 87.5 87.0 89.1  PLT 167 192 191 197 683   Basic Metabolic Panel: Recent Labs  Lab 02/19/20 0439 02/21/20 0418 02/22/20 0432 02/23/20 0408 02/24/20 0233  NA 147* 144 139 142 141  K 3.8 3.4* 3.0* 2.4* 3.1*  CL 105 91* 89* 91* 93*  CO2 33* 41*  41* 41* 38*  GLUCOSE 107* 80 103* 95 98  BUN 29* '14 15 16 17  ' CREATININE 0.76 0.53* 0.55* 0.63 0.73  CALCIUM 9.9 9.7 9.6 9.6 9.5  MG  --   --  1.8 1.9  --    GFR: Estimated Creatinine Clearance: 111 mL/min (by C-G formula based on SCr of 0.73 mg/dL). Liver Function Tests: Recent Labs  Lab 02/19/20 0439 02/21/20 0418 02/23/20 0408 02/24/20 0233  AST 426* 82* 48* 42*  ALT 175* 50* 47* 42  ALKPHOS 89 78 66 62  BILITOT 1.5* 2.4* 2.3* 2.2*  PROT 6.2* 6.1* 5.8* 5.7*  ALBUMIN 3.2* 3.1* 3.0* 2.8*   No results for input(s): LIPASE, AMYLASE in the last 168 hours. Recent Labs  Lab 02/19/20 0735 02/24/20 0233  AMMONIA 26 53*   Coagulation Profile: No results for input(s): INR, PROTIME in the last 168 hours. Cardiac Enzymes: No results for input(s): CKTOTAL, CKMB, CKMBINDEX, TROPONINI in the last 168 hours. BNP (last 3 results) No results for input(s): PROBNP in the last 8760 hours. HbA1C: No results for input(s): HGBA1C in the last 72 hours. CBG: Recent Labs  Lab 02/24/20 1631 02/24/20 2018 02/25/20 0820 02/25/20 1205 02/25/20 1240  GLUCAP 93 98 112* 115* 104*   Lipid Profile: No results for input(s): CHOL, HDL, LDLCALC, TRIG, CHOLHDL, LDLDIRECT in the last 72 hours. Thyroid Function Tests: No results for input(s): TSH, T4TOTAL, FREET4, T3FREE, THYROIDAB in the last 72 hours. Anemia Panel: No results for input(s): VITAMINB12, FOLATE, FERRITIN, TIBC, IRON, RETICCTPCT in the last 72 hours. Sepsis Labs: Recent Labs  Lab 02/24/20 0233  LATICACIDVEN 0.9    Recent Results (from the past 240 hour(s))  Culture, blood (Routine X 2) w Reflex to ID Panel     Status: None   Collection Time: 02/16/20  2:33 PM   Specimen: BLOOD RIGHT HAND  Result Value Ref Range Status   Specimen Description BLOOD RIGHT HAND  Final   Special Requests   Final    BOTTLES DRAWN AEROBIC AND ANAEROBIC Blood Culture adequate volume   Culture   Final    NO GROWTH 5 DAYS Performed at [facility], [address] [place],  [address]    Report Status 02/21/2020 FINAL  Final  Culture, blood (Routine X 2) w Reflex to ID Panel     Status: None   Collection Time: 02/16/20  2:33 PM   Specimen: BLOOD LEFT HAND  Result Value Ref Range Status   Specimen Description BLOOD LEFT HAND  Final   Special Requests   Final    BOTTLES DRAWN AEROBIC AND ANAEROBIC Blood Culture results  may not be optimal due to an inadequate volume of blood received in culture bottles   Culture   Final    NO GROWTH 5 DAYS Performed at [facility], [address], [place], [place] [address]    Report Status 02/21/2020 FINAL  Final         Radiology Studies: EEG  Result Date: 02/23/2020 Phillips Odor, MD     02/23/2020  6:25 PM Renick A. [name], MD     [email or web address]       HISTORY: The patient presents with recurrent seizures. MEDICATIONS: Current Facility-Administered Medications: .  acetaminophen (TYLENOL) tablet 650 mg, 650 mg, Oral, Q6H PRN **OR** acetaminophen (TYLENOL) suppository 650 mg, 650 mg, Rectal, Q6H PRN, Emokpae, Ejiroghene E, MD, 650 mg at 02/23/20 0443 .  albuterol (PROVENTIL) (2.5 MG/3ML) 0.083% nebulizer solution 2.5 mg, 2.5 mg, Nebulization, Q4H PRN, Emokpae, Courage, MD .  chlorhexidine gluconate (MEDLINE KIT) (PERIDEX) 0.12 % solution 15 mL, 15 mL, Mouth Rinse, BID, Emokpae, Ejiroghene E, MD, 15 mL at 02/23/20 1111 .  Chlorhexidine Gluconate Cloth 2 % PADS 6 each, 6 each, Topical, Daily, Anders Simmonds, MD, 6 each at 02/23/20 1112 .  dextrose 5 %-0.45 % sodium chloride infusion, , Intravenous, Continuous, Emokpae, Courage, MD, Last Rate: 20 mL/hr at 02/23/20 1250, New Bag at 02/23/20 1250 .  diphenhydrAMINE (BENADRYL) injection 25 mg, 25 mg, Intravenous, Q6H PRN, Emokpae, Courage, MD .  famotidine (PEPCID) IVPB 20 mg premix, 20 mg, Intravenous, Q12H, Emokpae, Ejiroghene E, MD, Last Rate: 100 mL/hr at 02/23/20 1112, 20 mg at 02/23/20 1112 .  feeding supplement  (ENSURE ENLIVE / ENSURE PLUS) liquid 237 mL, 237 mL, Oral, TID BM, Emokpae, Courage, MD, 237 mL at 02/21/20 2147 .  influenza vac split quadrivalent PF (FLUARIX) injection 0.5 mL, 0.5 mL, Intramuscular, Tomorrow-1000, Emokpae, Ejiroghene E, MD .  insulin aspart (novoLOG) injection 0-5 Units, 0-5 Units, Subcutaneous, QHS, Emokpae, Courage, MD .  insulin aspart (novoLOG) injection 0-6 Units, 0-6 Units, Subcutaneous, TID WC, Emokpae, Courage, MD .  ipratropium-albuterol (DUONEB) 0.5-2.5 (3) MG/3ML nebulizer solution 3 mL, 3 mL, Nebulization, Q6H, Emokpae, Courage, MD, 3 mL at 02/23/20 1311 .  isosorbide mononitrate (IMDUR) 24 hr tablet 30 mg, 30 mg, Oral, Daily, Emokpae, Courage, MD .  levETIRAcetam (KEPPRA) IVPB 500 mg/100 mL premix, 500 mg, Intravenous, Q8H, [place], Kofi, MD, Last Rate: 400 mL/hr at 02/23/20 1117, 500 mg at 02/23/20 1117 .  LORazepam (ATIVAN) injection 0.5 mg, 0.5 mg, Intravenous, Q8H PRN, Emokpae, Courage, MD, 0.5 mg at 02/23/20 0114 .  losartan (COZAAR) tablet 25 mg, 25 mg, Oral, Daily, Satira Sark, MD, 25 mg at 02/21/20 0906 .  MEDLINE mouth rinse, 15 mL, Mouth Rinse, BID, Emokpae, Courage, MD, 15 mL at 02/23/20 1115 .  metoprolol tartrate (LOPRESSOR) tablet 37.5 mg, 37.5 mg, Oral, BID, [place], Alphonse Guild, MD .  multivitamin with minerals tablet 1 tablet, 1 tablet, Oral, Daily, Emokpae, Courage, MD, 1 tablet at 02/21/20 1700 .  ondansetron (ZOFRAN) tablet 4 mg, 4 mg, Oral, Q6H PRN **OR** ondansetron (ZOFRAN) injection 4 mg, 4 mg, Intravenous, Q6H PRN, Emokpae, Ejiroghene E, MD .  oxyCODONE (Oxy IR/ROXICODONE) immediate release tablet 10 mg, 10 mg, Oral, Q6H PRN, Emokpae, Courage, MD .  pneumococcal 23 valent vaccine (PNEUMOVAX-23) injection 0.5 mL, 0.5 mL, Intramuscular, Tomorrow-1000, Emokpae, Ejiroghene E, MD .  potassium chloride (KLOR-CON) packet 40 mEq, 40 mEq, Oral, Once, Zierle-Ghosh, Asia B, DO .  senna-docusate (Senokot-S) tablet 2 tablet, 2 tablet, Oral, BID,  Roxan Hockey,  MD ANALYSIS: A 16 channel recording using standard 10 20 measurements is conducted for 26 minutes.  There is significant myogenic artifact seen throughout the recording.  The background activity gets as high as 6 to 7 Hz.  Photic stimulation and hyperventilation are not conducted.  There is no focal or lateral slowing.  There is no epileptiform activity is noted. IMPRESSION: 1.  The recording shows a mild to moderate global slowing indicating a mild to moderate global encephalopathy.  No epileptiform activities are noted. Kofi A. [name], M.D. Diplomate, Tax adviser of Psychiatry and Neurology ( Neurology).   CT HEAD WO CONTRAST  Result Date: 02/24/2020 CLINICAL DATA:  Seizure EXAM: CT HEAD WITHOUT CONTRAST TECHNIQUE: Contiguous axial images were obtained from the base of the skull through the vertex without intravenous contrast. 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 Small volume subarachnoid hemorrhage involving the right frontal lobe and within the right parietal region within the central sulcus at the vertex is stable since prior examination. No interval hemorrhage. No abnormal mass effect or midline shift. Parenchymal volume loss is commensurate with the patient's age and is stable since prior examination. Mild periventricular white matter changes are present likely reflecting the sequela of small vessel ischemia. Remote small right cerebellar infarct again noted. No evidence of acute infarct. Ventricular size is normal. No abnormal intra or extra-axial mass lesion. Vascular: No asymmetric hyperdense vasculature at the skull base. Skull: Normal. Negative for fracture or focal lesion. Sinuses/Orbits: No acute finding. Other: Mastoid air cells and middle ear cavities are clear. IMPRESSION: Stable small volume frontal and parietal subarachnoid hemorrhage. Unchanged right temporoparietal encephalomalacia remained to  remote cortical infarct. No evidence of acute infarct. No abnormal mass effect or midline shift. Electronically Signed   By: Fidela Salisbury MD   On: 02/24/2020 01:21        Scheduled Meds: . chlorhexidine gluconate (MEDLINE KIT)  15 mL Mouth Rinse BID  . Chlorhexidine Gluconate Cloth  6 each Topical Daily  . feeding supplement  237 mL Oral TID BM  . influenza vac split quadrivalent PF  0.5 mL Intramuscular Tomorrow-1000  . insulin aspart  0-5 Units Subcutaneous QHS  . insulin aspart  0-6 Units Subcutaneous TID WC  . ipratropium-albuterol  3 mL Nebulization BID  . lactulose  300 mL Rectal BID  . mouth rinse  15 mL Mouth Rinse BID  . metoprolol tartrate  5 mg Intravenous Q6H  . multivitamin with minerals  1 tablet Oral Daily  . pneumococcal 23 valent vaccine  0.5 mL Intramuscular Tomorrow-1000  . potassium chloride  40 mEq Oral Once  . senna-docusate  2 tablet Oral BID   Continuous Infusions: . dextrose 5 % and 0.45% NaCl 20 mL/hr at 02/25/20 1356  . famotidine (PEPCID) IV 20 mg (02/25/20 0910)  . lacosamide (VIMPAT) IV 200 mg (02/25/20 1129)  . levETIRAcetam 1,000 mg (02/25/20 0949)     LOS: 11 days    Time spent:40 min    Michoel Kunin, Geraldo Docker, MD [facility] Pager [phone]  If 7PM-7AM, please contact night-coverage [email or web address] Password [email or web address] 02/25/2020, 4:00 PM

## 2020-02-25 NOTE — Progress Notes (Signed)
[facility] called, transport set-up. A truck is to be on the way soon.

## 2020-02-25 NOTE — Progress Notes (Signed)
PROGRESS NOTE    Antonio Ramirez  QIO:[id number] DOB: 1960-10-12 DOA: 02/14/2020 PCP: [name], DO    Brief Narrative:  59 year old male with a history of COPD, coronary artery disease, opiate dependence, presents with acute respiratory failure requiring intubation and mechanical ventilation.  Patient was found to have COPD exacerbation with elevated PCO2.  He was intubated on 11/2 extubated on 11/5.  Hospital course further complicated by development of seizures and change in mental status.  CT head showed small subarachnoid hemorrhage.  Neurology following and has been started on Keppra.  Despite antiepileptics, patient continues to have seizures.  Plan is to transfer to [facility] for continuous EEG monitoring.   Assessment & Plan:   Active Problems:   Acute respiratory failure with hypoxia (HCC)   COPD with acute exacerbation (HCC)   Opioid dependence ([place])   Depression   CAD (coronary artery disease)   Endotracheally intubated   Altered mental status   Seizure ([place])   Acute encephalopathy.   -CT head shows small subarachnoid hemorrhage which was not felt to be the cause of his mental status change -I suspect that lethargy is related to seizures/post ictal state as well as recent ativan -Mental status has improved over the past 24 hours, although not back to baseline -pCO2 is elevated, but pH is compensated indicating chronic process -Ammonia is mildly elevated at 53, unclear if clinically significant, started on lactulose enemas -Continue to monitor  Seizure with concerns for status epilepticus -Appreciate neurology assistance -EEG did not show any epileptiform discharges x 2 -He had been started on loading dose of Keppra as well as maintenance dosing on 11/10 -Unfortunately, despite being on Keppra the patient had recurrent seizures on 11/12 -Discussed with Dr. [name] and Vimpat has been added to his medication regimen -It was felt that patient would  need continuous EEG monitoring and transfer to [facility] -Case reviewed with Dr. Theda Sers, neurology at [facility] who will see the patient on transfer -Patient does not appear to have any further seizures since Vimpat was ordered  Small subarachnoid hemorrhage -CT head reviewed with neurosurgery, Dr. Marcello Moores -It was not felt that surgical intervention was indicated at this time -Recommendations were to hold antiplatelet agents -Repeat CT head performed 11/11 showed that hemorrhage was stable  Acute on chronic hypoxic and hypercapnic respiratory failure -Felt to be related to COPD as well as opiate use -Followed by pulmonology -Intubated from 11/2-11/5 -Overall blood gas has improved -Respiratory acidosis is now compensated -Continue BiPAP nightly  Staph epi bacteremia -Transthoracic echocardiogram without evidence of endocarditis -Completed Ancef on 11/7  COPD exacerbation -Treated with steroids, bronchodilators -Overall respiratory status appears to be stable -Currently on 4 L of oxygen -Followed by pulmonology  Acute kidney injury -Baseline creatinine 1.5 -On admission, creatinine noted to be elevated at 1.8 -AKI has resolved with hydration  Chronic systolic congestive heart failure, EF of 45 to 50% -Volume status appears to be stable -Oral medications such as Imdur, losartan and oral metoprolol on hold due to mental status -Cardiology following  Tachyarrhythmias -Noted to have transient SVT versus A. fib with RVR -Cardiology did not recommend anticoagulation due to poor compliance and low A. fib burden as well as subarachnoid hemorrhage  Generalized weakness -Seen by physical therapy with recommendation for skilled nursing facility placement  DVT prophylaxis: Place and maintain sequential compression device Start: 02/20/20 1656 Place TED hose Start: 02/20/20 1656  Code Status: Full code Family Communication: Updated patient's POA Lattie Haw over  phone 11/12 Disposition  Plan: Status is: Inpatient. Patient to be transferred to [facility] for further care  Remains inpatient appropriate because:Inpatient level of care appropriate due to severity of illness   Dispo: The patient is from: Home              Anticipated d/c is to: SNF              Anticipated d/c date is: 3 days              Patient currently is not medically stable to d/c.   Consultants:   Pulmonology  Neurology  Cardiology  Procedures:   ETT 11/2-11/5  Antimicrobials:      Subjective: Patient is sleeping on my arrival, but wakes up to voice.  He does not follow commands and speech is difficult to comprehend  Objective: Vitals:   02/25/20 1513 02/25/20 1536 02/25/20 1615 02/25/20 1642  BP: (!) 148/96  (!) 140/119   Pulse: 88  81   Resp: 20  20   Temp:      TempSrc:      SpO2:  90% 98% (!) 86%  Weight:      Height:        Intake/Output Summary (Last 24 hours) at 02/25/2020 1642 Last data filed at 02/25/2020 1137 Gross per 24 hour  Intake 570.81 ml  Output 2000 ml  Net -1429.19 ml   Filed Weights   02/16/20 0343 02/17/20 0630 02/19/20 0500  Weight: 86.3 kg 86.2 kg 82.5 kg    Examination:  General exam: no distress, sleeping, but wakes up to voice Respiratory system: clear bilaterally Respiratory effort normal. Cardiovascular system:RRR. No murmurs, rubs, gallops. Gastrointestinal system: Abdomen is nondistended, soft and nontender. No organomegaly or masses felt. Normal bowel sounds heard. Central nervous system: awake, does not follow commands, moving upper extremities bilaterallysponatenously Extremities: no edema bilaterally Skin: No rashes, lesions or ulcers Psychiatry: awake, speech is not coherent    Data Reviewed: I have personally reviewed following labs and imaging studies  CBC: Recent Labs  Lab 02/19/20 0439 02/21/20 0418 02/22/20 0432 02/23/20 0408 02/24/20 0233  WBC 13.4* 13.5* 10.3 11.2* 11.7*  HGB 16.7 16.8 16.2 16.0 15.9  HCT 56.1* 53.7*  52.4* 50.8 52.1*  MCV 92.4 87.7 87.5 87.0 89.1  PLT 167 192 191 197 638   Basic Metabolic Panel: Recent Labs  Lab 02/19/20 0439 02/21/20 0418 02/22/20 0432 02/23/20 0408 02/24/20 0233  NA 147* 144 139 142 141  K 3.8 3.4* 3.0* 2.4* 3.1*  CL 105 91* 89* 91* 93*  CO2 33* 41* 41* 41* 38*  GLUCOSE 107* 80 103* 95 98  BUN 29* _0 CREATININE 0.76 0.53* 0.55* 0.63 0.73  CALCIUM 9.9 9.7 9.6 9.6 9.5  MG  --   --  1.8 1.9  --    GFR: Estimated Creatinine Clearance: 111 mL/min (by C-G formula based on SCr of 0.73 mg/dL). Liver Function Tests: Recent Labs  Lab 02/19/20 0439 02/21/20 0418 02/23/20 0408 02/24/20 0233  AST 426* 82* 48* 42*  ALT 175* 50* 47* 42  ALKPHOS 89 78 66 62  BILITOT 1.5* 2.4* 2.3* 2.2*  PROT 6.2* 6.1* 5.8* 5.7*  ALBUMIN 3.2* 3.1* 3.0* 2.8*   No results for input(s): LIPASE, AMYLASE in the last 168 hours. Recent Labs  Lab 02/19/20 0735 02/24/20 0233  AMMONIA 26 53*   Coagulation Profile: No results for input(s): INR, PROTIME in the last 168 hours. Cardiac Enzymes: No results  for input(s): CKTOTAL, CKMB, CKMBINDEX, TROPONINI in the last 168 hours. BNP (last 3 results) No results for input(s): PROBNP in the last 8760 hours. HbA1C: No results for input(s): HGBA1C in the last 72 hours. CBG: Recent Labs  Lab 02/24/20 1631 02/24/20 2018 02/25/20 0820 02/25/20 1205 02/25/20 1240  GLUCAP 93 98 112* 115* 104*   Lipid Profile: No results for input(s): CHOL, HDL, LDLCALC, TRIG, CHOLHDL, LDLDIRECT in the last 72 hours. Thyroid Function Tests: No results for input(s): TSH, T4TOTAL, FREET4, T3FREE, THYROIDAB in the last 72 hours. Anemia Panel: No results for input(s): VITAMINB12, FOLATE, FERRITIN, TIBC, IRON, RETICCTPCT in the last 72 hours. Sepsis Labs: Recent Labs  Lab 02/24/20 0233  LATICACIDVEN 0.9    Recent Results (from the past 240 hour(s))  Culture, blood (Routine X 2) w Reflex to ID Panel     Status: None   Collection Time:  02/16/20  2:33 PM   Specimen: BLOOD RIGHT HAND  Result Value Ref Range Status   Specimen Description BLOOD RIGHT HAND  Final   Special Requests   Final    BOTTLES DRAWN AEROBIC AND ANAEROBIC Blood Culture adequate volume   Culture   Final    NO GROWTH 5 DAYS Performed at [facility], [address] [place], [place] [address]    Report Status 02/21/2020 FINAL  Final  Culture, blood (Routine X 2) w Reflex to ID Panel     Status: None   Collection Time: 02/16/20  2:33 PM   Specimen: BLOOD LEFT HAND  Result Value Ref Range Status   Specimen Description BLOOD LEFT HAND  Final   Special Requests   Final    BOTTLES DRAWN AEROBIC AND ANAEROBIC Blood Culture results may not be optimal due to an inadequate volume of blood received in culture bottles   Culture   Final    NO GROWTH 5 DAYS Performed at [facility], [address] [place], [place] [address]    Report Status 02/21/2020 FINAL  Final         Radiology Studies: EEG  Result Date: 02/23/2020 Phillips Odor, MD     02/23/2020  6:25 PM [place] A. [name], MD     [email or web address]       HISTORY: The patient presents with recurrent seizures. MEDICATIONS: Current Facility-Administered Medications: .  acetaminophen (TYLENOL) tablet 650 mg, 650 mg, Oral, Q6H PRN **OR** acetaminophen (TYLENOL) suppository 650 mg, 650 mg, Rectal, Q6H PRN, Emokpae, Ejiroghene E, MD, 650 mg at 02/23/20 0443 .  albuterol (PROVENTIL) (2.5 MG/3ML) 0.083% nebulizer solution 2.5 mg, 2.5 mg, Nebulization, Q4H PRN, Emokpae, Courage, MD .  chlorhexidine gluconate (MEDLINE KIT) (PERIDEX) 0.12 % solution 15 mL, 15 mL, Mouth Rinse, BID, Emokpae, Ejiroghene E, MD, 15 mL at 02/23/20 1111 .  Chlorhexidine Gluconate Cloth 2 % PADS 6 each, 6 each, Topical, Daily, Anders Simmonds, MD, 6 each at 02/23/20 1112 .  dextrose 5 %-0.45 % sodium chloride infusion, , Intravenous, Continuous, Emokpae, Courage, MD, Last Rate: 20 mL/hr at 02/23/20 1250, New Bag at  02/23/20 1250 .  diphenhydrAMINE (BENADRYL) injection 25 mg, 25 mg, Intravenous, Q6H PRN, Emokpae, Courage, MD .  famotidine (PEPCID) IVPB 20 mg premix, 20 mg, Intravenous, Q12H, Emokpae, Ejiroghene E, MD, Last Rate: 100 mL/hr at 02/23/20 1112, 20 mg at 02/23/20 1112 .  feeding supplement (ENSURE ENLIVE / ENSURE PLUS) liquid 237 mL, 237 mL, Oral, TID BM, Emokpae, Courage, MD, 237 mL at 02/21/20 2147 .  influenza vac split quadrivalent PF (  FLUARIX) injection 0.5 mL, 0.5 mL, Intramuscular, Tomorrow-1000, Emokpae, Ejiroghene E, MD .  insulin aspart (novoLOG) injection 0-5 Units, 0-5 Units, Subcutaneous, QHS, Emokpae, Courage, MD .  insulin aspart (novoLOG) injection 0-6 Units, 0-6 Units, Subcutaneous, TID WC, Emokpae, Courage, MD .  ipratropium-albuterol (DUONEB) 0.5-2.5 (3) MG/3ML nebulizer solution 3 mL, 3 mL, Nebulization, Q6H, Emokpae, Courage, MD, 3 mL at 02/23/20 1311 .  isosorbide mononitrate (IMDUR) 24 hr tablet 30 mg, 30 mg, Oral, Daily, Emokpae, Courage, MD .  levETIRAcetam (KEPPRA) IVPB 500 mg/100 mL premix, 500 mg, Intravenous, Q8H, [place], Kofi, MD, Last Rate: 400 mL/hr at 02/23/20 1117, 500 mg at 02/23/20 1117 .  LORazepam (ATIVAN) injection 0.5 mg, 0.5 mg, Intravenous, Q8H PRN, Emokpae, Courage, MD, 0.5 mg at 02/23/20 0114 .  losartan (COZAAR) tablet 25 mg, 25 mg, Oral, Daily, Satira Sark, MD, 25 mg at 02/21/20 0906 .  MEDLINE mouth rinse, 15 mL, Mouth Rinse, BID, Emokpae, Courage, MD, 15 mL at 02/23/20 1115 .  metoprolol tartrate (LOPRESSOR) tablet 37.5 mg, 37.5 mg, Oral, BID, [place], Alphonse Guild, MD .  multivitamin with minerals tablet 1 tablet, 1 tablet, Oral, Daily, Emokpae, Courage, MD, 1 tablet at 02/21/20 1700 .  ondansetron (ZOFRAN) tablet 4 mg, 4 mg, Oral, Q6H PRN **OR** ondansetron (ZOFRAN) injection 4 mg, 4 mg, Intravenous, Q6H PRN, Emokpae, Ejiroghene E, MD .  oxyCODONE (Oxy IR/ROXICODONE) immediate release tablet 10 mg, 10 mg, Oral, Q6H PRN, Emokpae, Courage, MD .  pneumococcal 23  valent vaccine (PNEUMOVAX-23) injection 0.5 mL, 0.5 mL, Intramuscular, Tomorrow-1000, Emokpae, Ejiroghene E, MD .  potassium chloride (KLOR-CON) packet 40 mEq, 40 mEq, Oral, Once, Zierle-Ghosh, Asia B, DO .  senna-docusate (Senokot-S) tablet 2 tablet, 2 tablet, Oral, BID, Emokpae, Courage, MD ANALYSIS: A 16 channel recording using standard 10 20 measurements is conducted for 26 minutes.  There is significant myogenic artifact seen throughout the recording.  The background activity gets as high as 6 to 7 Hz.  Photic stimulation and hyperventilation are not conducted.  There is no focal or lateral slowing.  There is no epileptiform activity is noted. IMPRESSION: 1.  The recording shows a mild to moderate global slowing indicating a mild to moderate global encephalopathy.  No epileptiform activities are noted. [place] A. [name], M.D. Diplomate, Tax adviser of Psychiatry and Neurology ( Neurology).   CT HEAD WO CONTRAST  Result Date: 02/24/2020 CLINICAL DATA:  Seizure EXAM: CT HEAD WITHOUT CONTRAST TECHNIQUE: Contiguous axial images were obtained from the base of the skull through the vertex without intravenous contrast. 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 Small volume subarachnoid hemorrhage involving the right frontal lobe and within the right parietal region within the central sulcus at the vertex is stable since prior examination. No interval hemorrhage. No abnormal mass effect or midline shift. Parenchymal volume loss is commensurate with the patient's age and is stable since prior examination. Mild periventricular white matter changes are present likely reflecting the sequela of small vessel ischemia. Remote small right cerebellar infarct again noted. No evidence of acute infarct. Ventricular size is normal. No abnormal intra or extra-axial mass lesion. Vascular: No asymmetric hyperdense vasculature at the skull  base. Skull: Normal. Negative for fracture or focal lesion. Sinuses/Orbits: No acute finding. Other: Mastoid air cells and middle ear cavities are clear. IMPRESSION: Stable small volume frontal and parietal subarachnoid hemorrhage. Unchanged right temporoparietal encephalomalacia remained to remote cortical infarct. No evidence of acute infarct. No abnormal mass effect  or midline shift. Electronically Signed   By: Fidela Salisbury MD   On: 02/24/2020 01:21        Scheduled Meds: . chlorhexidine gluconate (MEDLINE KIT)  15 mL Mouth Rinse BID  . Chlorhexidine Gluconate Cloth  6 each Topical Daily  . feeding supplement  237 mL Oral TID BM  . influenza vac split quadrivalent PF  0.5 mL Intramuscular Tomorrow-1000  . insulin aspart  0-5 Units Subcutaneous QHS  . insulin aspart  0-6 Units Subcutaneous TID WC  . ipratropium-albuterol  3 mL Nebulization BID  . lactulose  300 mL Rectal BID  . mouth rinse  15 mL Mouth Rinse BID  . metoprolol tartrate  5 mg Intravenous Q6H  . multivitamin with minerals  1 tablet Oral Daily  . pneumococcal 23 valent vaccine  0.5 mL Intramuscular Tomorrow-1000  . senna-docusate  2 tablet Oral BID   Continuous Infusions: . dextrose 5 % and 0.45% NaCl 20 mL/hr at 02/25/20 1356  . famotidine (PEPCID) IV 20 mg (02/25/20 0910)  . lacosamide (VIMPAT) IV 200 mg (02/25/20 1129)  . levETIRAcetam 1,000 mg (02/25/20 0949)  . potassium chloride 10 mEq (02/25/20 1640)     LOS: 11 days    Time spent: 35 minutes   Kathie Dike, MD [facility]   If 7PM-7AM, please contact night-coverage [email or web address]  02/25/2020, 4:42 PM

## 2020-02-25 NOTE — Progress Notes (Signed)
Patient arrived to 3w-14 via [facility]. Patient in no distress, vitals taken, orders reviewed.

## 2020-02-25 NOTE — Progress Notes (Signed)
Rapid response notified and MD Sherral Hammers of patient change in respiratory status. Patient now on 100% non-rebreathrer. Patient was seen by MD New orders placed

## 2020-02-25 NOTE — Progress Notes (Signed)
Report called and given to [facility]. They are en route, should be here in about 30 mins.

## 2020-02-25 NOTE — Progress Notes (Signed)
LTM EEG hooked up and running - no initial skin breakdown - push button tested - neuro notified. [facility] monitored 

## 2020-02-25 NOTE — Progress Notes (Signed)
RT called to patient's room due to decreased SPO2. Arrived to room and SPO2 was 85% on NRB. Rapid Response nurse and provider Sherral Hammers) notified. Patient's SPO2 increased to 91% on NRB. RT will continue to monitor and get BiPAP order updated to QHS and PRN, should it be needed.

## 2020-02-25 NOTE — Progress Notes (Signed)
Report called and given to Aaron Edelman, RN on 3W at [facility].

## 2020-02-25 NOTE — Consult Note (Signed)
Neurology Consult H&P  CC: SAH and seizure transferred from outside hospital for long term EEG monitoring  History is obtained from:  Chart review  HPI: Antonio Ramirez is a 59 y.o. male with SAH and recurrent sieuzres transferred from outside hospital for long term EEG monitoring. At OSH was intubated then extubated on 11/5, developed change in mental status and noted to have seizures with CT head showing small subarachnoid hemorrhage. Neurology was following and started on LEV with breakthrough seizures and LAC was then added.  Modified Fisher Grade 3 NIHSS: 22  ROS: Unable to assess due to altered mental status.   Past Medical History:  Diagnosis Date  . CAD (coronary artery disease)   . COPD (chronic obstructive pulmonary disease) ([place])   . Opioid dependence ([place])   . Smoker      Family History  Problem Relation Age of Onset  . COPD Mother     Social History:  reports that he has been smoking cigarettes. He has been smoking about 1.00 pack per day. He has never used smokeless tobacco. He reports that he does not drink alcohol and does not use drugs.   Prior to Admission medications   Medication Sig Start Date End Date Taking? Authorizing Provider  albuterol (VENTOLIN HFA) 108 (90 Base) MCG/ACT inhaler Inhale 1-2 puffs into the lungs every 6 (six) hours as needed for wheezing or shortness of breath. 06/03/19  Yes Johnson, Clanford L, MD  AMITIZA 24 MCG capsule Take 24 mcg by mouth 2 (two) times daily with a meal. 05/17/19  Yes [provider]  aspirin EC 81 MG tablet Take 1 tablet (81 mg total) by mouth daily. 06/03/19  Yes Johnson, Clanford L, MD  aspirin-acetaminophen-caffeine (EXCEDRIN MIGRAINE) [id number] MG tablet Take 2 tablets by mouth every 6 (six) hours as needed for headache.   Yes [provider]  atorvastatin (LIPITOR) 40 MG tablet Take 1 tablet (40 mg total) by mouth daily at 6 PM. 06/03/19  Yes Johnson, Clanford L, MD  calcitRIOL (ROCALTROL) 0.5  MCG capsule Take 0.5 mcg by mouth daily. 03/16/19  Yes [provider]  citalopram (CELEXA) 10 MG tablet Take 1 tablet (10 mg total) by mouth daily. 07/04/19  Yes Patrecia Pour, MD  clopidogrel (PLAVIX) 75 MG tablet Take 75 mg by mouth daily. 06/27/19  Yes [provider]  gabapentin (NEURONTIN) 600 MG tablet Take 600 mg by mouth at bedtime.  05/27/19  Yes [provider]  isosorbide mononitrate (IMDUR) 30 MG 24 hr tablet Take 30 mg by mouth daily.  01/22/17  Yes [provider]  omeprazole (PRILOSEC) 20 MG capsule Take 20 mg by mouth daily before breakfast. 30 min prior to breakfast. 04/13/19  Yes [provider]  oxyCODONE (ROXICODONE) 15 MG immediate release tablet Take 1 tablet (15 mg total) by mouth 3 (three) times daily as needed for pain. 07/04/19  Yes Patrecia Pour, MD  senna-docusate (SENOKOT-S) 8.6-50 MG tablet Take 2 tablets by mouth at bedtime as needed for mild constipation. 06/03/19  Yes Johnson, Clanford L, MD  SYMBICORT 160-4.5 MCG/ACT inhaler Inhale 2 puffs into the lungs 2 (two) times daily. 04/29/19  Yes [provider]  XTAMPZA ER 36 MG C12A Take 1 capsule (36 mg total) by mouth every 12 (twelve) hours. 07/04/19  Yes Patrecia Pour, MD  polyethylene glycol (MIRALAX / GLYCOLAX) packet Take 17 g by mouth daily. Patient not taking: Reported on 02/13/2020 02/11/17   Long, Wonda Olds, MD  Exam: Current vital signs: BP (!) 140/119 (BP Location: Left Arm)   Pulse 81   Temp 97.7 F (36.5 C) (Oral)   Resp 20   Ht 6\' 1"  (1.854 m)   Wt 82.5 kg   SpO2 98%   BMI 24.00 kg/m   Physical Exam  Constitutional: Appears well-developed and well-nourished.  Psych: unable to assess due to encephalopathy. Eyes: No scleral injection HENT: No OP obstrucion Head: Normocephalic.  Cardiovascular: Normal rate and regular rhythm.  Respiratory: Effort normal and breath sounds normal to anterior ascultation GI: Soft.  No distension. There is no  tenderness.  Skin: WDI  Neuro: Mental Status: Patient is awake, alert, not oriented. Occasional words but does not make sense. Cranial Nerves: II: Visual Fields are full. Pupils are equal, round, and reactive to light. III,IV, VI: EOMI without ptosis or diploplia.  VII: Facial movement is symmetric.  VIII: hearing is intact to voice X: Uvula elevates symmetrically XI: Shoulder shrug is symmetric. XII: tongue is midline without atrophy or fasciculations.  Motor: Tone is normal. Bulk is normal. Unable to completely assess due to encephalopathy not following commands. Sensory: withdraws to pain consistently in all extremities. Deep Tendon Reflexes: 2+ and symmetric in the biceps and patellae. Plantars: Mute Cerebellar: unable to assess due to encephalopathy.  I have reviewed the images obtained: NCT head showed small volume subarachnoid hemorrhage of right frontal lobe and in right parietal region in the central sulcus at the vertex is stable since prior examination 02/23/2020.  Assessment: Antonio Ramirez is a 59 y.o. 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 It is not clear if this is traumatic. Modified Fisher grade 3 and therefore very high risk of seizures and he will need continuous EEG monitoring for nonconvulsive status epilepticus.  Impression:  Nonconvulsive status epilepticus Seizures Encephalopathy Right frontal lobe and in right parietal lobe SAH Modified Fisher grade 3  Plan: - continue levetiracetam 1,500mg  IV two times daily. - continue lacosamide 200mg  two times daily. - LTM EEG - If seizure is noted consider increasing levetiracetam to 2,000mg  two times daily and lacosamide may be increased to 300mg  two times daily. Valproic acid would be a good agent as well. - MRI brain without contrast. - Recommend vascular imaging with MRA head and neck. - Recommend TTE. - Recommend labs: HbA1c,  lipid panel, TSH. - Recommend Statin if LDL > 70 - No antiplatelet for now. - SBP goal < 140. - Telemetry monitoring for arrhythmia. - Recommend bedside Swallow screen. - Recommend Stroke education. - Recommend PT/OT/SLP consult.   Electronically signed by: Dr. Lynnae Sandhoff Pager: [phone] 02/25/2020, 4:32 PM

## 2020-02-25 NOTE — Significant Event (Signed)
Rapid Response Event Note   Reason for Call :  Second set of eyes  Initial Focused Assessment:  [facility] arrived from [facility] early this afternoon so he could get a 24 hour EEG.  Patient was transported on 4L and is now requiring 16L NRB mask.  Patient is AOx0.  He is mumbling sentences but is really hard to identify what he is saying.  Blood gas was drawn on arrival and results are in the patient's chart.  The patient has orders for PM bipap.  The patient responds to pain but is lethargic.    Interventions:  Vitals taken  Plan of Care:  Bipap orders modified for PRN bipap   Event Summary:   MD Notified: Sherral Hammers MD Call Time: [place] Time: [place]  Venetia Maxon, RN

## 2020-02-26 ENCOUNTER — Inpatient Hospital Stay (HOSPITAL_COMMUNITY): Payer: Medicare Other

## 2020-02-26 DIAGNOSIS — J9601 Acute respiratory failure with hypoxia: Secondary | ICD-10-CM | POA: Diagnosis not present

## 2020-02-26 DIAGNOSIS — R569 Unspecified convulsions: Secondary | ICD-10-CM | POA: Diagnosis not present

## 2020-02-26 DIAGNOSIS — J9602 Acute respiratory failure with hypercapnia: Secondary | ICD-10-CM | POA: Diagnosis not present

## 2020-02-26 DIAGNOSIS — I609 Nontraumatic subarachnoid hemorrhage, unspecified: Secondary | ICD-10-CM

## 2020-02-26 DIAGNOSIS — F1129 Opioid dependence with unspecified opioid-induced disorder: Secondary | ICD-10-CM | POA: Diagnosis not present

## 2020-02-26 DIAGNOSIS — R4182 Altered mental status, unspecified: Secondary | ICD-10-CM | POA: Diagnosis not present

## 2020-02-26 DIAGNOSIS — Z8673 Personal history of transient ischemic attack (TIA), and cerebral infarction without residual deficits: Secondary | ICD-10-CM

## 2020-02-26 LAB — GLUCOSE, CAPILLARY
Glucose-Capillary: 131 mg/dL — ABNORMAL HIGH (ref 70–99)
Glucose-Capillary: 113 mg/dL — ABNORMAL HIGH (ref 70–99)
Glucose-Capillary: 107 mg/dL — ABNORMAL HIGH (ref 70–99)
Glucose-Capillary: 123 mg/dL — ABNORMAL HIGH (ref 70–99)

## 2020-02-26 LAB — COMPREHENSIVE METABOLIC PANEL
ALT: 38 U/L (ref 0–44)
AST: 32 U/L (ref 15–41)
Albumin: 3 g/dL — ABNORMAL LOW (ref 3.5–5.0)
Alkaline Phosphatase: 70 U/L (ref 38–126)
Anion gap: 9 (ref 5–15)
BUN: 17 mg/dL (ref 6–20)
CO2: 32 mmol/L (ref 22–32)
Calcium: 10.9 mg/dL — ABNORMAL HIGH (ref 8.9–10.3)
Chloride: 102 mmol/L (ref 98–111)
Creatinine, Ser: 0.78 mg/dL (ref 0.61–1.24)
GFR, Estimated: >60 mL/min (ref >60)
Glucose, Bld: 125 mg/dL — ABNORMAL HIGH (ref 70–99)
Potassium: 4.5 mmol/L (ref 3.5–5.1)
Sodium: 143 mmol/L (ref 135–145)
Total Bilirubin: 2.1 mg/dL — ABNORMAL HIGH (ref 0.3–1.2)
Total Protein: 6.3 g/dL — ABNORMAL LOW (ref 6.5–8.1)

## 2020-02-26 LAB — CBC WITH DIFFERENTIAL/PLATELET
Abs Immature Granulocytes: 0.09 10*3/uL — ABNORMAL HIGH (ref 0.00–0.07)
Basophils Absolute: 0 10*3/uL (ref 0.0–0.1)
Basophils Relative: 0 %
Eosinophils Absolute: 0 10*3/uL (ref 0.0–0.5)
Eosinophils Relative: 0 %
HCT: 56.6 % — ABNORMAL HIGH (ref 39.0–52.0)
Hemoglobin: 17.6 g/dL — ABNORMAL HIGH (ref 13.0–17.0)
Immature Granulocytes: 1 %
Lymphocytes Relative: 1 %
Lymphs Abs: 0.2 10*3/uL — ABNORMAL LOW (ref 0.7–4.0)
MCH: 27.6 pg (ref 26.0–34.0)
MCHC: 31.1 g/dL (ref 30.0–36.0)
MCV: 88.7 fL (ref 80.0–100.0)
Monocytes Absolute: 0.1 10*3/uL (ref 0.1–1.0)
Monocytes Relative: 0 %
Neutro Abs: 16.2 10*3/uL — ABNORMAL HIGH (ref 1.7–7.7)
Neutrophils Relative %: 98 %
Platelets: 172 10*3/uL (ref 150–400)
RBC: 6.38 MIL/uL — ABNORMAL HIGH (ref 4.22–5.81)
RDW: 20.5 % — ABNORMAL HIGH (ref 11.5–15.5)
WBC: 16.5 10*3/uL — ABNORMAL HIGH (ref 4.0–10.5)
nRBC: 0 % (ref 0.0–0.2)

## 2020-02-26 LAB — MAGNESIUM: Magnesium: 1.9 mg/dL (ref 1.7–2.4)

## 2020-02-26 LAB — PHOSPHORUS: Phosphorus: 2.4 mg/dL — ABNORMAL LOW (ref 2.5–4.6)

## 2020-02-26 MED ORDER — GADOBUTROL 1 MMOL/ML IV SOLN
7.5000 mL | Freq: Once | INTRAVENOUS | Status: AC | PRN
  Administered 2020-02-26: 7.5 mL via INTRAVENOUS

## 2020-02-26 MED ORDER — AMLODIPINE BESYLATE 10 MG PO TABS: 10.0000 mg | ORAL_TABLET | Freq: Every day | ORAL | Status: DC

## 2020-02-26 MED ORDER — IOHEXOL 350 MG/ML SOLN
80.0000 mL | Freq: Once | INTRAVENOUS | Status: AC | PRN
  Administered 2020-02-26: 80 mL via INTRAVENOUS

## 2020-02-26 MED ORDER — LORAZEPAM 2 MG/ML IJ SOLN
1.0000 mg | Freq: Once | INTRAMUSCULAR | Status: AC | PRN
  Administered 2020-02-26: 1 mg via INTRAVENOUS

## 2020-02-26 NOTE — Procedures (Addendum)
Patient Name: Antonio Ramirez  MRN: [id number]  Epilepsy Attending: Lora Havens  Referring Physician/Provider: Dr Lynnae Sandhoff Duration: 02/25/2020 1805 to 02/26/2020 1705  Patient history: 59 year old male with chronic right parietal and temporal strokes who presented with altered mental status.  EEG to evaluate for seizures.  Level of alertness: Awake,asleep  AEDs during EEG study: LEV, LCM  Technical aspects: This EEG study was done with scalp electrodes positioned according to the 10-20 International system of electrode placement. Electrical activity was acquired at a sampling rate of 500Hz  and reviewed with a high frequency filter of 70Hz  and a low frequency filter of 1Hz . EEG data were recorded continuously and digitally stored.   Description: No clear posterior dominant rhythm was seen. Sleep was charcaterized by vertex waves, sleep spindles (12-14hz ), maximal frontocentral region.  EEG showed continuous generalized 3 to 6 Hz theta delta slowing admixed with 15-18Hz  frontocentral beta activity.  Hyperventilation and photic stimulation were not performed.     ABNORMALITY -Continuous slow, generalized  IMPRESSION: This study is suggestive of mild to moderate diffuse encephalopathy, nonspecific etiology. No seizures or epileptiform discharges were seen throughout the recording.  Idolina Mantell [name]

## 2020-02-26 NOTE — Progress Notes (Signed)
LTM EEG discontinued - no skin breakdown at [facility]. Patient is going to MRI

## 2020-02-26 NOTE — Progress Notes (Signed)
Transport has just left the floor with the patient for sccheduled diagnostics

## 2020-02-26 NOTE — Progress Notes (Signed)
Wall unit oxygen was not working properly, charge nurse advised to move patient to RM 12. RT placed patient on bipap, 02 is sustained at 99%

## 2020-02-26 NOTE — Progress Notes (Addendum)
STROKE TEAM PROGRESS NOTE   INTERVAL HISTORY His RN is at the bedside.  Pt still on O2 mask, and LTM EEG. Pt is able to tell me his first name but not answer other orientation questions. He is also perseverated on his first name. Severe dysarthria with word salad. Right side weakness much improved, seems to have similar strength with left UE and seems stronger than left LE. His right sided seizure presentation does not fit to the right brain [facility] and old right infarct. Pt only had CTs during the admission, has not have other work up yet. Will do CTA head and neck and MRI with and without contrast.    OBJECTIVE Vitals:   02/26/20 0440 02/26/20 0814 02/26/20 0824 02/26/20 0825  BP: (!) 152/101   (!) 161/124  Pulse: 67   63  Resp: 14   18  Temp: 98.4 F (36.9 C)   98.8 F (37.1 C)  TempSrc: Oral   Oral  SpO2: 100% 99% 98% 100%  Weight:      Height:        CBC:  Recent Labs  Lab 02/24/20 0233 02/26/20 0112  WBC 11.7* 16.5*  NEUTROABS  --  16.2*  HGB 15.9 17.6*  HCT 52.1* 56.6*  MCV 89.1 88.7  PLT 186 785    Basic Metabolic Panel:  Recent Labs  Lab 02/23/20 0408 02/23/20 0408 02/24/20 0233 02/26/20 0112  NA 142   < > 141 143  K 2.4*   < > 3.1* 4.5  CL 91*   < > 93* 102  CO2 41*   < > 38* 32  GLUCOSE 95   < > 98 125*  BUN 16   < > 17 17  CREATININE 0.63   < > 0.73 0.78  CALCIUM 9.6   < > 9.5 10.9*  MG 1.9  --   --  1.9  PHOS  --   --   --  2.4*   < > = values in this interval not displayed.    Lipid Panel:     Component Value Date/Time   CHOL 179 06/01/2019 0733   TRIG 82 02/14/2020 2045   HDL 46 06/01/2019 0733   CHOLHDL 3.9 06/01/2019 0733   VLDL 20 06/01/2019 0733   LDLCALC 113 (H) 06/01/2019 0733   HgbA1c:  Lab Results  Component Value Date   HGBA1C 6.9 (H) 02/15/2020   Urine Drug Screen:     Component Value Date/Time   LABOPIA POSITIVE (A) 02/14/2020 1832   COCAINSCRNUR NONE DETECTED 02/14/2020 1832   LABBENZ NONE DETECTED 02/14/2020 1832    AMPHETMU NONE DETECTED 02/14/2020 1832   THCU POSITIVE (A) 02/14/2020 1832   LABBARB NONE DETECTED 02/14/2020 1832    Alcohol Level     Component Value Date/Time   ETH <10 06/29/2019 1329    IMAGING  EEG 02/23/2020 IMPRESSION:  The recording shows a mild to moderate global slowing indicating a mild to moderate global encephalopathy.  No epileptiform activities are noted.   EEG 02/22/2020 ABNORMALITY  Continuous slow, generalized  IMPRESSION:  This study is suggestive of moderate diffuse encephalopathy, nonspecific etiology. No seizures or epileptiform discharges were seen throughout the recording.   CT HEAD WO CONTRAST 02/24/2020 IMPRESSION:  Stable small volume frontal and parietal subarachnoid hemorrhage. Unchanged right temporoparietal encephalomalacia remained to remote cortical infarct. No evidence of acute infarct. No abnormal mass effect or midline shift.  CT HEAD WO CONTRAST 02/23/2020 IMPRESSION:  1. Stable subarachnoid hemorrhage in the  right anterior mid parietal lobe regions. No new focus or progression of hemorrhage.  2. Stable infarct involving portions of the right temporal lobe as well as the right parietal-occipital junction with suspected more acute component along the superomedial aspect of this infarct, stable. No new infarct evident.  3.  Stable atrophy with mild periventricular small vessel disease.  4.  Foci of arterial vascular calcification.  5.  Mild paranasal sinus disease.  CT HEAD WO CONTRAST 02/22/2020   IMPRESSION:  Significantly motion degraded examination, limiting evaluation. Small volume acute subarachnoid hemorrhage overlying the right parietal and frontal lobes, new as compared to the head CT of 02/18/2020. Redemonstrated chronic cortical/subcortical infarcts within the right parietal and temporal lobes. Stable generalized cerebral atrophy.   CT HEAD WO CONTRAST 02/18/2020 IMPRESSION:  Chronic right MCA infarct. No acute abnormality  and no change from the prior CT.   CT Head Wo Contrast 02/13/2020 IMPRESSION:  Chronic changes of encephalomalacia in the distribution of the right middle cerebral artery. No acute abnormality noted.   NM Pulmonary Perfusion 02/15/2020 IMPRESSION:  No scintigraphic evidence of pulmonary embolism.   US Venous Img Lower Unilateral Left (DVT) 02/15/2020 IMPRESSION:  No evidence of left lower extremity deep venous thrombosis.   EEG LTVM - Continuous Bedside W/ Video Includes Portable EEG Read 02/26/2020 ABNORMALITY  Continuous slow, generalized   IMPRESSION:  This study is suggestive of mild to moderate diffuse encephalopathy, nonspecific etiology. No seizures or epileptiform discharges were seen throughout the recording.     ECHOCARDIOGRAM COMPLETE 02/16/2020 IMPRESSIONS   1. Left ventricular ejection fraction, by estimation, is 45 to 50%. The left ventricle has mildly decreased function. The left ventricle has no regional wall motion abnormalities. There is mild left ventricular hypertrophy. Left ventricular diastolic parameters are indeterminate.   2. Ventricular septum is flattened in systole consistent with RV pressure overload. . Right ventricular systolic function is severely reduced. The right ventricular size is severely enlarged.   3. Right atrial size was moderately dilated.   4. The mitral valve is normal in structure. No evidence of mitral valve regurgitation. No evidence of mitral stenosis.   5. AV and LVOT Dopplers are off axis and likely underestimated. Visually there is no significant stenosis or regurgitation. . The aortic valve is tricuspid. Aortic valve regurgitation is not visualized. No aortic stenosis is present.   EEG LTVM - Continuous Bedside W/ Video Includes Portable EEG Read 02/26/2020 ABNORMALITY  Continuous slow, generalized   IMPRESSION:  This study is suggestive of mild to moderate diffuse encephalopathy, nonspecific etiology. No seizures or  epileptiform discharges were seen throughout the recording.    ECG - SR rate 73 BPM. (See cardiology reading for complete details)  PHYSICAL EXAM  Temp:  [97.5 F (36.4 C)-98.8 F (37.1 C)] 98.2 F (36.8 C) (11/14 1226) Pulse Rate:  [63-93] 76 (11/14 1226) Resp:  [14-24] 20 (11/14 1226) BP: (140-163)/(76-124) 157/90 (11/14 1226) SpO2:  [86 %-100 %] 96 % (11/14 1405) FiO2 (%):  [40 %-60 %] 40 % (11/14 1405) Weight:  [77 kg] 77 kg (11/14 1229)  General - Well nourished, well developed, on face mask, not in acute distress.  Ophthalmologic - fundi not visualized due to noncooperation.  Cardiovascular - Regular rhythm and rate.  Neuro - awake with eyes open, on face mask, severe dysarthria. Able to tell me his last name and then perseverated on it. Not answer other questions but word salad, intangible. Not following simple commands. Blinking to visual threat bilaterally, b/l gaze  complete with tracking. PERRL. No facial asymmetry. BUE on restrain, once released, b/l UE 3-/5 deltoid, and 3/5 biceps, finger grip b/l 2/5. RLE able to hold at knee flexion and foot on bed position, but LLE drift with that position. Sensation, coordination and gait not tested.   ASSESSMENT/PLAN Mr. TYLEEK SMICK is a 59 y.o. male with history of CAD, COPD, Opioid dependency,  and tobacco use admitted to [facility] on 02/14/20 with AMS, hypoxia, hypotension and was intubated (extubated 02/17/20).  Hospital course at [facility] complicated by Staph Epi bacteremia, AKI, cardiology consult for SVT (probable rapid afib per Dr Domenic Polite), fluid overload, EF 45-50% by echo, elevated LFTs, previous Rt MCA infarct by CT 02/18/20, seizure activity noted 02/22/20, small SAH on CT (NS consult Dr Duffy Rhody), EEG 11/10, Neurology consult 11/10 Dr [name] -> Keppra, recurrent seizures - tx'd to [facility] 02/25/20. He did not receive IV t-PA due to [place].  Small SAH - right frontoparietal region, source unclear - needs to rule out  endocarditis  CT Head - 11/12 - Stable small volume frontal and parietal subarachnoid hemorrhage. Unchanged right temporoparietal encephalomalacia remained to remote cortical infarct.   CT head - 11/11 - Stable subarachnoid hemorrhage in the right anterior mid parietal lobe regions.   CTA head and neck pending  MRI head with and without contrast pending  2D Echo - EF 45 to 50%. No cardiac source of emboli identified.   Sars Corona Virus 2 - negative  LDL - pending  HgbA1c - 6.9  UDS - 02/14/20 - opiates and THCU  VTE prophylaxis - SCDs  aspirin 81 mg daily and clopidogrel 75 mg daily prior to admission, now on No antithrombotic.   Ongoing aggressive stroke risk factor management  Therapy recommendations:  pending  Disposition:  Pending  Seizure   11/10 episode of right sided shaking followed by right sided hemiplegia  11/12 overnight more episode of right sided tonic clonic activity, and more altered  Not fit to the neuro imaging with right brain SAH or old infarct  EEG - 11/14 - Continuous slow, generalized. This study is suggestive of mild to moderate diffuse encephalopathy, nonspecific etiology. No seizures or epileptiform discharges were seen throughout the recording.   Loaded with keppra and keppra 500mg  Q8h->1000mg  Q8h  On vimpat 200mg  bid  Will check MRI with and without contrast  Tachyarrhythmia   Noted SVT vs. Afib RVR in AP  Now SNR  Not AC candidate for now given substance use, noncompliance and current SAH  Follow up with neurology as oupt to see if become candidate later  Respiratory failure Sepsis with leukocytosis COPD exacerbation  On face mask  On solumedrol  Management per primary team  Repeat blood culture pending  Cardiomyopathy Needs to rule out endocarditis  EF 55-60% in 05/2019  EF 45-50% this admission  Given SAH and leukocytosis - will repeat B Cx   If B Cx positive, will need TEE to rule out  endocarditis  Hypertension  Home BP meds: none  Current BP meds: metoprolol  Blood pressure high at times  Add amlodipine  BP goal < 160  . Long-term BP goal normotensive  Hyperlipidemia  Home Lipid lowering medication: Lipitor 40 mg daily  LDL pending, goal < 70  Hold off statin for now due to [facility]  Continue statin at discharge  Dysphagia   Did not pass swallow  On TF  Speech to follow  Tobacco abuse  Current smoker  Smoking cessation counseling will be provided  Other Stroke Risk Factors  Advanced age  Hx stroke/TIA - right parietal old infarct on imaging  Coronary artery disease on DAPT PTA  Hx of substance abuse  Other Active Problems  Code status - Full code  Hospital day # 12  Pt condition critical due to [facility], seizure and sepsis, at risk of neuro worsening, recurrent SAH, ICH, status epilepticus. I spent  35 minutes in total face-to-face time with the patient, more than 50% of which was spent in counseling and coordination of care, reviewing test results, images and medication, and discussing the diagnosis, treatment plan and potential prognosis. This patient's care requiresreview of multiple databases, neurological assessment, discussion with family, other specialists and medical decision making of high complexity.   Rosalin Hawking, MD PhD Stroke Neurology 02/26/2020 2:58 PM   To contact Stroke Continuity provider, please refer to [email or web address] After hours, contact General Neurology

## 2020-02-26 NOTE — Progress Notes (Signed)
LTM maint complete - no skin breakdown under:  Fp1 A1 F7

## 2020-02-26 NOTE — Progress Notes (Signed)
PROGRESS NOTE    Antonio Ramirez  GUY:[id number] DOB: 11/27/1960 DOA: 02/14/2020 PCP: [name], DO     Brief Narrative:  59 year old WM PMHx  COPD, coronary artery disease, opiate dependence, presents with acute respiratory failure requiring intubation and mechanical ventilation.  Patient was found to have COPD exacerbation with elevated PCO2.  He was intubated on 11/2 extubated on 11/5.  Hospital course further complicated by development of seizures and change in mental status.  CT head showed small subarachnoid hemorrhage.  Neurology following and has been started on Keppra.  Despite antiepileptics, patient continues to have seizures.  Plan is to transfer to [facility] for continuous EEG monitoring.   Subjective: 11/14 afebrile overnight babbling incoherently, does not follow commands.  Withdraws to painful stimuli   Assessment & Plan: Covid vaccination;   Active Problems:   Acute respiratory failure with hypoxia (HCC)   COPD with acute exacerbation (HCC)   Opioid dependence ([place])   Depression   CAD (coronary artery disease)   Endotracheally intubated   Altered mental status   Seizure ([place])   Subarachnoid hemorrhage   Acute encephalopathy.   -CT head shows small subarachnoid hemorrhage which was not felt to be the cause of his mental status change -I suspect that lethargy is related to seizures/post ictal state as well as recent ativan -overall mental status has started to improve since seizure this morning, although not back to baseline -pCO2 is elevated, but pH is compensated indicating chronic process -Ammonia is mildly elevated at 53, unclear if clinically significant, will start on lactulose enemas -Continue to monitor -11/14 stroke team does not believe encephalopathy consistent with small SAH and has ordered full stroke work-up.  Seizure with concerns for status epilepticus -EEG did not show any epileptiform discharges x 2 -He had been started  on loading dose of Keppra as well as maintenance dosing on 11/10 -Unfortunately, despite being on Keppra the patient had recurrent seizures overnight -Discussed with Dr. [name] and Vimpat has been added to his medication regimen -It was felt that patient would need continuous EEG monitoring and transfer to [facility] -Case reviewed with Dr. Theda Sers, neurology at [facility] who will see the patient on transfer -Currently, patient does not appear to be actively seizing, mental status is slowly improving -11/13 spoke with Dr. Jeanella Craze neurology who will see patient -11/14 patient currently completing 24-hour EEG per neurology  Small subarachnoid hemorrhage -CT head reviewed with neurosurgery, Dr. Marcello Moores -It was not felt that surgical intervention was indicated at this time -Recommendations were to hold antiplatelet agents -Repeat CT head performed 11/11 showed that hemorrhage was stable  Chronic systolic congestive heart failure, EF of 45 to 50% -Strict in and out -3.5 L -Daily weight Filed Weights   02/17/20 0630 02/19/20 0500 02/26/20 1229  Weight: 86.2 kg 82.5 kg 77 kg  -Metoprolol IV 5 mg QID -Hold all other BP medication  Tachyarrhythmias  -Noted to have transient SVT versus A. fib with RVR -Cardiology did not recommend anticoagulation due to poor compliance and low A. fib burden as well as subarachnoid hemorrhage- -11/14 currently NSR  Acute on chronic hypoxic and hypercapnic respiratory failure -Felt to be related to COPD as well as opiate use -Followed by pulmonology -Intubated from 11/2-11/5 -Overall blood gas has improved -Respiratory acidosis is now compensated -Continue BiPAP nightly -Goal SPO2; 88 to 95% -Xopenex QID -Solu-Medrol 60 mg daily -11/14 continues to require significant oxygen support; see vent support below  COPD exacerbation -Treated  with steroids, bronchodilators -Followed by pulmonology  Staph epi bacteremia -Transthoracic echocardiogram  without evidence of endocarditis -Completed Ancef on 11/7   AKI (baseline Cr 1.5) Lab Results  Component Value Date   CREATININE 0.78 02/26/2020   CREATININE 0.73 02/24/2020   CREATININE 0.63 02/23/2020   CREATININE 0.55 (L) 02/22/2020   CREATININE 0.53 (L) 02/21/2020  -Better than baseline -Baseline creatinine 1.5 -On admission, creatinine noted to be elevated at 1.8 -AKI has resolved with hydration   Generalized weakness -Seen by physical therapy with recommendation for skilled nursing facility placement  Hypokalemia -Potassium goal> 4 -11/13 potassium IV 60 mEq  Goals of care -11/14 Consult Palliative Care; patient with multisystem organ failure currently no sign of improvement.  Make patient DNR discuss goals of care   DVT prophylaxis: SCD secondary subarachnoid hemorrhage Code Status: Full Family Communication:  Status is: Inpatient    Dispo: The patient is from: Transfer from [facility]              Anticipated d/c is to:??              Anticipated d/c date is:??  Awaiting neurology evaluation              Patient currently unstable      Consultants:  Cardiology PCCM Neurology  Procedures/Significant Events:    I have personally reviewed and interpreted all radiology studies and my findings are as above.  VENTILATOR SETTINGS: Ventimask 11/14 FiO2; 40% Flow; 8 L/min SPO2; 96%   Cultures   Antimicrobials: Anti-infectives (From admission, onward)   Start     Ordered Stop   02/15/20 2200  vancomycin (VANCOCIN) IVPB 1000 mg/200 mL premix  Status:  Discontinued        02/14/20 2149 02/15/20 1044   02/15/20 1130  ceFAZolin (ANCEF) IVPB 2g/100 mL premix        02/15/20 1044 02/20/20 0133   02/14/20 2200  metroNIDAZOLE (FLAGYL) IVPB 500 mg  Status:  Discontinued        02/14/20 2136 02/15/20 1433   02/14/20 2200  vancomycin (VANCOREADY) IVPB 1250 mg/250 mL        02/14/20 2148 02/15/20 0128   02/14/20 2200  ceFEPIme (MAXIPIME) 2 g  in sodium chloride 0.9 % 100 mL IVPB  Status:  Discontinued        02/14/20 2148 02/15/20 1044       Devices    LINES / TUBES:      Continuous Infusions: . dextrose 5 % and 0.45% NaCl 20 mL/hr at 02/25/20 1356  . famotidine (PEPCID) IV 20 mg (02/26/20 1027)  . lacosamide (VIMPAT) IV 200 mg (02/26/20 1128)  . levETIRAcetam 1,000 mg (02/26/20 1039)     Objective: Vitals:   02/26/20 0825 02/26/20 1226 02/26/20 1229 02/26/20 1405  BP: (!) 161/124 (!) 157/90    Pulse: 63 76    Resp: 18 20    Temp: 98.8 F (37.1 C) 98.2 F (36.8 C)    TempSrc: Oral Oral    SpO2: 100% 93%  96%  Weight:   77 kg   Height:   '6\' 1"'  (1.854 m)    No intake or output data in the 24 hours ending 02/26/20 1432 Filed Weights   02/17/20 0630 02/19/20 0500 02/26/20 1229  Weight: 86.2 kg 82.5 kg 77 kg   Physical Exam:  General: Awake babbles incoherently, positive acute respiratory distress, cachectic Eyes: negative scleral hemorrhage, negative anisocoria, negative icterus ENT: Negative Runny nose, negative gingival  bleeding, Neck:  Negative scars, masses, torticollis, lymphadenopathy, JVD Lungs: Clear to auscultation bilaterally without wheezes or crackles Cardiovascular: Regular rate and rhythm without murmur gallop or rub normal S1 and S2 Abdomen: negative abdominal pain, nondistended, positive soft, bowel sounds, no rebound, no ascites, no appreciable mass Extremities: No significant cyanosis, clubbing, or edema bilateral lower extremities Skin: Negative rashes, lesions, ulcers Psychiatric: Unable to assess Central nervous system: Withdraws mildly to painful stimuli  .     Data Reviewed: Care during the described time interval was provided by me .  I have reviewed this patient's available data, including medical history, events of note, physical examination, and all test results as part of my evaluation.  CBC: Recent Labs  Lab 02/21/20 0418 02/22/20 0432 02/23/20 0408 02/24/20 0233  02/26/20 0112  WBC 13.5* 10.3 11.2* 11.7* 16.5*  NEUTROABS  --   --   --   --  16.2*  HGB 16.8 16.2 16.0 15.9 17.6*  HCT 53.7* 52.4* 50.8 52.1* 56.6*  MCV 87.7 87.5 87.0 89.1 88.7  PLT 192 191 197 186 073   Basic Metabolic Panel: Recent Labs  Lab 02/21/20 0418 02/22/20 0432 02/23/20 0408 02/24/20 0233 02/26/20 0112  NA 144 139 142 141 143  K 3.4* 3.0* 2.4* 3.1* 4.5  CL 91* 89* 91* 93* 102  CO2 41* 41* 41* 38* 32  GLUCOSE 80 103* 95 98 125*  BUN '14 15 16 17 17  ' CREATININE 0.53* 0.55* 0.63 0.73 0.78  CALCIUM 9.7 9.6 9.6 9.5 10.9*  MG  --  1.8 1.9  --  1.9  PHOS  --   --   --   --  2.4*   GFR: Estimated Creatinine Clearance: 106.9 mL/min (by C-G formula based on SCr of 0.78 mg/dL). Liver Function Tests: Recent Labs  Lab 02/21/20 0418 02/23/20 0408 02/24/20 0233 02/26/20 0112  AST 82* 48* 42* 32  ALT 50* 47* 42 38  ALKPHOS 78 66 62 70  BILITOT 2.4* 2.3* 2.2* 2.1*  PROT 6.1* 5.8* 5.7* 6.3*  ALBUMIN 3.1* 3.0* 2.8* 3.0*   No results for input(s): LIPASE, AMYLASE in the last 168 hours. Recent Labs  Lab 02/24/20 0233  AMMONIA 53*   Coagulation Profile: No results for input(s): INR, PROTIME in the last 168 hours. Cardiac Enzymes: No results for input(s): CKTOTAL, CKMB, CKMBINDEX, TROPONINI in the last 168 hours. BNP (last 3 results) No results for input(s): PROBNP in the last 8760 hours. HbA1C: No results for input(s): HGBA1C in the last 72 hours. CBG: Recent Labs  Lab 02/25/20 1240 02/25/20 1705 02/25/20 2110 02/26/20 0623 02/26/20 1242  GLUCAP 104* 113* 106* 131* 113*   Lipid Profile: No results for input(s): CHOL, HDL, LDLCALC, TRIG, CHOLHDL, LDLDIRECT in the last 72 hours. Thyroid Function Tests: No results for input(s): TSH, T4TOTAL, FREET4, T3FREE, THYROIDAB in the last 72 hours. Anemia Panel: No results for input(s): VITAMINB12, FOLATE, FERRITIN, TIBC, IRON, RETICCTPCT in the last 72 hours. Sepsis Labs: Recent Labs  Lab 02/24/20 0233    LATICACIDVEN 0.9    No results found for this or any previous visit (from the past 240 hour(s)).       Radiology Studies: EEG LTVM - Continuous Bedside W/ Video Includes Portable EEG Read  Result Date: 02/26/2020 Lora Havens, MD     02/26/2020  8:57 AM Patient Name: DOMINION KATHAN MRN: [id number] Epilepsy Attending: Lora Havens Referring Physician/Provider: Dr Lynnae Sandhoff Duration: 02/25/2020 1805 to 02/26/2020 0900  Patient history: 59 year old male  with chronic right parietal and temporal strokes who presented with altered mental status.  EEG to evaluate for seizures.  Level of alertness: Awake,asleep  AEDs during EEG study: LEV, LCM  Technical aspects: This EEG study was done with scalp electrodes positioned according to the 10-20 International system of electrode placement. Electrical activity was acquired at a sampling rate of '500Hz'  and reviewed with a high frequency filter of '70Hz'  and a low frequency filter of '1Hz' . EEG data were recorded continuously and digitally stored.  Description: No clear posterior dominant rhythm was seen. Sleep was charcaterized by vertex waves, sleep spindles (12-'14hz' ), maximal frontocentral region.  EEG showed continuous generalized 3 to 6 Hz theta delta slowing admixed with 15-'18Hz'  frontocentral beta activity.  Hyperventilation and photic stimulation were not performed.    ABNORMALITY -Continuous slow, generalized  IMPRESSION: This study is suggestive of mild to moderate diffuse encephalopathy, nonspecific etiology. No seizures or epileptiform discharges were seen throughout the recording.  Priyanka [name]        Scheduled Meds: . chlorhexidine gluconate (MEDLINE KIT)  15 mL Mouth Rinse BID  . Chlorhexidine Gluconate Cloth  6 each Topical Daily  . feeding supplement  237 mL Oral TID BM  . influenza vac split quadrivalent PF  0.5 mL Intramuscular Tomorrow-1000  . insulin aspart  0-5 Units Subcutaneous QHS  . insulin aspart  0-6  Units Subcutaneous TID WC  . lactulose  300 mL Rectal BID  . levalbuterol  1.25 mg Nebulization Q6H  . mouth rinse  15 mL Mouth Rinse BID  . methylPREDNISolone (SOLU-MEDROL) injection  60 mg Intravenous Q24H  . metoprolol tartrate  5 mg Intravenous Q6H  . multivitamin with minerals  1 tablet Oral Daily  . pneumococcal 23 valent vaccine  0.5 mL Intramuscular Tomorrow-1000  . senna-docusate  2 tablet Oral BID   Continuous Infusions: . dextrose 5 % and 0.45% NaCl 20 mL/hr at 02/25/20 1356  . famotidine (PEPCID) IV 20 mg (02/26/20 1027)  . lacosamide (VIMPAT) IV 200 mg (02/26/20 1128)  . levETIRAcetam 1,000 mg (02/26/20 1039)     LOS: 12 days    Time spent:40 min    Levaeh Vice, Geraldo Docker, MD [facility] Pager [phone]  If 7PM-7AM, please contact night-coverage [email or web address] Password [facility] 02/26/2020, 2:32 PM

## 2020-02-27 ENCOUNTER — Inpatient Hospital Stay (HOSPITAL_COMMUNITY): Payer: Medicare Other

## 2020-02-27 ENCOUNTER — Encounter (HOSPITAL_COMMUNITY): Payer: Self-pay | Admitting: Internal Medicine

## 2020-02-27 DIAGNOSIS — R4182 Altered mental status, unspecified: Secondary | ICD-10-CM | POA: Diagnosis not present

## 2020-02-27 DIAGNOSIS — J9601 Acute respiratory failure with hypoxia: Secondary | ICD-10-CM | POA: Diagnosis not present

## 2020-02-27 DIAGNOSIS — R569 Unspecified convulsions: Secondary | ICD-10-CM

## 2020-02-27 DIAGNOSIS — I639 Cerebral infarction, unspecified: Secondary | ICD-10-CM | POA: Diagnosis not present

## 2020-02-27 LAB — GLUCOSE, CAPILLARY
Glucose-Capillary: 116 mg/dL — ABNORMAL HIGH (ref 70–99)
Glucose-Capillary: 116 mg/dL — ABNORMAL HIGH (ref 70–99)
Glucose-Capillary: 127 mg/dL — ABNORMAL HIGH (ref 70–99)
Glucose-Capillary: 180 mg/dL — ABNORMAL HIGH (ref 70–99)
Glucose-Capillary: 166 mg/dL — ABNORMAL HIGH (ref 70–99)

## 2020-02-27 LAB — LIPID PANEL
Cholesterol: 118 mg/dL (ref 0–200)
HDL: 39 mg/dL — ABNORMAL LOW (ref >40)
LDL Cholesterol: 57 mg/dL (ref 0–99)
Total CHOL/HDL Ratio: 3 RATIO
Triglycerides: 110 mg/dL (ref <150)
VLDL: 22 mg/dL (ref 0–40)

## 2020-02-27 LAB — POCT I-STAT 7, (LYTES, BLD GAS, ICA,H+H)
Acid-Base Excess: 11 mmol/L — ABNORMAL HIGH (ref 0.0–2.0)
Bicarbonate: 37.4 mmol/L — ABNORMAL HIGH (ref 20.0–28.0)
Calcium, Ion: 1.46 mmol/L — ABNORMAL HIGH (ref 1.15–1.40)
HCT: 53 % — ABNORMAL HIGH (ref 39.0–52.0)
Hemoglobin: 18 g/dL — ABNORMAL HIGH (ref 13.0–17.0)
O2 Saturation: 93 %
Patient temperature: 98.1
Potassium: 3.3 mmol/L — ABNORMAL LOW (ref 3.5–5.1)
Sodium: 145 mmol/L (ref 135–145)
TCO2: 39 mmol/L — ABNORMAL HIGH (ref 22–32)
pCO2 arterial: 49.7 mmHg — ABNORMAL HIGH (ref 32.0–48.0)
pH, Arterial: 7.483 — ABNORMAL HIGH (ref 7.350–7.450)
pO2, Arterial: 64 mmHg — ABNORMAL LOW (ref 83.0–108.0)

## 2020-02-27 LAB — CBC WITH DIFFERENTIAL/PLATELET
Abs Immature Granulocytes: 0.06 10*3/uL (ref 0.00–0.07)
Basophils Absolute: 0 10*3/uL (ref 0.0–0.1)
Basophils Relative: 0 %
Eosinophils Absolute: 0 10*3/uL (ref 0.0–0.5)
Eosinophils Relative: 0 %
HCT: 52.7 % — ABNORMAL HIGH (ref 39.0–52.0)
Hemoglobin: 16.5 g/dL (ref 13.0–17.0)
Immature Granulocytes: 0 %
Lymphocytes Relative: 4 %
Lymphs Abs: 0.5 10*3/uL — ABNORMAL LOW (ref 0.7–4.0)
MCH: 27 pg (ref 26.0–34.0)
MCHC: 31.3 g/dL (ref 30.0–36.0)
MCV: 86.1 fL (ref 80.0–100.0)
Monocytes Absolute: 1.2 10*3/uL — ABNORMAL HIGH (ref 0.1–1.0)
Monocytes Relative: 8 %
Neutro Abs: 12.5 10*3/uL — ABNORMAL HIGH (ref 1.7–7.7)
Neutrophils Relative %: 88 %
Platelets: 197 10*3/uL (ref 150–400)
RBC: 6.12 MIL/uL — ABNORMAL HIGH (ref 4.22–5.81)
RDW: 20.8 % — ABNORMAL HIGH (ref 11.5–15.5)
WBC: 14.3 10*3/uL — ABNORMAL HIGH (ref 4.0–10.5)
nRBC: 0 % (ref 0.0–0.2)

## 2020-02-27 LAB — COMPREHENSIVE METABOLIC PANEL
ALT: 37 U/L (ref 0–44)
AST: 31 U/L (ref 15–41)
Albumin: 2.9 g/dL — ABNORMAL LOW (ref 3.5–5.0)
Alkaline Phosphatase: 68 U/L (ref 38–126)
Anion gap: 13 (ref 5–15)
BUN: 19 mg/dL (ref 6–20)
CO2: 32 mmol/L (ref 22–32)
Calcium: 10.9 mg/dL — ABNORMAL HIGH (ref 8.9–10.3)
Chloride: 102 mmol/L (ref 98–111)
Creatinine, Ser: 0.93 mg/dL (ref 0.61–1.24)
GFR, Estimated: >60 mL/min (ref >60)
Glucose, Bld: 129 mg/dL — ABNORMAL HIGH (ref 70–99)
Potassium: 3.5 mmol/L (ref 3.5–5.1)
Sodium: 147 mmol/L — ABNORMAL HIGH (ref 135–145)
Total Bilirubin: 2.3 mg/dL — ABNORMAL HIGH (ref 0.3–1.2)
Total Protein: 6.1 g/dL — ABNORMAL LOW (ref 6.5–8.1)

## 2020-02-27 LAB — MAGNESIUM
Magnesium: 1.8 mg/dL (ref 1.7–2.4)
Magnesium: 1.7 mg/dL (ref 1.7–2.4)

## 2020-02-27 LAB — MRSA PCR SCREENING: MRSA by PCR: NEGATIVE

## 2020-02-27 LAB — PHOSPHORUS
Phosphorus: 2.7 mg/dL (ref 2.5–4.6)
Phosphorus: 1.8 mg/dL — ABNORMAL LOW (ref 2.5–4.6)

## 2020-02-27 LAB — PHENYTOIN LEVEL, TOTAL: Phenytoin Lvl: 17.8 ug/mL (ref 10.0–20.0)

## 2020-02-27 MED ORDER — ROCURONIUM BROMIDE 50 MG/5ML IV SOLN
100.0000 mg | Freq: Once | INTRAVENOUS | Status: AC
  Administered 2020-02-27: 80 mg via INTRAVENOUS
  Filled 2020-02-27: qty 10

## 2020-02-27 MED ORDER — NOREPINEPHRINE 4 MG/250ML-% IV SOLN
0.0000 ug/min | INTRAVENOUS | Status: DC
  Administered 2020-02-27: 4 ug/min via INTRAVENOUS

## 2020-02-27 MED ORDER — MIDAZOLAM HCL 2 MG/2ML IJ SOLN
4.0000 mg | Freq: Once | INTRAMUSCULAR | Status: AC
  Administered 2020-02-27: 4 mg via INTRAVENOUS

## 2020-02-27 MED ORDER — LEVALBUTEROL HCL 1.25 MG/0.5ML IN NEBU
1.2500 mg | INHALATION_SOLUTION | Freq: Four times a day (QID) | RESPIRATORY_TRACT | Status: DC | PRN
  Filled 2020-02-27: qty 0.5

## 2020-02-27 MED ORDER — ORAL CARE MOUTH RINSE
15.0000 mL | OROMUCOSAL | Status: DC
  Administered 2020-02-27 – 2020-03-06 (×78): 15 mL via OROMUCOSAL

## 2020-02-27 MED ORDER — PROSOURCE TF PO LIQD
45.0000 mL | Freq: Two times a day (BID) | ORAL | Status: DC
  Administered 2020-02-27: 45 mL
  Filled 2020-02-27: qty 45

## 2020-02-27 MED ORDER — SODIUM CHLORIDE 0.9 % IV SOLN: INTRAVENOUS | Status: DC | PRN

## 2020-02-27 MED ORDER — PHENYTOIN SODIUM 50 MG/ML IJ SOLN
100.0000 mg | Freq: Three times a day (TID) | INTRAMUSCULAR | Status: DC
  Administered 2020-02-27 – 2020-02-29 (×6): 100 mg via INTRAVENOUS
  Filled 2020-02-27 (×6): qty 2

## 2020-02-27 MED ORDER — NOREPINEPHRINE 4 MG/250ML-% IV SOLN
2.0000 ug/min | INTRAVENOUS | Status: DC
  Administered 2020-02-27: 4 ug/min via INTRAVENOUS

## 2020-02-27 MED ORDER — OSMOLITE 1.5 CAL PO LIQD
1000.0000 mL | ORAL | Status: DC
  Administered 2020-02-27 – 2020-03-06 (×6): 1000 mL
  Filled 2020-02-27 (×9): qty 1000

## 2020-02-27 MED ORDER — ROCURONIUM BROMIDE 50 MG/5ML IV SOLN
80.0000 mg | Freq: Once | INTRAVENOUS | Status: AC
  Filled 2020-02-27: qty 8

## 2020-02-27 MED ORDER — PROSOURCE TF PO LIQD
45.0000 mL | Freq: Three times a day (TID) | ORAL | Status: DC
  Administered 2020-02-27 – 2020-03-06 (×24): 45 mL
  Filled 2020-02-27 (×22): qty 45

## 2020-02-27 MED ORDER — MIDAZOLAM HCL 2 MG/2ML IJ SOLN: 4.0000 mg | Freq: Once | INTRAMUSCULAR | Status: AC

## 2020-02-27 MED ORDER — SODIUM CHLORIDE 0.9 % IV SOLN
15.0000 mg/kg | Freq: Once | INTRAVENOUS | Status: AC
  Administered 2020-02-27: 1155 mg via INTRAVENOUS
  Filled 2020-02-27: qty 23.1

## 2020-02-27 MED ORDER — CHLORHEXIDINE GLUCONATE 0.12% ORAL RINSE (MEDLINE KIT)
15.0000 mL | Freq: Two times a day (BID) | OROMUCOSAL | Status: DC
  Administered 2020-02-27 – 2020-03-09 (×21): 15 mL via OROMUCOSAL

## 2020-02-27 MED ORDER — LORAZEPAM 2 MG/ML IJ SOLN
1.0000 mg | Freq: Once | INTRAMUSCULAR | Status: AC
  Administered 2020-02-27: 1 mg via INTRAVENOUS
  Filled 2020-02-27: qty 1

## 2020-02-27 MED ORDER — VITAL HIGH PROTEIN PO LIQD: 1000.0000 mL | ORAL | Status: DC

## 2020-02-27 MED ORDER — LORAZEPAM 2 MG/ML IJ SOLN
1.0000 mg | Freq: Once | INTRAMUSCULAR | Status: AC | PRN
  Administered 2020-02-27: 1 mg via INTRAVENOUS

## 2020-02-27 MED ORDER — MIDAZOLAM 50MG/50ML (1MG/ML) PREMIX INFUSION
0.5000 mg/h | INTRAVENOUS | Status: DC
  Administered 2020-02-27 – 2020-02-29 (×8): 10 mg/h via INTRAVENOUS
  Filled 2020-02-27 (×8): qty 50

## 2020-02-27 MED ORDER — SODIUM CHLORIDE 0.9 % IV SOLN
250.0000 mL | INTRAVENOUS | Status: DC
  Administered 2020-02-27 – 2020-03-06 (×2): 250 mL via INTRAVENOUS

## 2020-02-27 MED ORDER — SODIUM CHLORIDE 0.9 % IV SOLN
15.0000 mg/kg | Freq: Once | INTRAVENOUS | Status: DC
  Filled 2020-02-27: qty 23.1

## 2020-02-27 MED ORDER — MIDAZOLAM BOLUS VIA INFUSION
10.0000 mg | Freq: Once | INTRAVENOUS | Status: AC
  Administered 2020-02-27: 10 mg via INTRAVENOUS
  Filled 2020-02-27: qty 10

## 2020-02-27 MED ORDER — INSULIN ASPART 100 UNIT/ML ~~LOC~~ SOLN
0.0000 [IU] | SUBCUTANEOUS | Status: DC
  Administered 2020-02-27 – 2020-03-04 (×7): 1 [IU] via SUBCUTANEOUS

## 2020-02-27 NOTE — Progress Notes (Signed)
RT obtained ABG on pt with the following results. RT will continue to monitor.   Results for Antonio Ramirez, Antonio Ramirez (MRN [id number]) as of 02/27/2020 05:23  Ref. Range 02/27/2020 04:06  Sample type Unknown ARTERIAL  pH, Arterial Latest Ref Range: 7.35 - 7.45  7.483 (H)  pCO2 arterial Latest Ref Range: 32 - 48 mmHg 49.7 (H)  pO2, Arterial Latest Ref Range: 83 - 108 mmHg 64 (L)  TCO2 Latest Ref Range: 22 - 32 mmol/L 39 (H)  Acid-Base Excess Latest Ref Range: 0.0 - 2.0 mmol/L 11.0 (H)  Bicarbonate Latest Ref Range: 20.0 - 28.0 mmol/L 37.4 (H)  O2 Saturation Latest Units: % 93.0  Patient temperature Unknown 98.1 F  Collection site Unknown Radial

## 2020-02-27 NOTE — Progress Notes (Signed)
SLP Cancellation Note  Patient Details Name: Antonio Ramirez MRN: [id number] DOB: 12-05-1960   Cancelled treatment:       Reason Eval/Treat Not Completed: Patient not medically ready. Pt became unresponsive and was intubated 11/15. SLP will continue to f/u and evaluate when medically ready.    Greggory Keen 02/27/2020, 7:53 AM

## 2020-02-27 NOTE — Progress Notes (Signed)
Patient with downward gaze and left arm twitching.  On call neurology paged and CCM also notified.  RN to continue to monitor.

## 2020-02-27 NOTE — Procedures (Signed)
Intubation Procedure Note  Antonio Ramirez  [id number]  11/18/1960  Date:02/27/20  Time:4:19 AM   Provider Performing:Jaylan Hinojosa Duwayne Heck    Procedure: Intubation (40981)  Indication(s) Respiratory Failure  Consent Unable to obtain consent due to emergent nature of procedure.   Anesthesia Versed and Rocuronium   Time Out Verified patient identification, verified procedure, site/side was marked, verified correct patient position, special equipment/implants available, medications/allergies/relevant history reviewed, required imaging and test results available.   Sterile Technique Usual hand hygeine, masks, and gloves were used   Procedure Description Patient positioned in bed supine.  Sedation given as noted above.  Patient was intubated with endotracheal tube using Glidescope.  View was Grade 1 full glottis .  Number of attempts was 1.  Colorimetric CO2 detector was consistent with tracheal placement.   Complications/Tolerance None; patient tolerated the procedure well. Chest X-ray is ordered to verify placement.   EBL Minimal   Specimen(s) None

## 2020-02-27 NOTE — Progress Notes (Signed)
Subjective: Patient had an episode of LUE twitching overnight, which resolved after Versed for intubation. Versed has not been continuted and patient had recurrence of seizure like activity with downward gaze and left arm twitching at about 7:50 AM. Per Dr. Hortense Ramal, EEG revealed right sided epileptiform discharges. Fosphenytoin load has been ordered.    Objective: Current vital signs: BP 133/77 (BP Location: Right Arm)   Pulse 73   Temp 97.6 F (36.4 C) (Axillary)   Resp 14   Ht 6' (1.829 m)   Wt 77 kg   SpO2 99%   BMI 23.02 kg/m  Vital signs in last 24 hours: Temp:  [97.6 F (36.4 C)-98.9 F (37.2 C)] 97.6 F (36.4 C) (11/15 0800) Pulse Rate:  [56-107] 73 (11/15 0800) Resp:  [5-23] 14 (11/15 0800) BP: (71-186)/(57-105) 133/77 (11/15 0800) SpO2:  [93 %-100 %] 99 % (11/15 0800) FiO2 (%):  [40 %-60 %] 60 % (11/15 0748) Weight:  [77 kg] 77 kg (11/15 0500)  Intake/Output from previous day: 11/14 0701 - 11/15 0700 In: 1450.3 [I.V.:520.1; IV Piggyback:930.2] Out: 700 [Urine:700] Intake/Output this shift: Total I/O In: 10 [I.V.:10] Out: -  Nutritional status:  Diet Order            Diet NPO time specified  Diet effective now                Gen: Intubated. Ext: No rash noted.  Neurologic Exam: Ment: Eyes closed. Obtunded with some evidence for agitation when stimulated, consisting of tremulous movements of RUE and slight head movement. No purposeful movements. Does not alert to voice. No attempts to communicate. CN: Pupils equal and sluggishly reactive 4 mm >> 3 mm. Weak eye blink to eyelid stimulation. Unable to elicit oculocephalic reflex. Face flaccidly symmetric. Spontaneously gags on ET tube.  Motor/Sensory: RUE extensor posturing at baseline worsens with noxious stimuli, but does not withdraw.  LUE with weak, slow withdrawal to noxious 2/5.  BLE with dorsiflexion of feet and toes to plantar stimulation.  Reflexes: 3+ bilateral brachioradialis. 4+ right patellar, 3+  left patellar. Toes upgoing.   Lab Results: Results for orders placed or performed during the hospital encounter of 02/14/20 (from the past 48 hour(s))  Glucose, capillary     Status: Abnormal   Collection Time: 02/25/20 12:05 PM  Result Value Ref Range   Glucose-Capillary 115 (H) 70 - 99 mg/dL    Comment: Glucose reference range applies only to samples taken after fasting for at least 8 hours.  Glucose, capillary     Status: Abnormal   Collection Time: 02/25/20 12:40 PM  Result Value Ref Range   Glucose-Capillary 104 (H) 70 - 99 mg/dL    Comment: Glucose reference range applies only to samples taken after fasting for at least 8 hours.  Glucose, capillary     Status: Abnormal   Collection Time: 02/25/20  5:05 PM  Result Value Ref Range   Glucose-Capillary 113 (H) 70 - 99 mg/dL    Comment: Glucose reference range applies only to samples taken after fasting for at least 8 hours.  Glucose, capillary     Status: Abnormal   Collection Time: 02/25/20  9:10 PM  Result Value Ref Range   Glucose-Capillary 106 (H) 70 - 99 mg/dL    Comment: Glucose reference range applies only to samples taken after fasting for at least 8 hours.  Comprehensive metabolic panel     Status: Abnormal   Collection Time: 02/26/20  1:12 AM  Result Value Ref Range  Sodium 143 135 - 145 mmol/L   Potassium 4.5 3.5 - 5.1 mmol/L   Chloride 102 98 - 111 mmol/L   CO2 32 22 - 32 mmol/L   Glucose, Bld 125 (H) 70 - 99 mg/dL    Comment: Glucose reference range applies only to samples taken after fasting for at least 8 hours.   BUN 17 6 - 20 mg/dL   Creatinine, Ser 0.78 0.61 - 1.24 mg/dL   Calcium 10.9 (H) 8.9 - 10.3 mg/dL   Total Protein 6.3 (L) 6.5 - 8.1 g/dL   Albumin 3.0 (L) 3.5 - 5.0 g/dL   AST 32 15 - 41 U/L   ALT 38 0 - 44 U/L   Alkaline Phosphatase 70 38 - 126 U/L   Total Bilirubin 2.1 (H) 0.3 - 1.2 mg/dL   GFR, Estimated >60 >60 mL/min    Comment: (NOTE) Calculated using the CKD-EPI Creatinine Equation  (2021)    Anion gap 9 5 - 15    Comment: Performed at [facility], [place] [address], [place], [place] [address]  Magnesium     Status: None   Collection Time: 02/26/20  1:12 AM  Result Value Ref Range   Magnesium 1.9 1.7 - 2.4 mg/dL    Comment: Performed at [facility] [address], [place], [place] [address]  Phosphorus     Status: Abnormal   Collection Time: 02/26/20  1:12 AM  Result Value Ref Range   Phosphorus 2.4 (L) 2.5 - 4.6 mg/dL    Comment: Performed at [facility] [address] [place], [place] [address]  CBC with Differential/Platelet     Status: Abnormal   Collection Time: 02/26/20  1:12 AM  Result Value Ref Range   WBC 16.5 (H) 4.0 - 10.5 K/uL   RBC 6.38 (H) 4.22 - 5.81 MIL/uL   Hemoglobin 17.6 (H) 13.0 - 17.0 g/dL   HCT 56.6 (H) 39 - 52 %   MCV 88.7 80.0 - 100.0 fL   MCH 27.6 26.0 - 34.0 pg   MCHC 31.1 30.0 - 36.0 g/dL   RDW 20.5 (H) 11.5 - 15.5 %   Platelets 172 150 - 400 K/uL   nRBC 0.0 0.0 - 0.2 %   Neutrophils Relative % 98 %   Neutro Abs 16.2 (H) 1.7 - 7.7 K/uL   Lymphocytes Relative 1 %   Lymphs Abs 0.2 (L) 0.7 - 4.0 K/uL   Monocytes Relative 0 %   Monocytes Absolute 0.1 0.1 - 1.0 K/uL   Eosinophils Relative 0 %   Eosinophils Absolute 0.0 0.0 - 0.5 K/uL   Basophils Relative 0 %   Basophils Absolute 0.0 0.0 - 0.1 K/uL   Immature Granulocytes 1 %   Abs Immature Granulocytes 0.09 (H) 0.00 - 0.07 K/uL    Comment: Performed at [facility] [address] [place], [place] [address]  Glucose, capillary     Status: Abnormal   Collection Time: 02/26/20  6:23 AM  Result Value Ref Range   Glucose-Capillary 131 (H) 70 - 99 mg/dL    Comment: Glucose reference range applies only to samples taken after fasting for at least 8 hours.   Comment 1 Notify RN    Comment 2 Document in Chart   Glucose, capillary     Status: Abnormal   Collection Time: 02/26/20 12:42 PM  Result Value Ref Range   Glucose-Capillary 113 (H) 70 - 99 mg/dL     Comment: Glucose reference  range applies only to samples taken after fasting for at least 8 hours.  Glucose, capillary     Status: Abnormal   Collection Time: 02/26/20  5:02 PM  Result Value Ref Range   Glucose-Capillary 107 (H) 70 - 99 mg/dL    Comment: Glucose reference range applies only to samples taken after fasting for at least 8 hours.  Glucose, capillary     Status: Abnormal   Collection Time: 02/26/20 11:27 PM  Result Value Ref Range   Glucose-Capillary 123 (H) 70 - 99 mg/dL    Comment: Glucose reference range applies only to samples taken after fasting for at least 8 hours.  I-STAT 7, (LYTES, BLD GAS, ICA, H+H)     Status: Abnormal   Collection Time: 02/27/20  4:06 AM  Result Value Ref Range   pH, Arterial 7.483 (H) 7.35 - 7.45   pCO2 arterial 49.7 (H) 32 - 48 mmHg   pO2, Arterial 64 (L) 83 - 108 mmHg   Bicarbonate 37.4 (H) 20.0 - 28.0 mmol/L   TCO2 39 (H) 22 - 32 mmol/L   O2 Saturation 93.0 %   Acid-Base Excess 11.0 (H) 0.0 - 2.0 mmol/L   Sodium 145 135 - 145 mmol/L   Potassium 3.3 (L) 3.5 - 5.1 mmol/L   Calcium, Ion 1.46 (H) 1.15 - 1.40 mmol/L   HCT 53.0 (H) 39 - 52 %   Hemoglobin 18.0 (H) 13.0 - 17.0 g/dL   Patient temperature 98.1 F    Collection site Radial    Drawn by RT    Sample type ARTERIAL   Glucose, capillary     Status: Abnormal   Collection Time: 02/27/20  8:02 AM  Result Value Ref Range   Glucose-Capillary 116 (H) 70 - 99 mg/dL    Comment: Glucose reference range applies only to samples taken after fasting for at least 8 hours.    No results found for this or any previous visit (from the past 240 hour(s)).  Lipid Panel No results for input(s): CHOL, TRIG, HDL, CHOLHDL, VLDL, LDLCALC in the last 72 hours.  Studies/Results: CT ANGIO HEAD W OR WO CONTRAST  Result Date: 02/26/2020 CLINICAL DATA:  Subarachnoid hemorrhage follow up.  Stroke/TIA. EXAM: CT ANGIOGRAPHY HEAD AND NECK TECHNIQUE: Multidetector CT imaging of the head and neck was performed  using the standard protocol during bolus administration of intravenous contrast. Multiplanar CT image reconstructions and MIPs were obtained to evaluate the vascular anatomy. Carotid stenosis measurements (when applicable) are obtained utilizing NASCET criteria, using the distal internal carotid diameter as the denominator. CONTRAST:  37m OMNIPAQUE IOHEXOL 350 MG/ML SOLN COMPARISON:  02/23/2020 FINDINGS: CT HEAD FINDINGS Brain: Unchanged small volume subarachnoid hemorrhage over the right convexity. There is also small amount of subarachnoid blood at the right frontal pole, also unchanged. The size and configuration of the ventricles and extra-axial CSF spaces are normal. 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 The brain parenchyma is normal. Skull: The visualized skull base, calvarium and extracranial soft tissues are normal. Sinuses/Orbits: No fluid levels or advanced mucosal thickening of the visualized paranasal sinuses. No mastoid or middle ear effusion. The orbits are normal. CTA NECK FINDINGS SKELETON: There is no bony spinal canal stenosis. No lytic or blastic lesion. OTHER NECK: Normal pharynx, larynx and major salivary glands. No cervical lymphadenopathy. Unremarkable thyroid gland. UPPER CHEST: No pneumothorax or pleural effusion. No nodules or masses. AORTIC ARCH: There is calcific atherosclerosis of the aortic arch. There is no aneurysm, dissection or hemodynamically significant stenosis of the visualized  portion of the aorta. Conventional 3 vessel aortic branching pattern. The visualized proximal subclavian arteries are widely patent. RIGHT CAROTID SYSTEM: No dissection, occlusion or aneurysm. Mild atherosclerotic calcification at the carotid bifurcation without hemodynamically significant stenosis. LEFT CAROTID SYSTEM: Normal without aneurysm, dissection or stenosis. VERTEBRAL ARTERIES: Left dominant configuration. Both origins are clearly patent. There is no dissection, occlusion or flow-limiting  stenosis to the skull base (V1-V3 segments). CTA HEAD FINDINGS POSTERIOR CIRCULATION: --Vertebral arteries: Normal V4 segments. --Inferior cerebellar arteries: Normal. --Basilar artery: Normal. --Superior cerebellar arteries: Normal. --Posterior cerebral arteries (PCA): Normal. ANTERIOR CIRCULATION: --Intracranial internal carotid arteries: Normal. --Anterior cerebral arteries (ACA): Normal. Both A1 segments are present. Patent anterior communicating artery (a-comm). --Middle cerebral arteries (MCA): Normal. VENOUS SINUSES: As permitted by contrast timing, patent. ANATOMIC VARIANTS: None Review of the MIP images confirms the above findings. IMPRESSION: 1. Unchanged small volume subarachnoid hemorrhage over the right convexity and at the right frontal pole. 2. No intracranial arterial occlusion or high-grade stenosis. 3. 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 Aortic Atherosclerosis (ICD10-I70.0). Electronically Signed   By: Ulyses Jarred M.D.   On: 02/26/2020 21:25   DG Abd 1 View  Result Date: 02/26/2020 CLINICAL DATA:  Preprocedure evaluation for MRI EXAM: ABDOMEN - 1 VIEW COMPARISON:  06/29/2019 FINDINGS: Supine frontal views of the abdomen and pelvis are obtained, excluding portions of the left flank by collimation. Minimal hypoventilatory change at the left lung base. Postsurgical changes from median sternotomy, with wires and surgical clips identified in the lower mediastinum. No bowel obstruction or ileus. Excreted contrast from previously performed CT angiogram. No radiopaque foreign bodies within the abdomen or pelvis. No acute bony abnormalities. IMPRESSION: 1. Postsurgical changes from median sternotomy, with mediastinal clips and median sternotomy wires. 2. No radiopaque foreign bodies within the abdomen or pelvis. Electronically Signed   By: Randa Ngo M.D.   On: 02/26/2020 21:22   CT HEAD WO CONTRAST  Result Date: 02/27/2020 CLINICAL DATA:  Acute respiratory failure, seizure EXAM: CT HEAD  WITHOUT CONTRAST TECHNIQUE: Contiguous axial images were obtained from the base of the skull through the vertex without intravenous contrast. COMPARISON:  MRI 02/26/2020 FINDINGS: Brain: Moderate parenchymal volume loss again noted, stable since prior examination. Mild periventricular white matter changes are present likely reflecting the sequela of small vessel ischemia. Remote right temporoparietal infarct again noted. Small subarachnoid hemorrhage within the right parietal and right frontal sulci appears stable since prior examination. No interval hemorrhage. No abnormal mass effect or midline shift. Ventricular size is normal. Cerebellum is unremarkable. Vascular: No asymmetric hyperdense vasculature at the skull base. Skull: Intact Sinuses/Orbits: The visualized paranasal sinuses are clear. The visualized orbits are unremarkable. Other: Mastoid air cells and middle ear cavities are clear IMPRESSION: Stable small subarachnoid hemorrhage. No evidence of interval hemorrhage. No abnormal mass effect or midline shift. Remote right parietotemporal infarct again noted. Electronically Signed   By: Fidela Salisbury MD   On: 02/27/2020 04:45   CT ANGIO NECK W OR WO CONTRAST  Result Date: 02/26/2020 CLINICAL DATA:  Subarachnoid hemorrhage follow up.  Stroke/TIA. EXAM: CT ANGIOGRAPHY HEAD AND NECK TECHNIQUE: Multidetector CT imaging of the head and neck was performed using the standard protocol during bolus administration of intravenous contrast. Multiplanar CT image reconstructions and MIPs were obtained to evaluate the vascular anatomy. Carotid stenosis measurements (when applicable) are obtained utilizing NASCET criteria, using the distal internal carotid diameter as the denominator. CONTRAST:  67m OMNIPAQUE IOHEXOL 350 MG/ML SOLN COMPARISON:  02/23/2020 FINDINGS: CT HEAD FINDINGS Brain: Unchanged  small volume subarachnoid hemorrhage over the right convexity. There is also small amount of subarachnoid blood at the  right frontal pole, also unchanged. The size and configuration of the ventricles and extra-axial CSF spaces are normal. 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 The brain parenchyma is normal. Skull: The visualized skull base, calvarium and extracranial soft tissues are normal. Sinuses/Orbits: No fluid levels or advanced mucosal thickening of the visualized paranasal sinuses. No mastoid or middle ear effusion. The orbits are normal. CTA NECK FINDINGS SKELETON: There is no bony spinal canal stenosis. No lytic or blastic lesion. OTHER NECK: Normal pharynx, larynx and major salivary glands. No cervical lymphadenopathy. Unremarkable thyroid gland. UPPER CHEST: No pneumothorax or pleural effusion. No nodules or masses. AORTIC ARCH: There is calcific atherosclerosis of the aortic arch. There is no aneurysm, dissection or hemodynamically significant stenosis of the visualized portion of the aorta. Conventional 3 vessel aortic branching pattern. The visualized proximal subclavian arteries are widely patent. RIGHT CAROTID SYSTEM: No dissection, occlusion or aneurysm. Mild atherosclerotic calcification at the carotid bifurcation without hemodynamically significant stenosis. LEFT CAROTID SYSTEM: Normal without aneurysm, dissection or stenosis. VERTEBRAL ARTERIES: Left dominant configuration. Both origins are clearly patent. There is no dissection, occlusion or flow-limiting stenosis to the skull base (V1-V3 segments). CTA HEAD FINDINGS POSTERIOR CIRCULATION: --Vertebral arteries: Normal V4 segments. --Inferior cerebellar arteries: Normal. --Basilar artery: Normal. --Superior cerebellar arteries: Normal. --Posterior cerebral arteries (PCA): Normal. ANTERIOR CIRCULATION: --Intracranial internal carotid arteries: Normal. --Anterior cerebral arteries (ACA): Normal. Both A1 segments are present. Patent anterior communicating artery (a-comm). --Middle cerebral arteries (MCA): Normal. VENOUS SINUSES: As permitted by contrast timing,  patent. ANATOMIC VARIANTS: None Review of the MIP images confirms the above findings. IMPRESSION: 1. Unchanged small volume subarachnoid hemorrhage over the right convexity and at the right frontal pole. 2. No intracranial arterial occlusion or high-grade stenosis. 3. 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 Aortic Atherosclerosis (ICD10-I70.0). Electronically Signed   By: Ulyses Jarred M.D.   On: 02/26/2020 21:25   MR BRAIN W WO CONTRAST  Result Date: 02/26/2020 CLINICAL DATA:  Subarachnoid hemorrhage follow up EXAM: MRI HEAD WITHOUT AND WITH CONTRAST TECHNIQUE: Multiplanar, multiecho pulse sequences of the brain and surrounding structures were obtained without and with intravenous contrast. CONTRAST:  7.68m GADAVIST GADOBUTROL 1 MMOL/ML IV SOLN COMPARISON:  Head CT 02/26/2020 FINDINGS: Brain: Small amount of subarachnoid hemorrhage again demonstrated over the right frontal pole and superior right parietal lobe. Multifocal hyperintense T2-weighted signal within the white matter. 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 Advanced atrophy for age. There are areas of leptomeningeal contrast enhancement in the left cerebellum and within the superior cerebral hemispheres. Normal midline structures. Vascular: Normal flow voids. Skull and upper cervical spine: Normal marrow signal. Sinuses/Orbits: Negative. Other: None. IMPRESSION: 1. Small amount of subarachnoid hemorrhage over the right frontal pole and superior right parietal lobe. 2. 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 3. Advanced atrophy and chronic microvascular disease. 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 Electronically Signed   By: KUlyses JarredM.D.   On: 02/26/2020 23:29   Portable Chest x-ray  Result Date: 02/27/2020 CLINICAL DATA:  Endotracheal tube placement EXAM: PORTABLE CHEST 1 VIEW COMPARISON:  02/18/2020 FINDINGS: Endotracheal  tube tip is at the level of the clavicular heads. Enteric tube side port projects near the gastric pylorus. The tip is below the field of view. Lungs are hyperinflated. No focal airspace consolidation or pulmonary edema. IMPRESSION: Endotracheal tube tip at  the level of the clavicular heads. Electronically Signed   By: Ulyses Jarred M.D.   On: 02/27/2020 03:26   EEG LTVM - Continuous Bedside W/ Video Includes Portable EEG Read  Result Date: 02/26/2020 Lora Havens, MD     02/26/2020  8:57 AM Patient Name: Antonio Ramirez MRN: [id number] Epilepsy Attending: Lora Havens Referring Physician/Provider: Dr Lynnae Sandhoff Duration: 02/25/2020 1805 to 02/26/2020 0900  Patient history: 59 year old male with chronic right parietal and temporal strokes who presented with altered mental status.  EEG to evaluate for seizures.  Level of alertness: Awake,asleep  AEDs during EEG study: LEV, LCM  Technical aspects: This EEG study was done with scalp electrodes positioned according to the 10-20 International system of electrode placement. Electrical activity was acquired at a sampling rate of '500Hz'  and reviewed with a high frequency filter of '70Hz'  and a low frequency filter of '1Hz' . EEG data were recorded continuously and digitally stored.  Description: No clear posterior dominant rhythm was seen. Sleep was charcaterized by vertex waves, sleep spindles (12-'14hz' ), maximal frontocentral region.  EEG showed continuous generalized 3 to 6 Hz theta delta slowing admixed with 15-'18Hz'  frontocentral beta activity.  Hyperventilation and photic stimulation were not performed.    ABNORMALITY -Continuous slow, generalized  IMPRESSION: This study is suggestive of mild to moderate diffuse encephalopathy, nonspecific etiology. No seizures or epileptiform discharges were seen throughout the recording.  Priyanka [name]    Medications:  Scheduled: . chlorhexidine gluconate (MEDLINE KIT)  15 mL Mouth Rinse BID  .  Chlorhexidine Gluconate Cloth  6 each Topical Daily  . influenza vac split quadrivalent PF  0.5 mL Intramuscular Tomorrow-1000  . insulin aspart  0-5 Units Subcutaneous QHS  . insulin aspart  0-6 Units Subcutaneous TID WC  . lactulose  300 mL Rectal BID  . levalbuterol  1.25 mg Nebulization Q6H  . mouth rinse  15 mL Mouth Rinse 10 times per day  . methylPREDNISolone (SOLU-MEDROL) injection  60 mg Intravenous Q24H  . metoprolol tartrate  5 mg Intravenous Q6H  . phenytoin (DILANTIN) IV  100 mg Intravenous Q8H  . pneumococcal 23 valent vaccine  0.5 mL Intramuscular Tomorrow-1000   Continuous: . sodium chloride 10 mL/hr at 02/27/20 0800  . sodium chloride    . dextrose 5 % and 0.45% NaCl Stopped (02/26/20 1027)  . famotidine (PEPCID) IV 20 mg (02/26/20 2326)  . lacosamide (VIMPAT) IV Stopped (02/27/20 0003)  . levETIRAcetam 1,000 mg (02/27/20 0802)  . norepinephrine (LEVOPHED) Adult infusion Stopped (02/27/20 0323)     Assessment: 59 y.o. male with history of CAD, COPD, opioid dependency and tobacco use admitted to [facility] on 02/14/20 with AMS, hypoxia, hypotension and was intubated (extubated 02/17/20).  Hospital course at [facility] complicated by Staph Epi bacteremia, AKI, cardiology consult for SVT (probable rapid afib per Dr Domenic Polite), fluid overload, EF 45-50% by echo, elevated LFTs, previous Rt MCA infarct by CT 02/18/20, seizure activity noted 02/22/20, small SAH on CT (NS consult Dr Duffy Rhody), EEG 11/10, Neurology consult 11/10 Dr [name] -> Keppra, recurrent seizures - tx'd to [facility] 02/25/20.  Small SAH - right frontoparietal region, source unclear - needs to rule out endocarditis  CT Head - 11/12 - Stable small volume frontal and parietal subarachnoid hemorrhage. Unchanged right temporoparietal encephalomalacia remained to remote cortical infarct.   CT head - 11/11 - Stable subarachnoid hemorrhage in the right anterior mid parietal lobe regions.   CTA head and neck 1. Unchanged small  volume subarachnoid hemorrhage  over the right convexity and at the right frontal pole. No intracranial arterial occlusion or high-grade stenosis. 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  MRI head with and without contrast: Small amount of subarachnoid hemorrhage over the right frontal pole and superior right parietal lobe. 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 Advanced atrophy and chronic microvascular disease. 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  2D Echo - EF 45 to 50%. No cardiac source of emboli identified.   VTE prophylaxis - SCDs  Aspirin 81 mg daily and clopidogrel 75 mg daily prior to admission, now on No antithrombotic.   Seizure   11/10 episode of right sided shaking followed by right sided hemiplegia. 11/12 overnight more episode of right sided tonic clonic activity, and more altered. EEG - 11/14 showed continuous slow, generalized. No seizures or epileptiform discharges were seen throughout the recording. Was loaded with keppra and keppra 510m Q8h->10080mQ8h. Vimpat 20076mid also started. Overnight Sun-Mon 11/15 the patient exhibited intermittent LUE twitching concerning for seizure recurrence. Reintubated overnight (11/15) for airway protection due to seizure activity concerning for possible status epilepticus, with associated obtundation and unresponsiveness between bouts of seizure activity. Of note, the seizure acitivity resolved after Versed for intubation. LTM EEG was started. Versed has not been continued and patient had a second recurrence of seizure like activity with downward gaze and left arm twitching at about 7:50 AM. Per Dr. YadHortense RamalEG revealed right sided epileptiform discharges. Fosphenytoin load was ordered and Dilantin has been ordered 100 mg TID.  Now on Keppra, Vimpat and Dilantin  Tachyarrhythmia   Noted SVT vs. Afib RVR in [facility] then [facility].  Was not an AC candidate for now given substance use, noncompliance and current SAH  Will need follow up with neurology as outpatient to see if he is a candidate later  Hypercarbic respiratory failure Sepsis with leukocytosis COPD exacerbation  Reintubated overnight (11/15) - see above  On solumedrol  Management and blood culture follow up per CCM  Cardiomyopathy Needs to rule out endocarditis  EF 55-60% in 05/2019  EF 45-50% this admission  Given SAH and leukocytosis - Repeat B Cx was ordered 11/14  If B Cx positive, will need TEE to rule out endocarditis  Hypertension  Home BP meds: none  Current BP meds: metoprolol  Blood pressure high at times  Add amlodipine  BP goal < 160   Long-term BP goal normotensive  Hyperlipidemia  Home Lipid lowering medication: Lipitor 40 mg daily  LDL pending, goal < 70  [facility]  Dysphagia   Did not pass swallow  On TF  Speech to follow  Tobacco abuse  Current smoker  Smoking cessation counseling will be provided  Other Stroke Risk Factors  Advanced age  Hx stroke/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      LOS: 13 days   '@Electronically'  signed: Dr. EriKerney Elbe/15/2021  8:31 AM

## 2020-02-27 NOTE — Progress Notes (Signed)
Patient began head jerking at 0104.

## 2020-02-27 NOTE — Progress Notes (Signed)
Pt transported from 4N25 to CT and back w/out complication. Pt respiratory status stable throughout transport. RT will continue to monitor.

## 2020-02-27 NOTE — Progress Notes (Signed)
Stat EEG completed, LTM started. Electrodes secured with glue. No skin break down noted. RN educated regarding pt event button. Dr Hortense Ramal and Dr Lorrin Goodell notified.

## 2020-02-27 NOTE — Progress Notes (Signed)
NAME:  Antonio Ramirez, MRN:  [id number], DOB:  Nov 27, 1960, LOS: 89 ADMISSION DATE:  02/14/2020, CONSULTATION DATE:  02/27/20 REFERRING MD: Marlowe Sax CHIEF COMPLAINT:  Acute encephalopathy, seizures, status epilepticus  Brief History   59 year old man with COPD, hx of Drug abuse, admitted with hypercarbic respiratory failure, intubated in the ER11/2, not responsive to narcan.     History of present illness    59 y.o. male with medical history significant for opiate dependence, COPD, carotid artery disease, depression.  Initially admitted with AMS on 11/2.  No respiratory symptoms, but hypoxic to 70s at the time of admission on RA.  No known abuse of medications.  Took oxycodone per schedule pta.   Received 2 doses of [place] Vaccine- April/May. He was intubated in the ED for acute hypoxic and hypercarbic respiratory failure. Extubated 11/5.  Remained sleepy 11/6-11/7, intermittent agitation 11/8 Increasingly alert 11/9 after bipap.  11/10 per cards note not following commands, jerking on R side.  Started on Keppra bolus and then 500 mg TID.  11/10 non responsive per neuro note. 11/11 still lethargic.  11/12persistent seizures, sent to [facility] for cont EEG.  11/13 transferred from [facility], needed increased O2 (16L NRB).  Withdrawing to stimuti. Persistent seizures, babbling incoherently.  11/14 am - able to say name, but + dysarthria. Awake but incoherent, not following commands, withdrawing to stimuli.  Completed 24 hr EEG. "IMPRESSION: This study is suggestive of mild to moderate diffuse encephalopathy, nonspecific etiology.No seizures or epileptiform discharges were seen throughout the recording." Antiplt agents held.   11/15: developed status epilepticus, no improvement with ativan 41m x 3 (?), completely obtunded and non responsive between bouts of seizure activity. Non responsive to any stimuli on my exam.  Intubated for airway protection.     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 No evidence of interval hemorrhage. No abnormal mass effect or midline shift. Remote right parietotemporal infarct again noted.   Micro Data:  Urine 11/2  > neg  BC x 2   11/2 >>> BC x 2  11/4 >>> MRSA PCR  11/3  Neg    Antimicrobials:  maxepime  11/2 x1 Flagyl  11/2 -11/3 Vanc  11/2x1 Ancef 11/3 >>>11/7   Scheduled Meds: . chlorhexidine gluconate (MEDLINE KIT)  15 mL Mouth Rinse BID  . Chlorhexidine Gluconate Cloth  6 each Topical Daily  . influenza vac split quadrivalent PF  0.5 mL Intramuscular Tomorrow-1000  . insulin aspart  0-5 Units Subcutaneous QHS  . insulin aspart  0-6 Units Subcutaneous TID WC  . lactulose  300 mL Rectal BID  . levalbuterol  1.25 mg Nebulization Q6H  . LORazepam  1 mg Intravenous Once  . mouth rinse  15 mL Mouth Rinse BID  . methylPREDNISolone (SOLU-MEDROL) injection  60  mg Intravenous Q24H  . metoprolol tartrate  5 mg Intravenous Q6H  . pneumococcal 23 valent vaccine  0.5 mL Intramuscular Tomorrow-1000   Continuous Infusions: . sodium chloride    . sodium chloride    . dextrose 5 % and 0.45% NaCl 20 mL/hr at 02/25/20 1356  . famotidine (PEPCID) IV 20 mg (02/26/20 2326)  . fosPHENYtoin (CEREBYX) IV    . lacosamide (VIMPAT) IV 200 mg (02/26/20 2332)  . levETIRAcetam 1,000 mg (02/27/20  0132)  . norepinephrine (LEVOPHED) Adult infusion 4 mcg/min (02/27/20 0306)  . norepinephrine (LEVOPHED) Adult infusion      Interim history/subjective:  Called to evaluate patient for ICU and possible intubation, had been on bipap, worsening AMS, persistent seizure activity.   Objective   Blood pressure 93/72, pulse 69, temperature 98.1 F (36.7 C), temperature source Oral, resp. rate (!) 23, height 6' (1.829 m), weight 77 kg, SpO2 97 %.    Vent Mode: PRVC FiO2 (%):  [40 %-60 %] 40 % Set Rate:  [16 bmp] 16 bmp Vt Set:  [620 mL] 620 mL PEEP:  [5 cmH20] 5 cmH20 Plateau Pressure:  [27 cmH20] 27 cmH20   Intake/Output Summary (Last 24 hours) at 02/27/2020 0308 Last data filed at 02/26/2020 2023 Gross per 24 hour  Intake --  Output 300 ml  Net -300 ml   Filed Weights   02/17/20 0630 02/19/20 0500 02/26/20 1229  Weight: 86.2 kg 82.5 kg 77 kg    Examination: Gen: completely non responsive, LUE rhythmic jerking No jvd Oropharynx mucosa dry, poor dentition Neck supple Lungs with disant bs bilaterally/ prolonged exp  RRR no s3 or or sign murmur Abd NT, ND, NBS Extr warm with no edema or clubbing noted    CXR: Endotracheal tube tip is at the level of the clavicular heads. Enteric tube side port projects near the gastric pylorus. The tip is below the field of view.  Lungs are hyperinflated. No focal airspace consolidation or pulmonary edema.  Resolved Hospital Problem list      Assessment & Plan:  #Status epilepticus Intubate for airway protection.  Higher risk intubation given respiratory failure, COPD.   Treat per neurology: intubated with versed but unfortunately needed paralytic as well.  Last known K 24 hours ago, with frequent seizures since, will use roc for paralytic if needed.  Presume persistent seizures for 1 hour after roc.   Fosphenytoin pending  Keppra, started 11/11.  vimpat.  - started 11/12.  Versed for sedation (hypotensive after intubation).   Transition to propofol if BP improves.   #COPD, acute hypoxemic hypercarbic (?) respiratory failure:  Severe disease.  8cc/kg, high peak pressures, plateau 24.   ABG in 1/2 hr.  On Methylprednisolone 60 daily.  patient does have notably poor air movement and prolonged exp phase.  Duonebs  #hypotension:  Monitor closely.  Post intubation.  Levophed PRN Appears well hydrated after bolus, good UOP, HFrEF, caution with additional fluids.   Giving 1 bolus.    # aki; RESOLVED  #Elevated LFTs resolved  #Cor pulmonale by Echo this admit with neg perfusion scan ruling out CTEPH.  Who III so rx is treat the underlying condition (COPD) and make sure 02 sats stay > 90% at all times.  #hx svt afib paroxysmal.    Best practice:  Diet: NPO for now, tube feeds in AM Pain/Anxiety/Delirium protocol (if indicated): Versed, fent VAP protocol (if indicated):  DVT prophylaxis:  lovenox GI prophylaxis: pepcid Glucose control: ISS prn Mobility: bedrest Code  Status: Full Code  Family Communication: Disposition: ICU  Labs   CBC: Recent Labs  Lab 02/21/20 0418 02/22/20 0432 02/23/20 0408 02/24/20 0233 02/26/20 0112  WBC 13.5* 10.3 11.2* 11.7* 16.5*  NEUTROABS  --   --   --   --  16.2*  HGB 16.8 16.2 16.0 15.9 17.6*  HCT 53.7* 52.4* 50.8 52.1* 56.6*  MCV 87.7 87.5 87.0 89.1 88.7  PLT 192 191 197 186 237    Basic Metabolic Panel: Recent Labs  Lab 02/21/20 0418 02/22/20 0432 02/23/20 0408 02/24/20 0233 02/26/20 0112  NA 144 139 142 141 143  K 3.4* 3.0* 2.4* 3.1* 4.5  CL 91* 89* 91* 93* 102  CO2 41* 41* 41* 38* 32  GLUCOSE 80 103* 95 98 125*  BUN _0 CREATININE 0.53* 0.55* 0.63 0.73 0.78  CALCIUM 9.7 9.6 9.6 9.5 10.9*  MG  --  1.8 1.9  --  1.9  PHOS  --   --   --   --  2.4*   GFR: Estimated Creatinine Clearance: 106.9 mL/min (by C-G formula based on SCr of 0.78 mg/dL). Recent Labs  Lab 02/22/20 0432 02/23/20 0408 02/24/20 0233 02/26/20 0112  WBC 10.3 11.2*  11.7* 16.5*  LATICACIDVEN  --   --  0.9  --     Liver Function Tests: Recent Labs  Lab 02/21/20 0418 02/23/20 0408 02/24/20 0233 02/26/20 0112  AST 82* 48* 42* 32  ALT 50* 47* 42 38  ALKPHOS 78 66 62 70  BILITOT 2.4* 2.3* 2.2* 2.1*  PROT 6.1* 5.8* 5.7* 6.3*  ALBUMIN 3.1* 3.0* 2.8* 3.0*   No results for input(s): LIPASE, AMYLASE in the last 168 hours. Recent Labs  Lab 02/24/20 0233  AMMONIA 53*    ABG    Component Value Date/Time   PHART 7.378 02/24/2020 0933   PCO2ART 77.6 (HH) 02/24/2020 0933   PO2ART 74.3 (L) 02/24/2020 0933   HCO3 38.5 (H) 02/24/2020 0933   O2SAT 93.0 02/24/2020 0933     Coagulation Profile: No results for input(s): INR, PROTIME in the last 168 hours.  Cardiac Enzymes: No results for input(s): CKTOTAL, CKMB, CKMBINDEX, TROPONINI in the last 168 hours.  HbA1C: Hgb A1c MFr Bld  Date/Time Value Ref Range Status  02/15/2020 01:42 AM 6.9 (H) 4.8 - 5.6 % Final    Comment:    (NOTE) Pre diabetes:          5.7%-6.4%  Diabetes:              >6.4%  Glycemic control for   <7.0% adults with diabetes   06/01/2019 07:34 AM 6.2 (H) 4.8 - 5.6 % Final    Comment:    (NOTE) Pre diabetes:          5.7%-6.4% Diabetes:              >6.4% Glycemic control for   <7.0% adults with diabetes     CBG: Recent Labs  Lab 02/25/20 2110 02/26/20 0623 02/26/20 1242 02/26/20 1702 02/26/20 2327  GLUCAP 106* 131* 113* 107* 123*     Collier Bullock  MD CCM

## 2020-02-27 NOTE — Progress Notes (Signed)
Progress note  Notified by RN that patient is having a seizure for over 30 minutes.  He was evaluated at bedside along with neurology.  Noted to have jerking movements of his left upper extremity and twitching of his head to the left.  No movements of the right upper and lower extremities.  Patient unresponsive.  Gag reflex weak.  On BiPAP for hypoxic and hypercapnic respiratory failure.    -Patient was given Ativan 1 mg with no improvement.  Additional Ativan 1 mg ordered. Neurology recommending starting fosphenytoin for convulsive status epilepticus after patient is transferred to the ICU. PCCM has been consulted.  Given patient's impaired level of consciousness and weak gag reflex, BiPAP has been removed and patient placed on 8 L via Venturi mask, currently satting in the mid 90s.

## 2020-02-27 NOTE — Progress Notes (Signed)
Patient with recurrence of left sided twitching. Versed was discontinued earlier. Currently on scheduled Keppra and Vimpat. Event button pushed. EEG cannot be pulled up currently on the [employer]. Given clinical suspicion for seizure recurrence overnight per sign out with Dr. Lorrin Goodell, will load with fosphenytoin 15 mg PE/kg and start Dilantin 100 mg TID in 8 hours. Will discuss with Dr. Hortense Ramal as well.   Electronically signed: Dr. Kerney Elbe

## 2020-02-27 NOTE — Progress Notes (Signed)
Left arm twitching has recurred. EEG now with faster PLEDs. Overall findings most consistent with focal motor status epilepticus.   Dilantin level has been ordered.   Starting Versed gtt towards goal of of burst suppression on EEG. Bolusing 10 mg followed by initial infusion rate of 10 mg/hr.   Continue Keppra, Vimpat and Dilantin.   Discussed with Dr. Hortense Ramal.   Electronically signed: Dr. Kerney Elbe

## 2020-02-27 NOTE — Progress Notes (Signed)
[place] Progress Note Patient Name: Antonio Ramirez DOB: 1961/01/13 MRN: [id number]   Date of Service  02/27/2020  HPI/Events of Note  Hyperglycemia - Patient reintubated today and remains on AC/HS very sensitive Novolog SSI.  eICU Interventions  Will change to Q 4 hour very sensitive Novolog SSI.     Intervention Category Major Interventions: Hyperglycemia - active titration of insulin therapy  Lysle Dingwall 02/27/2020, 7:58 PM

## 2020-02-27 NOTE — Progress Notes (Signed)
Brief Neuro Update:  Patient transferred to ICU and now intubated. I briefly layed eye on him. He is on Versed but was also given rocuronium. I saw him about 30 mins after intubation. He is no longer having Left arm twictching movements.  At this time, okay to hold off on fosphenytoin given no clinical signs of seizure activity. Tech is here to hook him up on cEEG. If cEEG shows seizure activity, will continue with fosphenytoin load.  I have discontinued fosphenytoin load and fosphenytoin levels.  STAT CTH is still pending but is scheduled.  [place] Pager Number [phone]

## 2020-02-27 NOTE — Significant Event (Signed)
Rapid Response Event Note   Asked for help with transfer to ICU d/t seizures. Pt transferred to 4N25 with 2 3W RNs, RRT, and MD.  Pt immediately intubated upon arrival.   Dillard Essex, RN

## 2020-02-27 NOTE — TOC Progression Note (Signed)
Transition of Care [facility]) - Progression Note    Patient Details  Name: Antonio Ramirez MRN: [id number] Date of Birth: 03/17/1961  Transition of Care [facility]) CM/SW Contact  Joanne Chars, LCSW Phone Number: 02/27/2020, 9:51 AM  Clinical Narrative:  Phone call from [facility] [place] stating they are cancelling the insurance auth due to pt not being medically ready for DC.     Expected Discharge Plan: [place] Barriers to Discharge: Continued Medical Work up  Expected Discharge Plan and Services Expected Discharge Plan: [place] In-house Referral: Clinical Social Work   Post Acute Care Choice: [place] Living arrangements for the past 2 months: [place] Determinants of Health (SDOH) Interventions    Readmission Risk Interventions Readmission Risk Prevention Plan 02/21/2020 02/16/2020  Transportation Screening Complete Complete  Social Work Consult for [place] Planning/Counseling Complete -  [place] Screening Not Applicable Not Applicable  Medication Review [employer]) Complete Complete  Some recent data might be hidden

## 2020-02-27 NOTE — Procedures (Signed)
Patient Name: Antonio Ramirez  MRN: [id number]  Epilepsy Attending: Lora Havens  Referring Physician/Provider: Dr Donnetta Simpers Date: 02/27/2020  Duration: 24.24 mins  Patient history: 59yo M with left sided twitching.  EEG to evaluate for seizure.  Level of alertness:  comatose  AEDs during EEG study: Keppra, phenytoin, lacosamide  Technical aspects: This EEG study was done with scalp electrodes positioned according to the 10-20 International system of electrode placement. Electrical activity was acquired at a sampling rate of 500Hz  and reviewed with a high frequency filter of 70Hz  and a low frequency filter of 1Hz . EEG data were recorded continuously and digitally stored.   Description: EEG showed continuous generalized 3 to 5 Hz theta delta slowing with overriding 15 to 18 Hz generalized beta activity.  Sharp waves were seen in right frontocentral region at times periodic at 0.25 to  0.5 Hz.  Hyperventilation and photic stimulation were not performed.     ABNORMALITY -Sharp waves, right frontocentral region -Continuous slow, generalized -Excessive beta, generalized  IMPRESSION: This study showed evidence of epileptogenicity arising from right frontocentral region, which is at times periodic and suggestive of underlying structural abnormality with high likelihood of seizures.  Additionally, there is evidence of severe diffuse encephalopathy, nonspecific etiology but could be secondary to sedation, seizures.  No seizures were seen throughout the recording.   Antonio Ramirez Antonio Ramirez

## 2020-02-27 NOTE — Progress Notes (Signed)
Provider notified, Neurology at bedside, ordered transfer to ICU 4N 25. Patient transferred with nurse. Family was notified and given new room location.

## 2020-02-27 NOTE — Progress Notes (Signed)
OT Cancellation Note  Patient Details Name: QUINTAVIS BRANDS MRN: [id number] DOB: 05-Oct-1960   Cancelled Treatment:    Reason Eval/Treat Not Completed: Medical issues which prohibited therapy; pt with transfer to ICU and intubated overnight. Currently not medically appropriate to participate in therapies. Will follow up as able.  Lou Cal, OT Acute Rehabilitation Services Pager [phone] Office [phone]   Raymondo Band 02/27/2020, 1:47 PM

## 2020-02-27 NOTE — Progress Notes (Signed)
Nutrition Follow-up  DOCUMENTATION CODES:   Not applicable  INTERVENTION:   Initiate tube feeding via OG tube: Osmolite 1.5 at 20 ml/h and increase by 10 ml every 8 hours to goal rate of 50 (1200 ml per day) Prosource TF 45 ml TID  Provides 1920 kcal, 108 gm protein, 914 ml free water daily   NUTRITION DIAGNOSIS:   Inadequate oral intake related to inability to eat as evidenced by NPO status. Ongoing.   GOAL:   Patient will meet greater than or equal to 90% of their needs  Progressing.   MONITOR:   Labs, I & O's, Vent status, Diet advancement, Weight trends  REASON FOR ASSESSMENT:   Ventilator    ASSESSMENT:   59 year old male with history significant for chronic back pain, opiate dependence, COPD, CAD s/p bypass surgery presented with AMS after presenting the day prior with same then discharged home after AMS resolved and at baseline. Pt returned with decline in level of consciousness, work-up with significant respiratory acidosis, O2 sats 90% on 4L and placed on 15 L NRB. UDS positive for opiates and cannabinoids s/p  Narcan without improvement and required intubation for acute hypoxic and hypercarbic respiratory failure.  Pt discussed during ICU rounds and with RN.   11/2-11/6 intubated 11/10 not following commands 11/12 persistent seizures  11/13 transferred to [facility] for continuous EEG 11/15 developed status epilepticus; intubated for airway protection   Patient is currently intubated on ventilator support MV: 9.5 L/min Temp (24hrs), Avg:98.4 F (36.9 C), Min:97.6 F (36.4 C), Max:99 F (37.2 C)  Weight at [facility] around 180-190 lb; after transfer to [facility] pt with 170 lb with moderate edema    Diet Order:   Diet Order            Diet NPO time specified  Diet effective now                 EDUCATION NEEDS:   No education needs have been identified at this time  Skin:  Skin Assessment: Reviewed RN Assessment  Last BM:  11/8  Height:   Ht Readings  from Last 1 Encounters:  02/27/20 6' (1.829 m)    Weight:   Wt Readings from Last 1 Encounters:  02/27/20 77 kg    Ideal Body Weight:     BMI:  Body mass index is 23.02 kg/m.  Estimated Nutritional Needs:   Kcal:  1900  Protein:  100-115 grams  Fluid:  > 2 L/day  Lockie Pares., RD, LDN, CNSC See AMiON for contact information

## 2020-02-27 NOTE — Progress Notes (Signed)
NAME:  Antonio Ramirez, MRN:  [id number], DOB:  1960/08/05, LOS: 68 ADMISSION DATE:  02/14/2020, CONSULTATION DATE:  02/27/20 REFERRING MD: Marlowe Sax CHIEF COMPLAINT:  Acute encephalopathy, seizures, status epilepticus  Brief History   59 year old man with COPD, hx of Drug abuse, admitted with hypercarbic respiratory failure, intubated in the ER11/2, not responsive to narcan.     History of present illness    59 y.o. male with medical history significant for opiate dependence, COPD, carotid artery disease, depression.  Initially admitted with AMS on 11/2.  No respiratory symptoms, but hypoxic to 70s at the time of admission on RA.  No known abuse of medications.  Took oxycodone per schedule pta.   Received 2 doses of Moderna Vaccine- April/May. He was intubated in the ED for acute hypoxic and hypercarbic respiratory failure. Extubated 11/5.  Remained sleepy 11/6-11/7, intermittent agitation 11/8 Increasingly alert 11/9 after bipap.  11/10 per cards note not following commands, jerking on R side.  Started on Keppra bolus and then 500 mg TID.  11/10 non responsive per neuro note. 11/11 still lethargic.  11/12persistent seizures, sent to [facility] for cont EEG.  11/13 transferred from [facility], needed increased O2 (16L NRB).  Withdrawing to stimuti. Persistent seizures, babbling incoherently.  11/14 am - able to say name, but + dysarthria. Awake but incoherent, not following commands, withdrawing to stimuli.  Completed 24 hr EEG. "IMPRESSION: This study is suggestive of mild to moderate diffuse encephalopathy, nonspecific etiology.No seizures or epileptiform discharges were seen throughout the recording." Antiplt agents held.   11/15: developed status epilepticus, no improvement with ativan 22m x 3 (?), completely obtunded and non responsive between bouts of seizure activity. Non responsive to any stimuli on my exam.  Intubated for airway protection.   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 No evidence of interval hemorrhage. No abnormal mass effect or midline shift. Remote right parietotemporal infarct again noted.   Micro Data:  Urine 11/2  > neg  BC x 2   11/2 >>> BC x 2  11/4 >>> MRSA PCR  11/3  Neg    Antimicrobials:  maxepime  11/2 x1 Flagyl  11/2 -11/3 Vanc  11/2x1 Ancef 11/3 >>>11/7   Scheduled Meds:  chlorhexidine gluconate (MEDLINE KIT)  15 mL Mouth Rinse BID   Chlorhexidine Gluconate Cloth  6 each Topical Daily   influenza vac split quadrivalent PF  0.5 mL Intramuscular Tomorrow-1000   insulin aspart  0-5 Units Subcutaneous QHS   insulin aspart  0-6 Units Subcutaneous TID WC   lactulose  300 mL Rectal BID   mouth rinse  15 mL Mouth Rinse 10 times per day   methylPREDNISolone (SOLU-MEDROL) injection  60 mg Intravenous Q24H   metoprolol tartrate  5 mg Intravenous Q6H   phenytoin (  DILANTIN) IV  100 mg Intravenous Q8H   pneumococcal 23 valent vaccine  0.5 mL Intramuscular Tomorrow-1000   Continuous Infusions:  sodium chloride 10 mL/hr at 02/27/20 0800   sodium chloride     dextrose 5 % and 0.45% NaCl Stopped (02/26/20 1027)   famotidine (PEPCID) IV 20 mg (02/26/20 2326)   lacosamide (VIMPAT) IV Stopped (02/27/20 0003)   levETIRAcetam 1,000 mg (02/27/20 0802)   norepinephrine  (LEVOPHED) Adult infusion Stopped (02/27/20 0323)    Interim history/subjective:  Intubated for airway protection this morning. Found to have left arm twitching with right central EEG correlate. Loaded with phenytoin.   Objective   Blood pressure (!) 97/59, pulse 74, temperature 97.6 F (36.4 C), temperature source Axillary, resp. rate 11, height 6' (1.829 m), weight 77 kg, SpO2 97 %.    Vent Mode: PRVC FiO2 (%):  [40 %-60 %] 60 % Set Rate:  [16 bmp] 16 bmp Vt Set:  [620 mL] 620 mL PEEP:  [5 cmH20] 5 cmH20 Plateau Pressure:  [27 cmH20] 27 cmH20   Intake/Output Summary (Last 24 hours) at 02/27/2020 0931 Last data filed at 02/27/2020 0800 Gross per 24 hour  Intake 1460.31 ml  Output 700 ml  Net 760.31 ml   Filed Weights   02/19/20 0500 02/26/20 1229 02/27/20 0500  Weight: 82.5 kg 77 kg 77 kg    Examination: Gen: intubated on fentanyl for sedation.  HEENT: ETT and OGT in place. Poor dentition.  Lungs: no ventilator dyssynchrony.  Vesicular breath sounds throughout with no wheezing.  + tracheal tug on examination.  Cardiac RRR. HS are distant. Prior sternotomy scar. Abd NT, ND, NBS Extr warm with no edema or clubbing noted Neuro: PERL, will track to right but will not cross midline to the left. No eye opening to voice or pain. Extensor response with high frequency tremor of upper limbs.  Triple flexion in LE. L>R   cEEG personally reviewed and no evidence of seizures at the time of my review.   Resolved Hospital Problem list    hypotension AKI  Assessment & Plan:   Critically ill due to suspected status epilepticus.  Remains encephalopathic dye to post-ictal/interictal state - Continue AED regimen Vimpat, Keppra and Dilantin.  - Hold on continuous sedation for now and allow patient to wake up.   Critically ill due to acute acute hypoxemic hypercarbic respiratory failure:  - Continue full ventilatory support and wean based on neurological status.  Acute exacerbation  of severe COPD. Improving with steroids and prolonged expiration no longer present. -On Methylprednisolone 60 daily.   - Duoneb  Cor pulmonale by Echo this admit with neg perfusion scan ruling out CTEPH.  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  svt afib paroxysmal.   - resume beta-blocker for rate control once BP allows.   Best practice:  Diet: NPO for now, tube feeds in AM Pain/Anxiety/Delirium protocol (if indicated): fentanyl only.  VAP protocol (if indicated): bundle in place.  DVT prophylaxis:  lovenox GI prophylaxis: pepcid Glucose control: ISS prn Mobility: bedrest Code Status: Full Code  Family Communication: will update.  Disposition: ICU  Labs   CBC: Recent Labs  Lab 02/21/20 0418 02/21/20 0418 02/22/20 0432 02/23/20 0408 02/24/20 0233 02/26/20 0112 02/27/20 0406  WBC 13.5*  --  10.3 11.2* 11.7* 16.5*  --   NEUTROABS  --   --   --   --   --  16.2*  --   HGB 16.8   < > 16.2 16.0 15.9 17.6* 18.0*  HCT 53.7*   < > 52.4* 50.8 52.1* 56.6* 53.0*  MCV 87.7  --  87.5 87.0 89.1 88.7  --   PLT 192  --  191 197 186 172  --    < > = values in this interval not displayed.    Basic Metabolic Panel: Recent Labs  Lab 02/21/20 0418 02/21/20 0418 02/22/20 0432 02/23/20 0408 02/24/20 0233 02/26/20 0112 02/27/20 0406  NA 144   < > 139 142 141 143 145  K 3.4*   < > 3.0* 2.4* 3.1* 4.5 3.3*  CL 91*  --  89* 91* 93* 102  --   CO2 41*  --  41* 41* 38* 32  --   GLUCOSE 80  --  103* 95 98 125*  --   BUN 14  --  '15 16 17 17  ' --   CREATININE 0.53*  --  0.55* 0.63 0.73 0.78  --   CALCIUM 9.7  --  9.6 9.6 9.5 10.9*  --   MG  --   --  1.8 1.9  --  1.9  --   PHOS  --   --   --   --   --  2.4*  --    < > = values in this interval not displayed.   GFR: Estimated Creatinine Clearance: 106.9 mL/min (by C-G formula based on SCr of 0.78 mg/dL). Recent Labs  Lab 02/22/20 0432 02/23/20 0408 02/24/20 0233  02/26/20 0112  WBC 10.3 11.2* 11.7* 16.5*  LATICACIDVEN  --   --  0.9  --     Liver Function Tests: Recent Labs  Lab 02/21/20 0418 02/23/20 0408 02/24/20 0233 02/26/20 0112  AST 82* 48* 42* 32  ALT 50* 47* 42 38  ALKPHOS 78 66 62 70  BILITOT 2.4* 2.3* 2.2* 2.1*  PROT 6.1* 5.8* 5.7* 6.3*  ALBUMIN 3.1* 3.0* 2.8* 3.0*   No results for input(s): LIPASE, AMYLASE in the last 168 hours. Recent Labs  Lab 02/24/20 0233  AMMONIA 53*    ABG    Component Value Date/Time   PHART 7.483 (H) 02/27/2020 0406   PCO2ART 49.7 (H) 02/27/2020 0406   PO2ART 64 (L) 02/27/2020 0406   HCO3 37.4 (H) 02/27/2020 0406   TCO2 39 (H) 02/27/2020 0406   O2SAT 93.0 02/27/2020 0406     Coagulation Profile: No results for input(s): INR, PROTIME in the last 168 hours.  Cardiac Enzymes: No results for input(s): CKTOTAL, CKMB, CKMBINDEX, TROPONINI in the last 168 hours.  HbA1C: Hgb A1c MFr Bld  Date/Time Value Ref Range Status  02/15/2020 01:42 AM 6.9 (H) 4.8 - 5.6 % Final    Comment:    (NOTE) Pre diabetes:          5.7%-6.4%  Diabetes:              >6.4%  Glycemic control for   <7.0% adults with diabetes   06/01/2019 07:34 AM 6.2 (H) 4.8 - 5.6 % Final    Comment:    (NOTE) Pre diabetes:          5.7%-6.4% Diabetes:              >6.4% Glycemic control for   <7.0% adults with diabetes     CBG: Recent Labs  Lab 02/26/20 0623 02/26/20 1242 02/26/20 1702 02/26/20 2327 02/27/20 0802  GLUCAP 131* 113* 107* 123* 116*  CRITICAL CARE Performed by: Kipp Brood   Total critical care time: 40 minutes  Critical care time was exclusive of separately billable procedures and treating other patients.  Critical care was necessary to treat or prevent imminent or life-threatening deterioration.  Critical care was time spent personally by me on the following activities: development of treatment plan with patient and/or surrogate as well as nursing, discussions with consultants,  evaluation of patient's response to treatment, examination of patient, obtaining history from patient or surrogate, ordering and performing treatments and interventions, ordering and review of laboratory studies, ordering and review of radiographic studies, pulse oximetry, re-evaluation of patient's condition and participation in multidisciplinary rounds.  Kipp Brood, MD [facility] ICU Physician [place]  Pager: [phone] Mobile: [phone] After hours: [phone].

## 2020-02-27 NOTE — Progress Notes (Signed)
Brief Neuro Update:  I was notified by the primary tem that patient is unresponsive and has had L sided twitching for atleast 30 mins. Patient was given 1mg  of Ativan prior to my arrival. He has also been on max dose of Keppra 1000mg  TID and Vimpat 200mg  BID. He has known R frontal pole and superior R parietal convexity SAH and old R parietotemporal infarct.  On my arrival, he had rhythmic twitch involving the entire LUE from shoulder to hand. He was not opening eye to command, had facial grimace to nares stimulation but did not attempt to localize with any extremities. He had positive corneals, only a weak gag could be elicited with a Q-tip.  Symptoms are concerning for potential focal status given that the twitching has been going on for the last 30 mins. I ordered another 1mg  of Ativan and fosphenytoin 15mg /Kg loading dose.  I did discuss with the hospitalist that airway is definitely a concern given his poor wakefulness and weak gag. He may completely lose his ability to protect his airway with fospheytoin load. We discussed that it might be safer to do that once he is in the ICU to ensure that we can closely monitor his airway and respond immediately to loss of airway.  I also ordered cEEG and notified the tech on call. The tech will be coming in for this hookup so this may take some time.  - I ordered additional Ativan 1mg  - I ordered fosphenytoin load of 16meq/Kg - I ordered phenytoin levels to be collected about 30 mins after the loading dose - Tech on call notified about STAT cEEG order.  [place] Pager Number [phone]

## 2020-02-27 NOTE — Progress Notes (Signed)
PT Cancellation Note  Patient Details Name: Antonio Ramirez MRN: [id number] DOB: [birth year]   Cancelled Treatment:    Reason Eval/Treat Not Completed: Patient not medically ready - RR overnight for prolonged seizure activity, ETT early this am, RN confirms pt not medically ready. PT to check back at a later time.   [place] Pager [phone]  Office [phone]     Julien Girt 02/27/2020, 1:27 PM

## 2020-02-28 DIAGNOSIS — Z515 Encounter for palliative care: Secondary | ICD-10-CM

## 2020-02-28 DIAGNOSIS — Z978 Presence of other specified devices: Secondary | ICD-10-CM | POA: Diagnosis not present

## 2020-02-28 DIAGNOSIS — J9601 Acute respiratory failure with hypoxia: Secondary | ICD-10-CM | POA: Diagnosis not present

## 2020-02-28 DIAGNOSIS — Z66 Do not resuscitate: Secondary | ICD-10-CM

## 2020-02-28 DIAGNOSIS — R4182 Altered mental status, unspecified: Secondary | ICD-10-CM | POA: Diagnosis not present

## 2020-02-28 DIAGNOSIS — R569 Unspecified convulsions: Secondary | ICD-10-CM | POA: Diagnosis not present

## 2020-02-28 LAB — CBC WITH DIFFERENTIAL/PLATELET
Abs Immature Granulocytes: 0.04 10*3/uL (ref 0.00–0.07)
Basophils Absolute: 0 10*3/uL (ref 0.0–0.1)
Basophils Relative: 0 %
Eosinophils Absolute: 0 10*3/uL (ref 0.0–0.5)
Eosinophils Relative: 0 %
HCT: 57.3 % — ABNORMAL HIGH (ref 39.0–52.0)
Hemoglobin: 17.9 g/dL — ABNORMAL HIGH (ref 13.0–17.0)
Immature Granulocytes: 0 %
Lymphocytes Relative: 4 %
Lymphs Abs: 0.5 10*3/uL — ABNORMAL LOW (ref 0.7–4.0)
MCH: 27 pg (ref 26.0–34.0)
MCHC: 31.2 g/dL (ref 30.0–36.0)
MCV: 86.3 fL (ref 80.0–100.0)
Monocytes Absolute: 0.6 10*3/uL (ref 0.1–1.0)
Monocytes Relative: 6 %
Neutro Abs: 9.7 10*3/uL — ABNORMAL HIGH (ref 1.7–7.7)
Neutrophils Relative %: 90 %
Platelets: 187 10*3/uL (ref 150–400)
RBC: 6.64 MIL/uL — ABNORMAL HIGH (ref 4.22–5.81)
RDW: 21.3 % — ABNORMAL HIGH (ref 11.5–15.5)
WBC: 10.9 10*3/uL — ABNORMAL HIGH (ref 4.0–10.5)
nRBC: 0 % (ref 0.0–0.2)

## 2020-02-28 LAB — GLUCOSE, CAPILLARY
Glucose-Capillary: 154 mg/dL — ABNORMAL HIGH (ref 70–99)
Glucose-Capillary: 176 mg/dL — ABNORMAL HIGH (ref 70–99)
Glucose-Capillary: 147 mg/dL — ABNORMAL HIGH (ref 70–99)
Glucose-Capillary: 122 mg/dL — ABNORMAL HIGH (ref 70–99)
Glucose-Capillary: 150 mg/dL — ABNORMAL HIGH (ref 70–99)
Glucose-Capillary: 162 mg/dL — ABNORMAL HIGH (ref 70–99)

## 2020-02-28 LAB — COMPREHENSIVE METABOLIC PANEL
ALT: 35 U/L (ref 0–44)
AST: 25 U/L (ref 15–41)
Albumin: 2.8 g/dL — ABNORMAL LOW (ref 3.5–5.0)
Alkaline Phosphatase: 65 U/L (ref 38–126)
Anion gap: 8 (ref 5–15)
BUN: 27 mg/dL — ABNORMAL HIGH (ref 6–20)
CO2: 33 mmol/L — ABNORMAL HIGH (ref 22–32)
Calcium: 11.2 mg/dL — ABNORMAL HIGH (ref 8.9–10.3)
Chloride: 106 mmol/L (ref 98–111)
Creatinine, Ser: 0.75 mg/dL (ref 0.61–1.24)
GFR, Estimated: >60 mL/min (ref >60)
Glucose, Bld: 220 mg/dL — ABNORMAL HIGH (ref 70–99)
Potassium: 3.8 mmol/L (ref 3.5–5.1)
Sodium: 147 mmol/L — ABNORMAL HIGH (ref 135–145)
Total Bilirubin: 1.9 mg/dL — ABNORMAL HIGH (ref 0.3–1.2)
Total Protein: 6 g/dL — ABNORMAL LOW (ref 6.5–8.1)

## 2020-02-28 LAB — PHOSPHORUS
Phosphorus: 2.6 mg/dL (ref 2.5–4.6)
Phosphorus: 2.4 mg/dL — ABNORMAL LOW (ref 2.5–4.6)

## 2020-02-28 LAB — MAGNESIUM
Magnesium: 2 mg/dL (ref 1.7–2.4)
Magnesium: 1.9 mg/dL (ref 1.7–2.4)

## 2020-02-28 MED ORDER — FAMOTIDINE 40 MG/5ML PO SUSR
20.0000 mg | Freq: Two times a day (BID) | ORAL | Status: DC
  Administered 2020-02-28 – 2020-03-06 (×15): 20 mg
  Filled 2020-02-28 (×16): qty 2.5

## 2020-02-28 NOTE — Progress Notes (Signed)
PT Cancellation Note  Patient Details Name: Antonio Ramirez MRN: [id number] DOB: [birth year]   Cancelled Treatment:    Reason Eval/Treat Not Completed: Patient not medically ready per RN, hold PT treatment for today as pt not medically ready. Continues to be on continuous EEG. Will follow.   Marguarite Arbour A [place] Riesen 02/28/2020, 8:01 AM Marisa Severin, PT, DPT Acute Rehabilitation Services Pager [phone] Office [phone]

## 2020-02-28 NOTE — Progress Notes (Signed)
Called by RN regarding patient being more awake and with some extensor posturing right upper extremity which is more prominent than before.  No focal seizure activity clinically. Continue current dose of Versed Continue LTM EEG-preliminary quick review with no evidence of seizures.  Slowing noted. Formal read pending in the morning. Neurology will continue to follow.  -- Amie Portland, MD [facility]

## 2020-02-28 NOTE — Progress Notes (Signed)
EEG maint complete. No skin breakdown at [place] FP2 A2 F8. Continue to monitor

## 2020-02-28 NOTE — Progress Notes (Signed)
OT Cancellation Note  Patient Details Name: Antonio Ramirez MRN: [id number] DOB: 31-Oct-1960   Cancelled Treatment:    Reason Eval/Treat Not Completed: Medical issues which prohibited therapy; remains intubated, per RN not medically appropriate to work with therapies. Will follow.  Lou Cal, OT Acute Rehabilitation Services Pager [phone] Office [phone]   Raymondo Band 02/28/2020, 8:06 AM

## 2020-02-28 NOTE — Progress Notes (Addendum)
Subjective: Dr. Rory Percy was called by RN regarding patient being more awake and with some extensor posturing right upper extremity which was more prominent than before.  No focal seizure activity was seen clinically. Preliminary review of LTM EEG by Dr. Rory Percy with no evidence of seizures, but with slowing noted.  Objective: Current vital signs: BP 90/70   Pulse 68   Temp (!) 97.5 F (36.4 C) (Axillary)   Resp 18   Ht 6' (1.829 m)   Wt 77 kg   SpO2 95%   BMI 23.02 kg/m  Vital signs in last 24 hours: Temp:  [97.5 F (36.4 C)-99 F (37.2 C)] 97.5 F (36.4 C) (11/16 0322) Pulse Rate:  [49-84] 68 (11/16 0700) Resp:  [11-18] 18 (11/16 0700) BP: (76-165)/(45-92) 90/70 (11/16 0700) SpO2:  [94 %-100 %] 95 % (11/16 0700) FiO2 (%):  [40 %-60 %] 40 % (11/16 0400)  Intake/Output from previous day: 11/15 0701 - 11/16 0700 In: 1306.3 [I.V.:667.4; NG/GT:40; IV Piggyback:598.9] Out: 675 [Urine:675] Intake/Output this shift: No intake/output data recorded. Nutritional status:  Diet Order            Diet NPO time specified  Diet effective now                General Exam: Gen: Intubated. Ext: No rash noted.   Neurologic Exam: Ment: Eyes closed. Obtunded in the context of Versed gtt at rate of 10 mg/hr.  Less agitation when stimulated, compared to yesterday. No purposeful movements. Does not alert to voice. No attempts to communicate. CN: Pupils equal and reactive 4 mm >> 3 mm. Weak corneal reflexes. Minimal oculocephalic reflex. Face flaccidly symmetric. Spontaneously gags on ET tube.  Motor/Sensory: RUE extensor posturing seen yesterday has resolved. BUE are flexed at elbows but does not appear to be decorticate. LUE with increased flexor tone.  BLE with minimal movement to plantar stimulation.  Reflexes: Brisk throughout. Toes equivocal bilaterally.   Lab Results: Results for orders placed or performed during the hospital encounter of 02/14/20 (from the past 48 hour(s))  Glucose,  capillary     Status: Abnormal   Collection Time: 02/26/20 12:42 PM  Result Value Ref Range   Glucose-Capillary 113 (H) 70 - 99 mg/dL    Comment: Glucose reference range applies only to samples taken after fasting for at least 8 hours.  Culture, blood (Routine X 2) w Reflex to ID Panel     Status: None (Preliminary result)   Collection Time: 02/26/20  4:37 PM   Specimen: BLOOD LEFT HAND  Result Value Ref Range   Specimen Description BLOOD LEFT HAND    Special Requests      BOTTLES DRAWN AEROBIC ONLY Blood Culture results may not be optimal due to an inadequate volume of blood received in culture bottles   Culture      NO GROWTH < 24 HOURS Performed at [facility] [address] [place], [place] [address]    Report Status PENDING   Culture, blood (Routine X 2) w Reflex to ID Panel     Status: None (Preliminary result)   Collection Time: 02/26/20  4:37 PM   Specimen: BLOOD LEFT HAND  Result Value Ref Range   Specimen Description BLOOD LEFT HAND    Special Requests      BOTTLES DRAWN AEROBIC ONLY Blood Culture results may not be optimal due to an inadequate volume of blood received in culture bottles   Culture      NO GROWTH <  24 HOURS Performed at [facility], [place] [address], [place], [place] [address]    Report Status PENDING   Glucose, capillary     Status: Abnormal   Collection Time: 02/26/20  5:02 PM  Result Value Ref Range   Glucose-Capillary 107 (H) 70 - 99 mg/dL    Comment: Glucose reference range applies only to samples taken after fasting for at least 8 hours.  Glucose, capillary     Status: Abnormal   Collection Time: 02/26/20 11:27 PM  Result Value Ref Range   Glucose-Capillary 123 (H) 70 - 99 mg/dL    Comment: Glucose reference range applies only to samples taken after fasting for at least 8 hours.  I-STAT 7, (LYTES, BLD GAS, ICA, H+H)     Status: Abnormal   Collection Time: 02/27/20  4:06 AM  Result Value Ref Range   pH, Arterial 7.483 (H) 7.35 -  7.45   pCO2 arterial 49.7 (H) 32 - 48 mmHg   pO2, Arterial 64 (L) 83 - 108 mmHg   Bicarbonate 37.4 (H) 20.0 - 28.0 mmol/L   TCO2 39 (H) 22 - 32 mmol/L   O2 Saturation 93.0 %   Acid-Base Excess 11.0 (H) 0.0 - 2.0 mmol/L   Sodium 145 135 - 145 mmol/L   Potassium 3.3 (L) 3.5 - 5.1 mmol/L   Calcium, Ion 1.46 (H) 1.15 - 1.40 mmol/L   HCT 53.0 (H) 39 - 52 %   Hemoglobin 18.0 (H) 13.0 - 17.0 g/dL   Patient temperature 98.1 F    Collection site Radial    Drawn by RT    Sample type ARTERIAL   MRSA PCR Screening     Status: None   Collection Time: 02/27/20  6:03 AM   Specimen: Nasal Mucosa; Nasopharyngeal  Result Value Ref Range   MRSA by PCR NEGATIVE NEGATIVE    Comment:        The GeneXpert MRSA Assay (FDA approved for NASAL specimens only), is one component of a comprehensive MRSA colonization surveillance program. It is not intended to diagnose MRSA infection nor to guide or monitor treatment for MRSA infections. Performed at [facility], [place] [address], [place], [place] [address]   Glucose, capillary     Status: Abnormal   Collection Time: 02/27/20  8:02 AM  Result Value Ref Range   Glucose-Capillary 116 (H) 70 - 99 mg/dL    Comment: Glucose reference range applies only to samples taken after fasting for at least 8 hours.  Comprehensive metabolic panel     Status: Abnormal   Collection Time: 02/27/20 10:38 AM  Result Value Ref Range   Sodium 147 (H) 135 - 145 mmol/L   Potassium 3.5 3.5 - 5.1 mmol/L   Chloride 102 98 - 111 mmol/L   CO2 32 22 - 32 mmol/L   Glucose, Bld 129 (H) 70 - 99 mg/dL    Comment: Glucose reference range applies only to samples taken after fasting for at least 8 hours.   BUN 19 6 - 20 mg/dL   Creatinine, Ser 0.93 0.61 - 1.24 mg/dL   Calcium 10.9 (H) 8.9 - 10.3 mg/dL   Total Protein 6.1 (L) 6.5 - 8.1 g/dL   Albumin 2.9 (L) 3.5 - 5.0 g/dL   AST 31 15 - 41 U/L   ALT 37 0 - 44 U/L   Alkaline Phosphatase 68 38 - 126 U/L   Total Bilirubin 2.3 (H)  0.3 - 1.2 mg/dL   GFR, Estimated >60 >  60 mL/min    Comment: (NOTE) Calculated using the CKD-EPI Creatinine Equation (2021)    Anion gap 13 5 - 15    Comment: Performed at [facility], [place] [address], [place], [place] [address]  Magnesium     Status: None   Collection Time: 02/27/20 10:38 AM  Result Value Ref Range   Magnesium 1.8 1.7 - 2.4 mg/dL    Comment: Performed at [facility] [address] [place], [place] [address]  Phosphorus     Status: None   Collection Time: 02/27/20 10:38 AM  Result Value Ref Range   Phosphorus 2.7 2.5 - 4.6 mg/dL    Comment: Performed at [facility] [address] [place], [place] [address]  CBC with Differential/Platelet     Status: Abnormal   Collection Time: 02/27/20 10:38 AM  Result Value Ref Range   WBC 14.3 (H) 4.0 - 10.5 K/uL   RBC 6.12 (H) 4.22 - 5.81 MIL/uL   Hemoglobin 16.5 13.0 - 17.0 g/dL   HCT 52.7 (H) 39 - 52 %   MCV 86.1 80.0 - 100.0 fL   MCH 27.0 26.0 - 34.0 pg   MCHC 31.3 30.0 - 36.0 g/dL   RDW 20.8 (H) 11.5 - 15.5 %   Platelets 197 150 - 400 K/uL    Comment: REPEATED TO VERIFY   nRBC 0.0 0.0 - 0.2 %   Neutrophils Relative % 88 %   Neutro Abs 12.5 (H) 1.7 - 7.7 K/uL   Lymphocytes Relative 4 %   Lymphs Abs 0.5 (L) 0.7 - 4.0 K/uL   Monocytes Relative 8 %   Monocytes Absolute 1.2 (H) 0.1 - 1.0 K/uL   Eosinophils Relative 0 %   Eosinophils Absolute 0.0 0.0 - 0.5 K/uL   Basophils Relative 0 %   Basophils Absolute 0.0 0.0 - 0.1 K/uL   Immature Granulocytes 0 %   Abs Immature Granulocytes 0.06 0.00 - 0.07 K/uL    Comment: Performed at [facility], [place] [address] [place], [place] [address]  Lipid panel     Status: Abnormal   Collection Time: 02/27/20 10:38 AM  Result Value Ref Range   Cholesterol 118 0 - 200 mg/dL   Triglycerides 110 <150 mg/dL   HDL 39 (L) >40 mg/dL   Total CHOL/HDL Ratio 3.0 RATIO   VLDL 22 0 - 40 mg/dL   LDL Cholesterol 57 0 - 99 mg/dL    Comment:        Total  Cholesterol/HDL:CHD Risk Coronary Heart Disease Risk Table                     Men   Women  1/2 Average Risk   3.4   3.3  Average Risk       5.0   4.4  2 X Average Risk   9.6   7.1  3 X Average Risk  23.4   11.0        Use the calculated Patient Ratio above and the CHD Risk Table to determine the patient's CHD Risk.        ATP III CLASSIFICATION (LDL):  <100     mg/dL   Optimal  100-129  mg/dL   Near or Above                    Optimal  130-159  mg/dL   Borderline  160-189  mg/dL   High  >190  mg/dL   Very High Performed at [facility], [place] [address], [place], [place] [address]   Glucose, capillary     Status: Abnormal   Collection Time: 02/27/20 11:35 AM  Result Value Ref Range   Glucose-Capillary 116 (H) 70 - 99 mg/dL    Comment: Glucose reference range applies only to samples taken after fasting for at least 8 hours.  Glucose, capillary     Status: Abnormal   Collection Time: 02/27/20  4:07 PM  Result Value Ref Range   Glucose-Capillary 127 (H) 70 - 99 mg/dL    Comment: Glucose reference range applies only to samples taken after fasting for at least 8 hours.  Magnesium     Status: None   Collection Time: 02/27/20  6:02 PM  Result Value Ref Range   Magnesium 1.7 1.7 - 2.4 mg/dL    Comment: Performed at [facility], [place] [address], [place], [place] [address]  Phosphorus     Status: Abnormal   Collection Time: 02/27/20  6:02 PM  Result Value Ref Range   Phosphorus 1.8 (L) 2.5 - 4.6 mg/dL    Comment: Performed at [facility] [address], [place], [place] [address]  Phenytoin level, total     Status: None   Collection Time: 02/27/20  6:02 PM  Result Value Ref Range   Phenytoin Lvl 17.8 10.0 - 20.0 ug/mL    Comment: Performed at [facility] [address] [place], [place] [address]  Glucose, capillary     Status: Abnormal   Collection Time: 02/27/20  7:23 PM  Result Value Ref Range   Glucose-Capillary 180 (H) 70 - 99 mg/dL     Comment: Glucose reference range applies only to samples taken after fasting for at least 8 hours.   Comment 1 Notify RN    Comment 2 Document in Chart   Glucose, capillary     Status: Abnormal   Collection Time: 02/27/20 11:18 PM  Result Value Ref Range   Glucose-Capillary 166 (H) 70 - 99 mg/dL    Comment: Glucose reference range applies only to samples taken after fasting for at least 8 hours.   Comment 1 Notify RN    Comment 2 Document in Chart   Glucose, capillary     Status: Abnormal   Collection Time: 02/28/20  3:14 AM  Result Value Ref Range   Glucose-Capillary 154 (H) 70 - 99 mg/dL    Comment: Glucose reference range applies only to samples taken after fasting for at least 8 hours.   Comment 1 Notify RN    Comment 2 Document in Chart   Comprehensive metabolic panel     Status: Abnormal   Collection Time: 02/28/20  4:04 AM  Result Value Ref Range   Sodium 147 (H) 135 - 145 mmol/L   Potassium 3.8 3.5 - 5.1 mmol/L   Chloride 106 98 - 111 mmol/L   CO2 33 (H) 22 - 32 mmol/L   Glucose, Bld 220 (H) 70 - 99 mg/dL    Comment: Glucose reference range applies only to samples taken after fasting for at least 8 hours.   BUN 27 (H) 6 - 20 mg/dL   Creatinine, Ser 0.75 0.61 - 1.24 mg/dL   Calcium 11.2 (H) 8.9 - 10.3 mg/dL   Total Protein 6.0 (L) 6.5 - 8.1 g/dL   Albumin 2.8 (L) 3.5 - 5.0 g/dL   AST 25 15 - 41 U/L   ALT 35 0 -  44 U/L   Alkaline Phosphatase 65 38 - 126 U/L   Total Bilirubin 1.9 (H) 0.3 - 1.2 mg/dL   GFR, Estimated >60 >60 mL/min    Comment: (NOTE) Calculated using the CKD-EPI Creatinine Equation (2021)    Anion gap 8 5 - 15    Comment: Performed at [facility] [address] [place], [place] [address]  Magnesium     Status: None   Collection Time: 02/28/20  4:04 AM  Result Value Ref Range   Magnesium 2.0 1.7 - 2.4 mg/dL    Comment: Performed at [facility] [address], [place], [place] [address]  Phosphorus     Status: None   Collection Time:  02/28/20  4:04 AM  Result Value Ref Range   Phosphorus 2.6 2.5 - 4.6 mg/dL    Comment: Performed at [facility] [address], [place], [place] [address]  CBC with Differential/Platelet     Status: Abnormal   Collection Time: 02/28/20  4:04 AM  Result Value Ref Range   WBC 10.9 (H) 4.0 - 10.5 K/uL   RBC 6.64 (H) 4.22 - 5.81 MIL/uL   Hemoglobin 17.9 (H) 13.0 - 17.0 g/dL   HCT 57.3 (H) 39 - 52 %    Comment: Hematocrit >55%, anticoagulant adjusted tube needed for coagulation tests, obtain from laboratory   MCV 86.3 80.0 - 100.0 fL   MCH 27.0 26.0 - 34.0 pg   MCHC 31.2 30.0 - 36.0 g/dL   RDW 21.3 (H) 11.5 - 15.5 %   Platelets 187 150 - 400 K/uL    Comment: REPEATED TO VERIFY   nRBC 0.0 0.0 - 0.2 %   Neutrophils Relative % 90 %   Neutro Abs 9.7 (H) 1.7 - 7.7 K/uL   Lymphocytes Relative 4 %   Lymphs Abs 0.5 (L) 0.7 - 4.0 K/uL   Monocytes Relative 6 %   Monocytes Absolute 0.6 0.1 - 1.0 K/uL   Eosinophils Relative 0 %   Eosinophils Absolute 0.0 0.0 - 0.5 K/uL   Basophils Relative 0 %   Basophils Absolute 0.0 0.0 - 0.1 K/uL   Immature Granulocytes 0 %   Abs Immature Granulocytes 0.04 0.00 - 0.07 K/uL    Comment: Performed at [facility], [address] [place], [place] [address]    Recent Results (from the past 240 hour(s))  Culture, blood (Routine X 2) w Reflex to ID Panel     Status: None (Preliminary result)   Collection Time: 02/26/20  4:37 PM   Specimen: BLOOD LEFT HAND  Result Value Ref Range Status   Specimen Description BLOOD LEFT HAND  Final   Special Requests   Final    BOTTLES DRAWN AEROBIC ONLY Blood Culture results may not be optimal due to an inadequate volume of blood received in culture bottles   Culture   Final    NO GROWTH < 24 HOURS Performed at [facility], [place] [address], [place], [place] [address]    Report Status PENDING  Incomplete  Culture, blood (Routine X 2) w Reflex to ID Panel     Status: None (Preliminary result)    Collection Time: 02/26/20  4:37 PM   Specimen: BLOOD LEFT HAND  Result Value Ref Range Status   Specimen Description BLOOD LEFT HAND  Final   Special Requests   Final    BOTTLES DRAWN AEROBIC ONLY Blood Culture results may not be optimal due to an inadequate  volume of blood received in culture bottles   Culture   Final    NO GROWTH < 24 HOURS Performed at [facility], [place] [address], [place], [place] [address]    Report Status PENDING  Incomplete  MRSA PCR Screening     Status: None   Collection Time: 02/27/20  6:03 AM   Specimen: Nasal Mucosa; Nasopharyngeal  Result Value Ref Range Status   MRSA by PCR NEGATIVE NEGATIVE Final    Comment:        The GeneXpert MRSA Assay (FDA approved for NASAL specimens only), is one component of a comprehensive MRSA colonization surveillance program. It is not intended to diagnose MRSA infection nor to guide or monitor treatment for MRSA infections. Performed at [facility], [place] [address] [place], [place] [address]     Lipid Panel Recent Labs    02/27/20 1038  CHOL 118  TRIG 110  HDL 39*  CHOLHDL 3.0  VLDL 22  LDLCALC 57    Studies/Results: CT ANGIO HEAD W OR WO CONTRAST  Result Date: 02/26/2020 CLINICAL DATA:  Subarachnoid hemorrhage follow up.  Stroke/TIA. EXAM: CT ANGIOGRAPHY HEAD AND NECK TECHNIQUE: Multidetector CT imaging of the head and neck was performed using the standard protocol during bolus administration of intravenous contrast. Multiplanar CT image reconstructions and MIPs were obtained to evaluate the vascular anatomy. Carotid stenosis measurements (when applicable) are obtained utilizing NASCET criteria, using the distal internal carotid diameter as the denominator. CONTRAST:  39m OMNIPAQUE IOHEXOL 350 MG/ML SOLN COMPARISON:  02/23/2020 FINDINGS: CT HEAD FINDINGS Brain: Unchanged small volume subarachnoid hemorrhage over the right convexity. There is also small amount of subarachnoid blood at the right  frontal pole, also unchanged. The size and configuration of the ventricles and extra-axial CSF spaces are normal. 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 The brain parenchyma is normal. Skull: The visualized skull base, calvarium and extracranial soft tissues are normal. Sinuses/Orbits: No fluid levels or advanced mucosal thickening of the visualized paranasal sinuses. No mastoid or middle ear effusion. The orbits are normal. CTA NECK FINDINGS SKELETON: There is no bony spinal canal stenosis. No lytic or blastic lesion. OTHER NECK: Normal pharynx, larynx and major salivary glands. No cervical lymphadenopathy. Unremarkable thyroid gland. UPPER CHEST: No pneumothorax or pleural effusion. No nodules or masses. AORTIC ARCH: There is calcific atherosclerosis of the aortic arch. There is no aneurysm, dissection or hemodynamically significant stenosis of the visualized portion of the aorta. Conventional 3 vessel aortic branching pattern. The visualized proximal subclavian arteries are widely patent. RIGHT CAROTID SYSTEM: No dissection, occlusion or aneurysm. Mild atherosclerotic calcification at the carotid bifurcation without hemodynamically significant stenosis. LEFT CAROTID SYSTEM: Normal without aneurysm, dissection or stenosis. VERTEBRAL ARTERIES: Left dominant configuration. Both origins are clearly patent. There is no dissection, occlusion or flow-limiting stenosis to the skull base (V1-V3 segments). CTA HEAD FINDINGS POSTERIOR CIRCULATION: --Vertebral arteries: Normal V4 segments. --Inferior cerebellar arteries: Normal. --Basilar artery: Normal. --Superior cerebellar arteries: Normal. --Posterior cerebral arteries (PCA): Normal. ANTERIOR CIRCULATION: --Intracranial internal carotid arteries: Normal. --Anterior cerebral arteries (ACA): Normal. Both A1 segments are present. Patent anterior communicating artery (a-comm). --Middle cerebral arteries (MCA): Normal. VENOUS SINUSES: As permitted by contrast timing, patent.  ANATOMIC VARIANTS: None Review of the MIP images confirms the above findings. IMPRESSION: 1. Unchanged small volume subarachnoid hemorrhage over the right convexity and at the right frontal pole. 2. No intracranial arterial occlusion or high-grade stenosis. 3. 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 Aortic Atherosclerosis (ICD10-I70.0). Electronically Signed   By: KLennette Bihari  [place] M.D.   On: 02/26/2020 21:25   DG Abd 1 View  Result Date: 02/26/2020 CLINICAL DATA:  Preprocedure evaluation for MRI EXAM: ABDOMEN - 1 VIEW COMPARISON:  06/29/2019 FINDINGS: Supine frontal views of the abdomen and pelvis are obtained, excluding portions of the left flank by collimation. Minimal hypoventilatory change at the left lung base. Postsurgical changes from median sternotomy, with wires and surgical clips identified in the lower mediastinum. No bowel obstruction or ileus. Excreted contrast from previously performed CT angiogram. No radiopaque foreign bodies within the abdomen or pelvis. No acute bony abnormalities. IMPRESSION: 1. Postsurgical changes from median sternotomy, with mediastinal clips and median sternotomy wires. 2. No radiopaque foreign bodies within the abdomen or pelvis. Electronically Signed   By: Randa Ngo M.D.   On: 02/26/2020 21:22   CT HEAD WO CONTRAST  Result Date: 02/27/2020 CLINICAL DATA:  Acute respiratory failure, seizure EXAM: CT HEAD WITHOUT CONTRAST TECHNIQUE: Contiguous axial images were obtained from the base of the skull through the vertex without intravenous contrast. COMPARISON:  MRI 02/26/2020 FINDINGS: Brain: Moderate parenchymal volume loss again noted, stable since prior examination. Mild periventricular white matter changes are present likely reflecting the sequela of small vessel ischemia. Remote right temporoparietal infarct again noted. Small subarachnoid hemorrhage within the right parietal and right frontal sulci appears stable since prior examination. No interval hemorrhage. No  abnormal mass effect or midline shift. Ventricular size is normal. Cerebellum is unremarkable. Vascular: No asymmetric hyperdense vasculature at the skull base. Skull: Intact Sinuses/Orbits: The visualized paranasal sinuses are clear. The visualized orbits are unremarkable. Other: Mastoid air cells and middle ear cavities are clear IMPRESSION: Stable small subarachnoid hemorrhage. No evidence of interval hemorrhage. No abnormal mass effect or midline shift. Remote right parietotemporal infarct again noted. Electronically Signed   By: Fidela Salisbury MD   On: 02/27/2020 04:45   CT ANGIO NECK W OR WO CONTRAST  Result Date: 02/26/2020 CLINICAL DATA:  Subarachnoid hemorrhage follow up.  Stroke/TIA. EXAM: CT ANGIOGRAPHY HEAD AND NECK TECHNIQUE: Multidetector CT imaging of the head and neck was performed using the standard protocol during bolus administration of intravenous contrast. Multiplanar CT image reconstructions and MIPs were obtained to evaluate the vascular anatomy. Carotid stenosis measurements (when applicable) are obtained utilizing NASCET criteria, using the distal internal carotid diameter as the denominator. CONTRAST:  8m OMNIPAQUE IOHEXOL 350 MG/ML SOLN COMPARISON:  02/23/2020 FINDINGS: CT HEAD FINDINGS Brain: Unchanged small volume subarachnoid hemorrhage over the right convexity. There is also small amount of subarachnoid blood at the right frontal pole, also unchanged. The size and configuration of the ventricles and extra-axial CSF spaces are normal. 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 The brain parenchyma is normal. Skull: The visualized skull base, calvarium and extracranial soft tissues are normal. Sinuses/Orbits: No fluid levels or advanced mucosal thickening of the visualized paranasal sinuses. No mastoid or middle ear effusion. The orbits are normal. CTA NECK FINDINGS SKELETON: There is no bony spinal canal stenosis. No lytic or blastic lesion. OTHER NECK: Normal pharynx, larynx and major  salivary glands. No cervical lymphadenopathy. Unremarkable thyroid gland. UPPER CHEST: No pneumothorax or pleural effusion. No nodules or masses. AORTIC ARCH: There is calcific atherosclerosis of the aortic arch. There is no aneurysm, dissection or hemodynamically significant stenosis of the visualized portion of the aorta. Conventional 3 vessel aortic branching pattern. The visualized proximal subclavian arteries are widely patent. RIGHT CAROTID SYSTEM: No dissection, occlusion or aneurysm. Mild atherosclerotic calcification at the carotid bifurcation without hemodynamically significant stenosis. LEFT CAROTID  SYSTEM: Normal without aneurysm, dissection or stenosis. VERTEBRAL ARTERIES: Left dominant configuration. Both origins are clearly patent. There is no dissection, occlusion or flow-limiting stenosis to the skull base (V1-V3 segments). CTA HEAD FINDINGS POSTERIOR CIRCULATION: --Vertebral arteries: Normal V4 segments. --Inferior cerebellar arteries: Normal. --Basilar artery: Normal. --Superior cerebellar arteries: Normal. --Posterior cerebral arteries (PCA): Normal. ANTERIOR CIRCULATION: --Intracranial internal carotid arteries: Normal. --Anterior cerebral arteries (ACA): Normal. Both A1 segments are present. Patent anterior communicating artery (a-comm). --Middle cerebral arteries (MCA): Normal. VENOUS SINUSES: As permitted by contrast timing, patent. ANATOMIC VARIANTS: None Review of the MIP images confirms the above findings. IMPRESSION: 1. Unchanged small volume subarachnoid hemorrhage over the right convexity and at the right frontal pole. 2. No intracranial arterial occlusion or high-grade stenosis. 3. 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 Aortic Atherosclerosis (ICD10-I70.0). Electronically Signed   By: Ulyses Jarred M.D.   On: 02/26/2020 21:25   MR BRAIN W WO CONTRAST  Result Date: 02/26/2020 CLINICAL DATA:  Subarachnoid hemorrhage follow up EXAM: MRI HEAD WITHOUT AND WITH CONTRAST TECHNIQUE:  Multiplanar, multiecho pulse sequences of the brain and surrounding structures were obtained without and with intravenous contrast. CONTRAST:  7.34m GADAVIST GADOBUTROL 1 MMOL/ML IV SOLN COMPARISON:  Head CT 02/26/2020 FINDINGS: Brain: Small amount of subarachnoid hemorrhage again demonstrated over the right frontal pole and superior right parietal lobe. Multifocal hyperintense T2-weighted signal within the white matter. 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 Advanced atrophy for age. There are areas of leptomeningeal contrast enhancement in the left cerebellum and within the superior cerebral hemispheres. Normal midline structures. Vascular: Normal flow voids. Skull and upper cervical spine: Normal marrow signal. Sinuses/Orbits: Negative. Other: None. IMPRESSION: 1. Small amount of subarachnoid hemorrhage over the right frontal pole and superior right parietal lobe. 2. 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 3. Advanced atrophy and chronic microvascular disease. 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 Electronically Signed   By: KUlyses JarredM.D.   On: 02/26/2020 23:29   Portable Chest x-ray  Result Date: 02/27/2020 CLINICAL DATA:  Endotracheal tube placement EXAM: PORTABLE CHEST 1 VIEW COMPARISON:  02/18/2020 FINDINGS: Endotracheal tube tip is at the level of the clavicular heads. Enteric tube side port projects near the gastric pylorus. The tip is below the field of view. Lungs are hyperinflated. No focal airspace consolidation or pulmonary edema. IMPRESSION: Endotracheal tube tip at the level of the clavicular heads. Electronically Signed   By: KUlyses JarredM.D.   On: 02/27/2020 03:26   Portable EEG  Result Date: 02/27/2020 YLora Havens MD     02/27/2020  8:45 AM Patient Name: Antonio TUMANMRN: [id number] Attending: PLora HavensReferring Physician/Provider:  Dr SDonnetta SimpersDate: 02/27/2020 Duration: 24.24 mins Patient history: 89yoM with left sided twitching.  EEG to evaluate for seizure. Level of alertness:  comatose AEDs during EEG study: Keppra, phenytoin, lacosamide Technical aspects: This EEG study was done with scalp electrodes positioned according to the 10-20 International system of electrode placement. Electrical activity was acquired at a sampling rate of _0  and reviewed with a high frequency filter of _1  and a low frequency filter of _2 . EEG data were recorded continuously and digitally stored. Description: EEG showed continuous generalized 3 to 5 Hz theta delta slowing with overriding 15 to 18 Hz generalized beta activity.  Sharp waves were seen in right frontocentral region at times periodic at 0.25 to  0.5 Hz.  Hyperventilation and  photic stimulation were not performed.   ABNORMALITY -Sharp waves, right frontocentral region -Continuous slow, generalized -Excessive beta, generalized IMPRESSION: This study showed evidence of epileptogenicity arising from right frontocentral region, which is at times periodic and suggestive of underlying structural abnormality with high likelihood of seizures.  Additionally, there is evidence of severe diffuse encephalopathy, nonspecific etiology but could be secondary to sedation, seizures.  No seizures were seen throughout the recording. Priyanka [name]   EEG LTVM - Continuous Bedside W/ Video Includes Portable EEG Read  Result Date: 02/26/2020 Lora Havens, MD     02/27/2020  9:07 AM Patient Name: SHRAVAN SALAHUDDIN MRN: [id number] Epilepsy Attending: Lora Havens Referring Physician/Provider: Dr Lynnae Sandhoff Duration: 02/25/2020 1805 to 02/26/2020 1705  Patient history: 60 year old male with chronic right parietal and temporal strokes who presented with altered mental status.  EEG to evaluate for seizures.  Level of alertness: Awake,asleep  AEDs during EEG study: LEV, LCM  Technical  aspects: This EEG study was done with scalp electrodes positioned according to the 10-20 International system of electrode placement. Electrical activity was acquired at a sampling rate of _0  and reviewed with a high frequency filter of _1  and a low frequency filter of _2 . EEG data were recorded continuously and digitally stored.  Description: No clear posterior dominant rhythm was seen. Sleep was charcaterized by vertex waves, sleep spindles (12-_3 ), maximal frontocentral region.  EEG showed continuous generalized 3 to 6 Hz theta delta slowing admixed with 15-_4  frontocentral beta activity.  Hyperventilation and photic stimulation were not performed.    ABNORMALITY -Continuous slow, generalized  IMPRESSION: This study is suggestive of mild to moderate diffuse encephalopathy, nonspecific etiology. No seizures or epileptiform discharges were seen throughout the recording.  Priyanka [name]    Medications:  Scheduled: . chlorhexidine gluconate (MEDLINE KIT)  15 mL Mouth Rinse BID  . Chlorhexidine Gluconate Cloth  6 each Topical Daily  . feeding supplement (PROSource TF)  45 mL Per Tube TID  . influenza vac split quadrivalent PF  0.5 mL Intramuscular Tomorrow-1000  . insulin aspart  0-6 Units Subcutaneous Q4H  . lactulose  300 mL Rectal BID  . mouth rinse  15 mL Mouth Rinse 10 times per day  . methylPREDNISolone (SOLU-MEDROL) injection  60 mg Intravenous Q24H  . metoprolol tartrate  5 mg Intravenous Q6H  . phenytoin (DILANTIN) IV  100 mg Intravenous Q8H  . pneumococcal 23 valent vaccine  0.5 mL Intramuscular Tomorrow-1000   Continuous: . sodium chloride Stopped (02/28/20 0203)  . sodium chloride    . dextrose 5 % and 0.45% NaCl 20 mL/hr at 02/28/20 0700  . famotidine (PEPCID) IV Stopped (02/27/20 2314)  . feeding supplement (OSMOLITE 1.5 CAL) 30 mL/hr at 02/28/20 0600  . lacosamide (VIMPAT) IV Stopped (02/27/20 2231)  . levETIRAcetam Stopped (02/28/20 0203)  . midazolam 10 mg/hr  (02/28/20 0700)  . norepinephrine (LEVOPHED) Adult infusion Stopped (02/27/20 1904)   Assessment/Recommendations: 59 y.o.malewith history of CAD, COPD, opioid dependencyand tobacco usead[facility] with [facility], hypoxia, hypotension and was intubated (extubated 02/17/20). Hospital course at [facility] complicated by Staph Epi bacteremia, AKI, cardiology consult for SVT (probable rapid afib per Dr Domenic Polite), fluid overload, EF 45-50% by echo, elevated LFTs, previous Rt MCA infarct by CT 02/18/20, seizure activity noted 02/22/20, smallSAHon CT (NS consult Dr Duffy Rhody), EEG 11/10, Neurology consult 11/10 Dr [name] -> Keppra, recurrent seizures - tx'd to [facility] 02/25/20.[place]- right frontoparietal region, source unclear - needs to rule out  endocarditis  CT Head - 11/12 -Stable small volume frontal and parietal subarachnoid hemorrhage.Unchanged right temporoparietal encephalomalacia remained to remote cortical infarct.   CT head -11/11 -Stable subarachnoid hemorrhage in the right anterior mid parietal lobe regions.  CTA head and neck: Unchanged small volume subarachnoid hemorrhage over the right convexity and at the right frontal pole. No intracranial arterial occlusion or high-grade stenosis. 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  MRI headwith and without contrast: Small amount of subarachnoid hemorrhage over the right frontal pole and superior right parietal lobe. 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 Advanced atrophy and chronic microvascular disease. 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  2D Echo- [id number] to 50%. No cardiac source of emboli identified.  VTE prophylaxis -SCDs  Aspirin 81 mg daily and clopidogrel 75 mg dailyprior to admission, now on No antithrombotic.  Seizure   11/10 episode of right sided shaking followed by right sided  hemiplegia. 11/12 overnight more episode of right sided tonic clonic activity, and more altered. EEG - 11/14 showed continuous slow, generalized.No seizures or epileptiform discharges were seen throughout the recording. Was loaded with keppra and keppra 564m Q8h->10021mQ8h. Vimpat 20039mid also started. Overnight Sun-Mon 11/15 the patient exhibited intermittent LUE twitching concerning for seizure recurrence. Reintubated overnight (11/15) for airway protection due to seizure activity concerning for possible status epilepticus, with associated obtundation and unresponsiveness between bouts of seizure activity. Of note, the seizure acitivity resolved after Versed for intubation. LTM EEG was started. Versed had been stopped and on Monday 11/15 the patient had a second recurrence of seizure like activity with downward gaze and left arm twitching at about 7:50 AM. Per Dr. YadHortense RamalEG revealed right sided epileptiform discharges. Fosphenytoin load was ordered and Dilantin scheduled dosing started at 100 mg TID.  Now on Keppra, Vimpat and Dilantin, as well as Versed gtt at 10 mg/hr.  Dilantin level Monday evening was therapeutic at 17.8  PLEDs now resolved on LTM EEG per Dr. YadHortense Ramal Tachyarrhythmia   Noted SVT vs. Afib RVR in [facility] then [facility]. Was not an AC candidate for now given substance use, noncompliance and current SAH  Will need follow up with neurology as outpatient to see if he is a candidate later  Hypercarbic respiratory failure Sepsis with leukocytosis COPD exacerbation  Reintubated overnight on 11/15  On solumedrol  Management and blood culture follow up per CCM  Cardiomyopathy; rule out endocarditis  EF 55-60% in 05/2019  EF 45-50% this admission  Given SAH and leukocytosis - Repeat B Cx was ordered 11/14  If B Cx positive, will need TEE to rule out endocarditis  Hypertension  Home BP meds: none  Current BP meds:metoprolol  Blood pressure high at times  Add  amlodipine  BP goal < 160  Long-term BP goal normotensive  Hyperlipidemia  Home Lipid lowering medication:Lipitor40 mg daily  LDLpending, goal < 70  Hold off statin for now due to [facility]in[facility] statin at discharge  Dysphagia   Did not pass swallow  On TF  Speech to follow  Tobacco abuse  Current smoker  Smoking cessation counselingwill beprovided  Other Stroke Risk Factors  Advanced age  Hx stroke/TIA- right parietal old infarct on imaging  Coronary artery diseaseon DAPT PTA  Hx of substanceabuse  Other Active Problems  Code status -Full code  35 minutes spent in the neurological evaluation and management of this critically ill patient.  LOS: 14 days   _0  signed: Dr. Kerney Elbe 02/28/2020  7:41 AM

## 2020-02-28 NOTE — Progress Notes (Addendum)
NAME:  Antonio Ramirez, MRN:  [id number], DOB:  [birth year], LOS: 53 ADMISSION DATE:  02/14/2020, CONSULTATION DATE:  02/27/20 REFERRING MD: Marlowe Sax CHIEF COMPLAINT:  Acute encephalopathy, seizures, status epilepticus  Brief History   59 year old man with COPD, hx of Drug abuse, admitted with hypercarbic respiratory failure, intubated in the ER11/2, not responsive to narcan.     History of present illness    59 y.o. male with medical history significant for opiate dependence, COPD, carotid artery disease, depression.  Initially admitted with AMS on 11/2.  No respiratory symptoms, but hypoxic to 70s at the time of admission on RA.  No known abuse of medications.  Took oxycodone per schedule pta.   Received 2 doses of Moderna Vaccine- April/May. He was intubated in the ED for acute hypoxic and hypercarbic respiratory failure. Extubated 11/5.  Remained sleepy 11/6-11/7, intermittent agitation 11/8 Increasingly alert 11/9 after bipap.  11/10 per cards note not following commands, jerking on R side.  Started on Keppra bolus and then 500 mg TID.  11/10 non responsive per neuro note. 11/11 still lethargic.  11/12persistent seizures, sent to [facility] for cont EEG.  11/13 transferred from [facility], needed increased O2 (16L NRB).  Withdrawing to stimuti. Persistent seizures, babbling incoherently.  11/14 am - able to say name, but + dysarthria. Awake but incoherent, not following commands, withdrawing to stimuli.  Completed 24 hr EEG. "IMPRESSION: This study is suggestive of mild to moderate diffuse encephalopathy, nonspecific etiology.No seizures or epileptiform discharges were seen throughout the recording." Antiplt agents held.   11/15: developed status epilepticus, no improvement with ativan 37m x 3 (?), completely obtunded and non responsive between bouts of seizure activity. Non responsive to any stimuli on my exam.  Intubated for airway protection.   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 No evidence of interval hemorrhage. No abnormal mass effect or midline shift. Remote right parietotemporal infarct again noted.   Micro Data:  Urine 11/2  > neg  BC x 2   11/2 >>>staph epidermidis BC x 2  11/4 >>> MRSA PCR  11/3  Neg    Antimicrobials:  maxepime  11/2 x1 Flagyl  11/2 -11/3 Vanc  11/2x1 Ancef 11/3 >>>11/7   Scheduled Meds: . chlorhexidine gluconate (MEDLINE KIT)  15 mL Mouth Rinse BID  . Chlorhexidine Gluconate Cloth  6 each Topical Daily  . famotidine  20 mg Per Tube BID  . feeding supplement (PROSource TF)  45 mL Per Tube TID  . influenza vac split quadrivalent PF  0.5 mL Intramuscular Tomorrow-1000  . insulin aspart  0-6 Units Subcutaneous Q4H  . mouth rinse  15 mL Mouth Rinse 10 times per day  . methylPREDNISolone (SOLU-MEDROL) injection  60 mg Intravenous Q24H  .  metoprolol tartrate  5 mg Intravenous Q6H  . phenytoin (DILANTIN) IV  100 mg Intravenous Q8H  . pneumococcal 23 valent vaccine  0.5 mL Intramuscular Tomorrow-1000   Continuous Infusions: . sodium chloride Stopped (02/28/20 0203)  . sodium chloride    . dextrose 5 % and 0.45% NaCl 20 mL/hr at 02/28/20 0800  . feeding supplement (OSMOLITE 1.5 CAL) 30 mL/hr at 02/28/20 0600  . lacosamide (VIMPAT) IV Stopped  (02/27/20 2231)  . levETIRAcetam 1,000 mg (02/28/20 0921)  . midazolam 10 mg/hr (02/28/20 0800)  . norepinephrine (LEVOPHED) Adult infusion Stopped (02/27/20 1904)    Interim history/subjective:  Remains intubated.  On tube feedings.  Abnormal activity this a.m. being evaluated by neurology  Objective   Blood pressure 100/73, pulse 71, temperature 97.6 F (36.4 C), temperature source Axillary, resp. rate 16, height 6' (1.829 m), weight 77 kg, SpO2 98 %.    Vent Mode: PRVC FiO2 (%):  [40 %-50 %] 40 % Set Rate:  [16 bmp] 16 bmp Vt Set:  [620 mL] 620 mL PEEP:  [5 cmH20] 5 cmH20 Plateau Pressure:  [14 cmH20-16 cmH20] 14 cmH20   Intake/Output Summary (Last 24 hours) at 02/28/2020 0957 Last data filed at 02/28/2020 0800 Gross per 24 hour  Intake 1139.98 ml  Output 675 ml  Net 464.98 ml   Filed Weights   02/19/20 0500 02/26/20 1229 02/27/20 0500  Weight: 82.5 kg 77 kg 77 kg    Examination: General: Disheveled male who is currently sedated HEENT: Poor dentition multiple missing teeth Neuro: Questionable abnormal movement with right upper extremity CV: Currently sinus rhythm PULM: Coarse rhonchi Vent currently pressure regulated volume control FIO2 40% PEEP 5 RATE 16 VT 620  GI: soft, bsx4 active  GU: Amber urine Extremities: warm/dry,  edema  Skin: no rashes or lesions   Resolved Hospital Problem list    hypotension AKI  Assessment & Plan:   Critically ill due to suspected status epilepticus.  Remains encephalopathic dye to post-ictal/interictal state Continue anxiolytics On sedation due to questionable new seizure activity    Critically ill due to acute acute hypoxemic hypercarbic respiratory failure:  Wean per protocol Acute exacerbation of severe COPD. Improving with steroids and prolonged expiration no longer present. Continue steroids Continue bronchodilators  Cor pulmonale by Echo this admit with neg perfusion scan ruling out CTEPH.  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 Change time ,8 Serial chest x-rays Wean FiO2 as able 02/28/2020 questionable seizure activity currently on heavy sedation  svt afib paroxysmal.   Currently on beta-blocker Currently in sinus rhythm  Best practice:  Diet: Tube feedings Pain/Anxiety/Delirium protocol (if indicated): fentanyl only.  VAP protocol (if indicated): bundle in place.  DVT prophylaxis:  lovenox GI prophylaxis: pepcid Glucose control: ISS prn Mobility: bedrest Code Status: Full Code  Family Communication: HCPOA [name] Hill updated 02/28/20, Made a  DNR per her request. States he's been declining since 2014. Disposition: ICU  Labs   CBC: Recent Labs  Lab 02/23/20 0408 02/23/20 0408 02/24/20 0233 02/26/20 0112 02/27/20 0406 02/27/20 1038 02/28/20 0404  WBC 11.2*  --  11.7* 16.5*  --  14.3* 10.9*  NEUTROABS  --   --   --  16.2*  --  12.5* 9.7*  HGB 16.0   < > 15.9 17.6* 18.0* 16.5 17.9*  HCT 50.8   < > 52.1* 56.6* 53.0* 52.7* 57.3*  MCV 87.0  --  89.1 88.7  --  86.1 86.3  PLT 197  --  186 172  --  197 187   < > = values in this interval not displayed.    Basic Metabolic Panel: Recent Labs  Lab 02/23/20 0408 02/23/20 0408 02/24/20 0233 02/26/20 0112 02/27/20 0406 02/27/20 1038 02/27/20 1802 02/28/20 0404  NA 142   < > 141 143 145 147*  --  147*  K 2.4*   < > 3.1* 4.5 3.3* 3.5  --  3.8  CL 91*  --  93* 102  --  102  --  106  CO2 41*  --  38* 32  --  32  --  33*  GLUCOSE 95  --  98 125*  --  129*  --  220*  BUN 16  --  17 17  --  19  --  27*  CREATININE 0.63  --  0.73 0.78  --  0.93  --  0.75  CALCIUM 9.6  --  9.5 10.9*  --  10.9*  --  11.2*  MG 1.9  --   --  1.9  --  1.8 1.7 2.0  PHOS  --   --   --  2.4*  --  2.7 1.8* 2.6   < > = values in this interval not displayed.   GFR: Estimated Creatinine Clearance: 106.9 mL/min (by C-G formula based on SCr of 0.75 mg/dL). Recent Labs  Lab  02/24/20 0233 02/26/20 0112 02/27/20 1038 02/28/20 0404  WBC 11.7* 16.5* 14.3* 10.9*  LATICACIDVEN 0.9  --   --   --     Liver Function Tests: Recent Labs  Lab 02/23/20 0408 02/24/20 0233 02/26/20 0112 02/27/20 1038 02/28/20 0404  AST 48* 42* 32 31 25  ALT 47* 42 38 37 35  ALKPHOS 66 62 70 68 65  BILITOT 2.3* 2.2* 2.1* 2.3* 1.9*  PROT 5.8* 5.7* 6.3* 6.1* 6.0*  ALBUMIN 3.0* 2.8* 3.0* 2.9* 2.8*   No results for input(s): LIPASE, AMYLASE in the last 168 hours. Recent Labs  Lab 02/24/20 0233  AMMONIA 53*    ABG    Component Value Date/Time   PHART 7.483 (H) 02/27/2020 0406   PCO2ART 49.7 (H) 02/27/2020 0406   PO2ART 64 (L) 02/27/2020 0406   HCO3 37.4 (H) 02/27/2020 0406   TCO2 39 (H) 02/27/2020 0406   O2SAT 93.0 02/27/2020 0406     Coagulation Profile: No results for input(s): INR, PROTIME in the last 168 hours.  Cardiac Enzymes: No results for input(s): CKTOTAL, CKMB, CKMBINDEX, TROPONINI in the last 168 hours.  HbA1C: Hgb A1c MFr Bld  Date/Time Value Ref Range Status  02/15/2020 01:42 AM 6.9 (H) 4.8 - 5.6 % Final    Comment:    (NOTE) Pre diabetes:          5.7%-6.4%  Diabetes:              >6.4%  Glycemic control for   <7.0% adults with diabetes   06/01/2019 07:34 AM 6.2 (H) 4.8 - 5.6 % Final    Comment:    (NOTE) Pre diabetes:          5.7%-6.4% Diabetes:              >6.4% Glycemic control for   <7.0% adults with diabetes     CBG: Recent Labs  Lab 02/27/20 1607 02/27/20 1923 02/27/20 2318 02/28/20 0314 02/28/20 0846  GLUCAP 127* 180* 166* 154* 176*   CRITICAL CARE Performed  by:  Gaylyn Lambert ACNP Acute Care Nurse Practitioner [place] Please consult Amion 02/28/2020, 9:57 AM  App cct 30 min
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
## 2020-02-28 NOTE — Procedures (Signed)
Patient Name: Antonio Ramirez  MRN: [id number]  Epilepsy Attending: Lora Havens  Referring Physician/Provider: Dr Donnetta Simpers Duration:  02/27/2020 0603 to 02/28/2020 0603  Patient history: 59yo M with left sided twitching.  EEG to evaluate for seizure.  Level of alertness:  comatose  AEDs during EEG study: Keppra, phenytoin, lacosamide, versed  Technical aspects: This EEG study was done with scalp electrodes positioned according to the 10-20 International system of electrode placement. Electrical activity was acquired at a sampling rate of 500Hz  and reviewed with a high frequency filter of 70Hz  and a low frequency filter of 1Hz . EEG data were recorded continuously and digitally stored.   Description: EEG initially showed  continuous generalized 3 to 5 Hz theta delta slowing with overriding 15 to 18 Hz generalized beta activity.  Sharp waves were seen in right frontocentral region at times periodic at 0.25 to  0.5 Hz.  Event button was pressed on 02/27/2020 at 0733 and 1505 for left arm rhythmic twitching which was difficult to visualize on video.  Concomitant EEG showed  increased frequency of sharp waves in her right frontocentral region to 1 Hz. Patient was then started on IV Versed after which EEG showed generalized predominantly 6-9Hz  theta-alpha activity and 15-18Hz  beta activity lasting 4-6 seconds alternating with 2-4 seconds of eeg suppression. Frequency of sharp waves reduced after versed.  ABNORMALITY -Focal motor seizure, right frontocentral region. - Sharp waves, right frontocentral region - Continuous slow, generalized  IMPRESSION: This study  showed two seizures on 02/27/2020 at 0733 and 1505 arising from right frontocentral region during which patient was noted to have left arm twitching.  Patient was then started on IV Versed drip after which EEG was suggestive of severe diffuse encephalopathy, likely secondary to sedation.  The frequency of sharp waves also  improved after starting IV Versed and were not  periodic in nature.   [name] [name]

## 2020-02-29 ENCOUNTER — Inpatient Hospital Stay (HOSPITAL_COMMUNITY): Payer: Medicare Other

## 2020-02-29 DIAGNOSIS — Z978 Presence of other specified devices: Secondary | ICD-10-CM | POA: Diagnosis not present

## 2020-02-29 DIAGNOSIS — J441 Chronic obstructive pulmonary disease with (acute) exacerbation: Secondary | ICD-10-CM | POA: Diagnosis not present

## 2020-02-29 DIAGNOSIS — R4182 Altered mental status, unspecified: Secondary | ICD-10-CM | POA: Diagnosis not present

## 2020-02-29 DIAGNOSIS — J9602 Acute respiratory failure with hypercapnia: Secondary | ICD-10-CM | POA: Diagnosis not present

## 2020-02-29 DIAGNOSIS — R569 Unspecified convulsions: Secondary | ICD-10-CM | POA: Diagnosis not present

## 2020-02-29 DIAGNOSIS — Z515 Encounter for palliative care: Secondary | ICD-10-CM | POA: Diagnosis not present

## 2020-02-29 DIAGNOSIS — J9601 Acute respiratory failure with hypoxia: Secondary | ICD-10-CM | POA: Diagnosis not present

## 2020-02-29 DIAGNOSIS — Z66 Do not resuscitate: Secondary | ICD-10-CM

## 2020-02-29 LAB — GLUCOSE, CAPILLARY
Glucose-Capillary: 124 mg/dL — ABNORMAL HIGH (ref 70–99)
Glucose-Capillary: 130 mg/dL — ABNORMAL HIGH (ref 70–99)
Glucose-Capillary: 123 mg/dL — ABNORMAL HIGH (ref 70–99)
Glucose-Capillary: 124 mg/dL — ABNORMAL HIGH (ref 70–99)
Glucose-Capillary: 131 mg/dL — ABNORMAL HIGH (ref 70–99)
Glucose-Capillary: 121 mg/dL — ABNORMAL HIGH (ref 70–99)

## 2020-02-29 LAB — PHOSPHORUS: Phosphorus: 2.4 mg/dL — ABNORMAL LOW (ref 2.5–4.6)

## 2020-02-29 LAB — COMPREHENSIVE METABOLIC PANEL
ALT: 34 U/L (ref 0–44)
AST: 27 U/L (ref 15–41)
Albumin: 2.7 g/dL — ABNORMAL LOW (ref 3.5–5.0)
Alkaline Phosphatase: 72 U/L (ref 38–126)
Anion gap: 11 (ref 5–15)
BUN: 36 mg/dL — ABNORMAL HIGH (ref 6–20)
CO2: 29 mmol/L (ref 22–32)
Calcium: 11.5 mg/dL — ABNORMAL HIGH (ref 8.9–10.3)
Chloride: 109 mmol/L (ref 98–111)
Creatinine, Ser: 0.8 mg/dL (ref 0.61–1.24)
GFR, Estimated: >60 mL/min (ref >60)
Glucose, Bld: 181 mg/dL — ABNORMAL HIGH (ref 70–99)
Potassium: 3.7 mmol/L (ref 3.5–5.1)
Sodium: 149 mmol/L — ABNORMAL HIGH (ref 135–145)
Total Bilirubin: 1.2 mg/dL (ref 0.3–1.2)
Total Protein: 5.8 g/dL — ABNORMAL LOW (ref 6.5–8.1)

## 2020-02-29 LAB — CBC WITH DIFFERENTIAL/PLATELET
Abs Immature Granulocytes: 0.07 10*3/uL (ref 0.00–0.07)
Basophils Absolute: 0 10*3/uL (ref 0.0–0.1)
Basophils Relative: 0 %
Eosinophils Absolute: 0 10*3/uL (ref 0.0–0.5)
Eosinophils Relative: 0 %
HCT: 57.9 % — ABNORMAL HIGH (ref 39.0–52.0)
Hemoglobin: 18.2 g/dL — ABNORMAL HIGH (ref 13.0–17.0)
Immature Granulocytes: 0 %
Lymphocytes Relative: 3 %
Lymphs Abs: 0.5 10*3/uL — ABNORMAL LOW (ref 0.7–4.0)
MCH: 27.3 pg (ref 26.0–34.0)
MCHC: 31.4 g/dL (ref 30.0–36.0)
MCV: 86.8 fL (ref 80.0–100.0)
Monocytes Absolute: 1 10*3/uL (ref 0.1–1.0)
Monocytes Relative: 6 %
Neutro Abs: 15.8 10*3/uL — ABNORMAL HIGH (ref 1.7–7.7)
Neutrophils Relative %: 91 %
Platelets: 225 10*3/uL (ref 150–400)
RBC: 6.67 MIL/uL — ABNORMAL HIGH (ref 4.22–5.81)
RDW: 21.9 % — ABNORMAL HIGH (ref 11.5–15.5)
WBC: 17.3 10*3/uL — ABNORMAL HIGH (ref 4.0–10.5)
nRBC: 0 % (ref 0.0–0.2)

## 2020-02-29 LAB — PHENYTOIN LEVEL, TOTAL: Phenytoin Lvl: 19.5 ug/mL (ref 10.0–20.0)

## 2020-02-29 LAB — AMMONIA: Ammonia: 43 umol/L — ABNORMAL HIGH (ref 9–35)

## 2020-02-29 LAB — MAGNESIUM: Magnesium: 1.7 mg/dL (ref 1.7–2.4)

## 2020-02-29 MED ORDER — PHENYTOIN SODIUM 50 MG/ML IJ SOLN
75.0000 mg | Freq: Three times a day (TID) | INTRAMUSCULAR | Status: DC
  Administered 2020-02-29 – 2020-03-02 (×6): 75 mg via INTRAVENOUS
  Filled 2020-02-29 (×6): qty 2

## 2020-02-29 MED ORDER — LEVETIRACETAM IN NACL 1500 MG/100ML IV SOLN
1500.0000 mg | Freq: Two times a day (BID) | INTRAVENOUS | Status: DC
  Administered 2020-02-29 – 2020-03-01 (×2): 1500 mg via INTRAVENOUS
  Filled 2020-02-29 (×2): qty 100

## 2020-02-29 NOTE — Progress Notes (Signed)
PT Cancellation Note  Patient Details Name: Antonio Ramirez MRN: [id number] DOB: Jul 03, 1960   Cancelled Treatment:    Reason Eval/Treat Not Completed: Patient not medically ready.  Pt on the vent, sedation weaned, but pt still with low arousal.  Will check 11/18 to see if more appropriate. 02/29/2020  Ginger Carne., PT Acute Rehabilitation Services [phone]  (pager) [phone]  (office)   Tessie Fass Itzamara Casas 02/29/2020, 3:26 PM

## 2020-02-29 NOTE — Procedures (Signed)
Patient Name:Antonio Ramirez FGH:[id number] Epilepsy Attending:Janica Eldred [name] Referring Physician/Provider:Dr Donnetta Simpers Duration: 02/28/2020 0603 to 02/29/2020 0603  Patient CVELFYB:01BP M with left sided twitching.EEG to evaluate for seizure.  Level of alertness:comatose  AEDs during EEG study:Keppra, phenytoin, lacosamide, versed  Technical aspects: This EEG study was done with scalp electrodes positioned according to the 10-20 International system of electrode placement. Electrical activity was acquired at a sampling rate of 500Hz  and reviewed with a high frequency filter of 70Hz  and a low frequency filter of 1Hz . EEG data were recorded continuously and digitally stored.   Description:EEG showed near continuous generalized polymorphic mixed frequencies with predominantly 6 to 9 Hz theta-alpha activity as well as intermittent 2 to 3 Hz generalized delta activity.  Brief 1 to 3-second periods of generalized EEG attenuation also noted.  Sharp transients were seen in vertex region.  ABNORMALITY - Continuous slow, generalized  IMPRESSION: This studyis suggestive of severe diffuse encephalopathy, likely secondary to sedation.    No seizures or definite epileptiform discharges were seen during the study.  Cammie Faulstich [name]

## 2020-02-29 NOTE — Progress Notes (Signed)
OT Cancellation Note  Patient Details Name: Antonio Ramirez MRN: [id number] DOB: 1960/08/05   Cancelled Treatment:    Reason Eval/Treat Not Completed: Patient not medically ready ; will follow.  Lou Cal, OT Acute Rehabilitation Services Pager [phone] Office [phone]  Raymondo Band 02/29/2020, 5:22 PM
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
## 2020-02-29 NOTE — Progress Notes (Signed)
Subjective: Pt can not participate due to mental status. Per RN, pt's sedation was turned off at 0926 and pt is still basically unresponsive. NP also spoke to palliative care NP who stated that she thinks pt's S.O, will elect comfort care once neuro makes a decision about EEGs.   Objective: Current vital signs: BP (!) 172/122    Pulse 100    Temp 98.4 F (36.9 C) (Axillary)    Resp (!) 30    Ht 6' (1.829 m)    Wt 77.5 kg    SpO2 97%    BMI 23.17 kg/m  Vital signs in last 24 hours: Temp:  [97.4 F (36.3 C)-98.4 F (36.9 C)] 98.4 F (36.9 C) (11/17 0830) Pulse Rate:  [69-103] 100 (11/17 0900) Resp:  [14-30] 30 (11/17 0900) BP: (66-172)/(50-128) 172/122 (11/17 0900) SpO2:  [94 %-100 %] 97 % (11/17 0900) FiO2 (%):  [40 %] 40 % (11/17 0400) Weight:  [77.5 kg] 77.5 kg (11/17 0428)  Intake/Output from previous day: 11/16 0701 - 11/17 0700 In: 2211.6 [I.V.:275.5; NG/GT:1550; IV Piggyback:386.1] Out: 750 [Urine:750] Intake/Output this shift: Total I/O In: 19.4 [I.V.:19.4] Out: -  Nutritional status:  Diet Order            Diet NPO time specified  Diet effective now                 Neurologic Exam: Mental status: Eyes closed. Does not open. Non verbal.  CNs I, II, II, IV, V, VI: unable to assess VII-face is symmetrical, but unresponsive for more thorough exam.  VIII- no response to verbal stimuli IX, X: intubated, non verbal XI- unable to assess XII- unable to assess.  Motor: Does not respond to sternal rub. Withdraws LEs to noxious stimuli, right greater than left. UEs are flaccid. No purposeful or spontaneous movement to extremities.  Reflexes: 2+ UE, unable to ilicit LEs.   Lab Results: Results for orders placed or performed during the hospital encounter of 02/14/20 (from the past 48 hour(s))  Comprehensive metabolic panel     Status: Abnormal   Collection Time: 02/27/20 10:38 AM  Result Value Ref Range   Sodium 147 (H) 135 - 145 mmol/L   Potassium 3.5 3.5 - 5.1  mmol/L   Chloride 102 98 - 111 mmol/L   CO2 32 22 - 32 mmol/L   Glucose, Bld 129 (H) 70 - 99 mg/dL    Comment: Glucose reference range applies only to samples taken after fasting for at least 8 hours.   BUN 19 6 - 20 mg/dL   Creatinine, Ser 0.93 0.61 - 1.24 mg/dL   Calcium 10.9 (H) 8.9 - 10.3 mg/dL   Total Protein 6.1 (L) 6.5 - 8.1 g/dL   Albumin 2.9 (L) 3.5 - 5.0 g/dL   AST 31 15 - 41 U/L   ALT 37 0 - 44 U/L   Alkaline Phosphatase 68 38 - 126 U/L   Total Bilirubin 2.3 (H) 0.3 - 1.2 mg/dL   GFR, Estimated >60 >60 mL/min    Comment: (NOTE) Calculated using the CKD-EPI Creatinine Equation (2021)    Anion gap 13 5 - 15    Comment: Performed at [facility], [place] [address], [place], [place] [address]  Magnesium     Status: None   Collection Time: 02/27/20 10:38 AM  Result Value Ref Range   Magnesium 1.8 1.7 - 2.4 mg/dL    Comment: Performed at [facility] [address], [place],  [address]  Phosphorus     Status: None   Collection Time: 02/27/20 10:38 AM  Result Value Ref Range   Phosphorus 2.7 2.5 - 4.6 mg/dL    Comment: Performed at [facility], [place] [address] [place], [place] [address]  CBC with Differential/Platelet     Status: Abnormal   Collection Time: 02/27/20 10:38 AM  Result Value Ref Range   WBC 14.3 (H) 4.0 - 10.5 K/uL   RBC 6.12 (H) 4.22 - 5.81 MIL/uL   Hemoglobin 16.5 13.0 - 17.0 g/dL   HCT 52.7 (H) 39 - 52 %   MCV 86.1 80.0 - 100.0 fL   MCH 27.0 26.0 - 34.0 pg   MCHC 31.3 30.0 - 36.0 g/dL   RDW 20.8 (H) 11.5 - 15.5 %   Platelets 197 150 - 400 K/uL    Comment: REPEATED TO VERIFY   nRBC 0.0 0.0 - 0.2 %   Neutrophils Relative % 88 %   Neutro Abs 12.5 (H) 1.7 - 7.7 K/uL   Lymphocytes Relative 4 %   Lymphs Abs 0.5 (L) 0.7 - 4.0 K/uL   Monocytes Relative 8 %   Monocytes Absolute 1.2 (H) 0.1 - 1.0 K/uL   Eosinophils Relative 0 %   Eosinophils Absolute 0.0 0.0 - 0.5 K/uL   Basophils Relative 0 %   Basophils Absolute 0.0 0.0 - 0.1  K/uL   Immature Granulocytes 0 %   Abs Immature Granulocytes 0.06 0.00 - 0.07 K/uL    Comment: Performed at [facility], [place] [address], [place], [place] [address]  Lipid panel     Status: Abnormal   Collection Time: 02/27/20 10:38 AM  Result Value Ref Range   Cholesterol 118 0 - 200 mg/dL   Triglycerides 110 <150 mg/dL   HDL 39 (L) >40 mg/dL   Total CHOL/HDL Ratio 3.0 RATIO   VLDL 22 0 - 40 mg/dL   LDL Cholesterol 57 0 - 99 mg/dL    Comment:        Total Cholesterol/HDL:CHD Risk Coronary Heart Disease Risk Table                     Men   Women  1/2 Average Risk   3.4   3.3  Average Risk       5.0   4.4  2 X Average Risk   9.6   7.1  3 X Average Risk  23.4   11.0        Use the calculated Patient Ratio above and the CHD Risk Table to determine the patient's CHD Risk.        ATP III CLASSIFICATION (LDL):  <100     mg/dL   Optimal  100-129  mg/dL   Near or Above                    Optimal  130-159  mg/dL   Borderline  160-189  mg/dL   High  >190     mg/dL   Very High Performed at [facility] [address], [place], [place] [address]   Glucose, capillary     Status: Abnormal   Collection Time: 02/27/20 11:35 AM  Result Value Ref Range   Glucose-Capillary 116 (H) 70 - 99 mg/dL    Comment: Glucose reference range applies only to samples taken after fasting for at least 8 hours.  Glucose, capillary     Status: Abnormal   Collection Time: 02/27/20  4:07 PM  Result Value Ref Range   Glucose-Capillary 127 (H) 70 - 99 mg/dL    Comment: Glucose reference range applies only to samples taken after fasting for at least 8 hours.  Magnesium     Status: None   Collection Time: 02/27/20  6:02 PM  Result Value Ref Range   Magnesium 1.7 1.7 - 2.4 mg/dL    Comment: Performed at [facility], [place] [address] [place], [place] [address]  Phosphorus     Status: Abnormal   Collection Time: 02/27/20  6:02 PM  Result Value Ref Range   Phosphorus 1.8 (L) 2.5 - 4.6 mg/dL     Comment: Performed at [facility] [address], [place], [place] [address]  Phenytoin level, total     Status: None   Collection Time: 02/27/20  6:02 PM  Result Value Ref Range   Phenytoin Lvl 17.8 10.0 - 20.0 ug/mL    Comment: Performed at [facility] [address] [place], [place] [address]  Glucose, capillary     Status: Abnormal   Collection Time: 02/27/20  7:23 PM  Result Value Ref Range   Glucose-Capillary 180 (H) 70 - 99 mg/dL    Comment: Glucose reference range applies only to samples taken after fasting for at least 8 hours.   Comment 1 Notify RN    Comment 2 Document in Chart   Glucose, capillary     Status: Abnormal   Collection Time: 02/27/20 11:18 PM  Result Value Ref Range   Glucose-Capillary 166 (H) 70 - 99 mg/dL    Comment: Glucose reference range applies only to samples taken after fasting for at least 8 hours.   Comment 1 Notify RN    Comment 2 Document in Chart   Glucose, capillary     Status: Abnormal   Collection Time: 02/28/20  3:14 AM  Result Value Ref Range   Glucose-Capillary 154 (H) 70 - 99 mg/dL    Comment: Glucose reference range applies only to samples taken after fasting for at least 8 hours.   Comment 1 Notify RN    Comment 2 Document in Chart   Comprehensive metabolic panel     Status: Abnormal   Collection Time: 02/28/20  4:04 AM  Result Value Ref Range   Sodium 147 (H) 135 - 145 mmol/L   Potassium 3.8 3.5 - 5.1 mmol/L   Chloride 106 98 - 111 mmol/L   CO2 33 (H) 22 - 32 mmol/L   Glucose, Bld 220 (H) 70 - 99 mg/dL    Comment: Glucose reference range applies only to samples taken after fasting for at least 8 hours.   BUN 27 (H) 6 - 20 mg/dL   Creatinine, Ser 0.75 0.61 - 1.24 mg/dL   Calcium 11.2 (H) 8.9 - 10.3 mg/dL   Total Protein 6.0 (L) 6.5 - 8.1 g/dL   Albumin 2.8 (L) 3.5 - 5.0 g/dL   AST 25 15 - 41 U/L   ALT 35 0 - 44 U/L   Alkaline Phosphatase 65 38 - 126 U/L   Total Bilirubin 1.9 (H) 0.3 - 1.2 mg/dL   GFR,  Estimated >60 >60 mL/min    Comment: (NOTE) Calculated using the CKD-EPI Creatinine Equation (2021)    Anion gap 8 5 - 15    Comment: Performed at [facility], [place] [address] [place],  [address]  Magnesium     Status: None   Collection Time: 02/28/20  4:04  AM  Result Value Ref Range   Magnesium 2.0 1.7 - 2.4 mg/dL    Comment: Performed at [facility], [place] [address], [place], [place] [address]  Phosphorus     Status: None   Collection Time: 02/28/20  4:04 AM  Result Value Ref Range   Phosphorus 2.6 2.5 - 4.6 mg/dL    Comment: Performed at [facility] [address] [place], [place] [address]  CBC with Differential/Platelet     Status: Abnormal   Collection Time: 02/28/20  4:04 AM  Result Value Ref Range   WBC 10.9 (H) 4.0 - 10.5 K/uL   RBC 6.64 (H) 4.22 - 5.81 MIL/uL   Hemoglobin 17.9 (H) 13.0 - 17.0 g/dL   HCT 57.3 (H) 39 - 52 %    Comment: Hematocrit >55%, anticoagulant adjusted tube needed for coagulation tests, obtain from laboratory   MCV 86.3 80.0 - 100.0 fL   MCH 27.0 26.0 - 34.0 pg   MCHC 31.2 30.0 - 36.0 g/dL   RDW 21.3 (H) 11.5 - 15.5 %   Platelets 187 150 - 400 K/uL    Comment: REPEATED TO VERIFY   nRBC 0.0 0.0 - 0.2 %   Neutrophils Relative % 90 %   Neutro Abs 9.7 (H) 1.7 - 7.7 K/uL   Lymphocytes Relative 4 %   Lymphs Abs 0.5 (L) 0.7 - 4.0 K/uL   Monocytes Relative 6 %   Monocytes Absolute 0.6 0.1 - 1.0 K/uL   Eosinophils Relative 0 %   Eosinophils Absolute 0.0 0.0 - 0.5 K/uL   Basophils Relative 0 %   Basophils Absolute 0.0 0.0 - 0.1 K/uL   Immature Granulocytes 0 %   Abs Immature Granulocytes 0.04 0.00 - 0.07 K/uL    Comment: Performed at [facility], [address], [place], [place] [address]  Glucose, capillary     Status: Abnormal   Collection Time: 02/28/20  8:46 AM  Result Value Ref Range   Glucose-Capillary 176 (H) 70 - 99 mg/dL    Comment: Glucose reference range applies only to samples taken after fasting for  at least 8 hours.  Glucose, capillary     Status: Abnormal   Collection Time: 02/28/20 11:41 AM  Result Value Ref Range   Glucose-Capillary 147 (H) 70 - 99 mg/dL    Comment: Glucose reference range applies only to samples taken after fasting for at least 8 hours.  Glucose, capillary     Status: Abnormal   Collection Time: 02/28/20  3:49 PM  Result Value Ref Range   Glucose-Capillary 122 (H) 70 - 99 mg/dL    Comment: Glucose reference range applies only to samples taken after fasting for at least 8 hours.  Magnesium     Status: None   Collection Time: 02/28/20  6:28 PM  Result Value Ref Range   Magnesium 1.9 1.7 - 2.4 mg/dL    Comment: Performed at [facility], [place] [address], [place], [place] [address]  Phosphorus     Status: Abnormal   Collection Time: 02/28/20  6:28 PM  Result Value Ref Range   Phosphorus 2.4 (L) 2.5 - 4.6 mg/dL    Comment: Performed at [facility] [address] [place], [place] [address]  Glucose, capillary     Status: Abnormal   Collection Time: 02/28/20  7:40 PM  Result Value Ref Range   Glucose-Capillary 150 (H) 70 - 99 mg/dL    Comment: Glucose reference range  applies only to samples taken after fasting for at least 8 hours.  Glucose, capillary     Status: Abnormal   Collection Time: 02/28/20 11:04 PM  Result Value Ref Range   Glucose-Capillary 162 (H) 70 - 99 mg/dL    Comment: Glucose reference range applies only to samples taken after fasting for at least 8 hours.  Comprehensive metabolic panel     Status: Abnormal   Collection Time: 02/29/20  1:00 AM  Result Value Ref Range   Sodium 149 (H) 135 - 145 mmol/L   Potassium 3.7 3.5 - 5.1 mmol/L   Chloride 109 98 - 111 mmol/L   CO2 29 22 - 32 mmol/L   Glucose, Bld 181 (H) 70 - 99 mg/dL    Comment: Glucose reference range applies only to samples taken after fasting for at least 8 hours.   BUN 36 (H) 6 - 20 mg/dL   Creatinine, Ser 0.80 0.61 - 1.24 mg/dL   Calcium 11.5 (H) 8.9 - 10.3 mg/dL    Total Protein 5.8 (L) 6.5 - 8.1 g/dL   Albumin 2.7 (L) 3.5 - 5.0 g/dL   AST 27 15 - 41 U/L   ALT 34 0 - 44 U/L   Alkaline Phosphatase 72 38 - 126 U/L   Total Bilirubin 1.2 0.3 - 1.2 mg/dL   GFR, Estimated >60 >60 mL/min    Comment: (NOTE) Calculated using the CKD-EPI Creatinine Equation (2021)    Anion gap 11 5 - 15    Comment: Performed at [facility], [place] [address], [place], [place] [address]  Magnesium     Status: None   Collection Time: 02/29/20  1:00 AM  Result Value Ref Range   Magnesium 1.7 1.7 - 2.4 mg/dL    Comment: Performed at [facility], [place] [address], [place], [place] [address]  Phosphorus     Status: Abnormal   Collection Time: 02/29/20  1:00 AM  Result Value Ref Range   Phosphorus 2.4 (L) 2.5 - 4.6 mg/dL    Comment: Performed at [facility] [address], [place], [place] [address]  CBC with Differential/Platelet     Status: Abnormal   Collection Time: 02/29/20  1:00 AM  Result Value Ref Range   WBC 17.3 (H) 4.0 - 10.5 K/uL   RBC 6.67 (H) 4.22 - 5.81 MIL/uL   Hemoglobin 18.2 (H) 13.0 - 17.0 g/dL   HCT 57.9 (H) 39 - 52 %   MCV 86.8 80.0 - 100.0 fL   MCH 27.3 26.0 - 34.0 pg   MCHC 31.4 30.0 - 36.0 g/dL   RDW 21.9 (H) 11.5 - 15.5 %   Platelets 225 150 - 400 K/uL   nRBC 0.0 0.0 - 0.2 %   Neutrophils Relative % 91 %   Neutro Abs 15.8 (H) 1.7 - 7.7 K/uL   Lymphocytes Relative 3 %   Lymphs Abs 0.5 (L) 0.7 - 4.0 K/uL   Monocytes Relative 6 %   Monocytes Absolute 1.0 0.1 - 1.0 K/uL   Eosinophils Relative 0 %   Eosinophils Absolute 0.0 0.0 - 0.5 K/uL   Basophils Relative 0 %   Basophils Absolute 0.0 0.0 - 0.1 K/uL   Immature Granulocytes 0 %   Abs Immature Granulocytes 0.07 0.00 - 0.07 K/uL    Comment: Performed at [facility], [place] [address] [place], [place] [address]  Phenytoin level, total     Status: None   Collection Time: 02/29/20  1:00 AM  Result Value Ref Range   Phenytoin Lvl 19.5 10.0 - 20.0 ug/mL    Comment:  Performed at [facility], [place] [address], [place], [place] [address]  Glucose, capillary     Status: Abnormal   Collection Time: 02/29/20  3:09 AM  Result Value Ref Range   Glucose-Capillary 124 (H) 70 - 99 mg/dL    Comment: Glucose reference range applies only to samples taken after fasting for at least 8 hours.  Ammonia     Status: Abnormal   Collection Time: 02/29/20  7:48 AM  Result Value Ref Range   Ammonia 43 (H) 9 - 35 umol/L    Comment: Performed at [facility], [place] [address] [place], [place] [address]  Glucose, capillary     Status: Abnormal   Collection Time: 02/29/20  8:53 AM  Result Value Ref Range   Glucose-Capillary 130 (H) 70 - 99 mg/dL    Comment: Glucose reference range applies only to samples taken after fasting for at least 8 hours.    Recent Results (from the past 240 hour(s))  Culture, blood (Routine X 2) w Reflex to ID Panel     Status: None (Preliminary result)   Collection Time: 02/26/20  4:37 PM   Specimen: BLOOD LEFT HAND  Result Value Ref Range Status   Specimen Description BLOOD LEFT HAND  Final   Special Requests   Final    BOTTLES DRAWN AEROBIC ONLY Blood Culture results may not be optimal due to an inadequate volume of blood received in culture bottles   Culture   Final    NO GROWTH 2 DAYS Performed at [facility], [place] [address], [place], [place] [address]    Report Status PENDING  Incomplete  Culture, blood (Routine X 2) w Reflex to ID Panel     Status: None (Preliminary result)   Collection Time: 02/26/20  4:37 PM   Specimen: BLOOD LEFT HAND  Result Value Ref Range Status   Specimen Description BLOOD LEFT HAND  Final   Special Requests   Final    BOTTLES DRAWN AEROBIC ONLY Blood Culture results may not be optimal due to an inadequate volume of blood received in culture bottles   Culture   Final    NO GROWTH 2 DAYS Performed at [facility], [place] [address], [place], [place] [address]    Report Status PENDING   Incomplete  MRSA PCR Screening     Status: None   Collection Time: 02/27/20  6:03 AM   Specimen: Nasal Mucosa; Nasopharyngeal  Result Value Ref Range Status   MRSA by PCR NEGATIVE NEGATIVE Final    Comment:        The GeneXpert MRSA Assay (FDA approved for NASAL specimens only), is one component of a comprehensive MRSA colonization surveillance program. It is not intended to diagnose MRSA infection nor to guide or monitor treatment for MRSA infections. Performed at [facility], [place] [address] [place], [place] [address]     Lipid Panel Recent Labs    02/27/20 1038  CHOL 118  TRIG 110  HDL 39*  CHOLHDL 3.0  VLDL 22  LDLCALC 57    Studies/Results: DG Chest Port 1 View  Result Date: 02/29/2020 CLINICAL DATA:  Hypoxia EXAM: PORTABLE CHEST 1 VIEW COMPARISON:  February 27, 2020 FINDINGS: Endotracheal tube tip is 4.5 cm above the carina. Nasogastric tube tip and side port below the diaphragm. No pneumothorax. There is  postoperative change with scarring and volume loss right upper lobe. Postoperative change also noted in the medial right base region with volume loss. There is a subtle nodular opacity in the left upper lobe measuring 1.1 x 1.0 cm. Lungs elsewhere are clear. Heart size and pulmonary vascular normal. Patient is status post median sternotomy. There is aortic atherosclerosis. No bone lesions. IMPRESSION: Tube positions as described without pneumothorax. Postoperative changes on the right with areas of scarring and volume loss. 1.1 x 1.0 cm nodular opacity left upper lobe which warrants noncontrast enhanced chest CT when patient is clinically able. No edema or consolidation.  Heart size normal. Aortic Atherosclerosis (ICD10-I70.0). These results will be called to the ordering clinician or representative by the Radiologist Assistant, and communication documented in the PACS or [employer]. Electronically Signed   By: Lowella Grip III M.D.   On: 02/29/2020 07:57    Overnight EEG with video  Result Date: 02/28/2020 Lora Havens, MD     02/28/2020 10:23 AM Patient Name: KILLIAN SCHWER MRN: [id number] Epilepsy Attending: Lora Havens Referring Physician/Provider: Dr Donnetta Simpers Duration:  02/27/2020 0603 to 02/28/2020 0603  Patient history: 59yo M with left sided twitching.  EEG to evaluate for seizure.  Level of alertness:  comatose  AEDs during EEG study: Keppra, phenytoin, lacosamide, versed  Technical aspects: This EEG study was done with scalp electrodes positioned according to the 10-20 International system of electrode placement. Electrical activity was acquired at a sampling rate of '500Hz'  and reviewed with a high frequency filter of '70Hz'  and a low frequency filter of '1Hz' . EEG data were recorded continuously and digitally stored.  Description: EEG initially showed  continuous generalized 3 to 5 Hz theta delta slowing with overriding 15 to 18 Hz generalized beta activity.  Sharp waves were seen in right frontocentral region at times periodic at 0.25 to  0.5 Hz.  Event button was pressed on 02/27/2020 at 0733 and 1505 for left arm rhythmic twitching which was difficult to visualize on video.  Concomitant EEG showed  increased frequency of sharp waves in her right frontocentral region to 1 Hz. Patient was then started on IV Versed after which EEG showed generalized predominantly 6-'9Hz'  theta-alpha activity and 15-'18Hz'  beta activity lasting 4-6 seconds alternating with 2-4 seconds of eeg suppression. Frequency of sharp waves reduced after versed. ABNORMALITY -Focal motor seizure, right frontocentral region. - Sharp waves, right frontocentral region - Continuous slow, generalized  IMPRESSION: This study  showed two seizures on 02/27/2020 at 0733 and 1505 arising from right frontocentral region during which patient was noted to have left arm twitching.  Patient was then started on IV Versed drip after which EEG was suggestive of severe diffuse  encephalopathy, likely secondary to sedation.  The frequency of sharp waves also improved after starting IV Versed and were not  periodic in nature.   Priyanka [name]    Medications:  Scheduled:  chlorhexidine gluconate (MEDLINE KIT)  15 mL Mouth Rinse BID   Chlorhexidine Gluconate Cloth  6 each Topical Daily   famotidine  20 mg Per Tube BID   feeding supplement (PROSource TF)  45 mL Per Tube TID   influenza vac split quadrivalent PF  0.5 mL Intramuscular Tomorrow-1000   insulin aspart  0-6 Units Subcutaneous Q4H   mouth rinse  15 mL Mouth Rinse 10 times per day   methylPREDNISolone (SOLU-MEDROL) injection  60 mg Intravenous Q24H   metoprolol tartrate  5 mg Intravenous Q6H   phenytoin (DILANTIN)  IV  100 mg Intravenous Q8H   pneumococcal 23 valent vaccine  0.5 mL Intramuscular Tomorrow-1000   Continuous:  sodium chloride Stopped (02/28/20 0203)   sodium chloride     dextrose 5 % and 0.45% NaCl Stopped (02/28/20 0920)   feeding supplement (OSMOLITE 1.5 CAL) 50 mL/hr at 02/28/20 1600   lacosamide (VIMPAT) IV 200 mg (02/29/20 0916)   levETIRAcetam 1,000 mg (02/29/20 0116)   midazolam 10 mg/hr (02/29/20 0900)   norepinephrine (LEVOPHED) Adult infusion Stopped (02/27/20 1904)   LTM EEG report for this AM: ABNORMALITY: Continuous slow, generalized IMPRESSION: This studyis suggestive of severe diffuse encephalopathy, likely secondary to sedation. No seizures or definite epileptiform discharges were seen during the study.  Assessment/Recommendations:59 y.o.malewith history of CAD, COPD,opioid dependencyand tobacco usead[facility] with [facility], hypoxia, hypotension and was intubated (extubated 02/17/20). 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   CT head -11/11 -Stable subarachnoid hemorrhage in the right anterior mid parietal lobe regions.  CTA head and neck: Unchanged small volume subarachnoid hemorrhage over the right convexity and at the right frontal pole. No intracranial arterial occlusion or high-grade stenosis. 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  MRI headwith and without contrast:Small amount of subarachnoid hemorrhage over the right frontal pole and superior right parietal lobe. Areas of leptomeningeal contrast enhancement in the leftcerebellum and within the superior cerebral hemispheres, favored to be due to late subacute to chronic ischemia. Advanced atrophy and chronic microvascular disease. 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  2D Echo- [id number] to 50%. No cardiac source of emboli identified.  VTE prophylaxis -SCDs  Aspirin 81 mg daily and clopidogrel 75 mg dailyprior to admission, now on No antithrombotic.  Code status -Full code  Other Stroke Risk Factors: Advanced age, Hx stroke/TIA- right parietal old infarct on imaging, Coronary artery diseaseon DAPT PTA, Hx of substanceabuse   Seizure   11/10 episode of right sided shaking followed by right sided hemiplegia.11/12 overnight more episode of right sided tonic clonic activity, and more altered.EEG - 11/14showed continuous slow, generalized.No seizures or epileptiform discharges were seen throughout the recording. Was loaded with keppra and keppra 569m Q8h->10099mQ8h. Vimpat 20065midalso started.  Overnight Sun-Mon 11/15 the patient exhibited intermittent LUE twitching concerning for seizure recurrence. Reintubated overnight (11/15) for airway protection due to seizure activity concerning for possible status epilepticus, with associated obtundation and unresponsiveness between bouts of seizure activity. Of note, the seizure acitivity resolved after Versed for intubation. LTM EEG was started. Versed had been stopped and on Monday 11/15 the patienthad a second recurrence of seizure like activitywith downward gaze and left arm twitchingat about 7:50 AM.Per Dr. YadHortense RamalEG revealed right sided epileptiform discharges. Fosphenytoin load was ordered and Dilantin scheduled dosing started at 100 mg TID.  Now on Keppra, Vimpat and Dilantin, as well as Versed gtt at 10 mg/hr.  Dilantin level Monday evening was therapeutic at 17.8  PLEDs now resolved on LTM EEG per Dr. YadHortense Ramal Continuous EEG off  sedation today (11/17) shows no seizure activity in the setting of diffuse slowing. If EEG neg for seizure activity off sedation tomorrow as well, can probably stop LTM EEG tomorrow.  Neuro exam has not improved off sedation, which is discouraging for his long term recovery.   Tachyarrhythmia   Noted SVT vs. Afib RVR in [facility]. Was not anAC candidate for now given substance use, noncompliance and current SAH  Will need follow up with neurology as outpatientto see if he is acandidate later  Hypercarbic respiratory failure Sepsis with leukocytosis COPD exacerbation  Reintubated overnight on 11/15  On solumedrol  Managementand blood culture follow upperCCM  Cardiomyopathy; rule out endocarditis  EF 55-60% in 05/2019  EF 45-50% this admission  Given SAH and leukocytosis -Repeat B Cxwas ordered 11/14  If B Cx positive, will need TEE to rule out endocarditis  CODE STATUS-pt is now DNR. NP spoke with Stanton Kidney, NP of palliative who stated that pt's S.O. is leaning towards comfort care  once neuro completes EEGs and signs off.   Clance Boll, NP/Neuro NP participated in the care of this patient today.   Electronically signed: Dr. Kerney Elbe  A total of 35 minutes was spent in the neurological evaluation and management of this critically ill patient.    LOS: 15 days    02/29/2020  9:28 AM

## 2020-02-29 NOTE — Progress Notes (Signed)
vEEG LTM maint complete. No skin breakdown at C4 F4 F8. Continue to monitor

## 2020-02-29 NOTE — Progress Notes (Signed)
NAME:  Antonio Ramirez, MRN:  [id number], DOB:  July 11, 1960, LOS: 22 ADMISSION DATE:  02/14/2020, CONSULTATION DATE:  02/27/20 REFERRING MD: Marlowe Sax CHIEF COMPLAINT:  Acute encephalopathy, seizures, status epilepticus  Brief History   59 year old man with COPD, hx of Drug abuse, admitted with hypercarbic respiratory failure, intubated in the ER11/2, not responsive to narcan.     History of present illness    59 y.o. male with medical history significant for opiate dependence, COPD, carotid artery disease, depression.  Initially admitted with AMS on 11/2.  No respiratory symptoms, but hypoxic to 70s at the time of admission on RA.  No known abuse of medications.  Took oxycodone per schedule pta.   Received 2 doses of Moderna Vaccine- April/May. He was intubated in the ED for acute hypoxic and hypercarbic respiratory failure. Extubated 11/5.  Remained sleepy 11/6-11/7, intermittent agitation 11/8 Increasingly alert 11/9 after bipap.  11/10 per cards note not following commands, jerking on R side.  Started on Keppra bolus and then 500 mg TID.  11/10 non responsive per neuro note. 11/11 still lethargic.  11/12persistent seizures, sent to [facility] for cont EEG.  11/13 transferred from [facility], needed increased O2 (16L NRB).  Withdrawing to stimuti. Persistent seizures, babbling incoherently.  11/14 am - able to say name, but + dysarthria. Awake but incoherent, not following commands, withdrawing to stimuli.  Completed 24 hr EEG. "IMPRESSION: This study is suggestive of mild to moderate diffuse encephalopathy, nonspecific etiology.No seizures or epileptiform discharges were seen throughout the recording." Antiplt agents held.   11/15: developed status epilepticus, no improvement with ativan 1mg  x 3 (?), completely obtunded and non responsive between bouts of seizure activity. Non responsive to any stimuli on my exam.  Intubated for airway protection.   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 No evidence of interval hemorrhage. No abnormal mass effect or midline shift. Remote right parietotemporal infarct again noted.  EEG 11/16 > two seizures from right frontocentral region.  Micro Data:  Urine 11/2  > neg  BC x 2   11/2 >>>staph epidermidis BC x 2  11/4 >>> MRSA PCR  11/3  Neg   Antimicrobials:  maxepime  11/2 x1 Flagyl  11/2 -11/3 Vanc  11/2x1 Ancef 11/3 >>>11/7  Interim history/subjective:  Remains intubated.  2 seizures noted on EEG 11/16 > started on versed gtt by neurology for burst suppression.  Objective   Blood pressure (!) 172/122, pulse 100, temperature 98.4 F (36.9 C), temperature source Axillary, resp. rate (!) 30, height 6' (1.829 m), weight 77.5 kg, SpO2 97 %.    Vent Mode: PRVC FiO2 (%):  [40 %] 40 % Set Rate:  [16 bmp] 16 bmp Vt Set:  [361 mL] 620 mL PEEP:  [  [place] Pressure:  [13 BTY60-60 cmH20] 18 cmH20   Intake/Output Summary (Last 24 hours) at 02/29/2020 0914 Last data filed at 02/29/2020 0800 Gross per 24 hour  Intake 1760.79 ml  Output 750 ml  Net 1010.79 ml   Filed Weights   02/26/20 1229 02/27/20 0500 02/29/20 0428  Weight: 77 kg 77 kg 77.5 kg    Examination: General: Disheveled male who is currently sedated HEENT: Poor  dentition multiple missing teeth, ETT In place Neuro: Sedated, not responsive CV: RRR PULM: Coarse rhonchi GI: soft, bsx4 active  GU: Amber urine Extremities: warm/dry,  edema  Skin: no rashes or lesions   Assessment & Plan:   Critically ill due to status epilepticus.  Remains encephalopathic dye to post-ictal/interictal state Continue AED's and burst suppression per neuro. EEG's per neuro.  Critically ill due to acute acute hypoxemic hypercarbic respiratory failure Acute exacerbation of severe COPD. Improving with steroids and prolonged expiration no longer present. Continue vent support No weaning until sedation needs decrease, this will be limiting factor here Continue steroids, BD's Follow CXR  Cor pulmonale by Echo this admit with neg perfusion scan ruling out CTEPH.  [place] WHO group III so rx is treat the underlying condition  (COPD) and make sure 02 sats stay > 90% at all times. Supportive care  PAF - currently in NSR Currently on beta-blocker  LUL nodule CT chest once stabilized and over acute issues  Best practice:  Diet: Tube feedings Pain/Anxiety/Delirium protocol (if indicated): Fentanyl / midazolam VAP protocol (if indicated): bundle in place.  DVT prophylaxis:  lovenox GI prophylaxis: pepcid Glucose control: ISS prn Mobility: bedrest Code Status: Full Code  Family Communication: HCPOA [name] Hill updated 02/28/20, Made a  DNR per her request. States he's been declining since 2014. Disposition: ICU   CC time: 30 min.   Montey Hora, [facility] 02/29/2020, 9:27 AM

## 2020-03-01 DIAGNOSIS — I609 Nontraumatic subarachnoid hemorrhage, unspecified: Secondary | ICD-10-CM | POA: Diagnosis not present

## 2020-03-01 DIAGNOSIS — J9602 Acute respiratory failure with hypercapnia: Secondary | ICD-10-CM | POA: Diagnosis not present

## 2020-03-01 DIAGNOSIS — R4182 Altered mental status, unspecified: Secondary | ICD-10-CM | POA: Diagnosis not present

## 2020-03-01 DIAGNOSIS — J9601 Acute respiratory failure with hypoxia: Secondary | ICD-10-CM | POA: Diagnosis not present

## 2020-03-01 DIAGNOSIS — R569 Unspecified convulsions: Secondary | ICD-10-CM | POA: Diagnosis not present

## 2020-03-01 DIAGNOSIS — Z66 Do not resuscitate: Secondary | ICD-10-CM | POA: Diagnosis not present

## 2020-03-01 DIAGNOSIS — Z515 Encounter for palliative care: Secondary | ICD-10-CM | POA: Diagnosis not present

## 2020-03-01 DIAGNOSIS — Z978 Presence of other specified devices: Secondary | ICD-10-CM | POA: Diagnosis not present

## 2020-03-01 DIAGNOSIS — J441 Chronic obstructive pulmonary disease with (acute) exacerbation: Secondary | ICD-10-CM | POA: Diagnosis not present

## 2020-03-01 LAB — PHOSPHORUS: Phosphorus: 2.1 mg/dL — ABNORMAL LOW (ref 2.5–4.6)

## 2020-03-01 LAB — CBC WITH DIFFERENTIAL/PLATELET
Abs Immature Granulocytes: 0.06 10*3/uL (ref 0.00–0.07)
Basophils Absolute: 0 10*3/uL (ref 0.0–0.1)
Basophils Relative: 0 %
Eosinophils Absolute: 0 10*3/uL (ref 0.0–0.5)
Eosinophils Relative: 0 %
HCT: 54.2 % — ABNORMAL HIGH (ref 39.0–52.0)
Hemoglobin: 16.7 g/dL (ref 13.0–17.0)
Immature Granulocytes: 0 %
Lymphocytes Relative: 7 %
Lymphs Abs: 1 10*3/uL (ref 0.7–4.0)
MCH: 26.5 pg (ref 26.0–34.0)
MCHC: 30.8 g/dL (ref 30.0–36.0)
MCV: 86 fL (ref 80.0–100.0)
Monocytes Absolute: 1.3 10*3/uL — ABNORMAL HIGH (ref 0.1–1.0)
Monocytes Relative: 9 %
Neutro Abs: 12.2 10*3/uL — ABNORMAL HIGH (ref 1.7–7.7)
Neutrophils Relative %: 84 %
Platelets: 194 10*3/uL (ref 150–400)
RBC: 6.3 MIL/uL — ABNORMAL HIGH (ref 4.22–5.81)
RDW: 21.8 % — ABNORMAL HIGH (ref 11.5–15.5)
WBC: 14.5 10*3/uL — ABNORMAL HIGH (ref 4.0–10.5)
nRBC: 0 % (ref 0.0–0.2)

## 2020-03-01 LAB — COMPREHENSIVE METABOLIC PANEL
ALT: 28 U/L (ref 0–44)
AST: 21 U/L (ref 15–41)
Albumin: 2.6 g/dL — ABNORMAL LOW (ref 3.5–5.0)
Alkaline Phosphatase: 67 U/L (ref 38–126)
Anion gap: 8 (ref 5–15)
BUN: 34 mg/dL — ABNORMAL HIGH (ref 6–20)
CO2: 32 mmol/L (ref 22–32)
Calcium: 11.6 mg/dL — ABNORMAL HIGH (ref 8.9–10.3)
Chloride: 110 mmol/L (ref 98–111)
Creatinine, Ser: 0.81 mg/dL (ref 0.61–1.24)
GFR, Estimated: >60 mL/min (ref >60)
Glucose, Bld: 123 mg/dL — ABNORMAL HIGH (ref 70–99)
Potassium: 3.5 mmol/L (ref 3.5–5.1)
Sodium: 150 mmol/L — ABNORMAL HIGH (ref 135–145)
Total Bilirubin: 0.9 mg/dL (ref 0.3–1.2)
Total Protein: 5.8 g/dL — ABNORMAL LOW (ref 6.5–8.1)

## 2020-03-01 LAB — PHENYTOIN LEVEL, TOTAL: Phenytoin Lvl: 19.1 ug/mL (ref 10.0–20.0)

## 2020-03-01 LAB — MAGNESIUM: Magnesium: 1.9 mg/dL (ref 1.7–2.4)

## 2020-03-01 LAB — GLUCOSE, CAPILLARY
Glucose-Capillary: 125 mg/dL — ABNORMAL HIGH (ref 70–99)
Glucose-Capillary: 110 mg/dL — ABNORMAL HIGH (ref 70–99)
Glucose-Capillary: 136 mg/dL — ABNORMAL HIGH (ref 70–99)
Glucose-Capillary: 163 mg/dL — ABNORMAL HIGH (ref 70–99)
Glucose-Capillary: 141 mg/dL — ABNORMAL HIGH (ref 70–99)
Glucose-Capillary: 120 mg/dL — ABNORMAL HIGH (ref 70–99)

## 2020-03-01 MED ORDER — FREE WATER
200.0000 mL | Freq: Three times a day (TID) | Status: DC
  Administered 2020-03-01 – 2020-03-06 (×15): 200 mL

## 2020-03-01 MED ORDER — LEVETIRACETAM 100 MG/ML PO SOLN
1500.0000 mg | Freq: Two times a day (BID) | ORAL | Status: DC
  Administered 2020-03-01 – 2020-03-06 (×10): 1500 mg
  Filled 2020-03-01 (×10): qty 15

## 2020-03-01 MED ORDER — POTASSIUM PHOSPHATES 15 MMOLE/5ML IV SOLN
30.0000 mmol | Freq: Once | INTRAVENOUS | Status: AC
  Administered 2020-03-01: 30 mmol via INTRAVENOUS
  Filled 2020-03-01: qty 10

## 2020-03-01 MED ORDER — LACOSAMIDE 50 MG PO TABS
200.0000 mg | ORAL_TABLET | Freq: Two times a day (BID) | ORAL | Status: DC
  Administered 2020-03-01 – 2020-03-06 (×11): 200 mg
  Filled 2020-03-01 (×11): qty 4

## 2020-03-01 NOTE — Progress Notes (Signed)
Subjective: Pt unable to participate due to altered mental status. Per RN, his sedation has remained off since 11/17 at 0926. Per RN, he has been spontaneously opening his eyes. Still non verbal and not following commands.   Objective: Current vital signs: BP 117/77 (BP Location: Right Arm)   Pulse 94   Temp 99 F (37.2 C) (Axillary)   Resp 16   Ht 6' (1.829 m)   Wt 77.5 kg   SpO2 96%   BMI 23.17 kg/m  Vital signs in last 24 hours: Temp:  [97.9 F (36.6 C)-99.3 F (37.4 C)] 99 F (37.2 C) (11/18 0800) Pulse Rate:  [80-109] 94 (11/18 0800) Resp:  [16-31] 16 (11/18 0800) BP: (97-172)/(64-122) 117/77 (11/18 0800) SpO2:  [91 %-99 %] 96 % (11/18 0800) FiO2 (%):  [40 %] 40 % (11/18 0740)  Intake/Output from previous day: 11/17 0701 - 11/18 0700 In: 1638.3 [I.V.:143.8; NG/GT:1204.2; IV Piggyback:290.3] Out: 1340 [Urine:1340] Intake/Output this shift: Total I/O In: 50 [NG/GT:50] Out: -  Nutritional status:  Diet Order            Diet NPO time specified  Diet effective now                 Neurologic Exam: GCS 7t:   Best eye response +2, opens eyes to pain           Best verbal response +1 non verbal, but intubated,            Best motor response +4 withdraws to pain, does not follow commands.  CNs II-PERRL, 21m. Blinks to threat.  III, IV, VI-eyes midline. No tracking. No nystagmus. Negative Doll's eyes.  V, VII-grimaces when bilateral face is stroked, + corneal reflex- closes eyes when cornea touched VIII-does not open eyes to name calling. Grimaces with loud clapping. IX,X-deferred due to intubation.  XI-head is midline.  XII-deferred due to intubation Motor/Sensory-Flexion of UE extremities with noxious stimuli. Dorsi flexion of LEs to noxious stimuli but does not move knees. His LUE with increased tone and spastic when attempted to be moved. His RUE is mostly flaccid. BLE with decreased tone.  Reflexes-His toes are upgoing to Babinski and stimulation of feet. DTRs  UEs 2+. DTRs 2+ RLE 1+ LLE  Cerebellar: Unable to assess Gait: Unable to assess  Lab Results: Results for orders placed or performed during the hospital encounter of 02/14/20 (from the past 48 hour(s))  Glucose, capillary     Status: Abnormal   Collection Time: 02/28/20 11:41 AM  Result Value Ref Range   Glucose-Capillary 147 (H) 70 - 99 mg/dL    Comment: Glucose reference range applies only to samples taken after fasting for at least 8 hours.  Glucose, capillary     Status: Abnormal   Collection Time: 02/28/20  3:49 PM  Result Value Ref Range   Glucose-Capillary 122 (H) 70 - 99 mg/dL    Comment: Glucose reference range applies only to samples taken after fasting for at least 8 hours.  Magnesium     Status: None   Collection Time: 02/28/20  6:28 PM  Result Value Ref Range   Magnesium 1.9 1.7 - 2.4 mg/dL    Comment: Performed at [facility] [address] [place] [address] Phosphorus     Status: Abnormal   Collection Time: 02/28/20  6:28 PM  Result Value Ref Range   Phosphorus 2.4 (L) 2.5 - 4.6 mg/dL    Comment: Performed at [facility]  [address], [place], [place] [address]  Glucose, capillary     Status: Abnormal   Collection Time: 02/28/20  7:40 PM  Result Value Ref Range   Glucose-Capillary 150 (H) 70 - 99 mg/dL    Comment: Glucose reference range applies only to samples taken after fasting for at least 8 hours.  Glucose, capillary     Status: Abnormal   Collection Time: 02/28/20 11:04 PM  Result Value Ref Range   Glucose-Capillary 162 (H) 70 - 99 mg/dL    Comment: Glucose reference range applies only to samples taken after fasting for at least 8 hours.  Comprehensive metabolic panel     Status: Abnormal   Collection Time: 02/29/20  1:00 AM  Result Value Ref Range   Sodium 149 (H) 135 - 145 mmol/L   Potassium 3.7 3.5 - 5.1 mmol/L   Chloride 109 98 - 111 mmol/L   CO2 29 22 - 32 mmol/L   Glucose, Bld 181 (H) 70 - 99 mg/dL    Comment: Glucose  reference range applies only to samples taken after fasting for at least 8 hours.   BUN 36 (H) 6 - 20 mg/dL   Creatinine, Ser 0.80 0.61 - 1.24 mg/dL   Calcium 11.5 (H) 8.9 - 10.3 mg/dL   Total Protein 5.8 (L) 6.5 - 8.1 g/dL   Albumin 2.7 (L) 3.5 - 5.0 g/dL   AST 27 15 - 41 U/L   ALT 34 0 - 44 U/L   Alkaline Phosphatase 72 38 - 126 U/L   Total Bilirubin 1.2 0.3 - 1.2 mg/dL   GFR, Estimated >60 >60 mL/min    Comment: (NOTE) Calculated using the CKD-EPI Creatinine Equation (2021)    Anion gap 11 5 - 15    Comment: Performed at [facility], [place] [address] [place], [place] [address]  Magnesium     Status: None   Collection Time: 02/29/20  1:00 AM  Result Value Ref Range   Magnesium 1.7 1.7 - 2.4 mg/dL    Comment: Performed at [facility], [place] [address] [place], [place] [address]  Phosphorus     Status: Abnormal   Collection Time: 02/29/20  1:00 AM  Result Value Ref Range   Phosphorus 2.4 (L) 2.5 - 4.6 mg/dL    Comment: Performed at [facility] [address], [place], [place] [address]  CBC with Differential/Platelet     Status: Abnormal   Collection Time: 02/29/20  1:00 AM  Result Value Ref Range   WBC 17.3 (H) 4.0 - 10.5 K/uL   RBC 6.67 (H) 4.22 - 5.81 MIL/uL   Hemoglobin 18.2 (H) 13.0 - 17.0 g/dL   HCT 57.9 (H) 39 - 52 %   MCV 86.8 80.0 - 100.0 fL   MCH 27.3 26.0 - 34.0 pg   MCHC 31.4 30.0 - 36.0 g/dL   RDW 21.9 (H) 11.5 - 15.5 %   Platelets 225 150 - 400 K/uL   nRBC 0.0 0.0 - 0.2 %   Neutrophils Relative % 91 %   Neutro Abs 15.8 (H) 1.7 - 7.7 K/uL   Lymphocytes Relative 3 %   Lymphs Abs 0.5 (L) 0.7 - 4.0 K/uL   Monocytes Relative 6 %   Monocytes Absolute 1.0 0.1 - 1.0 K/uL   Eosinophils Relative 0 %   Eosinophils Absolute 0.0 0.0 - 0.5 K/uL   Basophils Relative 0 %   Basophils Absolute 0.0 0.0 - 0.1 K/uL   Immature Granulocytes  0 %   Abs Immature Granulocytes 0.07 0.00 - 0.07 K/uL    Comment: Performed at [facility], [place] [address] [place], [place] [address]  Phenytoin level, total     Status: None   Collection Time: 02/29/20  1:00 AM  Result Value Ref Range   Phenytoin Lvl 19.5 10.0 - 20.0 ug/mL    Comment: Performed at [facility] [address], [place], [place] [address]  Glucose, capillary     Status: Abnormal   Collection Time: 02/29/20  3:09 AM  Result Value Ref Range   Glucose-Capillary 124 (H) 70 - 99 mg/dL    Comment: Glucose reference range applies only to samples taken after fasting for at least 8 hours.  Ammonia     Status: Abnormal   Collection Time: 02/29/20  7:48 AM  Result Value Ref Range   Ammonia 43 (H) 9 - 35 umol/L    Comment: Performed at [facility], [place] [address] [place], [place] [address]  Glucose, capillary     Status: Abnormal   Collection Time: 02/29/20  8:53 AM  Result Value Ref Range   Glucose-Capillary 130 (H) 70 - 99 mg/dL    Comment: Glucose reference range applies only to samples taken after fasting for at least 8 hours.  Glucose, capillary     Status: Abnormal   Collection Time: 02/29/20 11:37 AM  Result Value Ref Range   Glucose-Capillary 123 (H) 70 - 99 mg/dL    Comment: Glucose reference range applies only to samples taken after fasting for at least 8 hours.  Glucose, capillary     Status: Abnormal   Collection Time: 02/29/20  3:05 PM  Result Value Ref Range   Glucose-Capillary 124 (H) 70 - 99 mg/dL    Comment: Glucose reference range applies only to samples taken after fasting for at least 8 hours.  Glucose, capillary     Status: Abnormal   Collection Time: 02/29/20  7:25 PM  Result Value Ref Range   Glucose-Capillary 131 (H) 70 - 99 mg/dL    Comment: Glucose reference range applies only to samples taken after fasting for at least 8 hours.  Glucose, capillary     Status: Abnormal   Collection Time: 02/29/20 11:08 PM  Result Value Ref Range   Glucose-Capillary 121 (H) 70 - 99 mg/dL    Comment: Glucose reference range applies only to samples taken  after fasting for at least 8 hours.  Glucose, capillary     Status: Abnormal   Collection Time: 03/01/20  3:16 AM  Result Value Ref Range   Glucose-Capillary 125 (H) 70 - 99 mg/dL    Comment: Glucose reference range applies only to samples taken after fasting for at least 8 hours.  Comprehensive metabolic panel     Status: Abnormal   Collection Time: 03/01/20  7:12 AM  Result Value Ref Range   Sodium 150 (H) 135 - 145 mmol/L   Potassium 3.5 3.5 - 5.1 mmol/L   Chloride 110 98 - 111 mmol/L   CO2 32 22 - 32 mmol/L   Glucose, Bld 123 (H) 70 - 99 mg/dL    Comment: Glucose reference range applies only to samples taken after fasting for at least 8 hours.   BUN 34 (H) 6 - 20 mg/dL   Creatinine, Ser 0.81 0.61 - 1.24 mg/dL   Calcium 11.6 (H) 8.9 - 10.3 mg/dL   Total Protein 5.8 (L) 6.5 - 8.1 g/dL   Albumin  2.6 (L) 3.5 - 5.0 g/dL   AST 21 15 - 41 U/L   ALT 28 0 - 44 U/L   Alkaline Phosphatase 67 38 - 126 U/L   Total Bilirubin 0.9 0.3 - 1.2 mg/dL   GFR, Estimated >60 >60 mL/min    Comment: (NOTE) Calculated using the CKD-EPI Creatinine Equation (2021)    Anion gap 8 5 - 15    Comment: Performed at [facility] [address] [place], [place] [address]  Magnesium     Status: None   Collection Time: 03/01/20  7:12 AM  Result Value Ref Range   Magnesium 1.9 1.7 - 2.4 mg/dL    Comment: Performed at [facility] [address], [place], [place] [address]  Phosphorus     Status: Abnormal   Collection Time: 03/01/20  7:12 AM  Result Value Ref Range   Phosphorus 2.1 (L) 2.5 - 4.6 mg/dL    Comment: Performed at [facility] [address], [place], [place] [address]  CBC with Differential/Platelet     Status: Abnormal   Collection Time: 03/01/20  7:12 AM  Result Value Ref Range   WBC 14.5 (H) 4.0 - 10.5 K/uL   RBC 6.30 (H) 4.22 - 5.81 MIL/uL   Hemoglobin 16.7 13.0 - 17.0 g/dL   HCT 54.2 (H) 39 - 52 %   MCV 86.0 80.0 - 100.0 fL   MCH 26.5 26.0 - 34.0 pg   MCHC 30.8  30.0 - 36.0 g/dL   RDW 21.8 (H) 11.5 - 15.5 %   Platelets 194 150 - 400 K/uL   nRBC 0.0 0.0 - 0.2 %   Neutrophils Relative % 84 %   Neutro Abs 12.2 (H) 1.7 - 7.7 K/uL   Lymphocytes Relative 7 %   Lymphs Abs 1.0 0.7 - 4.0 K/uL   Monocytes Relative 9 %   Monocytes Absolute 1.3 (H) 0.1 - 1.0 K/uL   Eosinophils Relative 0 %   Eosinophils Absolute 0.0 0.0 - 0.5 K/uL   Basophils Relative 0 %   Basophils Absolute 0.0 0.0 - 0.1 K/uL   Immature Granulocytes 0 %   Abs Immature Granulocytes 0.06 0.00 - 0.07 K/uL    Comment: Performed at [facility], [place] [address], [place], [place] [address]  Phenytoin level, total     Status: None   Collection Time: 03/01/20  7:12 AM  Result Value Ref Range   Phenytoin Lvl 19.1 10.0 - 20.0 ug/mL    Comment: Performed at [facility] [address] [place], [place] [address]  Glucose, capillary     Status: Abnormal   Collection Time: 03/01/20  8:01 AM  Result Value Ref Range   Glucose-Capillary 110 (H) 70 - 99 mg/dL    Comment: Glucose reference range applies only to samples taken after fasting for at least 8 hours.    Recent Results (from the past 240 hour(s))  Culture, blood (Routine X 2) w Reflex to ID Panel     Status: None (Preliminary result)   Collection Time: 02/26/20  4:37 PM   Specimen: BLOOD LEFT HAND  Result Value Ref Range Status   Specimen Description BLOOD LEFT HAND  Final   Special Requests   Final    BOTTLES DRAWN AEROBIC ONLY Blood Culture results may not be optimal due to an inadequate volume of blood received in culture bottles   Culture   Final    NO GROWTH 3 DAYS Performed  at [facility], [place] [address] [place], [place] [address]    Report Status PENDING  Incomplete  Culture, blood (Routine X 2) w Reflex to ID Panel     Status: None (Preliminary result)   Collection Time: 02/26/20  4:37 PM   Specimen: BLOOD LEFT HAND  Result Value Ref Range Status   Specimen Description BLOOD LEFT HAND  Final   Special  Requests   Final    BOTTLES DRAWN AEROBIC ONLY Blood Culture results may not be optimal due to an inadequate volume of blood received in culture bottles   Culture   Final    NO GROWTH 3 DAYS Performed at [facility], [place] [address], [place], [place] [address]    Report Status PENDING  Incomplete  MRSA PCR Screening     Status: None   Collection Time: 02/27/20  6:03 AM   Specimen: Nasal Mucosa; Nasopharyngeal  Result Value Ref Range Status   MRSA by PCR NEGATIVE NEGATIVE Final    Comment:        The GeneXpert MRSA Assay (FDA approved for NASAL specimens only), is one component of a comprehensive MRSA colonization surveillance program. It is not intended to diagnose MRSA infection nor to guide or monitor treatment for MRSA infections. Performed at [facility], [place] [address], [place], [place] [address]     Lipid Panel Recent Labs    02/27/20 1038  CHOL 118  TRIG 110  HDL 39*  CHOLHDL 3.0  VLDL 22  LDLCALC 57    Studies/Results: DG Chest Port 1 View  Result Date: 02/29/2020 CLINICAL DATA:  Hypoxia EXAM: PORTABLE CHEST 1 VIEW COMPARISON:  February 27, 2020 FINDINGS: Endotracheal tube tip is 4.5 cm above the carina. Nasogastric tube tip and side port below the diaphragm. No pneumothorax. There is postoperative change with scarring and volume loss right upper lobe. Postoperative change also noted in the medial right base region with volume loss. There is a subtle nodular opacity in the left upper lobe measuring 1.1 x 1.0 cm. Lungs elsewhere are clear. Heart size and pulmonary vascular normal. Patient is status post median sternotomy. There is aortic atherosclerosis. No bone lesions. IMPRESSION: Tube positions as described without pneumothorax. Postoperative changes on the right with areas of scarring and volume loss. 1.1 x 1.0 cm nodular opacity left upper lobe which warrants noncontrast enhanced chest CT when patient is clinically able. No edema or consolidation.   Heart size normal. Aortic Atherosclerosis (ICD10-I70.0). These results will be called to the ordering clinician or representative by the Radiologist Assistant, and communication documented in the PACS or [employer]. Electronically Signed   By: Lowella Grip III M.D.   On: 02/29/2020 07:57   Overnight EEG with video  Result Date: 02/28/2020 Lora Havens, MD     02/28/2020 10:23 AM Patient Name: ZAYDENN BALAGUER MRN: [id number] Epilepsy Attending: Lora Havens Referring Physician/Provider: Dr Donnetta Simpers Duration:  02/27/2020 0603 to 02/28/2020 0603  Patient history: 59yo M with left sided twitching.  EEG to evaluate for seizure.  Level of alertness:  comatose  AEDs during EEG study: Keppra, phenytoin, lacosamide, versed  Technical aspects: This EEG study was done with scalp electrodes positioned according to the 10-20 International system of electrode placement. Electrical activity was acquired at a sampling rate of '500Hz'  and reviewed with a high frequency filter of '70Hz'  and a low frequency filter of '1Hz' . EEG data were recorded continuously and digitally stored.  Description: EEG  initially showed  continuous generalized 3 to 5 Hz theta delta slowing with overriding 15 to 18 Hz generalized beta activity.  Sharp waves were seen in right frontocentral region at times periodic at 0.25 to  0.5 Hz.  Event button was pressed on 02/27/2020 at 0733 and 1505 for left arm rhythmic twitching which was difficult to visualize on video.  Concomitant EEG showed  increased frequency of sharp waves in her right frontocentral region to 1 Hz. Patient was then started on IV Versed after which EEG showed generalized predominantly 6-'9Hz'  theta-alpha activity and 15-'18Hz'  beta activity lasting 4-6 seconds alternating with 2-4 seconds of eeg suppression. Frequency of sharp waves reduced after versed. ABNORMALITY -Focal motor seizure, right frontocentral region. - Sharp waves, right frontocentral region -  Continuous slow, generalized  IMPRESSION: This study  showed two seizures on 02/27/2020 at 0733 and 1505 arising from right frontocentral region during which patient was noted to have left arm twitching.  Patient was then started on IV Versed drip after which EEG was suggestive of severe diffuse encephalopathy, likely secondary to sedation.  The frequency of sharp waves also improved after starting IV Versed and were not  periodic in nature.   Priyanka [name]    Medications:  Scheduled: . chlorhexidine gluconate (MEDLINE KIT)  15 mL Mouth Rinse BID  . Chlorhexidine Gluconate Cloth  6 each Topical Daily  . famotidine  20 mg Per Tube BID  . feeding supplement (PROSource TF)  45 mL Per Tube TID  . influenza vac split quadrivalent PF  0.5 mL Intramuscular Tomorrow-1000  . insulin aspart  0-6 Units Subcutaneous Q4H  . mouth rinse  15 mL Mouth Rinse 10 times per day  . metoprolol tartrate  5 mg Intravenous Q6H  . phenytoin (DILANTIN) IV  75 mg Intravenous Q8H  . pneumococcal 23 valent vaccine  0.5 mL Intramuscular Tomorrow-1000   Continuous: . sodium chloride Stopped (02/28/20 0203)  . sodium chloride    . feeding supplement (OSMOLITE 1.5 CAL) 1,000 mL (02/29/20 2350)  . lacosamide (VIMPAT) IV Stopped (02/29/20 2313)  . levETIRAcetam Stopped (02/29/20 2223)  . midazolam Stopped (02/29/20 0175)   Assessment/Recommendations:59 y.o.malewith history of CAD, COPD,opioid dependencyand tobacco usead[facility] with [facility], hypoxia, hypotension and was intubated (extubated 02/17/20). 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   CT head -11/11 -Stable subarachnoid hemorrhage in the right anterior mid parietal lobe regions.  CTA head and neck:Unchanged small volume subarachnoid hemorrhage over the right convexity and at the right frontal pole. No intracranial arterial occlusion or high-grade stenosis. 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  MRI headwith and without contrast:Small amount of subarachnoid hemorrhage over the right frontal pole and superior right parietal lobe. Areas of leptomeningeal contrast enhancement in the leftcerebellum and within the superior cerebral hemispheres, favored to be due to late subacute to chronic ischemia. Advanced atrophy and chronic microvascular disease. 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  2D Echo- [id number] to 50%. No  cardiac source of emboli identified.  VTE prophylaxis -SCDs  Aspirin 81 mg daily and clopidogrel 75 mg dailyprior to admission, now on No antithrombotic.  Code status -Full code  Other Stroke Risk Factors: Advanced age, Hx stroke/TIA- right parietal old infarct on imaging, Coronary artery diseaseon DAPT PTA, Hx of substanceabuse  Seizure   11/10 episode of right sided shaking followed by right sided hemiplegia.11/12 overnight more episode of right sided tonic clonic activity, and more altered.EEG - 11/14showed continuous slow, generalized.No seizures or epileptiform discharges were seen throughout the recording. Was loaded with keppra and keppra 521m Q8h->10098mQ8h. Vimpat 20019midalso started. Overnight Sun-Mon 11/15 the patient exhibited intermittent LUE twitching concerning for seizure recurrence. Reintubated overnight (11/15) for  airway protection due to seizure activity concerning for possible status epilepticus, with associated obtundation and unresponsiveness between bouts of seizure activity. Of note, the seizure acitivity resolved after Versed for intubation. LTM EEG was started. Versed had been stopped and on Monday 11/15 thepatienthad a second recurrence of seizure like activitywith downward gaze and left arm twitchingat about 7:50 AM.Per Dr. YadHortense RamalEG revealed right sided epileptiform discharges. Fosphenytoin load was ordered and Dilantinscheduled dosing started at100 mg TID.  Now on Keppra, Vimpat and Dilantin. Dilantin level was therapeutic at 19.1 on 11/18.  Continuous EEG off sedation for 24 hours shows generalized continuous slowing without seizures or definite epileptiform discharges. The studyissuggestive of moderate to severe diffuse encephalopathy. EEG appears to be improving compared to previous day.  Discontinuing LTM EEG  Neuro exam has improved off sedation today.   CODE STATUS-pt is now DNR.    Pt seen by KarClance BollP and Dr. LinCheral Markerttending's exam was observed and documented by the NP.    LOS: 16 days   '@Electronically'  signed: Dr. EriKerney Elbe5 minutes spent in the neurological evaluation and management of this critically ill patient  03/01/2020  8:59 AM

## 2020-03-01 NOTE — Progress Notes (Signed)
Patient ID: Antonio Ramirez, male   DOB: 12-05-1960, 59 y.o.   MRN: [id number]  59 year old man who remains critically ill due to status epilepticus requiring mechanical ventilation for airway protection on a background history of severe COPD with cor pulmonale.  This NP reviewed medical records, received report from team and then visited patient at the bedside  for palliative needs and emotional support.  Patient remains intubated.   Patient is off sedation, hopefully allowing the patient to wake up and possibly transition to SBT and extubation.    Discussed with Dr Lynetta Mare, plan is to continue to monitor through the week-end allowing sedation to clear hopefully giving patient the best opportunity to successful wean from ventilator.   Spoke to SO/Antonio Ramirez/HPOA by telephone  Continued conversation regarding current medical situation; diagnosis, prognosis, goals of care, end-of-life wishes, disposition and options.  Antonio Ramirez clearly speaks to quality of life for this patient.  She "knows" that he would never want to live on machines".  She appreciates daily updates and that it is very difficult for her to come here to the hospital.  Emotional support offered.  She verbalizes understanding that depending on how patient does through the week-end/outcomes  will impact decisions related to next steps in transition of care for patient.     Education offered on the difference between an aggressive medical intervention path and a palliative comfort path for this patient, in his current medical situation.  F/U meeting is set for Tuesday at 1200  Discussed with SO/Antonio Ramirez the importance of continued conversation with all family and the medical providers regarding overall plan of care and treatment options,  ensuring decisions are within the context of the patients values and GOCs.  Decisions will be made along the way depending on patient outcomes.   PMT will continue to support holistically  Questions and  concerns addressed   Discussed with Clance Boll NP and Dr Lynetta Mare  Total time spent on the unit was 35 minutes  Greater than 50% of the time was spent in counseling and coordination of care  Wadie Lessen NP  Palliative Medicine Team Team Phone # [phone] Pager [phone]

## 2020-03-01 NOTE — Progress Notes (Signed)
vLTM EEG complete. No skin breakdown 

## 2020-03-01 NOTE — Progress Notes (Signed)
NAME:  Antonio Ramirez, MRN:  [id number], DOB:  1961/03/04, LOS: 3 ADMISSION DATE:  02/14/2020, CONSULTATION DATE:  02/27/20 REFERRING MD: Marlowe Sax CHIEF COMPLAINT:  Acute encephalopathy, seizures, status epilepticus  Brief History   59 year old man with COPD, hx of Drug abuse, admitted with hypercarbic respiratory failure, intubated in the ER11/2, not responsive to narcan.     History of present illness    59 y.o. male with medical history significant for opiate dependence, COPD, carotid artery disease, depression.  Initially admitted with AMS on 11/2.  No respiratory symptoms, but hypoxic to 70s at the time of admission on RA.  No known abuse of medications.  Took oxycodone per schedule pta.   Received 2 doses of Moderna Vaccine- April/May. He was intubated in the ED for acute hypoxic and hypercarbic respiratory failure. Extubated 11/5.  Remained sleepy 11/6-11/7, intermittent agitation 11/8 Increasingly alert 11/9 after bipap.  11/10 per cards note not following commands, jerking on R side.  Started on Keppra bolus and then 500 mg TID.  11/10 non responsive per neuro note. 11/11 still lethargic.  11/12persistent seizures, sent to [facility] for cont EEG.  11/13 transferred from [facility], needed increased O2 (16L NRB).  Withdrawing to stimuti. Persistent seizures, babbling incoherently.  11/14 am - able to say name, but + dysarthria. Awake but incoherent, not following commands, withdrawing to stimuli.  Completed 24 hr EEG. "IMPRESSION: This study is suggestive of mild to moderate diffuse encephalopathy, nonspecific etiology.No seizures or epileptiform discharges were seen throughout the recording." Antiplt agents held.   11/15: developed status epilepticus, no improvement with ativan 1mg  x 3 (?), completely obtunded and non responsive between bouts of seizure activity. Non responsive to any stimuli on my exam.  Intubated for airway protection.   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 No evidence of interval hemorrhage. No abnormal mass effect or midline shift. Remote right parietotemporal infarct again noted.  EEG 11/16 > two seizures from right frontocentral region. EEG 11/18 > mod to severe diffuse encephalopathy.  Micro Data:  Urine 11/2  > neg  BC x 2   11/2 >>>staph epidermidis BC x 2  11/4 >>> MRSA PCR  11/3  Neg   Antimicrobials:  maxepime  11/2 x1 Flagyl  11/2 -11/3 Vanc  11/2x1 Ancef 11/3 >>>11/7  Interim history/subjective:  Remains intubated and minimally responsive.  Opens eyes but does not follow commands.  No sedation.  Objective   Blood pressure 120/81, pulse 94, temperature 99 F (37.2 C), temperature source Axillary, resp. rate 16, height 6' (1.829 m), weight 77.5 kg, SpO2 97 %.    Vent Mode: PRVC FiO2 (%):  [40 %] 40 % Set Rate:  [16 bmp] 16 bmp Vt Set:  [620 mL] 620  mL PEEP:  [5 [place] Pressure:  [14 cmH20-17 cmH20] 15 cmH20   Intake/Output Summary (Last 24 hours) at 03/01/2020 0915 Last data filed at 03/01/2020 0800 Gross per 24 hour  Intake 1568.94 ml  Output 1340 ml  Net 228.94 ml   Filed Weights   02/26/20 1229 02/27/20 0500 02/29/20 0428  Weight: 77 kg 77 kg 77.5 kg    Examination: General: Disheveled male who is  intubated HEENT: Poor dentition multiple missing teeth, ETT In place Neuro: Opens eyes but does not follow commands CV: RRR PULM: Coarse rhonchi GI: soft, bsx4 active  GU: Amber urine Extremities: warm/dry,  edema  Skin: no rashes or lesions  I/O net -4.1.  Assessment & Plan:   Critically ill due to status epilepticus - now resolved, no further seizures noted on EEG. D/c versed per neuro. EEG's per neuro (currently on keppra, vimpat, phenytoin).  Critically ill due to acute acute hypoxemic hypercarbic respiratory failure. Acute exacerbation of severe COPD. Improving with steroids and prolonged expiration no longer present. Continue vent support, attempted SBT earlier but failed due to apnea. Continue SBT as mental status allows. Continue BD's. Follow CXR.  Cor pulmonale by Echo this admit with neg perfusion scan ruling out CTEPH.  [place] WHO group III so rx is treat the underlying condition  (COPD) and make sure 02 sats stay > 90% at all times. Supportive care.  PAF - currently in NSR. Currently on beta-blocker.  LUL nodule. CT chest once stabilized and over acute issues.  Hypernatremia. Free water 200mg  q8hrs. Follow BMP.  Hypophosphatemia. 51mmol K phos.  Best practice:  Diet: Tube feedings. Pain/Anxiety/Delirium protocol (if indicated): None. VAP protocol (if indicated): bundle in place.  DVT prophylaxis:  lovenox. GI prophylaxis: pepcid. Glucose control: ISS prn. Mobility: bedrest. Code Status: Full Code. Family Communication: [place] updated 11/17. Made a  DNR per her request. States he's been declining since 2014. Disposition: ICU.  CC time: 30 min.   Montey Hora, [facility] 03/01/2020, 9:15 AM

## 2020-03-01 NOTE — Procedures (Signed)
Patient Name:Antonio Ramirez:[id number] Epilepsy Attending:Laval Cafaro [name] Referring Physician/Provider:Dr Donnetta Simpers Duration:03/01/2020 0603 to11/19/20211009  Patient SPZZCKI:20TV M with left sided twitching.EEG to evaluate for seizure.  Level of alertness:awake, asleep/sedated  AEDs during EEG study:Keppra, phenytoin, lacosamide  Technical aspects: This EEG study was done with scalp electrodes positioned according to the 10-20 International system of electrode placement. Electrical activity was acquired at a sampling rate of 500Hz  and reviewed with a high frequency filter of 70Hz  and a low frequency filter of 1Hz . EEG data were recorded continuously and digitally stored.   Description:No clear posterior dominant rhythm was seen.  EEG showednearcontinuous generalized polymorphic mixed frequencies with predominantly 6 to 9 Hz theta-alpha activity as well as intermittent 2 to 3 Hz generalized delta activity.   ABNORMALITY -Continuous slow, generalized  IMPRESSION: This studyissuggestive of moderate to severe diffuse encephalopathy, likely secondary to sedation.No seizures or definite epileptiform discharges were seen during the study.  Antonio Ramirez [name]

## 2020-03-01 NOTE — Procedures (Signed)
Patient Name:Antonio Ramirez EZV:[id number] Epilepsy Attending:Reine Bristow [name] Referring Physician/Provider:Dr Donnetta Simpers Duration:02/29/2020 0603 to11/18/20210603  Patient DSWVTVN:49CH M with left sided twitching.EEG to evaluate for seizure.  Level of alertness:awake, asleep/sedated  AEDs during EEG study:Keppra, phenytoin, lacosamide  Technical aspects: This EEG study was done with scalp electrodes positioned according to the 10-20 International system of electrode placement. Electrical activity was acquired at a sampling rate of 500Hz  and reviewed with a high frequency filter of 70Hz  and a low frequency filter of 1Hz . EEG data were recorded continuously and digitally stored.   Description:No clear posterior dominant rhythm was seen.  EEG showednear continuous generalized polymorphic mixed frequencies with predominantly 6 to 9 Hz theta-alpha activity as well as intermittent 2 to 3 Hz generalized delta activity.   ABNORMALITY -Continuous slow, generalized  IMPRESSION: This studyis suggestive of moderate to severe diffuse encephalopathy, likely secondary to sedation. No seizures or definite epileptiform discharges were seen during the study.  EEG appears to be improving compared to previous day.  Aracelly Tencza [name]

## 2020-03-01 NOTE — Evaluation (Signed)
Occupational Therapy Evaluation Patient Details Name: Antonio Ramirez MRN: [id number] DOB: 1960/07/13 Today's Date: 03/01/2020    History of Present Illness pt is a 59 y/o male with PMH significant for opiate dependence, COPD,CAD, CABG, admitted with AMS, found to be hypoxic down into the 70's on RA,  In ED, he was intubated for acute hypoxic and hypercarbic respiratory failue.  Extubated f11/5.  Status deline as of 11/10, decreased responsiveness,11/12 persistent seizures, transferred to [facility] for continuous EEG, 11/13 w/d from stimulit, increasing O2 needs 11/15 developed status epilepticus, intubated for airway protection.   Clinical Impression   Pt admitted with above. He demonstrates the below listed deficits and will benefit from continued OT to maximize safety and independence with BADLs.  Pt seen in conjunction with PT.  He was noted to move bil. Hands minimally, but followed no commands.  He sat EOB x ~5 mins with total A - did not initiate any righting responses.   He currently requires total A for ADLs.   Pt unable to provide info re: PLOF - per chart review his father assisted him with ADLs as needed.  Disposition difficult to predict at this point and will be dependent on his ability to wean from a ventilator.  Will follow acutely       Follow Up Recommendations  LTACH (depending on his ability to wean )    Equipment Recommendations  None recommended by OT    Recommendations for Other Services       Precautions / Restrictions Precautions Precautions: Fall Precaution Comments: monitor SpO2      Mobility Bed Mobility Overal bed mobility: Needs Assistance Bed Mobility: Supine to Sit;Sit to Supine     Supine to sit: Total assist;+2 for physical assistance Sit to supine: Total assist;+2 for physical assistance   General bed mobility comments: full assist across whole activity.  Pt did 0%    Transfers                 General transfer comment: not attempted     Balance Overall balance assessment: Needs assistance Sitting-balance support: Feet unsupported;No upper extremity supported;Bilateral upper extremity supported Sitting balance-Leahy Scale: Zero Sitting balance - Comments: working on activity to activate trunk with upright posture, assist for cervical extension.  Movement in/out of BOS without signs of any truncal activation                                   ADL either performed or assessed with clinical judgement   ADL                                         General ADL Comments: Pt requires total A for all aspects.  He is unable to assist      Vision   Additional Comments: eyes closed majority of the time.  He does not attempt to fixate or track      Perception Perception Comments: unable to assess due to decreased arousal    Praxis Praxis Praxis-Other Comments: unable to assess due to decreased arousal     Pertinent Vitals/Pain Pain Assessment: Faces Faces Pain Scale: No hurt Pain Intervention(s): Monitored during session     Hand Dominance Right   Extremity/Trunk Assessment Upper Extremity Assessment Upper Extremity Assessment: RUE deficits/detail;LUE deficits/detail RUE Deficits / Details: very minimal  movement noted bil. hands actively.  PROM WFL  RUE Coordination: decreased fine motor;decreased gross motor LUE Deficits / Details: very minimal movement noted bil. hands actively.  PROM WFL  LUE Coordination: decreased gross motor;decreased fine motor   Lower Extremity Assessment Lower Extremity Assessment: Defer to PT evaluation RLE Deficits / Details: noted no spontaneous movement, low tone LLE Deficits / Details: noted no spontaneous movement   Cervical / Trunk Assessment Cervical / Trunk Assessment: Other exceptions (poor head/neck and trunk control )   Communication Communication Communication: Other (comment) (intubated via ETT)   Cognition Arousal/Alertness: Lethargic (low  arousal level) Behavior During Therapy: Flat affect Overall Cognitive Status: Impaired/Different from baseline                                 General Comments: Pt keeps eyes closed but will intermittently flutter them open. He does not follow commands    General Comments  Pt ETT with 40% Fi02; 5 PEEP     Exercises Exercises: Other exercises Other Exercises Other Exercises: PROM to all 4 extremities   Shoulder Instructions      Home Living Family/patient expects to be discharged to:: Private residence Living Arrangements: Spouse/significant other Available Help at Discharge: Friend(s);Available 24 hours/day Type of Home: House Home Access: Ramped entrance     Home Layout: One level     Bathroom Shower/Tub: Tub/shower unit     Bathroom Accessibility: Yes   Home Equipment: Walker - 2 wheels;Wheelchair - manual;Shower seat;Bedside commode;Hospital bed;Walker - 4 wheels   Additional Comments: all information is from previous admissions      Prior Functioning/Environment Level of Independence: Needs assistance  Gait / Transfers Assistance Needed: household amblator with assistance ADL's / Homemaking Assistance Needed: assisted by family   Comments: info gleaned from chart as pt unable to provide info         OT Problem List: Decreased range of motion;Decreased strength;Decreased activity tolerance;Impaired balance (sitting and/or standing);Decreased coordination;Decreased cognition;Decreased safety awareness;Decreased knowledge of use of DME or AE;Cardiopulmonary status limiting activity;Impaired tone;Impaired UE functional use      OT Treatment/Interventions: Self-care/ADL training;Neuromuscular education;Therapeutic exercise;DME and/or AE instruction;Energy conservation;Manual therapy;Modalities;Splinting;Therapeutic activities;Cognitive remediation/compensation;Patient/family education;Balance training    OT Goals(Current goals can be found in the care  plan section) Acute Rehab OT Goals OT Goal Formulation: Patient unable to participate in goal setting Time For Goal Achievement: 03/15/20 Potential to Achieve Goals: Fair ADL Goals Pt Will Perform Grooming: (P) with max assist;bed level;sitting Additional ADL Goal #1: (P) Pt will sustain attention to simple ADL task x 2 mins with min cues Additional ADL Goal #2: (P) Pt will follow one step commands at least 50% of the time Additional ADL Goal #3: (P) Pt will maintain EOB sitting x 10 mins with max A in prep for ADLs  OT Frequency: Min 2X/week   Barriers to D/C: Decreased caregiver support          Co-evaluation PT/OT/SLP Co-Evaluation/Treatment: Yes Reason for Co-Treatment: Complexity of the patient's impairments (multi-system involvement);Necessary to address cognition/behavior during functional activity;For patient/therapist safety PT goals addressed during session: Mobility/safety with mobility OT goals addressed during session: Strengthening/ROM      AM-PAC OT "6 Clicks" Daily Activity     Outcome Measure Help from another person eating meals?: Total Help from another person taking care of personal grooming?: Total Help from another person toileting, which includes using toliet, bedpan, or urinal?: Total Help from another person  bathing (including washing, rinsing, drying)?: Total Help from another person to put on and taking off regular upper body clothing?: Total Help from another person to put on and taking off regular lower body clothing?: Total 6 Click Score: 6   End of Session Equipment Utilized During Treatment: Oxygen Nurse Communication: Mobility status;Other (comment) (Lt IV infiltrated )  Activity Tolerance: Patient limited by lethargy Patient left: in bed;with call bell/phone within reach  OT Visit Diagnosis: Unsteadiness on feet (R26.81);Cognitive communication deficit (R41.841)                Time: 9563-8756 OT Time Calculation (min): 26 min Charges:  OT  General Charges $OT Visit: 1 Visit OT Evaluation $OT Eval High Complexity: 1 High  Nilsa Nutting., OTR/L Acute Rehabilitation Services Pager [phone] Office [place], [place] 03/01/2020, 3:37 PM

## 2020-03-01 NOTE — Progress Notes (Signed)
SLP Cancellation Note  Patient Details Name: ROOSEVELT EIMERS MRN: [id number] DOB: June 14, 1960   Cancelled treatment:       Reason Eval/Treat Not Completed: Patient not medically ready. Remains on vent poorly arousable. Will sign off at this time. Please reorder when appropriate.    Lavert Matousek, Katherene Ponto 03/01/2020, 7:44 AM

## 2020-03-01 NOTE — Progress Notes (Signed)
LTM maintenance completed; reprepped under O1, Fp1, Fp2, and P3. No breakdown was seen.

## 2020-03-02 DIAGNOSIS — J9601 Acute respiratory failure with hypoxia: Secondary | ICD-10-CM | POA: Diagnosis not present

## 2020-03-02 DIAGNOSIS — R4182 Altered mental status, unspecified: Secondary | ICD-10-CM | POA: Diagnosis not present

## 2020-03-02 DIAGNOSIS — R569 Unspecified convulsions: Secondary | ICD-10-CM | POA: Diagnosis not present

## 2020-03-02 LAB — CBC WITH DIFFERENTIAL/PLATELET
Abs Immature Granulocytes: 0.18 10*3/uL — ABNORMAL HIGH (ref 0.00–0.07)
Basophils Absolute: 0 10*3/uL (ref 0.0–0.1)
Basophils Relative: 0 %
Eosinophils Absolute: 0 10*3/uL (ref 0.0–0.5)
Eosinophils Relative: 0 %
HCT: 52.1 % — ABNORMAL HIGH (ref 39.0–52.0)
Hemoglobin: 16.3 g/dL (ref 13.0–17.0)
Immature Granulocytes: 1 %
Lymphocytes Relative: 4 %
Lymphs Abs: 0.8 10*3/uL (ref 0.7–4.0)
MCH: 27.1 pg (ref 26.0–34.0)
MCHC: 31.3 g/dL (ref 30.0–36.0)
MCV: 86.7 fL (ref 80.0–100.0)
Monocytes Absolute: 1.9 10*3/uL — ABNORMAL HIGH (ref 0.1–1.0)
Monocytes Relative: 9 %
Neutro Abs: 19.2 10*3/uL — ABNORMAL HIGH (ref 1.7–7.7)
Neutrophils Relative %: 86 %
Platelets: 167 10*3/uL (ref 150–400)
RBC: 6.01 MIL/uL — ABNORMAL HIGH (ref 4.22–5.81)
RDW: 22 % — ABNORMAL HIGH (ref 11.5–15.5)
WBC: 22.2 10*3/uL — ABNORMAL HIGH (ref 4.0–10.5)
nRBC: 0 % (ref 0.0–0.2)

## 2020-03-02 LAB — CULTURE, BLOOD (ROUTINE X 2)
Culture: NO GROWTH
Culture: NO GROWTH

## 2020-03-02 LAB — GLUCOSE, CAPILLARY
Glucose-Capillary: 113 mg/dL — ABNORMAL HIGH (ref 70–99)
Glucose-Capillary: 129 mg/dL — ABNORMAL HIGH (ref 70–99)
Glucose-Capillary: 131 mg/dL — ABNORMAL HIGH (ref 70–99)
Glucose-Capillary: 136 mg/dL — ABNORMAL HIGH (ref 70–99)
Glucose-Capillary: 140 mg/dL — ABNORMAL HIGH (ref 70–99)
Glucose-Capillary: 150 mg/dL — ABNORMAL HIGH (ref 70–99)

## 2020-03-02 LAB — COMPREHENSIVE METABOLIC PANEL
ALT: 29 U/L (ref 0–44)
AST: 25 U/L (ref 15–41)
Albumin: 2.5 g/dL — ABNORMAL LOW (ref 3.5–5.0)
Alkaline Phosphatase: 72 U/L (ref 38–126)
Anion gap: 5 (ref 5–15)
BUN: 26 mg/dL — ABNORMAL HIGH (ref 6–20)
CO2: 31 mmol/L (ref 22–32)
Calcium: 11.2 mg/dL — ABNORMAL HIGH (ref 8.9–10.3)
Chloride: 111 mmol/L (ref 98–111)
Creatinine, Ser: 0.68 mg/dL (ref 0.61–1.24)
GFR, Estimated: >60 mL/min (ref >60)
Glucose, Bld: 137 mg/dL — ABNORMAL HIGH (ref 70–99)
Potassium: 3.5 mmol/L (ref 3.5–5.1)
Sodium: 147 mmol/L — ABNORMAL HIGH (ref 135–145)
Total Bilirubin: 1.1 mg/dL (ref 0.3–1.2)
Total Protein: 5.9 g/dL — ABNORMAL LOW (ref 6.5–8.1)

## 2020-03-02 LAB — MAGNESIUM: Magnesium: 1.8 mg/dL (ref 1.7–2.4)

## 2020-03-02 LAB — PHENYTOIN LEVEL, TOTAL: Phenytoin Lvl: 15.7 ug/mL (ref 10.0–20.0)

## 2020-03-02 LAB — PHOSPHORUS: Phosphorus: 2.7 mg/dL (ref 2.5–4.6)

## 2020-03-02 MED ORDER — PHENYTOIN SODIUM 50 MG/ML IJ SOLN
75.0000 mg | Freq: Three times a day (TID) | INTRAMUSCULAR | Status: DC
  Administered 2020-03-03 – 2020-03-05 (×6): 75 mg via INTRAVENOUS
  Filled 2020-03-02 (×6): qty 2

## 2020-03-02 MED ORDER — METOPROLOL TARTRATE 25 MG PO TABS
25.0000 mg | ORAL_TABLET | Freq: Two times a day (BID) | ORAL | Status: DC
  Administered 2020-03-02 – 2020-03-06 (×9): 25 mg
  Filled 2020-03-02 (×9): qty 1

## 2020-03-02 MED ORDER — LEVALBUTEROL HCL 0.63 MG/3ML IN NEBU
0.6300 mg | INHALATION_SOLUTION | Freq: Four times a day (QID) | RESPIRATORY_TRACT | Status: DC | PRN
  Filled 2020-03-02: qty 3
  Filled 2020-03-02: qty 0.5

## 2020-03-02 NOTE — Progress Notes (Addendum)
Chart reviewed. 59 y/o M with hx of  CAD (s/p CABG in 2006 but details unavailable), HLD, COPD, carotid artery disease, pulm HTN, chronic RV failure, tobacco use and substance use with opiate dependence admitted with acute hypoxic and hypercarbic respiratory failure. Hospitalization complicated by subarachnoid hemorrhage, severe encephalopathy, status epilepticus with continued ventilator dependent respiratory failure. Cardiology followed last week for episodes of SVT felt possibly atrial fib/flutter and ventricular bigeminy. Not felt anticoagulation candidate with SAH. EF 45-50% (previously 55-60%). Palliative care now following as well, patient has been difficult to wean from the ventilator with continued encephalopathy. Telemetry check last shows NSR/ST without recurrent arrhythmias.  [facility] will sign off.   Medication Recommendations:  No new recommendations, remains on IV metoprolol without breakthrough SVT Other recommendations (labs, testing, etc):  PCCM/neurology management for now Follow up as an outpatient:  Can reach out to our team at discharge to arrange f/u 2-3 weeks post-discharge if appropriate contingent on clinical trajectory.  Amdrew Oboyle PA-C

## 2020-03-02 NOTE — TOC Progression Note (Signed)
Transition of Care [facility]) - Progression Note    Patient Details  Name: JAHMIR SALO MRN: [id number] Date of Birth: Feb 04, 1961  Transition of Care [facility]) CM/SW Contact  Ella Bodo, RN Phone Number: 03/02/2020, 4:46 PM  Clinical Narrative: Pt remains intubated; unable to wean successfully from the vent. Palliative medicine team is following for goals of care with family; planning to see how pt does over the weekend prior to making any decisions.  PMT to follow up with family on Tuesday, 03/06/20 for continued discussions.       Expected Discharge Plan: [place] Barriers to Discharge: Continued Medical Work up  Expected Discharge Plan and Services Expected Discharge Plan: [place] In-house Referral: Clinical Social Work   Post Acute Care Choice: [place] Living arrangements for the past 2 months: [place] Determinants of Health (SDOH) Interventions    Readmission Risk Interventions Readmission Risk Prevention Plan 02/21/2020 02/16/2020  Transportation Screening Complete Complete  Social Work Consult for [place] Planning/Counseling Complete -  [place] Screening Not Applicable Not Applicable  Medication Review [employer] photographer) Complete Complete  Some recent data might be hidden   Reinaldo Raddle, RN, BSN  Trauma/Neuro ICU Case Manager [phone]

## 2020-03-02 NOTE — Progress Notes (Signed)
Subjective: Continues to be unresponsive to questions and commands.   Objective: Current vital signs: BP (!) 122/94   Pulse 95   Temp 99.6 F (37.6 C) (Oral)   Resp (!) 28   Ht 6' (1.829 m)   Wt 73 kg   SpO2 96%   BMI 21.83 kg/m  Vital signs in last 24 hours: Temp:  [98.8 F (37.1 C)-100.3 F (37.9 C)] 99.6 F (37.6 C) (11/19 0400) Pulse Rate:  [94-121] 95 (11/19 0700) Resp:  [16-31] 28 (11/19 0700) BP: (118-150)/(72-103) 122/94 (11/19 0703) SpO2:  [92 %-100 %] 96 % (11/19 0700) FiO2 (%):  [40 %] 40 % (11/19 0400) Weight:  [73 kg] 73 kg (11/19 0500)  Intake/Output from previous day: 11/18 0701 - 11/19 0700 In: 2312 [NG/GT:1608.3; IV Piggyback:703.6] Out: 3005 [Urine:3830] Intake/Output this shift: Total I/O In: 50 [NG/GT:50] Out: -  Nutritional status:  Diet Order            Diet NPO time specified  Diet effective now                HEENT: [place]/AT Lungs: Intubated Ext: Mild limb edema noted.   Neurologic Exam: Mental Status: Severely obtunded. No responses to commands. Does not open eyes to calling of his name. No attempts to communicate.  Cranial Nerves: II:  Pupils reactive 4 mm >> 2 mm. No blink to threat.  III,IV, VI: Eyes are conjugate and at the midline. There are intermittent oblique downwards nystagmoid jerks of the eyes conjugately. Does not move eyes towards or away from visual stimuli. Weak doll's eye reflexes.  V,VII: Face flaccidly symmetric.  VIII: No response to voice IX,X: Intubated XI: Head is midline XII: Intubated Motor/Sensory: Flaccid tone to RUE.  Mildly increased flexor tone to LUE. Spontaneous flexion movements noted.  Flaccid tone to BLE. Weak dorsiflexion of ankles to noxious plantar stimulation is stereotyped.  No movement of upper extremities to noxious.  Deep Tendon Reflexes:  Pathologically brisk brachioradialis and patellar reflexes bilaterally.  Plantars: Right: Upgoing   Left: Mute Cerebellar: Unable to assess   Lab  Results: Results for orders placed or performed during the hospital encounter of 02/14/20 (from the past 48 hour(s))  Glucose, capillary     Status: Abnormal   Collection Time: 02/29/20  8:53 AM  Result Value Ref Range   Glucose-Capillary 130 (H) 70 - 99 mg/dL    Comment: Glucose reference range applies only to samples taken after fasting for at least 8 hours.  Glucose, capillary     Status: Abnormal   Collection Time: 02/29/20 11:37 AM  Result Value Ref Range   Glucose-Capillary 123 (H) 70 - 99 mg/dL    Comment: Glucose reference range applies only to samples taken after fasting for at least 8 hours.  Glucose, capillary     Status: Abnormal   Collection Time: 02/29/20  3:05 PM  Result Value Ref Range   Glucose-Capillary 124 (H) 70 - 99 mg/dL    Comment: Glucose reference range applies only to samples taken after fasting for at least 8 hours.  Glucose, capillary     Status: Abnormal   Collection Time: 02/29/20  7:25 PM  Result Value Ref Range   Glucose-Capillary 131 (H) 70 - 99 mg/dL    Comment: Glucose reference range applies only to samples taken after fasting for at least 8 hours.  Glucose, capillary     Status: Abnormal   Collection Time: 02/29/20 11:08 PM  Result Value Ref Range  Glucose-Capillary 121 (H) 70 - 99 mg/dL    Comment: Glucose reference range applies only to samples taken after fasting for at least 8 hours.  Glucose, capillary     Status: Abnormal   Collection Time: 03/01/20  3:16 AM  Result Value Ref Range   Glucose-Capillary 125 (H) 70 - 99 mg/dL    Comment: Glucose reference range applies only to samples taken after fasting for at least 8 hours.  Comprehensive metabolic panel     Status: Abnormal   Collection Time: 03/01/20  7:12 AM  Result Value Ref Range   Sodium 150 (H) 135 - 145 mmol/L   Potassium 3.5 3.5 - 5.1 mmol/L   Chloride 110 98 - 111 mmol/L   CO2 32 22 - 32 mmol/L   Glucose, Bld 123 (H) 70 - 99 mg/dL    Comment: Glucose reference range applies  only to samples taken after fasting for at least 8 hours.   BUN 34 (H) 6 - 20 mg/dL   Creatinine, Ser 0.81 0.61 - 1.24 mg/dL   Calcium 11.6 (H) 8.9 - 10.3 mg/dL   Total Protein 5.8 (L) 6.5 - 8.1 g/dL   Albumin 2.6 (L) 3.5 - 5.0 g/dL   AST 21 15 - 41 U/L   ALT 28 0 - 44 U/L   Alkaline Phosphatase 67 38 - 126 U/L   Total Bilirubin 0.9 0.3 - 1.2 mg/dL   GFR, Estimated >60 >60 mL/min    Comment: (NOTE) Calculated using the CKD-EPI Creatinine Equation (2021)    Anion gap 8 5 - 15    Comment: Performed at [facility], [place] [address] [place], [place] [address]  Magnesium     Status: None   Collection Time: 03/01/20  7:12 AM  Result Value Ref Range   Magnesium 1.9 1.7 - 2.4 mg/dL    Comment: Performed at [facility] [address], [place], [place] [address]  Phosphorus     Status: Abnormal   Collection Time: 03/01/20  7:12 AM  Result Value Ref Range   Phosphorus 2.1 (L) 2.5 - 4.6 mg/dL    Comment: Performed at [facility] [address], [place], [place] [address]  CBC with Differential/Platelet     Status: Abnormal   Collection Time: 03/01/20  7:12 AM  Result Value Ref Range   WBC 14.5 (H) 4.0 - 10.5 K/uL   RBC 6.30 (H) 4.22 - 5.81 MIL/uL   Hemoglobin 16.7 13.0 - 17.0 g/dL   HCT 54.2 (H) 39 - 52 %   MCV 86.0 80.0 - 100.0 fL   MCH 26.5 26.0 - 34.0 pg   MCHC 30.8 30.0 - 36.0 g/dL   RDW 21.8 (H) 11.5 - 15.5 %   Platelets 194 150 - 400 K/uL   nRBC 0.0 0.0 - 0.2 %   Neutrophils Relative % 84 %   Neutro Abs 12.2 (H) 1.7 - 7.7 K/uL   Lymphocytes Relative 7 %   Lymphs Abs 1.0 0.7 - 4.0 K/uL   Monocytes Relative 9 %   Monocytes Absolute 1.3 (H) 0.1 - 1.0 K/uL   Eosinophils Relative 0 %   Eosinophils Absolute 0.0 0.0 - 0.5 K/uL   Basophils Relative 0 %   Basophils Absolute 0.0 0.0 - 0.1 K/uL   Immature Granulocytes 0 %   Abs Immature Granulocytes 0.06 0.00 - 0.07 K/uL    Comment: Performed at [facility], [place] [address] [place], [place] [address]  Phenytoin level, total     Status: None   Collection Time: 03/01/20  7:12 AM  Result Value Ref Range   Phenytoin Lvl 19.1 10.0 - 20.0 ug/mL    Comment: Performed at [facility] [address] [place], [place] [address]  Glucose, capillary     Status: Abnormal   Collection Time: 03/01/20  8:01 AM  Result Value Ref Range   Glucose-Capillary 110 (H) 70 - 99 mg/dL    Comment: Glucose reference range applies only to samples taken after fasting for at least 8 hours.  Glucose, capillary     Status: Abnormal   Collection Time: 03/01/20 11:18 AM  Result Value Ref Range   Glucose-Capillary 136 (H) 70 - 99 mg/dL    Comment: Glucose reference range applies only to samples taken after fasting for at least 8 hours.  Glucose, capillary     Status: Abnormal   Collection Time: 03/01/20  3:56 PM  Result Value Ref Range   Glucose-Capillary 163 (H) 70 - 99 mg/dL    Comment: Glucose reference range applies only to samples taken after fasting for at least 8 hours.  Glucose, capillary     Status: Abnormal   Collection Time: 03/01/20  7:34 PM  Result Value Ref Range   Glucose-Capillary 141 (H) 70 - 99 mg/dL    Comment: Glucose reference range applies only to samples taken after fasting for at least 8 hours.  Glucose, capillary     Status: Abnormal   Collection Time: 03/01/20 11:27 PM  Result Value Ref Range   Glucose-Capillary 120 (H) 70 - 99 mg/dL    Comment: Glucose reference range applies only to samples taken after fasting for at least 8 hours.  Glucose, capillary     Status: Abnormal   Collection Time: 03/02/20  3:16 AM  Result Value Ref Range   Glucose-Capillary 131 (H) 70 - 99 mg/dL    Comment: Glucose reference range applies only to samples taken after fasting for at least 8 hours.  Magnesium     Status: None   Collection Time: 03/02/20  3:43 AM  Result Value Ref Range   Magnesium 1.8 1.7 - 2.4 mg/dL    Comment: Performed at [facility] [address] [place], [place]  [address]  Phosphorus     Status: None   Collection Time: 03/02/20  3:43 AM  Result Value Ref Range   Phosphorus 2.7 2.5 - 4.6 mg/dL    Comment: Performed at [facility] [address], [place], [place] [address]  CBC with Differential/Platelet     Status: Abnormal   Collection Time: 03/02/20  3:43 AM  Result Value Ref Range   WBC 22.2 (H) 4.0 - 10.5 K/uL   RBC 6.01 (H) 4.22 - 5.81 MIL/uL   Hemoglobin 16.3 13.0 - 17.0 g/dL   HCT 52.1 (H) 39 - 52 %   MCV 86.7 80.0 - 100.0 fL   MCH 27.1 26.0 - 34.0 pg   MCHC 31.3 30.0 - 36.0 g/dL   RDW 22.0 (H) 11.5 - 15.5 %   Platelets 167 150 - 400 K/uL    Comment: REPEATED TO VERIFY   nRBC 0.0 0.0 - 0.2 %   Neutrophils Relative % 86 %   Neutro Abs 19.2 (H) 1.7 - 7.7 K/uL   Lymphocytes Relative 4 %   Lymphs Abs 0.8 0.7 - 4.0 K/uL   Monocytes Relative 9 %   Monocytes Absolute 1.9 (H) 0.1 - 1.0  K/uL   Eosinophils Relative 0 %   Eosinophils Absolute 0.0 0.0 - 0.5 K/uL   Basophils Relative 0 %   Basophils Absolute 0.0 0.0 - 0.1 K/uL   Immature Granulocytes 1 %   Abs Immature Granulocytes 0.18 (H) 0.00 - 0.07 K/uL   Target Cells PRESENT     Comment: Performed at [facility] [address], [place], [place] [address]  Comprehensive metabolic panel     Status: Abnormal   Collection Time: 03/02/20  3:43 AM  Result Value Ref Range   Sodium 147 (H) 135 - 145 mmol/L   Potassium 3.5 3.5 - 5.1 mmol/L   Chloride 111 98 - 111 mmol/L   CO2 31 22 - 32 mmol/L   Glucose, Bld 137 (H) 70 - 99 mg/dL    Comment: Glucose reference range applies only to samples taken after fasting for at least 8 hours.   BUN 26 (H) 6 - 20 mg/dL   Creatinine, Ser 0.68 0.61 - 1.24 mg/dL   Calcium 11.2 (H) 8.9 - 10.3 mg/dL   Total Protein 5.9 (L) 6.5 - 8.1 g/dL   Albumin 2.5 (L) 3.5 - 5.0 g/dL   AST 25 15 - 41 U/L   ALT 29 0 - 44 U/L   Alkaline Phosphatase 72 38 - 126 U/L   Total Bilirubin 1.1 0.3 - 1.2 mg/dL   GFR, Estimated >60 >60 mL/min    Comment:  (NOTE) Calculated using the CKD-EPI Creatinine Equation (2021)    Anion gap 5 5 - 15    Comment: Performed at [facility], [place] [address], [place], [place] [address]  Phenytoin level, total     Status: None   Collection Time: 03/02/20  3:43 AM  Result Value Ref Range   Phenytoin Lvl 15.7 10.0 - 20.0 ug/mL    Comment: Performed at [facility] [address] [place], [place] [address]  Glucose, capillary     Status: Abnormal   Collection Time: 03/02/20  7:24 AM  Result Value Ref Range   Glucose-Capillary 113 (H) 70 - 99 mg/dL    Comment: Glucose reference range applies only to samples taken after fasting for at least 8 hours.    Recent Results (from the past 240 hour(s))  Culture, blood (Routine X 2) w Reflex to ID Panel     Status: None (Preliminary result)   Collection Time: 02/26/20  4:37 PM   Specimen: BLOOD LEFT HAND  Result Value Ref Range Status   Specimen Description BLOOD LEFT HAND  Final   Special Requests   Final    BOTTLES DRAWN AEROBIC ONLY Blood Culture results may not be optimal due to an inadequate volume of blood received in culture bottles   Culture   Final    NO GROWTH 4 DAYS Performed at [facility], [place] [address], [place], [place] [address]    Report Status PENDING  Incomplete  Culture, blood (Routine X 2) w Reflex to ID Panel     Status: None (Preliminary result)   Collection Time: 02/26/20  4:37 PM   Specimen: BLOOD LEFT HAND  Result Value Ref Range Status   Specimen Description BLOOD LEFT HAND  Final   Special Requests   Final    BOTTLES DRAWN AEROBIC ONLY Blood Culture results may not be optimal due to an inadequate volume of blood received in culture bottles   Culture   Final    NO GROWTH 4 DAYS Performed at  [facility], [place] [address], [place], [place] [address]    Report Status PENDING  Incomplete  MRSA PCR Screening     Status: None   Collection Time: 02/27/20  6:03 AM   Specimen: Nasal Mucosa; Nasopharyngeal   Result Value Ref Range Status   MRSA by PCR NEGATIVE NEGATIVE Final    Comment:        The GeneXpert MRSA Assay (FDA approved for NASAL specimens only), is one component of a comprehensive MRSA colonization surveillance program. It is not intended to diagnose MRSA infection nor to guide or monitor treatment for MRSA infections. Performed at [facility], [place] [address] [place], [place] [address]     Lipid Panel No results for input(s): CHOL, TRIG, HDL, CHOLHDL, VLDL, LDLCALC in the last 72 hours.  Studies/Results: No results found.  Medications:  Scheduled: . chlorhexidine gluconate (MEDLINE KIT)  15 mL Mouth Rinse BID  . Chlorhexidine Gluconate Cloth  6 each Topical Daily  . famotidine  20 mg Per Tube BID  . feeding supplement (PROSource TF)  45 mL Per Tube TID  . free water  200 mL Per Tube Q8H  . influenza vac split quadrivalent PF  0.5 mL Intramuscular Tomorrow-1000  . insulin aspart  0-6 Units Subcutaneous Q4H  . lacosamide  200 mg Per Tube BID  . levETIRAcetam  1,500 mg Per Tube BID  . mouth rinse  15 mL Mouth Rinse 10 times per day  . metoprolol tartrate  5 mg Intravenous Q6H  . phenytoin (DILANTIN) IV  75 mg Intravenous Q8H  . pneumococcal 23 valent vaccine  0.5 mL Intramuscular Tomorrow-1000   Continuous: . sodium chloride Stopped (02/28/20 0203)  . sodium chloride    . feeding supplement (OSMOLITE 1.5 CAL) 1,000 mL (03/01/20 2346)    Assessment/Recommendations:59 y.o.malewith history of CAD, COPD,opioid dependencyand tobacco usead[facility], hypoxia, hypotension and was intubated (extubated 02/17/20). Hospital course at [facility] complicated by Staph Epi bacteremia, AKI, cardiology consult for SVT (probable rapid afib per Dr Domenic Polite), fluid overload, EF 45-50% by echo, elevated LFTs, previous Rt MCA infarct by CT 02/18/20, seizure activity noted 02/22/20, smallSAHon CT (NS consult Dr Duffy Rhody), EEG 11/10, Neurology consult  11/10 Dr [name] ->Keppra, recurrent seizures - [facility]'d to [facility]25/20.[place]- right frontoparietal region, source unclear - needs to rule out endocarditis  Acute subarachnoid hemorrhage and old strokes  CT Head - 11/12 -Stable small volume frontal and parietal subarachnoid hemorrhage.Unchanged right temporoparietal encephalomalacia remained to remote cortical infarct.   CT head -11/11 -Stable subarachnoid hemorrhage in the right anterior mid parietal lobe regions.  CTA head and neck:Unchanged small volume subarachnoid hemorrhage over the right convexity and at the right frontal pole. No intracranial arterial occlusion or high-grade stenosis. 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  MRI headwith and without contrast:Small amount of subarachnoid hemorrhage over the right frontal pole and superior right parietal lobe. Areas of leptomeningeal contrast enhancement in the leftcerebellum and within the superior cerebral hemispheres, favored to be due to late subacute to chronic ischemia. Advanced atrophy and chronic microvascular disease. 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  2D Echo- [id number] to 50%. No cardiac source of emboli identified.  VTE prophylaxis -SCDs  Aspirin 81 mg daily and clopidogrel 75 mg dailyprior to admission, now on no antithrombotic.  Code status -Full code  Other Stroke Risk Factors:Advanced age,Hx stroke/TIA- right parietal old infarct on imaging,Coronary artery diseaseon DAPT PTA,Hx of substanceabuse  Seizure   11/10 episode of right sided  shaking followed by right sided hemiplegia.11/12 overnight more episode of right sided tonic clonic activity, and more altered.EEG - 11/14showed continuous slow, generalized.No seizures or epileptiform discharges were seen throughout the recording. Was loaded with keppra and keppra 578m Q8h->10079mQ8h. Vimpat 20030midalso started. Overnight Sun-Mon 11/15 the patient exhibited intermittent LUE twitching  concerning for seizure recurrence. Reintubated overnight (11/15) for airway protection due to seizure activity concerning for possible status epilepticus, with associated obtundation and unresponsiveness between bouts of seizure activity. Of note, the seizure acitivity resolved after Versed for intubation. LTM EEG was started. Versed had been stopped and on Monday 11/15 thepatienthad a second recurrence of seizure like activitywith downward gaze and left arm twitchingat about 7:50 AM.Per Dr. YadHortense RamalEG revealed right sided epileptiform discharges. Fosphenytoin load was ordered and Dilantinscheduled dosing started at100 mg TID.  Now on Keppra, Vimpat and Dilantin. Dilantin level was therapeutic at 19.1 on 11/18.  Continuous EEG off sedationfor 24 hours (11/17-11/18) showed generalized continuous slowing without seizures or definite epileptiform discharges. The studywassuggestive ofmoderate tosevere diffuse encephalopathy. EEG appeared to be improving compared to previous day. LTM EEG has been discontinued.  Continues off sedation. 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   Goals of Care:  Would discuss with family  Given plateauing neurological function off sedation, there is now a higher likelihood of a poor long-term neurological prognosis.  CODE STATUS - pt is DNR.    35 minutes spent in the neurological evaluation and management of this critically ill patient   LOS: 17 days   '@Electronically'  signed: Dr. EriKerney Elbe/19/2021  8:08 AM

## 2020-03-02 NOTE — Progress Notes (Signed)
NAME:  Antonio Ramirez, MRN:  [id number], DOB:  1960-09-19, LOS: 71 ADMISSION DATE:  02/14/2020, CONSULTATION DATE:  02/27/20 REFERRING MD: Marlowe Sax CHIEF COMPLAINT:  Acute encephalopathy, seizures, status epilepticus  Brief History   59 year old man with COPD, hx of Drug abuse, admitted with hypercarbic respiratory failure, intubated in the ER11/2, not responsive to narcan.     History of present illness    59 y.o. male with medical history significant for opiate dependence, COPD, carotid artery disease, depression.  Initially admitted with AMS on 11/2.  No respiratory symptoms, but hypoxic to 70s at the time of admission on RA.  No known abuse of medications.  Took oxycodone per schedule pta.   Received 2 doses of Moderna Vaccine- April/May. He was intubated in the ED for acute hypoxic and hypercarbic respiratory failure. Extubated 11/5.  Remained sleepy 11/6-11/7, intermittent agitation 11/8 Increasingly alert 11/9 after bipap.  11/10 per cards note not following commands, jerking on R side.  Started on Keppra bolus and then 500 mg TID.  11/10 non responsive per neuro note. 11/11 still lethargic.  11/12persistent seizures, sent to [facility] for cont EEG.  11/13 transferred from [facility], needed increased O2 (16L NRB).  Withdrawing to stimuti. Persistent seizures, babbling incoherently.  11/14 am - able to say name, but + dysarthria. Awake but incoherent, not following commands, withdrawing to stimuli.  Completed 24 hr EEG. "IMPRESSION: This study is suggestive of mild to moderate diffuse encephalopathy, nonspecific etiology.No seizures or epileptiform discharges were seen throughout the recording." Antiplt agents held.   11/15: developed status epilepticus, no improvement with ativan 1mg  x 3 (?), completely obtunded and non responsive between bouts of seizure activity. Non responsive to any stimuli on my exam.  Intubated for airway protection.   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 No evidence of interval hemorrhage. No abnormal mass effect or midline shift. Remote right parietotemporal infarct again noted.  EEG 11/16 > two seizures from right frontocentral region. EEG 11/18 > mod to severe diffuse encephalopathy.  No seizures, no epileptiform discharge  Micro Data:  Urine 11/2  > neg  BC x 2   11/2 >>>staph epidermidis BC x 2  11/4 >>> MRSA PCR  11/3  Neg   Antimicrobials:  maxepime  11/2 x1 Flagyl  11/2 -11/3 Vanc  11/2x1 Ancef 11/3 >>>11/7  Interim history/subjective:   Very weak, ventilated No new issues reported overnight EEG 11/18 with encephalopathy, no seizures or definite epileptiform discharges I/O- 1.9 L total  Objective   Blood pressure 100/82, pulse (!) 106, temperature 100.2 F (37.9 C), temperature source Axillary, resp. rate (!) 35, height 6' (1.829 m), weight 73 kg, SpO2 95 %.    Vent Mode: PRVC FiO2 (%):  [40 %]  40 % Set Rate:  [16 bmp] 16 bmp Vt Set:  [620 mL] 620 mL PEEP:  [5 cmH20] 5 cmH20 Pressure Support:  [15 cmH20] 15 cmH20 Plateau Pressure:  [15 cmH20-16 cmH20] 16 cmH20   Intake/Output Summary (Last 24 hours) at 03/02/2020 1010 Last data filed at 03/02/2020 0800 Gross per 24 hour  Intake 2111.95 ml  Output 3580 ml  Net -1468.05 ml    Filed Weights   02/27/20 0500 02/29/20 0428 03/02/20 0500  Weight: 77 kg 77.5 kg 73 kg    Examination: General: Acute and chronically ill-appearing man, ventilated HEENT: ET tube in place, poor dentition, oropharynx otherwise clear Neuro: Opens his eyes to voice, spontaneously breathes over the set rate and on PSV, flicker of movement of his left upper extremity on command.  Not on the right.  Profoundly weak globally weak CV: Regular, borderline tachycardic, no murmur PULM: Clear bilaterally, no crackles or wheezes GI: Nondistended, positive bowel sounds GU: Foley catheter in place Extremities: Trace pretibial edema Skin: No rash  Assessment & Plan:   Critically ill due to status epilepticus - now resolved, no further seizures noted on EEG or clinically since 11/16. -Appreciate neurology management -Keppra, Vimpat, phenytoin as ordered -Minimize, stop sedating medications and follow for neurological improvement  Critically ill due to acute acute hypoxemic hypercarbic respiratory failure. Acute exacerbation of severe COPD. Improved with steroid Recurrent respiratory failure due to seizures, status epilepticus 11/16.  Remains intubated -Continue current ventilator support.  He was able to tolerate some PSV 15 on 11/19.  Continue to minimize sedation and follow for neurological improvement.  Hopefully once this barrier is resolved he will tolerate SBT -Continue bronchodilators as ordered -Follow intermittent chest x-ray  Cor pulmonale by Echo this admit with neg perfusion scan ruling out CTEPH.  [place] WHO group III so rx is treat the underlying condition  (COPD) and make sure 02 sats stay > 90% at all times. -Supportive care  PAF - currently in NSR. -Appreciate cardiology input, on schedule metoprolol IV, plan transition to per tube 11/19  LUL nodule. -Follow as an outpatient once acute issues have been stabilized  Hypernatremia. -Continue free water as ordered -Follow  BMP  Hypophosphatemia. -Phosphorus repleted 11/18 -Follow BMP  Best practice:  Diet: Tube feedings. Pain/Anxiety/Delirium protocol (if indicated): None. VAP protocol (if indicated): bundle in place.  DVT prophylaxis:  lovenox. GI prophylaxis: pepcid. Glucose control: ISS prn. Mobility: bedrest. Code Status: Full Code. Family Communication: [place] updated 11/17. Made a  DNR per her request. States he's been declining since 2014. Disposition: ICU.  Independent critical care time 32 minutes  Baltazar Apo, MD, PhD 03/02/2020, 10:21 AM [facility] [phone] or if no answer [phone]

## 2020-03-02 NOTE — Consult Note (Signed)
WOC Nurse Consult Note: Patient receiving care in [facility] 4N25. Assisted with turning for wound evaluation by primary RN, Amy. Patient intubated. Reason for Consult: deep tissue injury Wound type: DTPI to sacral/coccyx area at the apex of the gluteal cleft. Pressure Injury POA: No Measurement: 1.7 cm x 0.8 cm x no measurable depth Wound bed: dark purple, linear in nature Drainage (amount, consistency, odor) none Periwound: intact, with an area of scarred healing on right buttock. Dressing procedure/placement/frequency: Sacral dressing in use as prophylactic measure.  Continue the use of this foam.  I have ordered bilateral Prevalon boots to help with preventing heel injuries.  Primary RN stated a Palliative Care conference is due to be performed next week on Tuesday. Monitor the wound area(s) for worsening of condition such as: Signs/symptoms of infection,  Increase in size,  Development of or worsening of odor, Development of pain, or increased pain at the affected locations.  Notify the medical team if any of these develop. Val Riles, RN, MSN, CWOCN, CNS-BC, pager [phone]

## 2020-03-03 LAB — GLUCOSE, CAPILLARY
Glucose-Capillary: 139 mg/dL — ABNORMAL HIGH (ref 70–99)
Glucose-Capillary: 138 mg/dL — ABNORMAL HIGH (ref 70–99)
Glucose-Capillary: 147 mg/dL — ABNORMAL HIGH (ref 70–99)
Glucose-Capillary: 105 mg/dL — ABNORMAL HIGH (ref 70–99)
Glucose-Capillary: 139 mg/dL — ABNORMAL HIGH (ref 70–99)
Glucose-Capillary: 120 mg/dL — ABNORMAL HIGH (ref 70–99)

## 2020-03-03 LAB — BASIC METABOLIC PANEL
Anion gap: 8 (ref 5–15)
BUN: 27 mg/dL — ABNORMAL HIGH (ref 6–20)
CO2: 32 mmol/L (ref 22–32)
Calcium: 11.8 mg/dL — ABNORMAL HIGH (ref 8.9–10.3)
Chloride: 105 mmol/L (ref 98–111)
Creatinine, Ser: 0.82 mg/dL (ref 0.61–1.24)
GFR, Estimated: >60 mL/min (ref >60)
Glucose, Bld: 135 mg/dL — ABNORMAL HIGH (ref 70–99)
Potassium: 3.7 mmol/L (ref 3.5–5.1)
Sodium: 145 mmol/L (ref 135–145)

## 2020-03-03 LAB — CBC
HCT: 52.3 % — ABNORMAL HIGH (ref 39.0–52.0)
Hemoglobin: 16.2 g/dL (ref 13.0–17.0)
MCH: 26.9 pg (ref 26.0–34.0)
MCHC: 31 g/dL (ref 30.0–36.0)
MCV: 86.7 fL (ref 80.0–100.0)
Platelets: 181 10*3/uL (ref 150–400)
RBC: 6.03 MIL/uL — ABNORMAL HIGH (ref 4.22–5.81)
RDW: 22.4 % — ABNORMAL HIGH (ref 11.5–15.5)
WBC: 20.7 10*3/uL — ABNORMAL HIGH (ref 4.0–10.5)
nRBC: 0 % (ref 0.0–0.2)

## 2020-03-03 LAB — MAGNESIUM: Magnesium: 2.1 mg/dL (ref 1.7–2.4)

## 2020-03-03 LAB — PHENYTOIN LEVEL, TOTAL: Phenytoin Lvl: 12.4 ug/mL (ref 10.0–20.0)

## 2020-03-03 NOTE — Progress Notes (Signed)
NAME:  Antonio Ramirez, MRN:  [id number], DOB:  [birth year], LOS: 16 ADMISSION DATE:  02/14/2020, CONSULTATION DATE:  02/27/20 REFERRING MD: Marlowe Sax CHIEF COMPLAINT:  Acute encephalopathy, seizures, status epilepticus  Brief History   59 year old man with COPD, hx of Drug abuse, admitted with hypercarbic respiratory failure, intubated in the ER11/2, not responsive to narcan.     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 No evidence of interval hemorrhage. No abnormal mass effect or midline shift. Remote right parietotemporal infarct again noted.  EEG 11/16 > two seizures from right frontocentral region. EEG 11/18 > mod to severe diffuse encephalopathy.  Micro Data:  Urine 11/2  > neg  BC x 2   11/2 >>>staph epidermidis BC x 2  11/4 >>> MRSA PCR  11/3  Neg   Antimicrobials:  maxepime  11/2 x1 Flagyl  11/2 -11/3 Vanc  11/2x1 Ancef 11/3 >>>11/7  Interim history/subjective:  Patient unable to tolerate pressure support trial due to tachypnea and hypoxia, had to put back on full mechanical ventilatory support  Objective   Blood pressure (!) 148/86, pulse (!) 101, temperature 98.7 F (37.1 C), temperature source Axillary, resp. rate (!) 28, height 6' (1.829 m), weight 73 kg, SpO2 95 %.    Vent  Mode: PSV FiO2 (%):  [40 %] 40 % Set Rate:  [16 bmp] 16 bmp Vt Set:  [620 mL] 620 mL PEEP:  [5 cmH20] 5 cmH20 Pressure Support:  [8 cmH20] Berkley Pressure:  [10 cmH20-22 cmH20] 22 cmH20   Intake/Output Summary (Last 24 hours) at 03/03/2020 1610 Last data filed at 03/03/2020 1500 Gross per 24 hour  Intake 1655 ml  Output 1750 ml  Net -95 ml   Filed Weights   02/27/20 0500 02/29/20 0428 03/02/20 0500  Weight: 77 kg 77.5 kg 73 kg    Examination: General: Middle-age cachectic male, orally intubated, lying on the bed HEENT: Atraumatic, normocephalic, ETT and OGT in place Neuro: Opens eyes, tracks examiner but does not follow commands CV: Regular rate and rhythm, no murmur PULM: Faint wheezes bilaterally, no crackles GI: soft, bsx4 active  Extremities: warm/dry,  edema  Skin: no rashes or lesions  Assessment & Plan:   Status epilepticus, now resolved EEG showed no more seizures Neurology input is appreciated, recommend continuing Keppra, Vimpat and phenytoin no further seizures noted on EEG.  Acute acute hypoxemic/ hypercarbic respiratory failure due to Acute exacerbation of severe COPD Unable to tolerate pressure support trial due to tachypnea and hypoxia Patient does not want tracheostomy or long-term dependent on ventilator Avoid sedation We will try SBT in the morning again Continue BD's.  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 Supportive care.  PAF - currently in NSR. Continue beta-blocker.  LUL nodule. CT chest once stabilized and over acute issues.  Hypernatremia, improved Continue Free water 200mg  q8hrs. Follow BMP.  Hypophosphatemia. Continue electrolyte supplement  Best practice:  Diet: Tube feedings. Pain/Anxiety/Delirium protocol (if indicated): None. VAP protocol (if indicated): bundle in place.  DVT prophylaxis:  lovenox. GI prophylaxis:  pepcid. Glucose control: ISS prn. Mobility: bedrest. Code Status: DNR Family Communication: We will update patient's family Disposition: ICU.  Total critical care time: 46 minutes  Performed by: [place] care time was exclusive of separately billable procedures and treating other patients.   Critical care was necessary to treat or prevent imminent or life-threatening deterioration.   Critical care was time spent personally by me on the following activities: development of treatment plan with patient and/or surrogate as well as nursing, discussions with consultants, evaluation of patient's response to treatment, examination of patient, obtaining history from patient or surrogate, ordering and performing treatments and interventions, ordering and review of laboratory studies, ordering and review of radiographic studies, pulse oximetry and re-evaluation of patient's condition.   Jacky Kindle MD Critical care physician [facility]  Pager: [phone] Mobile: [phone]

## 2020-03-04 DIAGNOSIS — J9601 Acute respiratory failure with hypoxia: Secondary | ICD-10-CM | POA: Diagnosis not present

## 2020-03-04 DIAGNOSIS — R4182 Altered mental status, unspecified: Secondary | ICD-10-CM | POA: Diagnosis not present

## 2020-03-04 DIAGNOSIS — Z515 Encounter for palliative care: Secondary | ICD-10-CM | POA: Diagnosis not present

## 2020-03-04 DIAGNOSIS — R569 Unspecified convulsions: Secondary | ICD-10-CM | POA: Diagnosis not present

## 2020-03-04 LAB — GLUCOSE, CAPILLARY
Glucose-Capillary: 143 mg/dL — ABNORMAL HIGH (ref 70–99)
Glucose-Capillary: 124 mg/dL — ABNORMAL HIGH (ref 70–99)
Glucose-Capillary: 132 mg/dL — ABNORMAL HIGH (ref 70–99)
Glucose-Capillary: 140 mg/dL — ABNORMAL HIGH (ref 70–99)
Glucose-Capillary: 141 mg/dL — ABNORMAL HIGH (ref 70–99)

## 2020-03-04 MED ORDER — FENTANYL CITRATE (PF) 100 MCG/2ML IJ SOLN
50.0000 ug | INTRAMUSCULAR | Status: DC | PRN
  Administered 2020-03-04 – 2020-03-05 (×4): 100 ug via INTRAVENOUS
  Filled 2020-03-04 (×4): qty 2

## 2020-03-04 NOTE — Progress Notes (Signed)
Patient ID: Antonio Ramirez, male   DOB: February 25, 1961, 59 y.o.   MRN: [id number]  59 year old man who remains critically ill due to status epilepticus requiring mechanical ventilation for airway protection on a background history of severe COPD with cor pulmonale.  This NP reviewed medical records, received report from team and then visited patient at the bedside  for palliative needs and emotional support.  Patient remains intubated.   Patient is off sedation, patient is tolerating pressure support trial and following simple commands.  Patient may extubate successfully and neurology suggests enough neurologic function to be a rehab candidate.  I spoke with the patient's significant other by telephone and she is requesting that discussion regarding extubation and the ongoing decisions regarding future plans of care be held until Tuesday at noon as previously discussed     Significant other and her support person plan to meet with this nurse practitioner on Tuesday at noon for ongoing discussion regarding next steps and plan of care.   Emotional support offered.     Education offered on the difference between an aggressive medical intervention path and a palliative comfort path for this patient, in his current medical situation.  F/U meeting is set for Tuesday at 1200  Discussed with SO/[name] the importance of continued conversation with all family and the medical providers regarding overall plan of care and treatment options,  ensuring decisions are within the context of the patients values and GOCs.  Decisions will be made along the way depending on patient outcomes.   PMT will continue to support holistically  Questions and concerns addressed     Total time spent on the unit was 25 minutes  Greater than 50% of the time was spent in counseling and coordination of care  Wadie Lessen NP  Palliative Medicine Team Team Phone # [phone] Pager [phone]

## 2020-03-04 NOTE — Progress Notes (Signed)
Subjective: The patient is now awake and can track visually as well as follow simple motor commands.   Objective: Current vital signs: BP 127/86 (BP Location: Right Leg)   Pulse (!) 109   Temp 98.2 F (36.8 C) (Axillary)   Resp 16   Ht 6' (1.829 m)   Wt 69.5 kg   SpO2 95%   BMI 20.78 kg/m  Vital signs in last 24 hours: Temp:  [98.2 F (36.8 C)-100.2 F (37.9 C)] 98.2 F (36.8 C) (11/21 0800) Pulse Rate:  [91-112] 109 (11/21 0800) Resp:  [16-37] 16 (11/21 0400) BP: (97-158)/(76-95) 127/86 (11/21 0800) SpO2:  [93 %-96 %] 95 % (11/21 0800) FiO2 (%):  [40 %] 40 % (11/21 0800) Weight:  [69.5 kg] 69.5 kg (11/21 0500)  Intake/Output from previous day: 11/20 0701 - 11/21 0700 In: 1050 [NG/GT:1050] Out: 2600 [Urine:2600] Intake/Output this shift: Total I/O In: 200 [NG/GT:200] Out: -  Nutritional status:  Diet Order            Diet NPO time specified  Diet effective now                HEENT: [place]/AT Lungs: Intubated Ext: No significant edema  Neurologic Exam: Ment: Awakens to voice. Will track visually, nod yes to some questions and will follow some simple commands. He is drowsy.  CN: PERRL. Tracks visually with saccadic pursuits. Face grossly symmetric - did not smile to command.  Motor/Cerebellar: Will flex right and left upper extremity at elbow when asked. Left is weaker than right.  Weak dorsiflexion of right and left foot to noxious plantar stimulation, more briskly on the right.  Reflexes: Brisk 3+ bilateral brachioradialis, worse on the left. Brisk 3+ patellar reflexes bilaterally.   Lab Results: Results for orders placed or performed during the hospital encounter of 02/14/20 (from the past 48 hour(s))  Glucose, capillary     Status: Abnormal   Collection Time: 03/02/20 11:16 AM  Result Value Ref Range   Glucose-Capillary 136 (H) 70 - 99 mg/dL    Comment: Glucose reference range applies only to samples taken after fasting for at least 8 hours.  Glucose,  capillary     Status: Abnormal   Collection Time: 03/02/20  3:15 PM  Result Value Ref Range   Glucose-Capillary 140 (H) 70 - 99 mg/dL    Comment: Glucose reference range applies only to samples taken after fasting for at least 8 hours.  Glucose, capillary     Status: Abnormal   Collection Time: 03/02/20  7:30 PM  Result Value Ref Range   Glucose-Capillary 129 (H) 70 - 99 mg/dL    Comment: Glucose reference range applies only to samples taken after fasting for at least 8 hours.  Glucose, capillary     Status: Abnormal   Collection Time: 03/02/20 11:36 PM  Result Value Ref Range   Glucose-Capillary 150 (H) 70 - 99 mg/dL    Comment: Glucose reference range applies only to samples taken after fasting for at least 8 hours.  Glucose, capillary     Status: Abnormal   Collection Time: 03/03/20  3:32 AM  Result Value Ref Range   Glucose-Capillary 139 (H) 70 - 99 mg/dL    Comment: Glucose reference range applies only to samples taken after fasting for at least 8 hours.  Basic metabolic panel     Status: Abnormal   Collection Time: 03/03/20  4:28 AM  Result Value Ref Range   Sodium 145 135 - 145 mmol/L  Potassium 3.7 3.5 - 5.1 mmol/L   Chloride 105 98 - 111 mmol/L   CO2 32 22 - 32 mmol/L   Glucose, Bld 135 (H) 70 - 99 mg/dL    Comment: Glucose reference range applies only to samples taken after fasting for at least 8 hours.   BUN 27 (H) 6 - 20 mg/dL   Creatinine, Ser 0.82 0.61 - 1.24 mg/dL   Calcium 11.8 (H) 8.9 - 10.3 mg/dL   GFR, Estimated >60 >60 mL/min    Comment: (NOTE) Calculated using the CKD-EPI Creatinine Equation (2021)    Anion gap 8 5 - 15    Comment: Performed at [facility] [address], [place], [place] [address]  Magnesium     Status: None   Collection Time: 03/03/20  4:28 AM  Result Value Ref Range   Magnesium 2.1 1.7 - 2.4 mg/dL    Comment: Performed at [facility] [address] [place], [place] [address]  CBC     Status: Abnormal   Collection  Time: 03/03/20  4:28 AM  Result Value Ref Range   WBC 20.7 (H) 4.0 - 10.5 K/uL   RBC 6.03 (H) 4.22 - 5.81 MIL/uL   Hemoglobin 16.2 13.0 - 17.0 g/dL   HCT 52.3 (H) 39 - 52 %   MCV 86.7 80.0 - 100.0 fL   MCH 26.9 26.0 - 34.0 pg   MCHC 31.0 30.0 - 36.0 g/dL   RDW 22.4 (H) 11.5 - 15.5 %   Platelets 181 150 - 400 K/uL    Comment: REPEATED TO VERIFY   nRBC 0.0 0.0 - 0.2 %    Comment: Performed at [facility], [place] [address] [place], [place] [address]  Phenytoin level, total     Status: None   Collection Time: 03/03/20  4:28 AM  Result Value Ref Range   Phenytoin Lvl 12.4 10.0 - 20.0 ug/mL    Comment: Performed at [facility] [address] [place], [place] [address]  Glucose, capillary     Status: Abnormal   Collection Time: 03/03/20  7:23 AM  Result Value Ref Range   Glucose-Capillary 138 (H) 70 - 99 mg/dL    Comment: Glucose reference range applies only to samples taken after fasting for at least 8 hours.  Glucose, capillary     Status: Abnormal   Collection Time: 03/03/20 11:57 AM  Result Value Ref Range   Glucose-Capillary 147 (H) 70 - 99 mg/dL    Comment: Glucose reference range applies only to samples taken after fasting for at least 8 hours.  Glucose, capillary     Status: Abnormal   Collection Time: 03/03/20  3:43 PM  Result Value Ref Range   Glucose-Capillary 105 (H) 70 - 99 mg/dL    Comment: Glucose reference range applies only to samples taken after fasting for at least 8 hours.  Glucose, capillary     Status: Abnormal   Collection Time: 03/03/20  7:30 PM  Result Value Ref Range   Glucose-Capillary 139 (H) 70 - 99 mg/dL    Comment: Glucose reference range applies only to samples taken after fasting for at least 8 hours.  Glucose, capillary     Status: Abnormal   Collection Time: 03/03/20 11:20 PM  Result Value Ref Range   Glucose-Capillary 120 (H) 70 - 99 mg/dL    Comment: Glucose reference range applies only to samples taken after fasting for at least  8 hours.  Glucose, capillary  Status: Abnormal   Collection Time: 03/04/20  3:11 AM  Result Value Ref Range   Glucose-Capillary 143 (H) 70 - 99 mg/dL    Comment: Glucose reference range applies only to samples taken after fasting for at least 8 hours.  Glucose, capillary     Status: Abnormal   Collection Time: 03/04/20  8:08 AM  Result Value Ref Range   Glucose-Capillary 124 (H) 70 - 99 mg/dL    Comment: Glucose reference range applies only to samples taken after fasting for at least 8 hours.    Recent Results (from the past 240 hour(s))  Culture, blood (Routine X 2) w Reflex to ID Panel     Status: None   Collection Time: 02/26/20  4:37 PM   Specimen: BLOOD LEFT HAND  Result Value Ref Range Status   Specimen Description BLOOD LEFT HAND  Final   Special Requests   Final    BOTTLES DRAWN AEROBIC ONLY Blood Culture results may not be optimal due to an inadequate volume of blood received in culture bottles   Culture   Final    NO GROWTH 5 DAYS Performed at [facility], [place] [address] [place], [place] [address]    Report Status 03/02/2020 FINAL  Final  Culture, blood (Routine X 2) w Reflex to ID Panel     Status: None   Collection Time: 02/26/20  4:37 PM   Specimen: BLOOD LEFT HAND  Result Value Ref Range Status   Specimen Description BLOOD LEFT HAND  Final   Special Requests   Final    BOTTLES DRAWN AEROBIC ONLY Blood Culture results may not be optimal due to an inadequate volume of blood received in culture bottles   Culture   Final    NO GROWTH 5 DAYS Performed at [facility], [place] [address] [place], [place] [address]    Report Status 03/02/2020 FINAL  Final  MRSA PCR Screening     Status: None   Collection Time: 02/27/20  6:03 AM   Specimen: Nasal Mucosa; Nasopharyngeal  Result Value Ref Range Status   MRSA by PCR NEGATIVE NEGATIVE Final    Comment:        The GeneXpert MRSA Assay (FDA approved for NASAL specimens only), is one component of  a comprehensive MRSA colonization surveillance program. It is not intended to diagnose MRSA infection nor to guide or monitor treatment for MRSA infections. Performed at [facility], [place] [address], [place],  [address]     Lipid Panel No results for input(s): CHOL, TRIG, HDL, CHOLHDL, VLDL, LDLCALC in the last 72 hours.  Studies/Results: No results found.  Medications:  Scheduled: . chlorhexidine gluconate (MEDLINE KIT)  15 mL Mouth Rinse BID  . Chlorhexidine Gluconate Cloth  6 each Topical Daily  . famotidine  20 mg Per Tube BID  . feeding supplement (PROSource TF)  45 mL Per Tube TID  . free water  200 mL Per Tube Q8H  . influenza vac split quadrivalent PF  0.5 mL Intramuscular Tomorrow-1000  . insulin aspart  0-6 Units Subcutaneous Q4H  . lacosamide  200 mg Per Tube BID  . levETIRAcetam  1,500 mg Per Tube BID  . mouth rinse  15 mL Mouth Rinse 10 times per day  . metoprolol tartrate  25 mg Per Tube BID  . phenytoin (DILANTIN) IV  75 mg Intravenous Q8H  . pneumococcal 23 valent vaccine  0.5 mL Intramuscular Tomorrow-1000   Continuous: . sodium chloride  Stopped (02/28/20 0203)  . sodium chloride    . feeding supplement (OSMOLITE 1.5 CAL) 50 mL/hr at 03/04/20 0400    Assessment/Recommendations:59 y.o.malewith history of CAD, COPD,opioid dependencyand tobacco usead[facility] with [facility], hypoxia, hypotension and was intubated (extubated 02/17/20). Hospital course at [facility] complicated by Staph Epi bacteremia, AKI, cardiology consult for SVT (probable rapid afib per Dr Domenic Polite), fluid overload, EF 45-50% by echo, elevated LFTs, previous Rt MCA infarct by CT 02/18/20, seizure activity noted 02/22/20, smallSAHon CT (NS consult Dr Duffy Rhody), EEG 11/10, Neurology consult 11/10 Dr [name] ->Keppra, recurrent seizures - tx'd to [facility] 02/25/20.[place]- right frontoparietal region, source unclear - needs to rule out endocarditis  Acute  subarachnoid hemorrhage and old strokes  CT Head - 11/12 -Stable small volume frontal and parietal subarachnoid hemorrhage.Unchanged right temporoparietal encephalomalacia remained to remote cortical infarct.   CT head -11/11 -Stable subarachnoid hemorrhage in the right anterior mid parietal lobe regions.  CTA head and neck:Unchanged small volume subarachnoid hemorrhage over the right convexity and at the right frontal pole. No intracranial arterial occlusion or high-grade stenosis. 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  MRI headwith and without contrast:Small amount of subarachnoid hemorrhage over the right frontal pole and superior right parietal lobe. Areas of leptomeningeal contrast enhancement in the leftcerebellum and within the superior cerebral hemispheres, favored to be due to late subacute to chronic ischemia. Advanced atrophy and chronic microvascular disease. 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  2D Echo- [id number] to 50%. No cardiac source of emboli identified.  VTE prophylaxis -SCDs  Aspirin 81 mg daily and clopidogrel 75 mg dailyprior to admission, now on no antithrombotic.  Code status -Full code  Other Stroke Risk Factors:Advanced age,Hx stroke/TIA- right parietal old infarct on imaging,Coronary artery diseaseon DAPT PTA,Hx of substanceabuse  Seizure   11/10 episode of right sided shaking followed by right sided hemiplegia.11/12 overnight more episode of right sided tonic clonic activity, and more altered.EEG - 11/14showed continuous slow, generalized.No seizures or epileptiform discharges were seen throughout the recording. Was loaded with keppra and keppra 522m Q8h->10035mQ8h. Vimpat 20060midalso started. Overnight Sun-Mon 11/15 the patient exhibited intermittent LUE twitching concerning for seizure recurrence. Reintubated overnight (11/15) for airway protection due to seizure activity concerning for possible status epilepticus, with associated  obtundation and unresponsiveness between bouts of seizure activity. Of note, the seizure acitivity resolved after Versed for intubation. LTM EEG was started. Versed had been stopped and on Monday 11/15 thepatienthad a second recurrence of seizure like activitywith downward gaze and left arm twitchingat about 7:50 AM.Per Dr. YadHortense RamalEG revealed right sided epileptiform discharges. Fosphenytoin load was ordered and Dilantinscheduled dosing started at100 mg TID.  Now on Keppra, Vimpat and Dilantin.Dilantin level was therapeutic at19.1 on 11/18.  Continuous EEG off sedationfor 24 hours (11/17-11/18) showed generalized continuous slowing without seizures ordefinite epileptiform discharges.Thestudywassuggestive ofmoderate tosevere diffuse encephalopathy.EEG appeared to be improving compared to previous day. LTM EEG has been discontinued.  Continues off sedation. 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   Prognosis  - He is significantly improved today (11/21). Now easily arousable, tracking visually and following some commands.   - Given the improvement, if he is extubatable, he may regain enough neurological function to be a rehab candidate.   CODE STATUS - pt is DNR.  35 minutes spent in the neurological evaluation and management of this critically ill patient.    LOS: 19 days   '@Electronically'  signed: Dr. EriKerney Elbe/21/2021  11:13 AM

## 2020-03-04 NOTE — Progress Notes (Signed)
NAME:  Antonio Ramirez, MRN:  [id number], DOB:  22-Nov-1960, LOS: 69 ADMISSION DATE:  02/14/2020, CONSULTATION DATE:  02/27/20 REFERRING MD: Marlowe Sax CHIEF COMPLAINT:  Acute encephalopathy, seizures, status epilepticus  Brief History   59 year old man with COPD, hx of Drug abuse, admitted with hypercarbic respiratory failure, intubated in the ER11/2, not responsive to narcan.     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 No evidence of interval hemorrhage. No abnormal mass effect or midline shift. Remote right parietotemporal infarct again noted.  EEG 11/16 > two seizures from right frontocentral region. EEG 11/18 > mod to severe diffuse encephalopathy.  Micro Data:  Urine 11/2  > neg  BC x 2   11/2 >>>staph epidermidis BC x 2  11/4 >>> MRSA PCR  11/3  Neg   Antimicrobials:  maxepime  11/2 x1 Flagyl  11/2 -11/3 Vanc  11/2x1 Ancef 11/3 >>>11/7  Interim history/subjective:  Patient opens eyes and follow few simple commands, he is tolerating pressure support trial currently  Objective   Blood pressure 127/86, pulse (!) 109, temperature 98.2 F (36.8 C), temperature source Axillary, resp. rate 16, height 6' (1.829 m), weight 69.5 kg, SpO2 95 %.    Vent Mode: PSV FiO2 (%):  [40 %] 40 % Set  Rate:  [16 bmp] 16 bmp Vt Set:  [620 mL] 620 mL PEEP:  [5 cmH20] 5 cmH20 Pressure Support:  [8 TDV76-16 cmH20] 8 cmH20 Plateau Pressure:  [14 cmH20-22 cmH20] 14 cmH20   Intake/Output Summary (Last 24 hours) at 03/04/2020 1209 Last data filed at 03/04/2020 0800 Gross per 24 hour  Intake 1000 ml  Output 2600 ml  Net -1600 ml   Filed Weights   02/29/20 0428 03/02/20 0500 03/04/20 0500  Weight: 77.5 kg 73 kg 69.5 kg    Examination: General: Middle-age cachectic male, orally intubated, lying on the bed HEENT: Atraumatic, normocephalic, ETT and OGT in place Neuro: Opens eyes, tracks examiner but does not follow commands CV: Regular rate and rhythm, no murmur PULM: Faint wheezes bilaterally, no crackles GI: soft, bsx4 active  Extremities: warm/dry,  edema  Skin: no rashes or lesions  Assessment & Plan:   Status epilepticus, now resolved EEG showed no more seizures Neurology input is appreciated, recommend continuing Keppra, Vimpat and phenytoin  He is off EEG  Acute acute hypoxemic/ hypercarbic respiratory failure due to Acute exacerbation of severe COPD Today patient is tolerating pressure support trial, spoke with patient's significant other about extubation but unfortunately if he fails then what we should do as patient does not want tracheostomy or long-term dependent on ventilator She requested if we can hold off on extubation until Tuesday when we have meeting with palliative care then she can decide, I think it is  reasonable to wait until Tuesday as this will determine future care Avoid sedation Continue BD's.  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 Supportive care.  PAF - currently in NSR. Continue beta-blocker.  LUL nodule. CT chest once stabilized and over acute issues.  Hypernatremia, improved Continue Free water 200mg  q8hrs. Follow BMP.  Hypophosphatemia. Continue  electrolyte supplement  Best practice:  Diet: Tube feedings. Pain/Anxiety/Delirium protocol (if indicated): None. VAP protocol (if indicated): bundle in place.  DVT prophylaxis:  lovenox. GI prophylaxis: pepcid. Glucose control: ISS prn. Mobility: bedrest. Code Status: DNR Family Communication: Patient's significant other was updated about his condition. Disposition: ICU.  Total critical care time: 45 minutes  Performed by: [place] care time was exclusive of separately billable procedures and treating other patients.   Critical care was necessary to treat or prevent imminent or life-threatening deterioration.   Critical care was time spent personally by me on the following activities: development of treatment plan with patient and/or surrogate as well as nursing, discussions with consultants, evaluation of patient's response to treatment, examination of patient, obtaining history from patient or surrogate, ordering and performing treatments and interventions, ordering and review of laboratory studies, ordering and review of radiographic studies, pulse oximetry and re-evaluation of patient's condition.   Jacky Kindle MD Critical care physician [place] [facility]  Pager: [phone] Mobile: [phone]

## 2020-03-05 ENCOUNTER — Inpatient Hospital Stay (HOSPITAL_COMMUNITY): Payer: Medicare Other

## 2020-03-05 DIAGNOSIS — R4182 Altered mental status, unspecified: Secondary | ICD-10-CM | POA: Diagnosis not present

## 2020-03-05 DIAGNOSIS — R569 Unspecified convulsions: Secondary | ICD-10-CM | POA: Diagnosis not present

## 2020-03-05 DIAGNOSIS — J441 Chronic obstructive pulmonary disease with (acute) exacerbation: Secondary | ICD-10-CM | POA: Diagnosis not present

## 2020-03-05 DIAGNOSIS — I609 Nontraumatic subarachnoid hemorrhage, unspecified: Secondary | ICD-10-CM | POA: Diagnosis not present

## 2020-03-05 LAB — GLUCOSE, CAPILLARY
Glucose-Capillary: 130 mg/dL — ABNORMAL HIGH (ref 70–99)
Glucose-Capillary: 130 mg/dL — ABNORMAL HIGH (ref 70–99)
Glucose-Capillary: 144 mg/dL — ABNORMAL HIGH (ref 70–99)
Glucose-Capillary: 128 mg/dL — ABNORMAL HIGH (ref 70–99)
Glucose-Capillary: 127 mg/dL — ABNORMAL HIGH (ref 70–99)
Glucose-Capillary: 139 mg/dL — ABNORMAL HIGH (ref 70–99)
Glucose-Capillary: 95 mg/dL (ref 70–99)

## 2020-03-05 MED ORDER — LEVOFLOXACIN IN D5W 750 MG/150ML IV SOLN
750.0000 mg | INTRAVENOUS | Status: DC
  Administered 2020-03-05 – 2020-03-06 (×2): 750 mg via INTRAVENOUS
  Filled 2020-03-05 (×3): qty 150

## 2020-03-05 MED ORDER — SENNA 8.6 MG PO TABS
2.0000 | ORAL_TABLET | Freq: Every day | ORAL | Status: DC
  Administered 2020-03-05 – 2020-03-06 (×2): 17.2 mg
  Filled 2020-03-05 (×2): qty 2

## 2020-03-05 MED ORDER — LORAZEPAM 2 MG/ML IJ SOLN
INTRAMUSCULAR | Status: AC
  Administered 2020-03-05: 4 mg
  Filled 2020-03-05: qty 2

## 2020-03-05 MED ORDER — PHENYTOIN SODIUM 50 MG/ML IJ SOLN
100.0000 mg | Freq: Three times a day (TID) | INTRAMUSCULAR | Status: DC
  Administered 2020-03-05 – 2020-03-06 (×3): 100 mg via INTRAVENOUS
  Filled 2020-03-05 (×3): qty 2

## 2020-03-05 MED ORDER — POLYETHYLENE GLYCOL 3350 17 G PO PACK
17.0000 g | PACK | Freq: Every day | ORAL | Status: DC
  Administered 2020-03-05 – 2020-03-06 (×2): 17 g
  Filled 2020-03-05 (×2): qty 1

## 2020-03-05 NOTE — Progress Notes (Signed)
Nutrition Follow-up  DOCUMENTATION CODES:   Not applicable  INTERVENTION:   Tube feeding via OG tube: Osmolite 1.5 at 20 ml/h and increase by 10 ml every 8 hours to goal rate of 50 (1200 ml per day) Prosource TF 45 ml TID  Provides 1920 kcal, 108 gm protein, 914 ml free water daily  200 ml free water every 8 hours Total free water: 1514 ml   NUTRITION DIAGNOSIS:   Inadequate oral intake related to inability to eat as evidenced by NPO status. Ongoing.   GOAL:   Patient will meet greater than or equal to 90% of their needs  Met.   MONITOR:   Labs, I & O's, Vent status, Diet advancement, Weight trends  REASON FOR ASSESSMENT:   Ventilator    ASSESSMENT:   59 year old male with history significant for chronic back pain, opiate dependence, COPD, CAD s/p bypass surgery presented with AMS after presenting the day prior with same then discharged home after AMS resolved and at baseline. Pt returned with decline in level of consciousness, work-up with significant respiratory acidosis, O2 sats 90% on 4L and placed on 15 L NRB. UDS positive for opiates and cannabinoids s/p  Narcan without improvement and required intubation for acute hypoxic and hypercarbic respiratory failure.  Pt discussed during ICU rounds and with RN.  Plan for palliative care meeting tomorrow, 11/23  11/2-11/6 intubated 11/10 not following commands 11/12 persistent seizures  11/13 transferred to [facility] for continuous EEG 11/15 developed status epilepticus; intubated for airway protection   Patient is currently intubated on ventilator support MV: 9.7 L/min Temp (24hrs), Avg:99.6 F (37.6 C), Min:98.5 F (36.9 C), Max:100.8 F (38.2 C)  Medications and labs reviewed  Diet Order:   Diet Order            Diet NPO time specified  Diet effective now                 EDUCATION NEEDS:   No education needs have been identified at this time  Skin:  Skin Assessment: Skin Integrity Issues: Skin  Integrity Issues:: DTI DTI: sacrum  Last BM:  11/16  Height:   Ht Readings from Last 1 Encounters:  02/27/20 6' (1.829 m)    Weight:   Wt Readings from Last 1 Encounters:  03/05/20 67.9 kg    Ideal Body Weight:     BMI:  Body mass index is 20.3 kg/m.  Estimated Nutritional Needs:   Kcal:  1900  Protein:  100-115 grams  Fluid:  > 2 L/day  Lockie Pares., RD, LDN, CNSC See AMiON for contact information

## 2020-03-05 NOTE — Progress Notes (Signed)
Neurology Progress Note  S: pt can not participate in subjective due to mental status. Per RN, his jaw was twitching about 2 hours ago, and he received 60m Ativan about 0830.  Neurologist spoke to PCCM MD. Plan is for family meeting on Tues with PCCM and palliative care. If patient can not be extubated, then SO may elect comfort care because "living on a machine or having a trach" is not what pt would want.    O: Current vital signs: BP (!) 87/76   Pulse (!) 126   Temp (!) 100.8 F (38.2 C) (Axillary)   Resp 18   Ht 6' (1.829 m)   Wt 67.9 kg   SpO2 95%   BMI 20.30 kg/m  Vital signs in last 24 hours: Temp:  [98.5 F (36.9 C)-100.8 F (38.2 C)] 100.8 F (38.2 C) (11/22 0400) Pulse Rate:  [38-126] 126 (11/22 1000) Resp:  [16-33] 18 (11/22 1000) BP: (78-148)/(51-93) 87/76 (11/22 1000) SpO2:  [91 %-100 %] 95 % (11/22 1000) FiO2 (%):  [40 %] 40 % (11/22 0912) Weight:  [67.9 kg] 67.9 kg (11/22 0500) GENERAL: eyes closed. Intubated.  HEENT: Normocephalic and atraumatic LUNGS: on ventilator CV: RRR on tele.  Ext: warm, well perfused NEURO:  Mental Status: GCS: Best eye opening 1                                    Best motor response 1                                    Best verbal response 1     Language: non verbal, intubated. Cranial Nerves:  II PERRL 4 mm/brisk. No blink to threat. Closes eyes to saline irrigation.  III, IV, VI- eyes midline, no nystagmus.  VI, VII-No corneal reflex. Furls brow to stimulation.  VIII-no response to voice or loud clapping IX, X-intubated XI-head midline XII-intubated Motor: grimaces to noxious stimuli. No response to pinch or sharp stimulation.  Sensory-no response to stimulation.  DTRs-2+ to UEs. 0 to LEs Plantar-wiggles toes to plantar stimulation.  Cerebellar-no tremor Gait-deferred.   Medications  Current Facility-Administered Medications:  .  0.9 %  sodium chloride infusion, 250 mL, Intravenous, Continuous, GCollier Bullock MD,  Stopped at 02/28/20 0203 .  [CANCELED] Place/Maintain arterial line, , , Until Discontinued **AND** 0.9 %  sodium chloride infusion, , Intra-arterial, PRN, GCollier Bullock MD .  acetaminophen (TYLENOL) tablet 650 mg, 650 mg, Oral, Q6H PRN, 650 mg at 03/05/20 0346 **OR** acetaminophen (TYLENOL) suppository 650 mg, 650 mg, Rectal, Q6H PRN, Emokpae, Ejiroghene E, MD, 650 mg at 02/23/20 0443 .  chlorhexidine gluconate (MEDLINE KIT) (PERIDEX) 0.12 % solution 15 mL, 15 mL, Mouth Rinse, BID, GCollier Bullock MD, 15 mL at 03/05/20 0808 .  Chlorhexidine Gluconate Cloth 2 % PADS 6 each, 6 each, Topical, Daily, SAnders Simmonds MD, 6 each at 03/04/20 2141 .  diphenhydrAMINE (BENADRYL) injection 25 mg, 25 mg, Intravenous, Q6H PRN, Emokpae, Courage, MD .  famotidine (PEPCID) 40 MG/5ML suspension 20 mg, 20 mg, Per Tube, BID, Agarwala, Ravi, MD, 20 mg at 03/05/20 0943 .  feeding supplement (OSMOLITE 1.5 CAL) liquid 1,000 mL, 1,000 mL, Per Tube, Continuous, Agarwala, Ravi, MD, Last Rate: 50 mL/hr at 03/05/20 0600, Rate Verify at 03/05/20 0600 .  feeding supplement (PROSource TF) liquid 45 mL, 45 mL,  Per Tube, TID, Kipp Brood, MD, 45 mL at 03/05/20 0936 .  fentaNYL (SUBLIMAZE) injection 50-100 mcg, 50-100 mcg, Intravenous, Q1H PRN, Jacky Kindle, MD, 100 mcg at 03/05/20 0116 .  free water 200 mL, 200 mL, Per Tube, Q8H, Desai, Rahul P, PA-C, 200 mL at 03/05/20 0515 .  influenza vac split quadrivalent PF (FLUARIX) injection 0.5 mL, 0.5 mL, Intramuscular, Tomorrow-1000, Emokpae, Ejiroghene E, MD .  insulin aspart (novoLOG) injection 0-6 Units, 0-6 Units, Subcutaneous, Q4H, Anders Simmonds, MD, 1 Units at 03/04/20 0809 .  lacosamide (VIMPAT) tablet 200 mg, 200 mg, Per Tube, BID, Desai, Rahul P, PA-C, 200 mg at 03/05/20 1002 .  levalbuterol (XOPENEX) nebulizer solution 0.63 mg, 0.63 mg, Nebulization, Q6H PRN, Collene Gobble, MD .  levETIRAcetam (KEPPRA) 100 MG/ML solution 1,500 mg, 1,500 mg, Per Tube, BID, Desai,  Rahul P, PA-C, 1,500 mg at 03/05/20 0936 .  levofloxacin (LEVAQUIN) IVPB 750 mg, 750 mg, Intravenous, Q24H, Chand, Sudham, MD, Last Rate: 100 mL/hr at 03/05/20 1013, 750 mg at 03/05/20 1013 .  LORazepam (ATIVAN) injection 0.5 mg, 0.5 mg, Intravenous, Q8H PRN, Emokpae, Courage, MD, 0.5 mg at 02/27/20 0352 .  MEDLINE mouth rinse, 15 mL, Mouth Rinse, 10 times per day, Collier Bullock, MD, 15 mL at 03/05/20 0937 .  metoprolol tartrate (LOPRESSOR) tablet 25 mg, 25 mg, Per Tube, BID, Collene Gobble, MD, 25 mg at 03/05/20 0936 .  ondansetron (ZOFRAN) tablet 4 mg, 4 mg, Oral, Q6H PRN **OR** ondansetron (ZOFRAN) injection 4 mg, 4 mg, Intravenous, Q6H PRN, Emokpae, Ejiroghene E, MD .  phenytoin (DILANTIN) injection 100 mg, 100 mg, Intravenous, Q8H, Koreen Lizaola, MD .  pneumococcal 23 valent vaccine (PNEUMOVAX-23) injection 0.5 mL, 0.5 mL, Intramuscular, Tomorrow-1000, Emokpae, Ejiroghene E, MD .  polyethylene glycol (MIRALAX / GLYCOLAX) packet 17 g, 17 g, Per Tube, Daily, Jacky Kindle, MD, 17 g at 03/05/20 0936 .  senna (SENOKOT) tablet 17.2 mg, 2 tablet, Per Tube, Daily, Chand, Currie Paris, MD, 17.2 mg at 03/05/20 0937 Labs CBC    Component Value Date/Time   WBC 20.7 (H) 03/03/2020 0428   RBC 6.03 (H) 03/03/2020 0428   HGB 16.2 03/03/2020 0428   HCT 52.3 (H) 03/03/2020 0428   PLT 181 03/03/2020 0428   MCV 86.7 03/03/2020 0428   MCH 26.9 03/03/2020 0428   MCHC 31.0 03/03/2020 0428   RDW 22.4 (H) 03/03/2020 0428   LYMPHSABS 0.8 03/02/2020 0343   MONOABS 1.9 (H) 03/02/2020 0343   EOSABS 0.0 03/02/2020 0343   BASOSABS 0.0 03/02/2020 0343    CMP     Component Value Date/Time   NA 145 03/03/2020 0428   K 3.7 03/03/2020 0428   CL 105 03/03/2020 0428   CO2 32 03/03/2020 0428   GLUCOSE 135 (H) 03/03/2020 0428   BUN 27 (H) 03/03/2020 0428   CREATININE 0.82 03/03/2020 0428   CALCIUM 11.8 (H) 03/03/2020 0428   PROT 5.9 (L) 03/02/2020 0343   ALBUMIN 2.5 (L) 03/02/2020 0343   AST 25 03/02/2020  0343   ALT 29 03/02/2020 0343   ALKPHOS 72 03/02/2020 0343   BILITOT 1.1 03/02/2020 0343   GFRNONAA >60 03/03/2020 0428   GFRAA 50 (L) 12/16/2019 1800    glycosylated hemoglobin  Lipid Panel     Component Value Date/Time   CHOL 118 02/27/2020 1038   TRIG 110 02/27/2020 1038   HDL 39 (L) 02/27/2020 1038   CHOLHDL 3.0 02/27/2020 1038   VLDL 22 02/27/2020 1038   LDLCALC 57 02/27/2020  1038     Imaging I have reviewed images in epic and the results pertinent to this consultation are: CT Head showed stable SAH. No other abnormality. MRI Brain showed IMPRESSION: 1. Small amount of subarachnoid hemorrhage over the right frontal pole and superior right parietal lobe. 2. 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 3. Advanced atrophy and chronic microvascular disease. 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    Assessment: 59 yo male with a hx of COPD, Cor Pulmonale, opioid dependency, tobacco use who was admitted with AMS, hypoxia, hypotension and was intubated. He failed extubation on 02/17/20. Hospital course at [facility] complicated by Staph Epi bacteremia, AKI, SVT and Afib. Previous right MCA infarct by CT, seizure activity noted on 11/10. Small SAH noted on CT and NSU was consulted. Keppra was started for seizures and pt was transferred to [facility]. EEG 11/4 showed continuous slow, generalized activity but no Seizure activity. SZ activity stopped after Versed was used for intubation. On Monday 11/15 pt had a 2nd recurrence of seizure like activity with downwasd gaze and left arm twitching. EEG at that time revealed right sided epileptiform discharged. Fosphenytoin oad was given and Dilantin scheduled TID. Pt remains on Dilantin, Keppra, and Vimpat.  Jaw movement (? Seizure activity) was noted again today. Ativan was given. Dilantin was changed to 152m tid.    Recommendations: 1. Recurrent seizure like activity-Dilantin changed to 1070mIV tid.  2. SAH-watching.  3. Discussion with PCCM this a.m.-family mtg tomorrow to discuss. Extubation will be attempted in a.m. SO is considering comfort care if pt fails extubation as pt does not want to live on a machine and does not want a trach. Per PCCM, extubation is unlikely due to cor pulmonale and COPD.   Pt seen by KaClance BollNP and Dr. KhLorrin GoodellNote reviewed and edited by Dr.    KaClance BollMSN, APN-BC/Nurse Practitioner Pager: [phone] NEUROHOSPITALIST ADDENDUM Performed a face to face diagnostic evaluation.   I have reviewed the contents of history and physical exam as documented by PA/ARNP/Resident and agree with above documentation.  I have discussed and formulated the above plan as documented. Edits to the note have been made as needed.  Impression: 608 with R SAH and seizures. He was improving overall, today had a brief episode of Jaw twitching and concern for seizure which resolved with Ativan. We will increase PHT to 10041mID and monitor. PCCM in discussion with family regarding advanced COPD and Cor pulmonale and maybe difficult to extubate. Further conversation regarding [place] family on Tuesday. We will be available for help as needed. Key exam findings: obtunded mentation, no twitching, has intact brainstem reflexes. Plan: Clinical monitoring for seizures and may consider cEEG if concern for potential status. Continue Dilantin 100m7mD.  SalmDonnetta Simpers [facility] [phone] 7pm to 7am, please call on call as listed on AMION.

## 2020-03-05 NOTE — Consult Note (Signed)
WOC Nurse Consult Note: Patient receiving care in [facility] 4N25.  Assisted with turning by NT. The gluteal fold apex wound is now moist and with a small amount of slough. I have changed the orders for the area to: Cut a narrow strip of Aquacel Kellie Simmering [phone]) and place it over the wound in the gluteal fold. Cover with a foam dressing. Change daily. Today the wound measures 2 cm x 0.4 cm x unknown depth. Val Riles, RN, MSN, CWOCN, CNS-BC, pager [phone]

## 2020-03-05 NOTE — Progress Notes (Signed)
NAME:  Antonio Ramirez, MRN:  [id number], DOB:  [birth year], LOS: 11 ADMISSION DATE:  02/14/2020, CONSULTATION DATE:  02/27/20 REFERRING MD: Marlowe Sax CHIEF COMPLAINT:  Acute encephalopathy, seizures, status epilepticus  Brief History   58 year old man with COPD, hx of Drug abuse, admitted with hypercarbic respiratory failure, intubated in the ER11/2, not responsive to narcan.     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 No evidence of interval hemorrhage. No abnormal mass effect or midline shift. Remote right parietotemporal infarct again noted.  EEG 11/16 > two seizures from right frontocentral region. EEG 11/18 > mod to severe diffuse encephalopathy.  Micro Data:  Urine 11/2  > neg  BC x 2   11/2 >>>staph epidermidis BC x 2  11/4 >>> MRSA PCR  11/3  Neg   Antimicrobials:  maxepime  11/2 x1 Flagyl  11/2 -11/3 Vanc  11/2x1 Ancef 11/3 >>>11/7  Interim history/subjective:  This morning patient was placed on pressure support trial, followed by he started with decreased responsiveness and rhythmic movement of his right side of the jaw and right upper extremity, received 4 mg of Ativan with improvement.  Objective   Blood pressure (!) 87/76, pulse (!) 126, temperature (!) 100.8 F (38.2 C),  temperature source Axillary, resp. rate 18, height 6' (1.829 m), weight 67.9 kg, SpO2 95 %.    Vent Mode: PRVC FiO2 (%):  [40 %] 40 % Set Rate:  [16 bmp] 16 bmp Vt Set:  [520 mL-620 mL] 520 mL PEEP:  [5 cmH20] 5 cmH20 Pressure Support:  [5 cmH20-10 cmH20] 10 cmH20 Plateau Pressure:  [18 cmH20] 18 cmH20   Intake/Output Summary (Last 24 hours) at 03/05/2020 1040 Last data filed at 03/05/2020 0900 Gross per 24 hour  Intake 1766.67 ml  Output 2700 ml  Net -933.33 ml   Filed Weights   03/02/20 0500 03/04/20 0500 03/05/20 0500  Weight: 73 kg 69.5 kg 67.9 kg    Examination: General: Middle-age cachectic male, orally intubated, lying on the bed HEENT: Atraumatic, normocephalic, ETT and OGT in place Neuro: Eyes closed, not following commands.  Right jaw and right upper extremity rhythmic movement noted  CV: Regular rate and rhythm, no murmur PULM: Faint wheezes bilaterally, no crackles GI: soft, bsx4 active  Extremities: warm/dry,  edema  Skin: no rashes or lesions  Assessment & Plan:   Status epilepticus EEG showed no more seizures This morning patient started with rhythmic right-sided jaw and right upper extremity rhythmic movement, received 4 mg of IV Ativan with improvement Spoke with neurologist, recommend increasing phenytoin 200 mg every 8 Continue Keppra and Vimpat  Acute acute hypoxemic/ hypercarbic respiratory failure due to Acute exacerbation of severe COPD HCAP Patient has been tolerating pressure support trial, he has thick  copious amount of greenish secretions likely due to HCAP Respiratory culture has been sent Started on IV levofloxacin He has been tolerating pressure support trial, awaiting family meeting tomorrow regarding goals of care discussion before patient can be safely extubated as patient does not want to be vent dependent and long run Avoid sedation Continue BD's.  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 Supportive care.  PAF - currently in NSR. Continue beta-blocker.  LUL nodule. CT chest once stabilized and over acute issues.  Hypernatremia, improved Continue Free water 200mg  q8hrs. Repeat BMP in the morning  Hypophosphatemia. Continue electrolyte supplement, repeat electrolytes in the morning  Best practice:  Diet: Tube feedings. Pain/Anxiety/Delirium protocol (if indicated): As needed fentanyl VAP protocol (if indicated): bundle in place.  DVT prophylaxis:  lovenox. GI prophylaxis: pepcid. Glucose control: ISS prn. Mobility: bedrest. Code Status: DNR Family Communication: Patient's significant other was updated about his condition. Disposition: ICU.  Total critical care time: 49 minutes  Performed by: [place] care time was exclusive of separately billable procedures and treating other patients.   Critical care was necessary to treat or prevent imminent or life-threatening deterioration.   Critical care was time spent personally by me on the following activities: development of treatment plan with patient and/or surrogate as well as nursing, discussions with consultants, evaluation of patient's response to treatment, examination of patient, obtaining history from patient or surrogate, ordering and performing treatments and interventions, ordering and review of laboratory studies, ordering and review of radiographic studies, pulse oximetry and re-evaluation of patient's condition.   Jacky Kindle MD Critical care physician [place] [facility]  Pager: [phone] Mobile: [phone]

## 2020-03-05 NOTE — Progress Notes (Signed)
Obtained sputum culture per MD order.  Taken to lab

## 2020-03-05 NOTE — Progress Notes (Signed)
Physical Therapy Treatment Patient Details Name: Antonio Ramirez MRN: [id number] DOB: 1960-09-11 Today's Date: 03/05/2020    History of Present Illness pt is a 59 y/o male with PMH significant for opiate dependence, COPD,CAD, CABG, admitted with AMS, found to be hypoxic down into the 70's on RA,  In ED, he was intubated for acute hypoxic and hypercarbic respiratory failue.  Extubated f11/5.  Status deline as of 11/10, decreased responsiveness,11/12 persistent seizures, transferred to [facility] for continuous EEG, 11/13 w/d from stimulit, increasing O2 needs 11/15 developed status epilepticus, intubated for airway protection.    PT Comments    Pt now intubated and not responsive despite being off sedation. Pt with no opening of eyes or active participation in therapy. Pt with no command follow and only grimaces to noxious stimuli. Per RN medical plan may be transitioning to comfort care. Plan to speak with family today. Acute PT to continue to monitor situation to verify acute PT services are still needed.    Follow Up Recommendations  LTACH (pt now intubated, per RN possible going comfort care)     Equipment Recommendations  None recommended by PT    Recommendations for Other Services       Precautions / Restrictions Precautions Precautions: Fall Precaution Comments: intubated  Restrictions Weight Bearing Restrictions: No    Mobility  Bed Mobility Overal bed mobility: Needs Assistance Bed Mobility: Rolling;Sidelying to Sit;Sit to Supine Rolling: Total assist;+2 for physical assistance Sidelying to sit: Total assist;+2 for physical assistance   Sit to supine: Total assist;+2 for physical assistance   General bed mobility comments: pt with no voluntary assist or initiation of transfer, eyes closed through entirity of session, pt sat EOB fully supported posteriorly x 5 min, max assist to maintain head in upright position  Transfers                 General transfer comment:  not attempted  Ambulation/Gait             General Gait Details: not attempted   Stairs             Wheelchair Mobility    Modified Rankin (Stroke Patients Only)       Balance Overall balance assessment: Needs assistance Sitting-balance support: Feet supported;No upper extremity supported Sitting balance-Leahy Scale: Zero Sitting balance - Comments: pt sat EOB x 5 min totally supported posteriorly, completed some passive trunk rotation and scapular retraction                                    Cognition Arousal/Alertness: Lethargic Behavior During Therapy: Flat affect Overall Cognitive Status: Impaired/Different from baseline                                 General Comments: pt unable to arouse, pt off sedation but did receive ativan an hour before PT session due to twitching, pt with no eye opening, no command follow, only grimacing to noxious stimuli      Exercises Other Exercises Other Exercises: PROM to all 4 extremities    General Comments General comments (skin integrity, edema, etc.): PT with ETT      Pertinent Vitals/Pain Pain Assessment: Faces Faces Pain Scale: Hurts little more (grimaced to pinching) Pain Intervention(s): Monitored during session    Home Living  Prior Function            PT Goals (current goals can now be found in the care plan section) Progress towards PT goals: Not progressing toward goals - comment    Frequency    Min 2X/week      PT Plan Discharge plan needs to be updated;Frequency needs to be updated    Co-evaluation              AM-PAC PT "6 Clicks" Mobility   Outcome Measure  Help needed turning from your back to your side while in a flat bed without using bedrails?: Total Help needed moving from lying on your back to sitting on the side of a flat bed without using bedrails?: Total Help needed moving to and from a bed to a chair (including  a wheelchair)?: Total Help needed standing up from a chair using your arms (e.g., wheelchair or bedside chair)?: Total Help needed to walk in hospital room?: Total Help needed climbing 3-5 steps with a railing? : Total 6 Click Score: 6    End of Session Equipment Utilized During Treatment: Oxygen (via vent) Activity Tolerance: Patient tolerated treatment well Patient left: in bed;with call bell/phone within reach;with nursing/sitter in room Nurse Communication: Mobility status PT Visit Diagnosis: Other symptoms and signs involving the nervous system (R29.898)     Time: 3244-0102 PT Time Calculation (min) (ACUTE ONLY): 21 min  Charges:  $Therapeutic Activity: 8-22 mins                     Kittie Plater, PT, DPT Acute Rehabilitation Services Pager #: [phone] Office #: [phone]    Berline Lopes 03/05/2020, 1:02 PM

## 2020-03-06 DIAGNOSIS — J9691 Respiratory failure, unspecified with hypoxia: Secondary | ICD-10-CM

## 2020-03-06 DIAGNOSIS — J9601 Acute respiratory failure with hypoxia: Secondary | ICD-10-CM

## 2020-03-06 DIAGNOSIS — R0609 Other forms of dyspnea: Secondary | ICD-10-CM

## 2020-03-06 DIAGNOSIS — R451 Restlessness and agitation: Secondary | ICD-10-CM

## 2020-03-06 LAB — GLUCOSE, CAPILLARY
Glucose-Capillary: 125 mg/dL — ABNORMAL HIGH (ref 70–99)
Glucose-Capillary: 124 mg/dL — ABNORMAL HIGH (ref 70–99)
Glucose-Capillary: 138 mg/dL — ABNORMAL HIGH (ref 70–99)

## 2020-03-06 MED ORDER — GLYCOPYRROLATE 0.2 MG/ML IJ SOLN
0.4000 mg | Freq: Four times a day (QID) | INTRAMUSCULAR | Status: DC | PRN
  Administered 2020-03-08 – 2020-03-09 (×2): 0.4 mg via INTRAVENOUS
  Filled 2020-03-06 (×3): qty 2

## 2020-03-06 MED ORDER — LEVETIRACETAM IN NACL 1500 MG/100ML IV SOLN: 1500.0000 mg | Freq: Two times a day (BID) | INTRAVENOUS | Status: DC

## 2020-03-06 MED ORDER — LORAZEPAM 2 MG/ML IJ SOLN
2.0000 mg | INTRAMUSCULAR | Status: DC | PRN
  Administered 2020-03-08 – 2020-03-09 (×3): 2 mg via INTRAVENOUS
  Filled 2020-03-06 (×3): qty 1

## 2020-03-06 MED ORDER — LEVETIRACETAM IN NACL 1500 MG/100ML IV SOLN
1500.0000 mg | Freq: Two times a day (BID) | INTRAVENOUS | Status: DC
  Administered 2020-03-06: 1500 mg via INTRAVENOUS
  Filled 2020-03-06 (×2): qty 100

## 2020-03-06 MED ORDER — MORPHINE SULFATE (PF) 2 MG/ML IV SOLN
INTRAVENOUS | Status: AC
  Filled 2020-03-06: qty 1

## 2020-03-06 MED ORDER — PHENYTOIN SODIUM 50 MG/ML IJ SOLN
100.0000 mg | Freq: Three times a day (TID) | INTRAMUSCULAR | Status: DC
  Administered 2020-03-06 – 2020-03-07 (×3): 100 mg via INTRAVENOUS
  Filled 2020-03-06 (×4): qty 2

## 2020-03-06 MED ORDER — SODIUM CHLORIDE 0.9 % IV SOLN
200.0000 mg | Freq: Two times a day (BID) | INTRAVENOUS | Status: DC
  Administered 2020-03-06: 200 mg via INTRAVENOUS
  Filled 2020-03-06 (×3): qty 20

## 2020-03-06 MED ORDER — PHENYTOIN SODIUM 50 MG/ML IJ SOLN: 100.0000 mg | Freq: Three times a day (TID) | INTRAMUSCULAR | Status: DC

## 2020-03-06 MED ORDER — MORPHINE SULFATE (PF) 2 MG/ML IV SOLN
2.0000 mg | INTRAVENOUS | Status: DC | PRN
  Administered 2020-03-06 (×2): 2 mg via INTRAVENOUS
  Filled 2020-03-06 (×2): qty 1

## 2020-03-06 NOTE — Progress Notes (Signed)
Transferred to 4K10; updated significant other and wants to try to take patient home with Hospice.

## 2020-03-06 NOTE — Progress Notes (Signed)
OT Cancellation Note  Patient Details Name: HARVARD ZEISS MRN: [id number] DOB: 1960-05-20   Cancelled Treatment:    Reason Eval/Treat Not Completed: Other (comment); noted pt now with discontinued OT orders - OT acknowledging discontinued orders - will sign off at this time.  Lou Cal, OT Acute Rehabilitation Services Pager [phone] Office [phone]   Raymondo Band 03/06/2020, 2:51 PM

## 2020-03-06 NOTE — Progress Notes (Signed)
Patient ID: Antonio Ramirez, male   DOB: 1961/02/04, 59 y.o.   MRN: [id number]  59 year old man who remains critically ill due to status epilepticus requiring mechanical ventilation for airway protection on a background history of severe COPD with cor pulmonale.  This NP reviewed medical records, received report from team and then visited patient at the bedside  for palliative needs and emotional support, and to meet with patient's significant/HPOA/ Antonio Ramirez along with her mother/Antonio Ramirez  at the bedside for continued conversation regarding current medical situation and next steps in treatment plan.  Patient remains intubated.   Patient is off sedation, patient is  following simple commands and tolerating some trials of ventilator wean.  It is unlikely that the patient will be able to support himself from a respiratory standpoint for any length of time secondary to his underlying comorbidities.  Created space and opportunity for Antonio Ramirez to explore her thoughts and feeling regarding Antonio Ramirez's current medical situation.  She  Understands the seriousness of the current medical situations and the likelihood that patient may not thrive once liberated from the ventilator.  "He does not want to be on machines, he does not want to be like this"   Once again patient's significant other verbalizes that she believes the right thing for the patient is to shift to a comfort path.   Education offered regarding the logistics of one way extubation, and possible trajectories.  Education offered on the difference between an aggressive medical intervention path and a palliative comfort path for this patient, in his current medical situation.   Emotional support offered.  Comfort and dignity are priority.  Plan of care: -DNR/DNI -Liberate patient from vent--focus of care is comfort and dignity -No escalation of care -Symptom management--depending on patient's presentation utilize prn medications,  consider continuous  drip if necessary to ensure comfort -next steps of care dependant on patient outcomes - keep family updated with any changes  Discussed with Dr Tacy Learn and bedside RN/Katy  PMT will continue to support holistically  Questions and concerns addressed     Total time spent on the unit was  70 minutes  Greater than 50% of the time was spent in counseling and coordination of care  Wadie Lessen NP  Palliative Medicine Team Team Phone # [phone] Pager [phone]

## 2020-03-06 NOTE — Procedures (Signed)
Extubation Procedure Note  Patient Details:   Name: Antonio Ramirez DOB: March 09, 1961 MRN: [id number]   Airway Documentation:    Vent end date: 03/06/20 Vent end time: 1245   Evaluation  O2 sats: stable throughout Complications: No apparent complications Patient did tolerate procedure well. Bilateral Breath Sounds: Diminished   No  Gonzella Lex 03/06/2020, 12:53 PM

## 2020-03-06 NOTE — Progress Notes (Signed)
NAME:  Antonio Ramirez, MRN:  [id number], DOB:  19-Jan-1961, LOS: 21 ADMISSION DATE:  02/14/2020, CONSULTATION DATE:  02/27/20 REFERRING MD: Marlowe Sax CHIEF COMPLAINT:  Acute encephalopathy, seizures, status epilepticus  Brief History   59 year old man with COPD, hx of Drug abuse, admitted with hypercarbic respiratory failure, intubated in the ER11/2, not responsive to narcan.     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 No evidence of interval hemorrhage. No abnormal mass effect or midline shift. Remote right parietotemporal infarct again noted.  EEG 11/16 > two seizures from right frontocentral region. EEG 11/18 > mod to severe diffuse encephalopathy.  Micro Data:  Urine 11/2  > neg  BC x 2   11/2 >>>staph epidermidis BC x 2  11/4 >>> MRSA PCR  11/3  Neg   Antimicrobials:  maxepime  11/2 x1 Flagyl  11/2 -11/3 Vanc  11/2x1 Ancef 11/3 >>>11/7  Interim history/subjective:  This morning patient was placed on pressure support trial, alert and well so far, he is following commands.  No more seizure activity  Objective   Blood pressure 128/85, pulse (!) 101, temperature 99.4 F (37.4 C), temperature source Oral, resp. rate (!) 25, height 6' (1.829 m), weight 67.5 kg, SpO2 96 %.    Vent Mode:  PSV;CPAP FiO2 (%):  [40 %] 40 % Set Rate:  [16 bmp] 16 bmp Vt Set:  [520 mL] 520 mL PEEP:  [5 cmH20] 5 cmH20 Pressure Support:  [8 cmH20-10 cmH20] 8 cmH20 Plateau Pressure:  [14 cmH20-15 cmH20] 15 cmH20   Intake/Output Summary (Last 24 hours) at 03/06/2020 0932 Last data filed at 03/06/2020 0600 Gross per 24 hour  Intake 1699.93 ml  Output 1475 ml  Net 224.93 ml   Filed Weights   03/04/20 0500 03/05/20 0500 03/06/20 0500  Weight: 69.5 kg 67.9 kg 67.5 kg    Examination: General: Middle-age cachectic male, orally intubated, lying on the bed HEENT: Atraumatic, normocephalic, ETT and OGT in place Neuro: Opens eyes with vocal stimuli, intermittently following commands.  Pupils 3 mm reactive bilaterally CV: Regular rate and rhythm, no murmur PULM: Clear to auscultation bilaterally, no crackles GI: soft, bsx4 active  Extremities: warm/dry,  edema  Skin: no rashes or lesions  Assessment & Plan:   Status epilepticus EEG showed no more seizures Since yesterday morning no more seizure activity was noted Dilantin was increased 100 mg every 8 hour Continue Keppra and Vimpat  Acute acute hypoxemic/ hypercarbic respiratory failure due to Acute exacerbation of severe COPD HCAP Patient has been tolerating pressure support trial, he has thick copious amount of greenish secretions likely due to HCAP Respiratory culture is growing gram-positive rods, identification and sensitivities are pending Continue IV levofloxacin Today we have a family meeting to discuss  goals of care discussion as patient does not want long-term mechanical ventilation or tracheostomy Avoid sedation Continue BD's.  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 Supportive care.  PAF - currently in NSR. Continue beta-blocker.  LUL nodule. CT chest once stabilized and over acute issues.  Hypernatremia, improved Continue Free  water 200mg  q8hrs.  Hypophosphatemia. Continue electrolyte supplement, repeat electrolytes in the morning  Best practice:  Diet: Tube feedings. Pain/Anxiety/Delirium protocol (if indicated): As needed fentanyl VAP protocol (if indicated): bundle in place.  DVT prophylaxis:  lovenox. GI prophylaxis: pepcid. Glucose control: ISS prn. Mobility: bedrest. Code Status: DNR Family Communication: Patient's significant other was updated about his condition. Disposition: ICU.  Total critical care time: 38 minutes  Performed by: [place] care time was exclusive of separately billable procedures and treating other patients.   Critical care was necessary to treat or prevent imminent or life-threatening deterioration.   Critical care was time spent personally by me on the following activities: development of treatment plan with patient and/or surrogate as well as nursing, discussions with consultants, evaluation of patient's response to treatment, examination of patient, obtaining history from patient or surrogate, ordering and performing treatments and interventions, ordering and review of laboratory studies, ordering and review of radiographic studies, pulse oximetry and re-evaluation of patient's condition.   Jacky Kindle MD Critical care physician [facility]  Pager: [phone] Mobile: [phone]

## 2020-03-06 NOTE — Progress Notes (Signed)
Palliative care team had meeting with patient significant other and her mother, after explaining patient's condition, family opted for DNR/DNI and if he does not do well to proceed with comfort care.  For now he is on IV antiseizure medications which will be continued.    Jacky Kindle MD Critical care physician [facility]  Pager: [phone] Mobile: [phone]

## 2020-03-07 DIAGNOSIS — J9611 Chronic respiratory failure with hypoxia: Secondary | ICD-10-CM

## 2020-03-07 DIAGNOSIS — R0600 Dyspnea, unspecified: Secondary | ICD-10-CM

## 2020-03-07 MED ORDER — MORPHINE SULFATE (PF) 2 MG/ML IV SOLN
2.0000 mg | INTRAVENOUS | Status: DC
  Administered 2020-03-07 – 2020-03-09 (×13): 2 mg via INTRAVENOUS
  Filled 2020-03-07 (×12): qty 1

## 2020-03-07 NOTE — Progress Notes (Signed)
Patient ID: Antonio Ramirez, male   DOB: 1960-09-23, 59 y.o.   MRN: [id number]  59 year old man who remains critically ill due to status epilepticus requiring mechanical ventilation for airway protection on a background history of severe COPD with cor pulmonale.  This NP reviewed medical records, received report from team and then visited patient at the bedside  for palliative needs and emotional support.  Patient was extubated yesterday. Patient is awake but unable to follow commands or to communicate needs. He appears generally uncomfortable.   Placed phone call to significant other and created space and opportunity for Antonio Ramirez to explore her thoughts and feeling regarding [name]'s current medical situation. Antonio Ramirez verbalizes understanding of the patient's continued failure to thrive since extubation and expresses her desire for comfort, quality and dignity at this time focusing on a comfort path.  Patient has made his wishes known in the past, and his significant other wants to honor his wishes. Emotional support offered.  Comfort and dignity are priority.  Education offered on the details of a comfort path for this patient at this time in this situation.  Plan of care: -DNR/DNI -Focus of care is comfort and dignity -No escalation of care -Symptom management--    -Morphine IV 2 mg every 4 hrs, consider continuous drip if necessary to ensure comfort    - Morphine 2 mg every 1 hr prn     -Ativan 2 mg IV every 4 hr prn -family is interested in residetnial hospice for EOL care/[place] -- Prognosis is likely days - keep family updated with any changes  Discussed with Dr Cyndia Skeeters and bedside RN/Ricahrd  PMT will continue to support holistically.        Questions and concerns addressed     Total time spent on the unit was  35 minutes  Greater than 50% of the time was spent in counseling and coordination of care  Wadie Lessen NP  Palliative Medicine Team Team Phone # [phone] Pager [phone]

## 2020-03-07 NOTE — Progress Notes (Signed)
PROGRESS NOTE  Antonio Ramirez JAS:[id number] DOB: 08-Feb-1961   PCP: [name], DO  Patient is from: Home.  DOA: 02/14/2020 LOS: 29   Brief Narrative / Interim history: 59 year old male with history of PAH, CAD/CABG, COPD, systolic CHF with severe RV dysfunction, depression and chronic pain on chronic opiate admitted to [facility] ([facility]) with acute encephalopathy and hypoxic hypercarbic respiratory failure requiring intubation and mechanical ventilation.  He was intubated from 11/2-11/5.  Intermittently required BiPAP after that.  He was noted to have jerking movements on the right side on 11/10 and a started on Keppra.  CT head also showed small volume acute subarachnoid hemorrhage overlying the right parietal and frontal lobes.  He also remained lethargic and thought to be in persistent seizure and sent to [facility] for EEG on 11/12.    On arrival at [facility], he had increased oxygen requirement and remains somnolent only withdrawing response to noxious stimuli.  24-hour EEG on 11/14 did not reveal seizure or epileptiform discharge.  Antiepileptic medications held.  Patient developed status epilepticus on 11/15 that did not respond with IV Ativan.  ECG on 02/25/2020 showed 2 seizures arising from right frontocentral region.  Eventually he was reintubated.  He was restarted on antiepileptic regimen.  Patient was eventually extubated on 03/06/2020 and transitioned to comfort cares.  TRH assumed care on 03/07/2020.  Plan is to transfer to residential hospice in [place] per family request, likely on 03/08/2020.  Subjective: Seen and examined earlier this morning.  No major events overnight of this morning.  Awake.  No complaints but not a great historian.  Objective: Vitals:   03/06/20 2100 03/06/20 2200 03/06/20 2308 03/07/20 0447  BP: 119/83 118/71 110/69 122/78  Pulse: 94 98 81 95  Resp: 20 (!) '23 19 19  ' Temp:   98.7 F (37.1 C) 97.9 F (36.6 C)  TempSrc:   Axillary Oral    SpO2: 92% 95% 93% 94%  Weight:      Height:        Intake/Output Summary (Last 24 hours) at 03/07/2020 1731 Last data filed at 03/07/2020 1604 Gross per 24 hour  Intake 21.89 ml  Output 1400 ml  Net -1378.11 ml   Filed Weights   03/04/20 0500 03/05/20 0500 03/06/20 0500  Weight: 69.5 kg 67.9 kg 67.5 kg    Examination:  GENERAL: No apparent distress.  HEENT: MMM.  Vision and hearing grossly intact.  NECK: Supple.  No apparent JVD.  RESP:  No IWOB.  CVS:  RRR.  MSK/EXT:  Moves extremities. No apparent deformity.  NEURO: Awake.  No gross focal deficits.   Assessment & Plan: End-of-life care/comfort measures only/DNR/DNI -Comfort pathway per palliative medicine. -Plan for transfer to residential hospice in [place] on 03/08/2020.  Status epilepticus-off AED -as needed Ativan at this time as patient is full comfort care  Subarachnoid hemorrhage  Acute metabolic encephalopathy-multifactorial.  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  PAF: Rate controlled.  LUL nodule.  Hypernatremia, improved  Hypophosphatemia.  Inadequate oral intake Body mass index is 20.18 kg/m. Nutrition Problem: Inadequate oral intake Etiology: inability to eat Signs/Symptoms: NPO status Interventions: Refer to RD note for recommendations   Pressure skin injury: Pressure Injury 03/01/20 Sacrum Medial Deep Tissue Pressure Injury - Purple or maroon localized area of  discolored intact skin or blood-filled blister due to damage of underlying soft tissue from pressure and/or shear. Red, purple (Active)  03/01/20 2000  Location: Sacrum  Location Orientation: Medial  Staging: Deep Tissue Pressure Injury - Purple or maroon localized area of discolored intact skin or blood-filled blister due to  damage of underlying soft tissue from pressure and/or shear.  Wound Description (Comments): Red, purple  Present on Admission: No   DVT prophylaxis:  None.  Patient is full comfort care  Code Status: DNR/DNI Family Communication: Patient and/or RN. Available if any question.  Status is: Inpatient  Remains inpatient appropriate because:Unsafe d/c plan   Dispo: The patient is from: Home              Anticipated d/c is to: Residential hospice facility              Anticipated d/c date is: 1 day              Patient currently is medically stable to d/c.       Consultants:  Neurology PCCM Palliative medicine   Sch Meds:  Scheduled Meds: . chlorhexidine gluconate (MEDLINE KIT)  15 mL Mouth Rinse BID  . Chlorhexidine Gluconate Cloth  6 each Topical Daily  . influenza vac split quadrivalent PF  0.5 mL Intramuscular Tomorrow-1000  .  morphine injection  2 mg Intravenous Q4H  . pneumococcal 23 valent vaccine  0.5 mL Intramuscular Tomorrow-1000   Continuous Infusions: . sodium chloride 250 mL (03/06/20 2300)   PRN Meds:.acetaminophen **OR** acetaminophen, glycopyrrolate, LORazepam, morphine injection, ondansetron **OR** ondansetron (ZOFRAN) IV  Antimicrobials: Anti-infectives (From admission, onward)   Start     Dose/Rate Route Frequency Ordered Stop   03/05/20 1000  levofloxacin (LEVAQUIN) IVPB 750 mg  Status:  Discontinued        750 mg 100 mL/hr over 90 Minutes Intravenous Every 24 hours 03/05/20 0836 03/07/20 0919   02/15/20 2200  vancomycin (VANCOCIN) IVPB 1000 mg/200 mL premix  Status:  Discontinued        1,000 mg 200 mL/hr over 60 Minutes Intravenous Every 24 hours 02/14/20 2149 02/15/20 1044   02/15/20 1130  ceFAZolin (ANCEF) IVPB 2g/100 mL premix        2 g 200 mL/hr over 30 Minutes Intravenous Every 8 hours 02/15/20 1044 02/20/20 0133   02/14/20 2200  metroNIDAZOLE (FLAGYL) IVPB 500 mg  Status:  Discontinued        500 mg 100 mL/hr over 60 Minutes Intravenous  Every 8 hours 02/14/20 2136 02/15/20 1433   02/14/20 2200  vancomycin (VANCOREADY) IVPB 1250 mg/250 mL        1,250 mg 166.7 mL/hr over 90 Minutes Intravenous  Once 02/14/20 2148 02/15/20 0128   02/14/20 2200  ceFEPIme (MAXIPIME) 2 g in sodium chloride 0.9 % 100 mL IVPB  Status:  Discontinued        2 g 200 mL/hr over 30 Minutes Intravenous Every 12 hours 02/14/20 2148 02/15/20 1044       I have personally reviewed the following labs and images: CBC: Recent Labs  Lab 03/01/20 0712 03/02/20 0343 03/03/20 0428  WBC 14.5* 22.2* 20.7*  NEUTROABS 12.2* 19.2*  --   HGB 16.7 16.3 16.2  HCT 54.2* 52.1* 52.3*  MCV 86.0 86.7 86.7  PLT 194 167 181   BMP &GFR Recent Labs  Lab 03/01/20 0712 03/02/20 0343 03/03/20 0428  NA 150* 147* 145  K 3.5 3.5 3.7  CL 110 111 105  CO2  32 31 32  GLUCOSE 123* 137* 135*  BUN 34* 26* 27*  CREATININE 0.81 0.68 0.82  CALCIUM 11.6* 11.2* 11.8*  MG 1.9 1.8 2.1  PHOS 2.1* 2.7  --    Estimated Creatinine Clearance: 91.5 mL/min (by C-G formula based on SCr of 0.82 mg/dL). Liver & Pancreas: Recent Labs  Lab 03/01/20 0712 03/02/20 0343  AST 21 25  ALT 28 29  ALKPHOS 67 72  BILITOT 0.9 1.1  PROT 5.8* 5.9*  ALBUMIN 2.6* 2.5*   No results for input(s): LIPASE, AMYLASE in the last 168 hours. No results for input(s): AMMONIA in the last 168 hours. Diabetic: No results for input(s): HGBA1C in the last 72 hours. Recent Labs  Lab 03/05/20 1924 03/05/20 2316 03/06/20 0413 03/06/20 0824 03/06/20 1229  GLUCAP 139* 95 125* 124* 138*   Cardiac Enzymes: No results for input(s): CKTOTAL, CKMB, CKMBINDEX, TROPONINI in the last 168 hours. No results for input(s): PROBNP in the last 8760 hours. Coagulation Profile: No results for input(s): INR, PROTIME in the last 168 hours. Thyroid Function Tests: No results for input(s): TSH, T4TOTAL, FREET4, T3FREE, THYROIDAB in the last 72 hours. Lipid Profile: No results for input(s): CHOL, HDL, LDLCALC, TRIG,  CHOLHDL, LDLDIRECT in the last 72 hours. Anemia Panel: No results for input(s): VITAMINB12, FOLATE, FERRITIN, TIBC, IRON, RETICCTPCT in the last 72 hours. Urine analysis:    Component Value Date/Time   COLORURINE AMBER (A) 02/14/2020 1701   APPEARANCEUR HAZY (A) 02/14/2020 1701   LABSPEC 1.021 02/14/2020 1701   PHURINE 5.0 02/14/2020 1701   GLUCOSEU 50 (A) 02/14/2020 1701   HGBUR LARGE (A) 02/14/2020 1701   BILIRUBINUR NEGATIVE 02/14/2020 1701   KETONESUR NEGATIVE 02/14/2020 1701   PROTEINUR 100 (A) 02/14/2020 1701   NITRITE NEGATIVE 02/14/2020 1701   LEUKOCYTESUR NEGATIVE 02/14/2020 1701   Sepsis Labs: Invalid input(s): PROCALCITONIN, [place]  Microbiology: Recent Results (from the past 240 hour(s))  MRSA PCR Screening     Status: None   Collection Time: 02/27/20  6:03 AM   Specimen: Nasal Mucosa; Nasopharyngeal  Result Value Ref Range Status   MRSA by PCR NEGATIVE NEGATIVE Final    Comment:        The GeneXpert MRSA Assay (FDA approved for NASAL specimens only), is one component of a comprehensive MRSA colonization surveillance program. It is not intended to diagnose MRSA infection nor to guide or monitor treatment for MRSA infections. Performed at [facility], [place] [address], [place], [place] [address]   Culture, respiratory (non-expectorated)     Status: None (Preliminary result)   Collection Time: 03/05/20  8:36 AM   Specimen: Tracheal Aspirate; Respiratory  Result Value Ref Range Status   Specimen Description TRACHEAL ASPIRATE  Final   Special Requests NONE  Final   Gram Stain   Final    ABUNDANT WBC PRESENT, PREDOMINANTLY PMN ABUNDANT GRAM POSITIVE RODS    Culture   Final    CULTURE REINCUBATED FOR BETTER GROWTH Performed at [facility], [place] [address], [place], [place] [address]    Report Status PENDING  Incomplete    Radiology Studies: No results found.    Neomia Herbel T. [place]  If 7PM-7AM, please contact  night-coverage [email or web address] 03/07/2020, 5:31 PM

## 2020-03-07 NOTE — TOC Initial Note (Addendum)
Transition of Care [facility]) - Initial/Assessment Note    Patient Details  Name: Antonio Ramirez MRN: [id number] Date of Birth: May 26, 1960  Transition of [facility]) CM/SW Contact:    Marilu Favre, RN Phone Number: 03/07/2020, 9:57 AM  Clinical Narrative:                 Patient from home with significant other [facility] [phone] . Spoke with [employer] via telephone. Discussed residential hospice , offered choice. Kristeen Miss prefers [facility] so she can visit Ronnie.   Ronnie's mother is currently in a hospital in [place] with PNA, and Leisa's aunt is at [facility] "not expected to make it.".    Called referral to [place] spoke to [place] .  [address] with [facility] called , patient has been approved , however they do not have a bed. Patient will need covid within 24 hours of discharge.  Expected Discharge Plan: [place] Barriers to Discharge: Continued Medical Work up   Patient Goals and CMS Choice Patient states their goals for this hospitalization and ongoing recovery are:: rehab and go home CMS Medicare.gov Compare Post Acute Care list provided to:: Patient Represenative (must comment) Choice offered to / list presented to : Spouse  Expected Discharge Plan and Services Expected Discharge Plan: [place] In-house Referral: Clinical Social Work Discharge Planning Services: CM Consult Post Acute Care Choice: [place] arrangements for the past 2 months: Single Family Home                 DME Arranged: N/A DME Agency: NA       HH Arranged: NA          Prior Living Arrangements/Services Living arrangements for the past 2 months: Single Family Home Lives with:: Significant Other Patient language and need for interpreter reviewed:: Yes Do you feel safe going back to the place where you live?: Yes      Need for Family Participation in Patient Care: Yes (Comment) Care giver  support system in place?: Yes (comment) Current home services: DME Criminal Activity/Legal Involvement Pertinent to Current Situation/Hospitalization: No - Comment as needed  Activities of Daily Living Home Assistive Devices/Equipment: None ADL Screening (condition at time of admission) Patient's cognitive ability adequate to safely complete daily activities?: No Is the patient deaf or have difficulty hearing?: No Does the patient have difficulty seeing, even when wearing glasses/contacts?: No Does the patient have difficulty concentrating, remembering, or making decisions?: No Patient able to express need for assistance with ADLs?: No Does the patient have difficulty dressing or bathing?: No Independently performs ADLs?: Yes (appropriate for developmental age) Does the patient have difficulty walking or climbing stairs?: No Weakness of Legs: None Weakness of Arms/Hands: None  Permission Sought/Granted Permission sought to share information with : Facility Art therapist granted to share information with : Yes, Verbal Permission Granted     Permission granted to share info w AGENCY: snfs        Emotional Assessment   Attitude/Demeanor/Rapport: Unable to Assess Affect (typically observed): Unable to Assess Orientation: : Oriented to Self Alcohol / Substance Use: Not Applicable Psych Involvement: No (comment)  Admission diagnosis:  Acute respiratory failure (HCC) [J96.00] Acute respiratory failure with hypercapnia (HCC) [J96.02] Altered mental status, unspecified altered mental status type [R41.82] Patient Active Problem List   Diagnosis Date Noted  . Palliative care by specialist   . DNR (do not resuscitate)   . Subarachnoid  hemorrhage 02/26/2020  . Seizure ([place]) 02/22/2020  . Altered mental status   . Endotracheal tube present 02/15/2020  . Acute respiratory failure ([place]) 02/14/2020  . Pressure injury of skin 06/30/2019  . Sepsis ([place]) 06/29/2019  .  Acute respiratory failure with hypoxia ([place]) 06/01/2019  . COPD (chronic obstructive pulmonary disease) ([place])   . COPD with acute exacerbation ([place])   . Current every day smoker   . Opioid dependence ([place])   . Depression   . Hyperlipidemia   . CAD (coronary artery disease)   . Elevated troponin   . AKI (acute kidney injury) ([place])   . Hyperglycemia   . Tobacco abuse   . Demand ischemia of myocardium ([place])   . Hyperlipidemia LDL goal <70    PCP:  [name], DO Pharmacy:   [facility], [place] [address] Phone: [phone] Fax: [phone]     Social Determinants of Health (SDOH) Interventions    Readmission Risk Interventions Readmission Risk Prevention Plan 02/21/2020 02/16/2020  Transportation Screening Complete Complete  Social Work Consult for [place] Planning/Counseling Complete -  [place] Screening Not Applicable Not Applicable  Medication Review Press photographer) Complete Complete  Some recent data might be hidden

## 2020-03-07 NOTE — Progress Notes (Signed)
Brief Neuro Update:  Patient has transitioned to comfort care.  Would recommend continuing Antiseizure medications including Keppra, Vimpat and FosPhenytoin.  Recommend Ativan 2mg  once IV for breakthrough Generalized Tonic Clonic seizure lasting more than 3 mins.  Neurology inpatient team will signoff. Please feel free to contact us for any assistance with seizure management or for any other questions.  [place] Pager Number [phone]

## 2020-03-07 NOTE — Plan of Care (Signed)
  Problem: Education: Goal: Knowledge of General Education information will improve Description: Including pain rating scale, medication(s)/side effects and non-pharmacologic comfort measures Outcome: Completed/Met   Problem: Health Behavior/Discharge Planning: Goal: Ability to manage health-related needs will improve Outcome: Completed/Met   Problem: Clinical Measurements: Goal: Ability to maintain clinical measurements within normal limits will improve Outcome: Progressing Goal: Will remain free from infection Outcome: Completed/Met Goal: Diagnostic test results will improve Outcome: Progressing Goal: Respiratory complications will improve Outcome: Progressing Goal: Cardiovascular complication will be avoided Outcome: Progressing   Problem: Activity: Goal: Risk for activity intolerance will decrease Outcome: Not Applicable   Problem: Nutrition: Goal: Adequate nutrition will be maintained Outcome: Not Applicable   Problem: Coping: Goal: Level of anxiety will decrease Outcome: Progressing   Problem: Elimination: Goal: Will not experience complications related to bowel motility Outcome: Progressing Goal: Will not experience complications related to urinary retention Outcome: Progressing   Problem: Pain Managment: Goal: General experience of comfort will improve Outcome: Progressing   Problem: Safety: Goal: Ability to remain free from injury will improve Outcome: Progressing   Problem: Skin Integrity: Goal: Risk for impaired skin integrity will decrease Outcome: Progressing

## 2020-03-07 NOTE — Progress Notes (Signed)
Nutrition Brief Note  Chart reviewed. Pt now transitioning to comfort care.  No further nutrition interventions warranted at this time.  Please re-consult as needed.   Cornell Gaber W, RD, LDN, CDCES Registered Dietitian II Certified Diabetes Care and Education Specialist Please refer to AMION for RD and/or RD on-call/weekend/after hours pager  

## 2020-03-08 LAB — CULTURE, RESPIRATORY W GRAM STAIN: Culture: NORMAL

## 2020-03-08 NOTE — Plan of Care (Signed)
  Problem: Clinical Measurements: Goal: Ability to maintain clinical measurements within normal limits will improve Outcome: Progressing Goal: Diagnostic test results will improve Outcome: Progressing Goal: Respiratory complications will improve Outcome: Progressing Goal: Cardiovascular complication will be avoided Outcome: Progressing   Problem: Coping: Goal: Level of anxiety will decrease Outcome: Progressing   Problem: Elimination: Goal: Will not experience complications related to bowel motility Outcome: Progressing Goal: Will not experience complications related to urinary retention Outcome: Progressing   Problem: Pain Managment: Goal: General experience of comfort will improve Outcome: Progressing   Problem: Safety: Goal: Ability to remain free from injury will improve Outcome: Progressing   Problem: Skin Integrity: Goal: Risk for impaired skin integrity will decrease Outcome: Progressing   Problem: Activity: Goal: Ability to tolerate increased activity will improve Outcome: Progressing   Problem: Respiratory: Goal: Ability to maintain a clear airway and adequate ventilation will improve Outcome: Progressing   Problem: Role Relationship: Goal: Method of communication will improve Outcome: Progressing

## 2020-03-08 NOTE — Progress Notes (Signed)
PROGRESS NOTE  Antonio Ramirez IPJ:[id number] DOB: 12-06-1960   PCP: [name], DO  Patient is from: Home.  DOA: 02/14/2020 LOS: 6   Brief Narrative / Interim history: 59 year old male with history of PAH, CAD/CABG, COPD, systolic CHF with severe RV dysfunction, depression and chronic pain on chronic opiate admitted to [facility] with acute encephalopathy and hypoxic hypercarbic respiratory failure requiring intubation and mechanical ventilation.  He was intubated from 11/2-11/5.  Intermittently required BiPAP after that.  He was noted to have jerking movements on the right side on 11/10 and a started on Keppra.  CT head also showed small volume acute subarachnoid hemorrhage overlying the right parietal and frontal lobes.  He also remained lethargic and thought to be in persistent seizure and sent to [facility] for EEG on 11/12.    On arrival at [facility], he had increased oxygen requirement and remains somnolent only withdrawing response to noxious stimuli.  24-hour EEG on 11/14 did not reveal seizure or epileptiform discharge.  Antiepileptic medications held.  Patient developed status epilepticus on 11/15 that did not respond with IV Ativan.  ECG on 02/25/2020 showed 2 seizures arising from right frontocentral region.  Eventually he was reintubated.  He was restarted on antiepileptic regimen.  Patient was eventually extubated on 03/06/2020 and transitioned to comfort cares.  TRH assumed care on 03/07/2020.  Plan is to transfer to residential hospice in [place] when bed is available.  Hospice needs COVID-19 screen within 24 hours before transfer.  Subjective: Seen and examined earlier this morning.  No major events overnight of this morning.  Does not appear to be in distress.  Not verbal.  Objective: Vitals:   03/06/20 2200 03/06/20 2308 03/07/20 0447 03/07/20 2103  BP: 118/71 110/69 122/78 125/83  Pulse: 98 81 95 (!) 103  Resp: (!) _0 Temp:  98.7 F (37.1  C) 97.9 F (36.6 C) 97.8 F (36.6 C)  TempSrc:  Axillary Oral Axillary  SpO2: 95% 93% 94% 94%  Weight:      Height:        Intake/Output Summary (Last 24 hours) at 03/08/2020 1419 Last data filed at 03/08/2020 1125 Gross per 24 hour  Intake 126.58 ml  Output 2100 ml  Net -1973.42 ml   Filed Weights   03/04/20 0500 03/05/20 0500 03/06/20 0500  Weight: 69.5 kg 67.9 kg 67.5 kg    Examination:  GENERAL: No apparent distress.  HEENT: MMM.  Vision and hearing grossly intact.  NECK: Supple.  No apparent JVD.  RESP:  No IWOB.  CVS:  RRR.  MSK/EXT:  Moves extremities. No apparent deformity.  NEURO: Awake.  No gross focal deficits.   Assessment & Plan: End-of-life care/comfort measures only/DNR/DNI -Comfort pathway per palliative medicine. -Plan for transfer to residential hospice in [place] on 03/08/2020.  Status epilepticus-off AED -as needed Ativan at this time as patient is full comfort care  Subarachnoid hemorrhage  Acute metabolic encephalopathy-multifactorial.  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  PAF: Rate controlled.  LUL nodule.  Hypernatremia, improved  Hypophosphatemia.  Inadequate oral intake Body mass index is 20.18 kg/m. Nutrition Problem: Inadequate oral intake Etiology: inability to eat Signs/Symptoms: NPO status Interventions: Refer to RD note for recommendations   Pressure skin injury: Pressure Injury 03/01/20 Sacrum  Medial Deep Tissue Pressure Injury - Purple or maroon localized area of discolored intact skin or blood-filled blister due to damage of underlying soft tissue from pressure and/or shear. Red, purple (Active)  03/01/20 2000  Location: Sacrum  Location Orientation: Medial  Staging: Deep Tissue Pressure Injury - Purple or  maroon localized area of discolored intact skin or blood-filled blister due to damage of underlying soft tissue from pressure and/or shear.  Wound Description (Comments): Red, purple  Present on Admission: No   DVT prophylaxis:  None.  Patient is full comfort care  Code Status: DNR/DNI Family Communication: Patient and/or RN. Available if any question.  Status is: Inpatient  Remains inpatient appropriate because:Unsafe d/c plan   Dispo: The patient is from: Home              Anticipated d/c is to: Residential hospice facility              Anticipated d/c date is: 1 day              Patient currently is medically stable to d/c.       Consultants:  Neurology PCCM Palliative medicine   Sch Meds:  Scheduled Meds: . chlorhexidine gluconate (MEDLINE KIT)  15 mL Mouth Rinse BID  . Chlorhexidine Gluconate Cloth  6 each Topical Daily  . influenza vac split quadrivalent PF  0.5 mL Intramuscular Tomorrow-1000  .  morphine injection  2 mg Intravenous Q4H  . pneumococcal 23 valent vaccine  0.5 mL Intramuscular Tomorrow-1000   Continuous Infusions: . sodium chloride 250 mL (03/06/20 2300)   PRN Meds:.acetaminophen **OR** acetaminophen, glycopyrrolate, LORazepam, morphine injection, ondansetron **OR** ondansetron (ZOFRAN) IV  Antimicrobials: Anti-infectives (From admission, onward)   Start     Dose/Rate Route Frequency Ordered Stop   03/05/20 1000  levofloxacin (LEVAQUIN) IVPB 750 mg  Status:  Discontinued        750 mg 100 mL/hr over 90 Minutes Intravenous Every 24 hours 03/05/20 0836 03/07/20 0919   02/15/20 2200  vancomycin (VANCOCIN) IVPB 1000 mg/200 mL premix  Status:  Discontinued        1,000 mg 200 mL/hr over 60 Minutes Intravenous Every 24 hours 02/14/20 2149 02/15/20 1044   02/15/20 1130  ceFAZolin (ANCEF) IVPB 2g/100 mL premix        2 g 200 mL/hr over 30 Minutes Intravenous Every 8 hours 02/15/20 1044 02/20/20 0133   02/14/20 2200  metroNIDAZOLE (FLAGYL) IVPB 500  mg  Status:  Discontinued        500 mg 100 mL/hr over 60 Minutes Intravenous Every 8 hours 02/14/20 2136 02/15/20 1433   02/14/20 2200  vancomycin (VANCOREADY) IVPB 1250 mg/250 mL        1,250 mg 166.7 mL/hr over 90 Minutes Intravenous  Once 02/14/20 2148 02/15/20 0128   02/14/20 2200  ceFEPIme (MAXIPIME) 2 g in sodium chloride 0.9 % 100 mL IVPB  Status:  Discontinued        2 g 200 mL/hr over 30 Minutes Intravenous Every 12 hours 02/14/20 2148 02/15/20 1044       I have personally reviewed the following labs and images: CBC: Recent Labs  Lab 03/02/20 0343 03/03/20 0428  WBC 22.2* 20.7*  NEUTROABS 19.2*  --   HGB 16.3 16.2  HCT 52.1* 52.3*  MCV 86.7 86.7  PLT 167 181   BMP &GFR Recent Labs  Lab 03/02/20 0343 03/03/20 0428  NA 147* 145  K 3.5 3.7  CL 111 105  CO2 31  32  GLUCOSE 137* 135*  BUN 26* 27*  CREATININE 0.68 0.82  CALCIUM 11.2* 11.8*  MG 1.8 2.1  PHOS 2.7  --    Estimated Creatinine Clearance: 91.5 mL/min (by C-G formula based on SCr of 0.82 mg/dL). Liver & Pancreas: Recent Labs  Lab 03/02/20 0343  AST 25  ALT 29  ALKPHOS 72  BILITOT 1.1  PROT 5.9*  ALBUMIN 2.5*   No results for input(s): LIPASE, AMYLASE in the last 168 hours. No results for input(s): AMMONIA in the last 168 hours. Diabetic: No results for input(s): HGBA1C in the last 72 hours. Recent Labs  Lab 03/05/20 1924 03/05/20 2316 03/06/20 0413 03/06/20 0824 03/06/20 1229  GLUCAP 139* 95 125* 124* 138*   Cardiac Enzymes: No results for input(s): CKTOTAL, CKMB, CKMBINDEX, TROPONINI in the last 168 hours. No results for input(s): PROBNP in the last 8760 hours. Coagulation Profile: No results for input(s): INR, PROTIME in the last 168 hours. Thyroid Function Tests: No results for input(s): TSH, T4TOTAL, FREET4, T3FREE, THYROIDAB in the last 72 hours. Lipid Profile: No results for input(s): CHOL, HDL, LDLCALC, TRIG, CHOLHDL, LDLDIRECT in the last 72 hours. Anemia Panel: No  results for input(s): VITAMINB12, FOLATE, FERRITIN, TIBC, IRON, RETICCTPCT in the last 72 hours. Urine analysis:    Component Value Date/Time   COLORURINE AMBER (A) 02/14/2020 1701   APPEARANCEUR HAZY (A) 02/14/2020 1701   LABSPEC 1.021 02/14/2020 1701   PHURINE 5.0 02/14/2020 1701   GLUCOSEU 50 (A) 02/14/2020 1701   HGBUR LARGE (A) 02/14/2020 1701   BILIRUBINUR NEGATIVE 02/14/2020 1701   KETONESUR NEGATIVE 02/14/2020 1701   PROTEINUR 100 (A) 02/14/2020 1701   NITRITE NEGATIVE 02/14/2020 1701   LEUKOCYTESUR NEGATIVE 02/14/2020 1701   Sepsis Labs: Invalid input(s): PROCALCITONIN, [place]  Microbiology: Recent Results (from the past 240 hour(s))  Culture, respiratory (non-expectorated)     Status: None   Collection Time: 03/05/20  8:36 AM   Specimen: Tracheal Aspirate; Respiratory  Result Value Ref Range Status   Specimen Description TRACHEAL ASPIRATE  Final   Special Requests NONE  Final   Gram Stain   Final    ABUNDANT WBC PRESENT, PREDOMINANTLY PMN ABUNDANT GRAM POSITIVE RODS    Culture   Final    MODERATE Normal respiratory flora-no Staph aureus or Pseudomonas seen Performed at [facility], [address] [place], [place] [address]    Report Status 03/08/2020 FINAL  Final    Radiology Studies: No results found.    Casen Pryor T. [place]  If 7PM-7AM, please contact night-coverage [email or web address] 03/08/2020, 2:19 PM

## 2020-03-09 LAB — RESP PANEL BY RT-PCR (FLU A&B, COVID) ARPGX2
Influenza A by PCR: NEGATIVE
Influenza B by PCR: NEGATIVE
SARS Coronavirus 2 by RT PCR: NEGATIVE

## 2020-03-09 NOTE — TOC Transition Note (Signed)
Transition of Care [facility]) - CM/SW Discharge Note   Patient Details  Name: Antonio Ramirez MRN: [id number] Date of Birth: 08/28/1960  [facility] CM/SW Contact:  Bartholomew Crews, RN Phone Number: [phone] 03/09/2020, 2:00 PM   Clinical Narrative:     Damaris Schooner with Colletta Maryland at [place]. Admission paperwork completed. Covid negative. RN notified to call report to [place] at [phone]. PTAR arranged. Patient's significant other/HCPOA, [name], notified. No further TOC needs identified at this time.   Final next level of care: [place] Barriers to Discharge: No Barriers Identified   Patient Goals and CMS Choice Patient states their goals for this hospitalization and ongoing recovery are:: hospice bed CMS Medicare.gov Compare Post Acute Care list provided to:: Patient Represenative (must comment) Katha Cabal) Choice offered to / list presented to : [facility] POA / Guardian Katha Cabal)  Discharge Placement                Patient to be transferred to facility by: [place] Name of family member notified: [place]    Discharge Plan and Services In-house Referral: Clinical Social Work Discharge Planning Services: CM Consult Post Acute Care Choice: [place]          DME Arranged: N/A DME Agency: NA       HH Arranged: NA HH Agency: [facility]        Social Determinants of Health (SDOH) Interventions     Readmission Risk Interventions Readmission Risk Prevention Plan 02/21/2020 02/16/2020  Transportation Screening Complete Complete  Social Work Consult for [place] Planning/Counseling Complete -  [place] Screening Not Applicable Not Applicable  Medication Review Press photographer) Complete Complete  Some recent data might be hidden

## 2020-03-09 NOTE — Progress Notes (Signed)
Report given to RN at [facility] facility.  Patient to be medicated for comfort before transportation to facility.  Will con't to monitor patient until transportation arrives for discharge.

## 2020-03-09 NOTE — TOC Progression Note (Signed)
Transition of Care [facility]) - Progression Note    Patient Details  Name: COPE MARTE MRN: [id number] Date of Birth: [birth year]  Transition of Care [facility]) CM/SW Contact  Bartholomew Crews, RN Phone Number: [phone] 03/09/2020, 10:32 AM  Clinical Narrative:     Damaris Schooner with [facility]let[facility] at [facility]. There is a bed available for patient today Will need rapid covid - already ordered - RN contacted. Stephanie to follow up with NCM once admission paperwork is completed. Will arrange PTAR transport at that time. TOC following for transition needs.   Expected Discharge Plan: [place] Barriers to Discharge: No Barriers Identified  Expected Discharge Plan and Services Expected Discharge Plan: [place] In-house Referral: Clinical Social Work Discharge Planning Services: CM Consult Post Acute Care Choice: [place] arrangements for the past 2 months: [place] Expected Discharge Date: 03/09/20               DME Arranged: N/A DME Agency: NA       HH Arranged: NA           Social Determinants of Health (SDOH) Interventions    Readmission Risk Interventions Readmission Risk Prevention Plan 02/21/2020 02/16/2020  Transportation Screening Complete Complete  Social Work Consult for [place] Planning/Counseling Complete -  [place] Screening Not Applicable Not Applicable  Medication Review Press photographer) Complete Complete  Some recent data might be hidden

## 2020-03-09 NOTE — Discharge Summary (Signed)
Physician Discharge Summary  Antonio Ramirez Amer VZD:[id number] DOB: 22-Aug-1960 DOA: 02/14/2020  PCP: [name], DO  Admit date: 02/14/2020 Discharge date: 03/09/2020  Admitted From: home Disposition: Residential hospice  Discharge Condition: Stable for transfer CODE STATUS: DNR/DNI   Contact information for after-discharge care    Destination    [facility] .   Service: Skilled Nursing Contact information: [place] [phone]                   Hospital Course: 59 year old male with history of PAH, CAD/CABG, COPD, systolic CHF with severe RV dysfunction, depression and chronic pain on chronic opiate admitted to [facility] with acute encephalopathy and hypoxic hypercarbic respiratory failure requiring intubation and mechanical ventilation.  He was intubated from 11/2-11/5.  Intermittently required BiPAP after that.  He was noted to have jerking movements on the right side on 11/10 and a started on Keppra.  CT head also showed small volume acute subarachnoid hemorrhage overlying the right parietal and frontal lobes.  He also remained lethargic and thought to be in persistent seizure and sent to [facility] for EEG on 11/12.    On arrival at [facility], he had increased oxygen requirement and remains somnolent only withdrawing response to noxious stimuli.  24-hour EEG on 11/14 did not reveal seizure or epileptiform discharge.  Antiepileptic medications held.  Patient developed status epilepticus on 11/15 that did not respond with IV Ativan.  ECG on 02/25/2020 showed 2 seizures arising from right frontocentral region.  Eventually he was reintubated.  He was restarted on antiepileptic regimen.  Patient was eventually extubated on 03/06/2020 and transitioned to comfort cares.  [facility] assumed care on 03/07/2020.    Patient transferred to residential hospice in [place] on 03/09/2020.  See individual problem list below  for more hospital course.  Discharge Diagnoses:  End-of-life care/comfort measures only/DNR/DNI -Comfort pathway per palliative medicine. -Plan for transfer to residential hospice in [place] on 03/08/2020.  Status epilepticus-off AED -as needed Ativan at this time as patient is full comfort care  Subarachnoid hemorrhage  Acute metabolic encephalopathy-multifactorial.  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  PAF: Rate controlled.  LUL nodule.  Hypernatremia, improved  Hypophosphatemia.  Inadequate oral intake   Body mass index is 20.18 kg/m. Nutrition Problem: Inadequate oral intake Etiology: inability to eat Signs/Symptoms: NPO status Interventions: Refer to RD note for recommendations  Pressure skin injury: Pressure Injury 03/01/20 Sacrum Medial Deep Tissue Pressure Injury - Purple or maroon localized area of discolored intact skin or blood-filled blister due to damage of underlying soft tissue from pressure and/or shear. Red, purple (Active)  03/01/20 2000  Location: Sacrum  Location Orientation: Medial  Staging: Deep Tissue Pressure Injury - Purple or maroon localized area of discolored intact skin or blood-filled blister due to damage of underlying soft tissue from pressure and/or shear.  Wound Description (Comments): Red, purple  Present on Admission: No    Discharge Exam: Vitals:   03/08/20 1544 03/08/20 2200  BP:  107/88  Pulse: (!) 107 (!) 114  Resp:  18  Temp:  98.3 F (36.8 C)  SpO2: 90% 94%   GENERAL: No apparent distress.  HEENT: MMM.  Vision and hearing grossly intact.  NECK: Supple.  No apparent  JVD.  RESP:  Some increased work of breathing.  CVS:  RRR.  MSK/EXT:  Moves extremities. No apparent deformity.  NEURO: Awake.  No gross focal  deficits.  Nonverbal.   Discharge Instructions   Allergies as of 03/09/2020      Reactions   Opana [oxymorphone Hcl] Hives   hiv      Medication List    STOP taking these medications   albuterol 108 (90 Base) MCG/ACT inhaler Commonly known as: VENTOLIN HFA   Amitiza 24 MCG capsule Generic drug: lubiprostone   aspirin EC 81 MG tablet   aspirin-acetaminophen-caffeine 250-250-65 MG tablet Commonly known as: EXCEDRIN MIGRAINE   atorvastatin 40 MG tablet Commonly known as: LIPITOR   calcitRIOL 0.5 MCG capsule Commonly known as: ROCALTROL   citalopram 10 MG tablet Commonly known as: CELEXA   clopidogrel 75 MG tablet Commonly known as: PLAVIX   gabapentin 600 MG tablet Commonly known as: NEURONTIN   isosorbide mononitrate 30 MG 24 hr tablet Commonly known as: IMDUR   omeprazole 20 MG capsule Commonly known as: PRILOSEC   oxyCODONE 15 MG immediate release tablet Commonly known as: ROXICODONE   polyethylene glycol 17 g packet Commonly known as: MIRALAX / GLYCOLAX   senna-docusate 8.6-50 MG tablet Commonly known as: Senokot-S   Symbicort 160-4.5 MCG/ACT inhaler Generic drug: budesonide-formoterol   Xtampza ER 36 MG C12a Generic drug: oxyCODONE ER       Consultations:  Neurology  Intensivist  Palliative medicine    EEG  Result Date: 02/23/2020 Phillips Odor, MD     02/23/2020  6:25 PM McCormick A. [name], MD     [email or web address]       HISTORY: The patient presents with recurrent seizures. MEDICATIONS: Current Facility-Administered Medications: .  acetaminophen (TYLENOL) tablet 650 mg, 650 mg, Oral, Q6H PRN **OR** acetaminophen (TYLENOL) suppository 650 mg, 650 mg, Rectal, Q6H PRN, Emokpae, Ejiroghene E, MD, 650 mg at 02/23/20 0443 .  albuterol (PROVENTIL) (2.5 MG/3ML) 0.083% nebulizer solution 2.5 mg, 2.5 mg, Nebulization, Q4H PRN, Emokpae, Courage, MD .  chlorhexidine gluconate (MEDLINE KIT) (PERIDEX) 0.12 % solution 15 mL,  15 mL, Mouth Rinse, BID, Emokpae, Ejiroghene E, MD, 15 mL at 02/23/20 1111 .  Chlorhexidine Gluconate Cloth 2 % PADS 6 each, 6 each, Topical, Daily, Anders Simmonds, MD, 6 each at 02/23/20 1112 .  dextrose 5 %-0.45 % sodium chloride infusion, , Intravenous, Continuous, Emokpae, Courage, MD, Last Rate: 20 mL/hr at 02/23/20 1250, New Bag at 02/23/20 1250 .  diphenhydrAMINE (BENADRYL) injection 25 mg, 25 mg, Intravenous, Q6H PRN, Emokpae, Courage, MD .  famotidine (PEPCID) IVPB 20 mg premix, 20 mg, Intravenous, Q12H, Emokpae, Ejiroghene E, MD, Last Rate: 100 mL/hr at 02/23/20 1112, 20 mg at 02/23/20 1112 .  feeding supplement (ENSURE ENLIVE / ENSURE PLUS) liquid 237 mL, 237 mL, Oral, TID BM, Emokpae, Courage, MD, 237 mL at 02/21/20 2147 .  influenza vac split quadrivalent PF (FLUARIX) injection 0.5 mL, 0.5 mL, Intramuscular, Tomorrow-1000, Emokpae, Ejiroghene E, MD .  insulin aspart (novoLOG) injection 0-5 Units, 0-5 Units, Subcutaneous, QHS, Emokpae, Courage, MD .  insulin aspart (novoLOG) injection 0-6 Units, 0-6 Units, Subcutaneous, TID WC, Emokpae, Courage, MD .  ipratropium-albuterol (DUONEB) 0.5-2.5 (3) MG/3ML nebulizer solution 3 mL, 3 mL, Nebulization, Q6H, Emokpae, Courage, MD, 3 mL at 02/23/20 1311 .  isosorbide mononitrate (IMDUR) 24 hr tablet 30 mg, 30 mg, Oral, Daily, Emokpae, Courage, MD .  levETIRAcetam (KEPPRA) IVPB 500 mg/100 mL premix, 500 mg, Intravenous, Q8H, [place],  Kofi, MD, Last Rate: 400 mL/hr at 02/23/20 1117, 500 mg at 02/23/20 1117 .  LORazepam (ATIVAN) injection 0.5 mg, 0.5 mg, Intravenous, Q8H PRN, Emokpae, Courage, MD, 0.5 mg at 02/23/20 0114 .  losartan (COZAAR) tablet 25 mg, 25 mg, Oral, Daily, Satira Sark, MD, 25 mg at 02/21/20 0906 .  MEDLINE mouth rinse, 15 mL, Mouth Rinse, BID, Emokpae, Courage, MD, 15 mL at 02/23/20 1115 .  metoprolol tartrate (LOPRESSOR) tablet 37.5 mg, 37.5 mg, Oral, BID, Branch, Alphonse Guild, MD .  multivitamin with minerals tablet 1 tablet, 1 tablet,  Oral, Daily, Emokpae, Courage, MD, 1 tablet at 02/21/20 1700 .  ondansetron (ZOFRAN) tablet 4 mg, 4 mg, Oral, Q6H PRN **OR** ondansetron (ZOFRAN) injection 4 mg, 4 mg, Intravenous, Q6H PRN, Emokpae, Ejiroghene E, MD .  oxyCODONE (Oxy IR/ROXICODONE) immediate release tablet 10 mg, 10 mg, Oral, Q6H PRN, Emokpae, Courage, MD .  pneumococcal 23 valent vaccine (PNEUMOVAX-23) injection 0.5 mL, 0.5 mL, Intramuscular, Tomorrow-1000, Emokpae, Ejiroghene E, MD .  potassium chloride (KLOR-CON) packet 40 mEq, 40 mEq, Oral, Once, Zierle-Ghosh, Asia B, DO .  senna-docusate (Senokot-S) tablet 2 tablet, 2 tablet, Oral, BID, Emokpae, Courage, MD ANALYSIS: A 16 channel recording using standard 10 20 measurements is conducted for 26 minutes.  There is significant myogenic artifact seen throughout the recording.  The background activity gets as high as 6 to 7 Hz.  Photic stimulation and hyperventilation are not conducted.  There is no focal or lateral slowing.  There is no epileptiform activity is noted. IMPRESSION: 1.  The recording shows a mild to moderate global slowing indicating a mild to moderate global encephalopathy.  No epileptiform activities are noted. Kofi A. [name], M.D. Diplomate, Tax adviser of Psychiatry and Neurology ( Neurology).   EEG  Result Date: 02/22/2020 Lora Havens, MD     02/22/2020 12:34 PM Patient Name: Antonio Ramirez MRN: [id number] Epilepsy Attending: Lora Havens Referring Physician/Provider: Dr Kathie Dike Date: 02/22/2020 Duration: 26.13 mins Patient history: 59 year old male with chronic right parietal and temporal strokes who presented with altered mental status.  EEG to evaluate for seizures. Level of alertness: Awake AEDs during EEG study: None Technical aspects: This EEG study was done with scalp electrodes positioned according to the 10-20 International system of electrode placement. Electrical activity was acquired at a sampling rate of _0  and reviewed with a high  frequency filter of _1  and a low frequency filter of _2 . EEG data were recorded continuously and digitally stored. Description: No clear posterior dominant rhythm was seen.  EEG showed continuous generalized 3 to 6 Hz theta delta slowing.  Hyperventilation and photic stimulation were not performed.   ABNORMALITY -Continuous slow, generalized IMPRESSION: This study is suggestive of moderate diffuse encephalopathy, nonspecific etiology. No seizures or epileptiform discharges were seen throughout the recording. Priyanka [name]   CT ANGIO HEAD W OR WO CONTRAST  Result Date: 02/26/2020 CLINICAL DATA:  Subarachnoid hemorrhage follow up.  Stroke/TIA. EXAM: CT ANGIOGRAPHY HEAD AND NECK TECHNIQUE: Multidetector CT imaging of the head and neck was performed using the standard protocol during bolus administration of intravenous contrast. Multiplanar CT image reconstructions and MIPs were obtained to evaluate the vascular anatomy. Carotid stenosis measurements (when applicable) are obtained utilizing NASCET criteria, using the distal internal carotid diameter as the denominator. CONTRAST:  4m OMNIPAQUE IOHEXOL 350 MG/ML SOLN COMPARISON:  02/23/2020 FINDINGS: CT HEAD FINDINGS Brain: Unchanged small volume subarachnoid hemorrhage over the right convexity. There is also small amount of subarachnoid blood  at the right frontal pole, also unchanged. The size and configuration of the ventricles and extra-axial CSF spaces are normal. 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 The brain parenchyma is normal. Skull: The visualized skull base, calvarium and extracranial soft tissues are normal. Sinuses/Orbits: No fluid levels or advanced mucosal thickening of the visualized paranasal sinuses. No mastoid or middle ear effusion. The orbits are normal. CTA NECK FINDINGS SKELETON: There is no bony spinal canal stenosis. No lytic or blastic lesion. OTHER NECK: Normal pharynx, larynx and major salivary glands. No cervical lymphadenopathy.  Unremarkable thyroid gland. UPPER CHEST: No pneumothorax or pleural effusion. No nodules or masses. AORTIC ARCH: There is calcific atherosclerosis of the aortic arch. There is no aneurysm, dissection or hemodynamically significant stenosis of the visualized portion of the aorta. Conventional 3 vessel aortic branching pattern. The visualized proximal subclavian arteries are widely patent. RIGHT CAROTID SYSTEM: No dissection, occlusion or aneurysm. Mild atherosclerotic calcification at the carotid bifurcation without hemodynamically significant stenosis. LEFT CAROTID SYSTEM: Normal without aneurysm, dissection or stenosis. VERTEBRAL ARTERIES: Left dominant configuration. Both origins are clearly patent. There is no dissection, occlusion or flow-limiting stenosis to the skull base (V1-V3 segments). CTA HEAD FINDINGS POSTERIOR CIRCULATION: --Vertebral arteries: Normal V4 segments. --Inferior cerebellar arteries: Normal. --Basilar artery: Normal. --Superior cerebellar arteries: Normal. --Posterior cerebral arteries (PCA): Normal. ANTERIOR CIRCULATION: --Intracranial internal carotid arteries: Normal. --Anterior cerebral arteries (ACA): Normal. Both A1 segments are present. Patent anterior communicating artery (a-comm). --Middle cerebral arteries (MCA): Normal. VENOUS SINUSES: As permitted by contrast timing, patent. ANATOMIC VARIANTS: None Review of the MIP images confirms the above findings. IMPRESSION: 1. Unchanged small volume subarachnoid hemorrhage over the right convexity and at the right frontal pole. 2. No intracranial arterial occlusion or high-grade stenosis. 3. 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 Aortic Atherosclerosis (ICD10-I70.0). Electronically Signed   By: Ulyses Jarred M.D.   On: 02/26/2020 21:25   DG Abd 1 View  Result Date: 02/26/2020 CLINICAL DATA:  Preprocedure evaluation for MRI EXAM: ABDOMEN - 1 VIEW COMPARISON:  06/29/2019 FINDINGS: Supine frontal views of the abdomen and pelvis are obtained,  excluding portions of the left flank by collimation. Minimal hypoventilatory change at the left lung base. Postsurgical changes from median sternotomy, with wires and surgical clips identified in the lower mediastinum. No bowel obstruction or ileus. Excreted contrast from previously performed CT angiogram. No radiopaque foreign bodies within the abdomen or pelvis. No acute bony abnormalities. IMPRESSION: 1. Postsurgical changes from median sternotomy, with mediastinal clips and median sternotomy wires. 2. No radiopaque foreign bodies within the abdomen or pelvis. Electronically Signed   By: Randa Ngo M.D.   On: 02/26/2020 21:22   CT HEAD WO CONTRAST  Result Date: 02/27/2020 CLINICAL DATA:  Acute respiratory failure, seizure EXAM: CT HEAD WITHOUT CONTRAST TECHNIQUE: Contiguous axial images were obtained from the base of the skull through the vertex without intravenous contrast. COMPARISON:  MRI 02/26/2020 FINDINGS: Brain: Moderate parenchymal volume loss again noted, stable since prior examination. Mild periventricular white matter changes are present likely reflecting the sequela of small vessel ischemia. Remote right temporoparietal infarct again noted. Small subarachnoid hemorrhage within the right parietal and right frontal sulci appears stable since prior examination. No interval hemorrhage. No abnormal mass effect or midline shift. Ventricular size is normal. Cerebellum is unremarkable. Vascular: No asymmetric hyperdense vasculature at the skull base. Skull: Intact Sinuses/Orbits: The visualized paranasal sinuses are clear. The visualized orbits are unremarkable. Other: Mastoid air cells and middle ear cavities are clear IMPRESSION: Stable small subarachnoid hemorrhage. No evidence  of interval hemorrhage. No abnormal mass effect or midline shift. Remote right parietotemporal infarct again noted. Electronically Signed   By: Fidela Salisbury MD   On: 02/27/2020 04:45   CT HEAD WO CONTRAST  Result  Date: 02/24/2020 CLINICAL DATA:  Seizure EXAM: CT HEAD WITHOUT CONTRAST TECHNIQUE: Contiguous axial images were obtained from the base of the skull through the vertex without intravenous contrast. 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 Small volume subarachnoid hemorrhage involving the right frontal lobe and within the right parietal region within the central sulcus at the vertex is stable since prior examination. No interval hemorrhage. No abnormal mass effect or midline shift. Parenchymal volume loss is commensurate with the patient's age and is stable since prior examination. Mild periventricular white matter changes are present likely reflecting the sequela of small vessel ischemia. Remote small right cerebellar infarct again noted. No evidence of acute infarct. Ventricular size is normal. No abnormal intra or extra-axial mass lesion. Vascular: No asymmetric hyperdense vasculature at the skull base. Skull: Normal. Negative for fracture or focal lesion. Sinuses/Orbits: No acute finding. Other: Mastoid air cells and middle ear cavities are clear. IMPRESSION: Stable small volume frontal and parietal subarachnoid hemorrhage. Unchanged right temporoparietal encephalomalacia remained to remote cortical infarct. No evidence of acute infarct. No abnormal mass effect or midline shift. Electronically Signed   By: Fidela Salisbury MD   On: 02/24/2020 01:21   CT HEAD WO CONTRAST  Result Date: 02/23/2020 CLINICAL DATA:  Recent intracranial hemorrhage EXAM: CT HEAD WITHOUT CONTRAST TECHNIQUE: Contiguous axial images were obtained from the base of the skull through the vertex without intravenous contrast. COMPARISON:  February 22, 2020 FINDINGS: Brain: There is essentially stable hemorrhage in the right parietal lobe region. No new foci of hemorrhage are evident. There is no mass, extra-axial fluid collection, or midline shift. Mild  to moderate underlying atrophy is stable. There is evidence of a prior infarct involving portions of the superior, posterior right temporal and right temporal-parietal-occipital junction region. Suspect acute component to this infarct along its more superomedial aspect. Appearance stable in this area. Elsewhere there is mild small vessel disease in the periventricular white matter, stable. No new focus of infarction evident. Vascular: No appreciable hyperdense vessel. There is calcification in the right carotid siphon region. Skull: Bony calvarium appears intact. Sinuses/Orbits: Small retention cyst noted in the anterior left sphenoid sinus. There is mild mucosal thickening in several ethmoid air cells. Visualized orbits appear symmetric bilaterally. Other: Mastoid air cells are clear. IMPRESSION: 1. Stable subarachnoid hemorrhage in the right anterior mid parietal lobe regions. No new focus or progression of hemorrhage. 2. Stable infarct involving portions of the right temporal lobe as well as the right parietal-occipital junction with suspected more acute component along the superomedial aspect of this infarct, stable. No new infarct evident. 3.  Stable atrophy with mild periventricular small vessel disease. 4.  Foci of arterial vascular calcification. 5.  Mild paranasal sinus disease. Electronically Signed   By: Lowella Grip III M.D.   On: 02/23/2020 11:35   CT HEAD WO CONTRAST  Addendum Date: 02/22/2020   ADDENDUM REPORT: 02/22/2020 10:27 ADDENDUM: These results were called by telephone at the time of interpretation on 02/22/2020 at 10:27 am to provider [facility] , who verbally acknowledged these results. Electronically Signed   By: Kellie Simmering DO   On: 02/22/2020 10:27   Result Date: 02/22/2020 CLINICAL DATA:  Neuro deficit, acute, stroke suspected. Additional  history provided: Altered mental status today, SVT, patient combative. EXAM: CT HEAD WITHOUT CONTRAST TECHNIQUE: Contiguous axial  images were obtained from the base of the skull through the vertex without intravenous contrast. COMPARISON:  Head CT 02/18/2020. FINDINGS: Brain: Significantly motion degraded examination, limiting evaluation. Stable generalized cerebral atrophy. New from the prior examination of 02/18/2020, there is small volume acute subarachnoid hemorrhage overlying the right parietal and frontal lobes (for instance as seen on series 10, image 10) (series 2, image 9). Redemonstrated chronic cortical/subcortical infarct within the right parietal and posterosuperior right temporal lobes. No acute demarcated cortical infarct is identified. No evidence of intracranial mass. No midline shift. Vascular: No hyperdense vessel. Skull: No fracture or focal suspicious osseous lesion identified. Sinuses/Orbits: Visualized orbits show no acute finding. Mild ethmoid sinus mucosal thickening at the imaged levels. IMPRESSION: Significantly motion degraded examination, limiting evaluation. Small volume acute subarachnoid hemorrhage overlying the right parietal and frontal lobes, new as compared to the head CT of 02/18/2020. Redemonstrated chronic cortical/subcortical infarcts within the right parietal and temporal lobes. Stable generalized cerebral atrophy. Electronically Signed: By: Kellie Simmering DO On: 02/22/2020 10:14   CT HEAD WO CONTRAST  Result Date: 02/18/2020 CLINICAL DATA:  Acute neuro deficit.  Stroke suspected. EXAM: CT HEAD WITHOUT CONTRAST TECHNIQUE: Contiguous axial images were obtained from the base of the skull through the vertex without intravenous contrast. COMPARISON:  02/23/2020.  CT head 06/29/2019 FINDINGS: Brain: Hypodensity in the right temporal and parietal lobe unchanged compatible with chronic infarct. Negative for acute infarct, hemorrhage, mass. Negative for hydrocephalus. Vascular: Negative for hyperdense vessel Skull: Negative Sinuses/Orbits: Paranasal sinuses clear.  Negative orbit Other: None IMPRESSION:  Chronic right MCA infarct. No acute abnormality and no change from the prior CT. Electronically Signed   By: Franchot Gallo M.D.   On: 02/18/2020 15:03   CT Head Wo Contrast  Result Date: 02/13/2020 CLINICAL DATA:  Unresponsive EXAM: CT HEAD WITHOUT CONTRAST TECHNIQUE: Contiguous axial images were obtained from the base of the skull through the vertex without intravenous contrast. COMPARISON:  06/29/2019 FINDINGS: Brain: There again noted changes consistent with prior encephalomalacia from ischemic event in the distribution of the right middle cerebral artery involving the right temporal and parietal lobes. The overall appearance is stable. No findings to suggest acute hemorrhage, acute infarction or space-occupying mass lesion are noted. Mild atrophic changes are again seen. Vascular: No hyperdense vessel or unexpected calcification. Skull: Normal. Negative for fracture or focal lesion. Sinuses/Orbits: No acute finding. Other: None. 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 No acute abnormality noted. Electronically Signed   By: Inez Catalina M.D.   On: 02/13/2020 21:30   CT ANGIO NECK W OR WO CONTRAST  Result Date: 02/26/2020 CLINICAL DATA:  Subarachnoid hemorrhage follow up.  Stroke/TIA. EXAM: CT ANGIOGRAPHY HEAD AND NECK TECHNIQUE: Multidetector CT imaging of the head and neck was performed using the standard protocol during bolus administration of intravenous contrast. Multiplanar CT image reconstructions and MIPs were obtained to evaluate the vascular anatomy. Carotid stenosis measurements (when applicable) are obtained utilizing NASCET criteria, using the distal internal carotid diameter as the denominator. CONTRAST:  62m OMNIPAQUE IOHEXOL 350 MG/ML SOLN COMPARISON:  02/23/2020 FINDINGS: CT HEAD FINDINGS Brain: Unchanged small volume subarachnoid hemorrhage over the right convexity. There is also small amount of subarachnoid blood at the  right frontal pole, also unchanged. The size and configuration of the ventricles and extra-axial CSF spaces are normal. 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 The  brain parenchyma is normal. Skull: The visualized skull base, calvarium and extracranial soft tissues are normal. Sinuses/Orbits: No fluid levels or advanced mucosal thickening of the visualized paranasal sinuses. No mastoid or middle ear effusion. The orbits are normal. CTA NECK FINDINGS SKELETON: There is no bony spinal canal stenosis. No lytic or blastic lesion. OTHER NECK: Normal pharynx, larynx and major salivary glands. No cervical lymphadenopathy. Unremarkable thyroid gland. UPPER CHEST: No pneumothorax or pleural effusion. No nodules or masses. AORTIC ARCH: There is calcific atherosclerosis of the aortic arch. There is no aneurysm, dissection or hemodynamically significant stenosis of the visualized portion of the aorta. Conventional 3 vessel aortic branching pattern. The visualized proximal subclavian arteries are widely patent. RIGHT CAROTID SYSTEM: No dissection, occlusion or aneurysm. Mild atherosclerotic calcification at the carotid bifurcation without hemodynamically significant stenosis. LEFT CAROTID SYSTEM: Normal without aneurysm, dissection or stenosis. VERTEBRAL ARTERIES: Left dominant configuration. Both origins are clearly patent. There is no dissection, occlusion or flow-limiting stenosis to the skull base (V1-V3 segments). CTA HEAD FINDINGS POSTERIOR CIRCULATION: --Vertebral arteries: Normal V4 segments. --Inferior cerebellar arteries: Normal. --Basilar artery: Normal. --Superior cerebellar arteries: Normal. --Posterior cerebral arteries (PCA): Normal. ANTERIOR CIRCULATION: --Intracranial internal carotid arteries: Normal. --Anterior cerebral arteries (ACA): Normal. Both A1 segments are present. Patent anterior communicating artery (a-comm). --Middle cerebral arteries (MCA): Normal. VENOUS SINUSES: As permitted by contrast timing,  patent. ANATOMIC VARIANTS: None Review of the MIP images confirms the above findings. IMPRESSION: 1. Unchanged small volume subarachnoid hemorrhage over the right convexity and at the right frontal pole. 2. No intracranial arterial occlusion or high-grade stenosis. 3. 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 Aortic Atherosclerosis (ICD10-I70.0). Electronically Signed   By: Ulyses Jarred M.D.   On: 02/26/2020 21:25   MR BRAIN W WO CONTRAST  Result Date: 02/26/2020 CLINICAL DATA:  Subarachnoid hemorrhage follow up EXAM: MRI HEAD WITHOUT AND WITH CONTRAST TECHNIQUE: Multiplanar, multiecho pulse sequences of the brain and surrounding structures were obtained without and with intravenous contrast. CONTRAST:  7.51m GADAVIST GADOBUTROL 1 MMOL/ML IV SOLN COMPARISON:  Head CT 02/26/2020 FINDINGS: Brain: Small amount of subarachnoid hemorrhage again demonstrated over the right frontal pole and superior right parietal lobe. Multifocal hyperintense T2-weighted signal within the white matter. 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 Advanced atrophy for age. There are areas of leptomeningeal contrast enhancement in the left cerebellum and within the superior cerebral hemispheres. Normal midline structures. Vascular: Normal flow voids. Skull and upper cervical spine: Normal marrow signal. Sinuses/Orbits: Negative. Other: None. IMPRESSION: 1. Small amount of subarachnoid hemorrhage over the right frontal pole and superior right parietal lobe. 2. 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 3. Advanced atrophy and chronic microvascular disease. 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 Electronically Signed   By: KUlyses JarredM.D.   On: 02/26/2020 23:29   NM Pulmonary Perfusion  Result Date: 02/15/2020 CLINICAL DATA:  Respiratory failure EXAM: NUCLEAR MEDICINE PERFUSION LUNG SCAN TECHNIQUE: Perfusion images were  obtained in multiple projections after intravenous injection of radiopharmaceutical. Ventilation scans intentionally deferred if perfusion scan and chest x-ray adequate for interpretation during COVID 19 epidemic. RADIOPHARMACEUTICALS:  4.4 mCi Tc-937mAA IV COMPARISON:  Chest radiograph 02/15/2020 FINDINGS: Inhomogeneous tracer distribution in a nonsegmental pattern in both lungs, pattern favoring parenchymal lung disease. Tiny subsegmental defects at lung bases. Findings are not suspicious for pulmonary embolism. IMPRESSION: No scintigraphic evidence of pulmonary embolism. Electronically Signed   By: MaCrist Infante.  On: 02/15/2020 12:40   US Venous Img Lower Unilateral Left (DVT)  Result Date: 02/15/2020 CLINICAL DATA:  Left lower extremity edema. EXAM: LEFT LOWER EXTREMITY VENOUS DOPPLER ULTRASOUND TECHNIQUE: Gray-scale sonography with graded compression, as well as color Doppler and duplex ultrasound were performed to evaluate the lower extremity deep venous systems from the level of the common femoral vein and including the common femoral, femoral, profunda femoral, popliteal and calf veins including the posterior tibial, peroneal and gastrocnemius veins when visible. The superficial great saphenous vein was also interrogated. Spectral Doppler was utilized to evaluate flow at rest and with distal augmentation maneuvers in the common femoral, femoral and popliteal veins. COMPARISON:  None. FINDINGS: Contralateral Common Femoral Vein: Respiratory phasicity is normal and symmetric with the symptomatic side. No evidence of thrombus. Normal compressibility. Common Femoral Vein: No evidence of thrombus. Normal compressibility, respiratory phasicity and response to augmentation. Saphenofemoral Junction: No evidence of thrombus. Normal compressibility and flow on color Doppler imaging. Profunda Femoral Vein: No evidence of thrombus. Normal compressibility and flow on color Doppler imaging. Femoral Vein: No  evidence of thrombus. Normal compressibility, respiratory phasicity and response to augmentation. Popliteal Vein: No evidence of thrombus. Normal compressibility, respiratory phasicity and response to augmentation. Calf Veins: No evidence of thrombus. Normal compressibility and flow on color Doppler imaging. Superficial Great Saphenous Vein: No evidence of thrombus. Normal compressibility. Venous Reflux:  None. Other Findings: No evidence of superficial thrombophlebitis or abnormal fluid collection. IMPRESSION: No evidence of left lower extremity deep venous thrombosis. Electronically Signed   By: Aletta Edouard M.D.   On: 02/15/2020 10:32   DG Chest Port 1 View  Result Date: 02/29/2020 CLINICAL DATA:  Hypoxia EXAM: PORTABLE CHEST 1 VIEW COMPARISON:  February 27, 2020 FINDINGS: Endotracheal tube tip is 4.5 cm above the carina. Nasogastric tube tip and side port below the diaphragm. No pneumothorax. There is postoperative change with scarring and volume loss right upper lobe. Postoperative change also noted in the medial right base region with volume loss. There is a subtle nodular opacity in the left upper lobe measuring 1.1 x 1.0 cm. Lungs elsewhere are clear. Heart size and pulmonary vascular normal. Patient is status post median sternotomy. There is aortic atherosclerosis. No bone lesions. IMPRESSION: Tube positions as described without pneumothorax. Postoperative changes on the right with areas of scarring and volume loss. 1.1 x 1.0 cm nodular opacity left upper lobe which warrants noncontrast enhanced chest CT when patient is clinically able. No edema or consolidation.  Heart size normal. Aortic Atherosclerosis (ICD10-I70.0). These results will be called to the ordering clinician or representative by the Radiologist Assistant, and communication documented in the PACS or [employer]. Electronically Signed   By: Lowella Grip III M.D.   On: 02/29/2020 07:57   Portable Chest x-ray  Result Date:  02/27/2020 CLINICAL DATA:  Endotracheal tube placement EXAM: PORTABLE CHEST 1 VIEW COMPARISON:  02/18/2020 FINDINGS: Endotracheal tube tip is at the level of the clavicular heads. Enteric tube side port projects near the gastric pylorus. The tip is below the field of view. Lungs are hyperinflated. No focal airspace consolidation or pulmonary edema. IMPRESSION: Endotracheal tube tip at the level of the clavicular heads. Electronically Signed   By: Ulyses Jarred M.D.   On: 02/27/2020 03:26   DG Chest Port 1 View  Result Date: 02/18/2020 CLINICAL DATA:  Respiratory failure with hypoxia. EXAM: PORTABLE CHEST 1 VIEW COMPARISON:  02/16/2020 FINDINGS: Endotracheal tube removed.  NG tube removed. Lungs well aerated and clear  without infiltrate or effusion. Negative for heart failure. IMPRESSION: No active disease. Electronically Signed   By: Franchot Gallo M.D.   On: 02/18/2020 15:08   DG CHEST PORT 1 VIEW  Result Date: 02/16/2020 CLINICAL DATA:  Recent aspiration, initial encounter EXAM: PORTABLE CHEST 1 VIEW COMPARISON:  02/15/2020 FINDINGS: Cardiac shadow is stable. The lungs are hyperinflated but clear. Gastric catheter is noted within the stomach. Endotracheal tube is seen extending to the level of the aortic arch in satisfactory position. No confluent infiltrate is noted. Mild vascular congestion remains. IMPRESSION: Stable vascular congestion.  No new focal abnormality is noted. Electronically Signed   By: Inez Catalina M.D.   On: 02/16/2020 16:04   Portable Chest xray  Result Date: 02/15/2020 CLINICAL DATA:  Acute respiratory failure. EXAM: PORTABLE CHEST 1 VIEW COMPARISON:  02/14/2020. FINDINGS: The ETT tip is stable above the carina. There is a nasogastric tube with tip and side port below the GE junction. Heart size appears normal. No pleural effusion or airspace consolidation. Pulmonary vascular congestion noted. No overt edema. IMPRESSION: Pulmonary vascular congestion. Electronically Signed   By:  Kerby Moors M.D.   On: 02/15/2020 05:41   DG Chest Portable 1 View  Result Date: 02/14/2020 CLINICAL DATA:  Check gastrostomy catheter EXAM: PORTABLE CHEST 1 VIEW COMPARISON:  02/14/2020 FINDINGS: Gastric catheter is been withdrawn and now lies with the tip in the stomach. Proximal side port is noted at the gastroesophageal junction. This could be advanced a few cm deeper into the stomach. IMPRESSION: Gastric catheter as described. Electronically Signed   By: Inez Catalina M.D.   On: 02/14/2020 21:19   DG Chest Portable 1 View  Result Date: 02/14/2020 CLINICAL DATA:  Hypoxia check gastric catheter placement EXAM: PORTABLE CHEST 1 VIEW COMPARISON:  02/14/2020 FINDINGS: Endotracheal tube and gastric catheter are again seen. The gastric catheter has been advanced and is now coiled within the stomach extending into the proximal duodenum. The lungs are hyperinflated consistent with COPD. No bony abnormality is noted. IMPRESSION: Gastric catheter as described. COPD without acute abnormality. Electronically Signed   By: Inez Catalina M.D.   On: 02/14/2020 21:14   DG Chest Portable 1 View  Result Date: 02/14/2020 CLINICAL DATA:  Intubated, loss of consciousness, COPD EXAM: PORTABLE CHEST 1 VIEW COMPARISON:  02/14/2020 FINDINGS: 2 frontal views of the chest demonstrate endotracheal tube overlying tracheal air column, tip approximately 3.8 cm above carina. Enteric catheter tip and side port project over the distal thoracic esophagus. Cardiac silhouette is unremarkable. Background emphysema again noted without airspace disease, effusion, or pneumothorax. No acute bony abnormalities. IMPRESSION: 1. Enteric catheter tip and side port projecting over the distal thoracic esophagus. Recommend advancing at least 7 cm to ensure side port placement within the gastric lumen. 2. No complication after intubation. 3. Emphysema. Electronically Signed   By: Randa Ngo M.D.   On: 02/14/2020 18:40   DG Chest Portable 1  View  Result Date: 02/14/2020 CLINICAL DATA:  Shortness of breath, history of COPD EXAM: PORTABLE CHEST 1 VIEW COMPARISON:  02/13/2020 FINDINGS: Emphysema and chronic interstitial changes. No new consolidation or edema. No pleural effusion or pneumothorax. Stable cardiomediastinal contours. IMPRESSION: COPD without acute process in the chest. Electronically Signed   By: Macy Mis M.D.   On: 02/14/2020 17:32   DG Chest Portable 1 View  Result Date: 02/13/2020 CLINICAL DATA:  Hypotension. EXAM: PORTABLE CHEST 1 VIEW COMPARISON:  Prior chest radiographs 12/16/2019 and earlier. FINDINGS: Heart size within normal limits.  Prior median sternotomy/CABG. Aortic atherosclerosis. Unchanged nonspecific chronic prominence of the interstitial lung markings. No appreciable airspace consolidation or frank pulmonary edema. No evidence of pleural effusion or pneumothorax. No acute bony abnormality identified. ACDF hardware within the lower cervical spine. IMPRESSION: No evidence of acute cardiopulmonary abnormality. Chronic nonspecific prominence of the interstitial lung markings, unchanged. Prior median sternotomy/CABG. Aortic Atherosclerosis (ICD10-I70.0). Electronically Signed   By: Kellie Simmering DO   On: 02/13/2020 18:15   DG Abd Portable 1V  Result Date: 03/05/2020 CLINICAL DATA:  Orogastric tube placement. EXAM: PORTABLE ABDOMEN - 1 VIEW COMPARISON:  03/05/2020 at 1311 hours FINDINGS: The enteric tube has been advanced with its tip now projecting more distally in the expected region of the gastric body and its side hole now also well below the diaphragm. There is mild gaseous distension of the colon in the left upper quadrant. Assessment of the lung bases is limited by overpenetration. IMPRESSION: Interval advancement of enteric tube as above. Electronically Signed   By: Logan Bores M.D.   On: 03/05/2020 13:33   DG Abd Portable 1V  Result Date: 03/05/2020 CLINICAL DATA:  Status post OG tube placement. EXAM:  PORTABLE ABDOMEN - 1 VIEW COMPARISON:  None. FINDINGS: OG tube is in place with the side port just above the gastroesophageal junction. The tip is in the stomach. IMPRESSION: As above. Electronically Signed   By: Inge Rise M.D.   On: 03/05/2020 13:21   DG Foot 2 Views Left  Result Date: 02/20/2020 CLINICAL DATA:  Left foot pain EXAM: LEFT FOOT - 2 VIEW COMPARISON:  None. FINDINGS: Mild diffuse soft tissue swelling the foot more focal swelling towards the level of the metatarsal heads. The osseous structures appear diffusely demineralized which may limit detection of small or nondisplaced fractures. No soft tissue gas or foreign body. Questionable lucency through the head of the first metatarsal on lateral radiograph, could reflect a nondisplaced fracture. No other acute osseous abnormality is evident. Mild degenerative changes throughout the foot. 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 IMPRESSION: 1. Mild diffuse soft tissue swelling of the foot, more focal swelling towards the level of the metatarsal heads. 2. Questionable lucency through the head of the first metatarsal on lateral radiograph, could reflect a nondisplaced fracture. Correlate for point tenderness. Electronically Signed   By: Lovena Le M.D.   On: 02/20/2020 19:14   Overnight EEG with video  Result Date: 02/28/2020 Lora Havens, MD     02/28/2020 10:23 AM Patient Name: Antonio Ramirez MRN: [id number] Epilepsy Attending: Lora Havens Referring Physician/Provider: Dr Donnetta Simpers Duration:  02/27/2020 0603 to 02/28/2020 0603  Patient history: 59yo M with left sided twitching.  EEG to evaluate for seizure.  Level of alertness:  comatose  AEDs during EEG study: Keppra, phenytoin, lacosamide, versed  Technical aspects: This EEG study was done with scalp electrodes positioned according to the 10-20 International system of electrode placement. Electrical activity was acquired at a sampling rate of  _0  and reviewed with a high frequency filter of _1  and a low frequency filter of _2 . EEG data were recorded continuously and digitally stored.  Description: EEG initially showed  continuous generalized 3 to 5 Hz theta delta slowing with overriding 15 to 18 Hz generalized beta activity.  Sharp waves were seen in right frontocentral region at times periodic at 0.25 to  0.5 Hz.  Event button was pressed on 02/27/2020 at 0733 and 1505 for left arm rhythmic twitching which was difficult  to visualize on video.  Concomitant EEG showed  increased frequency of sharp waves in her right frontocentral region to 1 Hz. Patient was then started on IV Versed after which EEG showed generalized predominantly 6-_0  theta-alpha activity and 15-_1  beta activity lasting 4-6 seconds alternating with 2-4 seconds of eeg suppression. Frequency of sharp waves reduced after versed. ABNORMALITY -Focal motor seizure, right frontocentral region. - Sharp waves, right frontocentral region - Continuous slow, generalized  IMPRESSION: This study  showed two seizures on 02/27/2020 at 0733 and 1505 arising from right frontocentral region during which patient was noted to have left arm twitching.  Patient was then started on IV Versed drip after which EEG was suggestive of severe diffuse encephalopathy, likely secondary to sedation.  The frequency of sharp waves also improved after starting IV Versed and were not  periodic in nature.   Lora Havens   Portable EEG  Result Date: 02/27/2020 Lora Havens, MD     02/27/2020  8:45 AM Patient Name: Antonio Ramirez MRN: [id number] Epilepsy Attending: Lora Havens Referring Physician/Provider: Dr Donnetta Simpers Date: 02/27/2020 Duration: 24.24 mins Patient history: 59yo M with left sided twitching.  EEG to evaluate for seizure. Level of alertness:  comatose AEDs during EEG study: Keppra, phenytoin, lacosamide Technical aspects: This EEG study was done with scalp electrodes  positioned according to the 10-20 International system of electrode placement. Electrical activity was acquired at a sampling rate of _2  and reviewed with a high frequency filter of _3  and a low frequency filter of _4 . EEG data were recorded continuously and digitally stored. Description: EEG showed continuous generalized 3 to 5 Hz theta delta slowing with overriding 15 to 18 Hz generalized beta activity.  Sharp waves were seen in right frontocentral region at times periodic at 0.25 to  0.5 Hz.  Hyperventilation and photic stimulation were not performed.   ABNORMALITY -Sharp waves, right frontocentral region -Continuous slow, generalized -Excessive beta, generalized IMPRESSION: This study showed evidence of epileptogenicity arising from right frontocentral region, which is at times periodic and suggestive of underlying structural abnormality with high likelihood of seizures.  Additionally, there is evidence of severe diffuse encephalopathy, nonspecific etiology but could be secondary to sedation, seizures.  No seizures were seen throughout the recording. Priyanka [name]   EEG LTVM - Continuous Bedside W/ Video Includes Portable EEG Read  Result Date: 02/26/2020 Lora Havens, MD     02/27/2020  9:07 AM Patient Name: Antonio Ramirez MRN: [id number] Epilepsy Attending: Lora Havens Referring Physician/Provider: Dr Lynnae Sandhoff Duration: 02/25/2020 1805 to 02/26/2020 1705  Patient history: 59 year old male with chronic right parietal and temporal strokes who presented with altered mental status.  EEG to evaluate for seizures.  Level of alertness: Awake,asleep  AEDs during EEG study: LEV, LCM  Technical aspects: This EEG study was done with scalp electrodes positioned according to the 10-20 International system of electrode placement. Electrical activity was acquired at a sampling rate of _5  and reviewed with a high frequency filter of _6  and a low frequency filter of _7 . EEG data were  recorded continuously and digitally stored.  Description: No clear posterior dominant rhythm was seen. Sleep was charcaterized by vertex waves, sleep spindles (12-_8 ), maximal frontocentral region.  EEG showed continuous generalized 3 to 6 Hz theta delta slowing admixed with 15-_9  frontocentral beta activity.  Hyperventilation and photic stimulation were not performed.    ABNORMALITY -Continuous slow, generalized  IMPRESSION: This study is suggestive of mild to moderate  diffuse encephalopathy, nonspecific etiology. No seizures or epileptiform discharges were seen throughout the recording.  Lora Havens   ECHOCARDIOGRAM COMPLETE  Result Date: 02/16/2020    ECHOCARDIOGRAM REPORT   Patient Name:   Antonio Ramirez Date of Exam: 02/16/2020 Medical Rec #:  [id number]         Height:       73.0 in Accession #:    [id number]        Weight:       190.3 lb Date of Birth:  09/23/1960         BSA:          2.106 m Patient Age:    86 years          BP:           93/64 mmHg Patient Gender: M                 HR:           52 bpm. Exam Location:  [facility] Procedure: 2D Echo Indications:    Bacteremia 790.7 / R78.81  History:        Patient has prior history of Echocardiogram examinations, most                 recent 06/01/2019. CAD, COPD; Risk Factors:Current Smoker and                 Dyslipidemia. Opioid dependence.  Sonographer:    Leavy Cella RDCS (AE) Referring Phys: [id number] COURAGE EMOKPAE IMPRESSIONS  1. Left ventricular ejection fraction, by estimation, is 45 to 50%. The left ventricle has mildly decreased function. The left ventricle has no regional wall motion abnormalities. There is mild left ventricular hypertrophy. Left ventricular diastolic parameters are indeterminate.  2. Ventricular septum is flattened in systole consistent with RV pressure overload. . Right ventricular systolic function is severely reduced. The right ventricular size is severely enlarged.  3. Right atrial size was moderately  dilated.  4. The mitral valve is normal in structure. No evidence of mitral valve regurgitation. No evidence of mitral stenosis.  5. AV and LVOT Dopplers are off axis and likely underestimated. Visually there is no significant stenosis or regurgitation. . The aortic valve is tricuspid. Aortic valve regurgitation is not visualized. No aortic stenosis is present. FINDINGS  Left Ventricle: Left ventricular ejection fraction, by estimation, is 45 to 50%. The left ventricle has mildly decreased function. The left ventricle has no regional wall motion abnormalities. The left ventricular internal cavity size was normal in size. There is mild left ventricular hypertrophy. Left ventricular diastolic parameters are indeterminate. Right Ventricle: Ventricular septum is flattened in systole consistent with RV pressure overload. The right ventricular size is severely enlarged. Right vetricular wall thickness was not assessed. Right ventricular systolic function is severely reduced. Left Atrium: Left atrial size was normal in size. Right Atrium: Right atrial size was moderately dilated. Pericardium: There is no evidence of pericardial effusion. Mitral Valve: The mitral valve is normal in structure. No evidence of mitral valve regurgitation. No evidence of mitral valve stenosis. Tricuspid Valve: The tricuspid valve is normal in structure. Tricuspid valve regurgitation is not demonstrated. No evidence of tricuspid stenosis. Aortic Valve: AV and LVOT Dopplers are off axis and likely underestimated. Visually there is no significant stenosis or regurgitation. The aortic valve is tricuspid. Aortic valve regurgitation is not visualized. No aortic stenosis is present. Pulmonic Valve: The pulmonic valve was not well visualized. Pulmonic valve regurgitation is  not visualized. No evidence of pulmonic stenosis. Aorta: The aortic root is normal in size and structure. Pulmonary Artery: Indeterminant PASP, inadequate TR jet. Venous: IVC  assessment for right atrial pressure unable to be performed due to mechanical ventilation. IAS/Shunts: No atrial level shunt detected by color flow Doppler.  LEFT VENTRICLE PLAX 2D LVIDd:         4.16 cm  Diastology LVIDs:         3.30 cm  LV e' medial:    6.31 cm/s LV PW:         1.35 cm  LV E/e' medial:  9.3 LV IVS:        1.00 cm  LV e' lateral:   7.51 cm/s LVOT diam:     1.90 cm  LV E/e' lateral: 7.8 LVOT Area:     2.84 cm  RIGHT VENTRICLE TAPSE (M-mode): 1.0 cm LEFT ATRIUM             Index       RIGHT ATRIUM           Index LA diam:        2.60 cm 1.23 cm/m  RA Area:     15.60 cm LA Vol (A2C):   23.8 ml 11.30 ml/m RA Volume:   46.60 ml  22.12 ml/m LA Vol (A4C):   26.7 ml 12.68 ml/m LA Biplane Vol: 26.0 ml 12.34 ml/m   AORTA Ao Root diam: 3.10 cm MITRAL VALVE               TRICUSPID VALVE MV Area (PHT): 3.37 cm    TR Peak grad:   17.0 mmHg MV Decel Time: 225 msec    TR Vmax:        206.00 cm/s MV E velocity: 58.70 cm/s MV A velocity: 42.40 cm/s  SHUNTS MV E/A ratio:  1.38        Systemic Diam: 1.90 cm Carlyle Dolly MD Electronically signed by Carlyle Dolly MD Signature Date/Time: 02/16/2020/12:32:09 PM    Final         The results of significant diagnostics from this hospitalization (including imaging, microbiology, ancillary and laboratory) are listed below for reference.     Microbiology: Recent Results (from the past 240 hour(s))  Culture, respiratory (non-expectorated)     Status: None   Collection Time: 03/05/20  8:36 AM   Specimen: Tracheal Aspirate; Respiratory  Result Value Ref Range Status   Specimen Description TRACHEAL ASPIRATE  Final   Special Requests NONE  Final   Gram Stain   Final    ABUNDANT WBC PRESENT, PREDOMINANTLY PMN ABUNDANT GRAM POSITIVE RODS    Culture   Final    MODERATE Normal respiratory flora-no Staph aureus or Pseudomonas seen Performed at [facility], [address], [place], [place] [address]    Report Status 03/08/2020 FINAL  Final       Labs: BNP (last 3 results) Recent Labs    06/29/19 1329 12/16/19 1805 02/18/20 0358  BNP 1,099.0* 495.0* 841.6*   Basic Metabolic Panel: Recent Labs  Lab 03/03/20 0428  NA 145  K 3.7  CL 105  CO2 32  GLUCOSE 135*  BUN 27*  CREATININE 0.82  CALCIUM 11.8*  MG 2.1   Liver Function Tests: No results for input(s): AST, ALT, ALKPHOS, BILITOT, PROT, ALBUMIN in the last 168 hours. No results for input(s): LIPASE, AMYLASE in the last 168 hours. No results for input(s): AMMONIA in the last 168 hours. CBC: Recent  Labs  Lab 03/03/20 0428  WBC 20.7*  HGB 16.2  HCT 52.3*  MCV 86.7  PLT 181   Cardiac Enzymes: No results for input(s): CKTOTAL, CKMB, CKMBINDEX, TROPONINI in the last 168 hours. BNP: Invalid input(s): POCBNP CBG: Recent Labs  Lab 03/05/20 1924 03/05/20 2316 03/06/20 0413 03/06/20 0824 03/06/20 1229  GLUCAP 139* 95 125* 124* 138*   D-Dimer No results for input(s): DDIMER in the last 72 hours. Hgb A1c No results for input(s): HGBA1C in the last 72 hours. Lipid Profile No results for input(s): CHOL, HDL, LDLCALC, TRIG, CHOLHDL, LDLDIRECT in the last 72 hours. Thyroid function studies No results for input(s): TSH, T4TOTAL, T3FREE, THYROIDAB in the last 72 hours.  Invalid input(s): FREET3 Anemia work up No results for input(s): VITAMINB12, FOLATE, FERRITIN, TIBC, IRON, RETICCTPCT in the last 72 hours. Urinalysis    Component Value Date/Time   COLORURINE AMBER (A) 02/14/2020 1701   APPEARANCEUR HAZY (A) 02/14/2020 1701   LABSPEC 1.021 02/14/2020 1701   PHURINE 5.0 02/14/2020 1701   GLUCOSEU 50 (A) 02/14/2020 1701   HGBUR LARGE (A) 02/14/2020 1701   BILIRUBINUR NEGATIVE 02/14/2020 1701   KETONESUR NEGATIVE 02/14/2020 1701   PROTEINUR 100 (A) 02/14/2020 1701   NITRITE NEGATIVE 02/14/2020 1701   LEUKOCYTESUR NEGATIVE 02/14/2020 1701   Sepsis Labs Invalid input(s): PROCALCITONIN,  WBC,  LACTICIDVEN   Time coordinating discharge: 35  minutes  SIGNED:  Mercy Riding, MD  [facility] 03/09/2020, 10:00 AM  If 7PM-7AM, please contact night-coverage [email or web address]

## 2020-03-14 DEATH — deceased

## 2020-09-20 IMAGING — DX DG CHEST 1V PORT
1 series · 2 of 2 positions shown · non-contrast
Comparison: 06/29/2019

CLINICAL DATA: Respiratory failure

EXAM:
PORTABLE CHEST 1 VIEW

[Series 1: chest ap · 0.14mm/px · 2 of 2 slices shown]
[im 1/2]
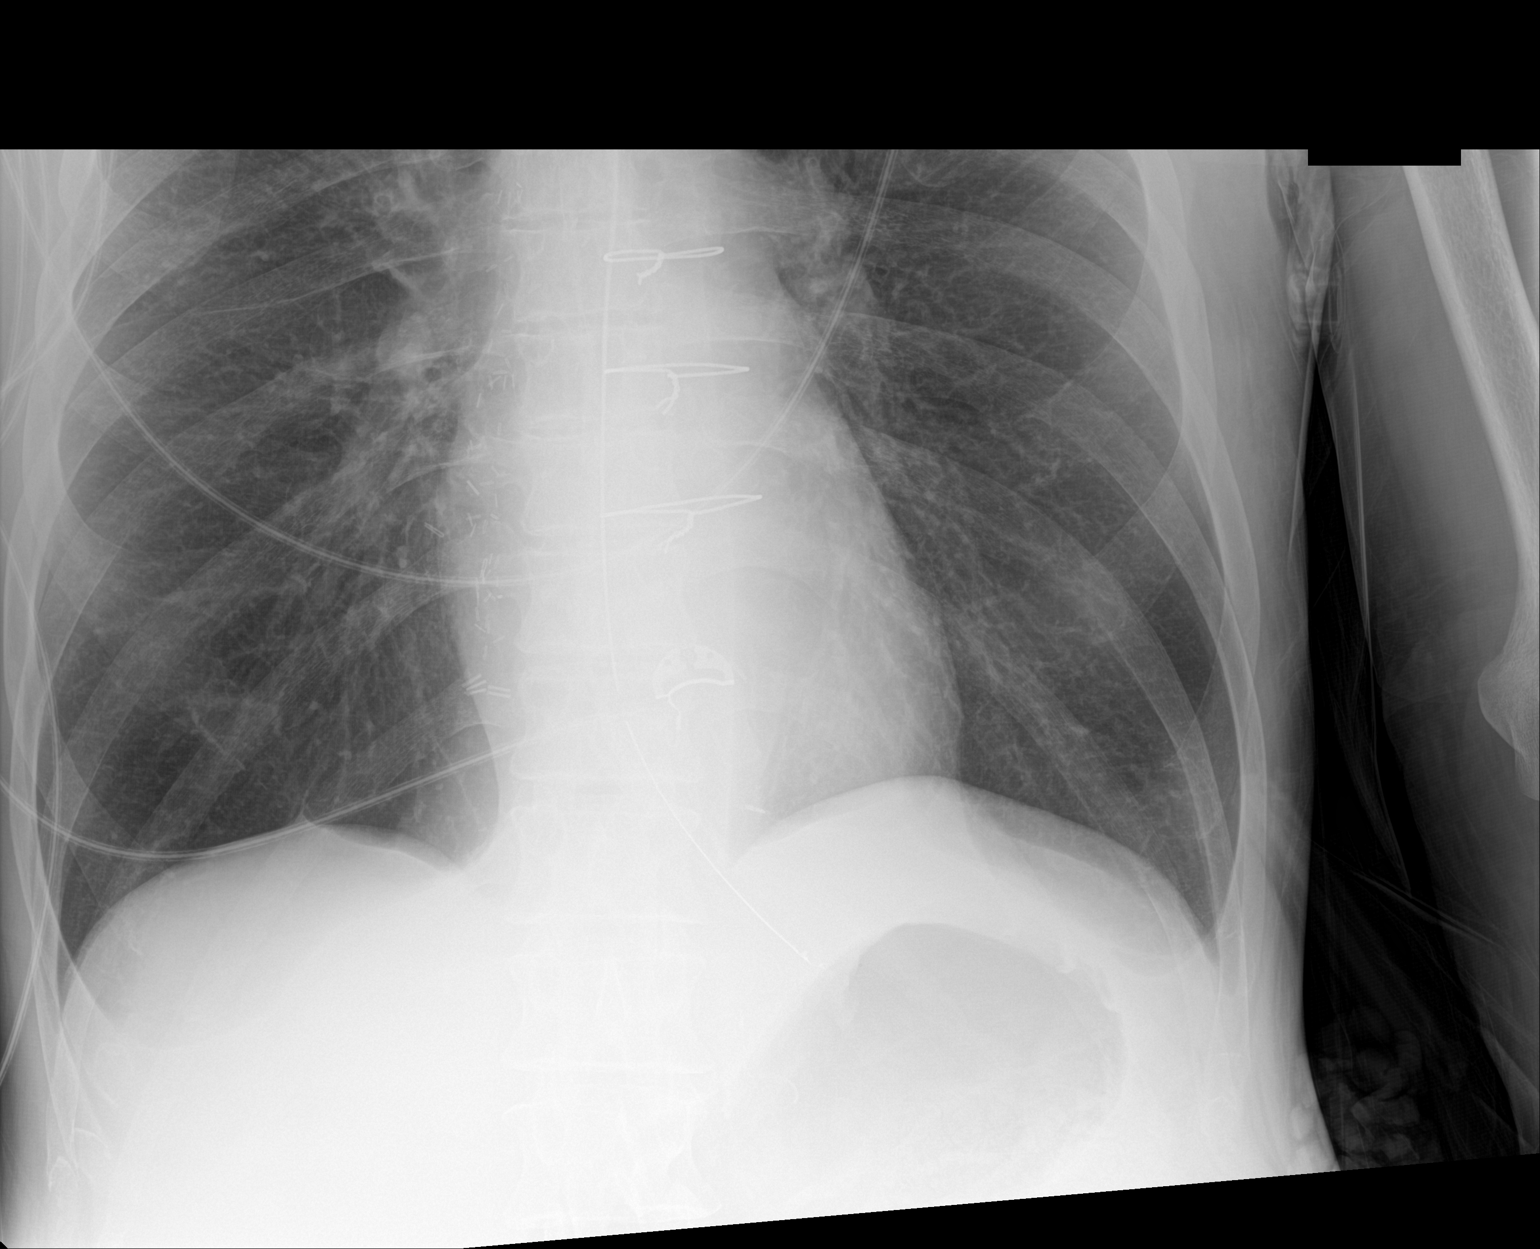
[im 2/2]
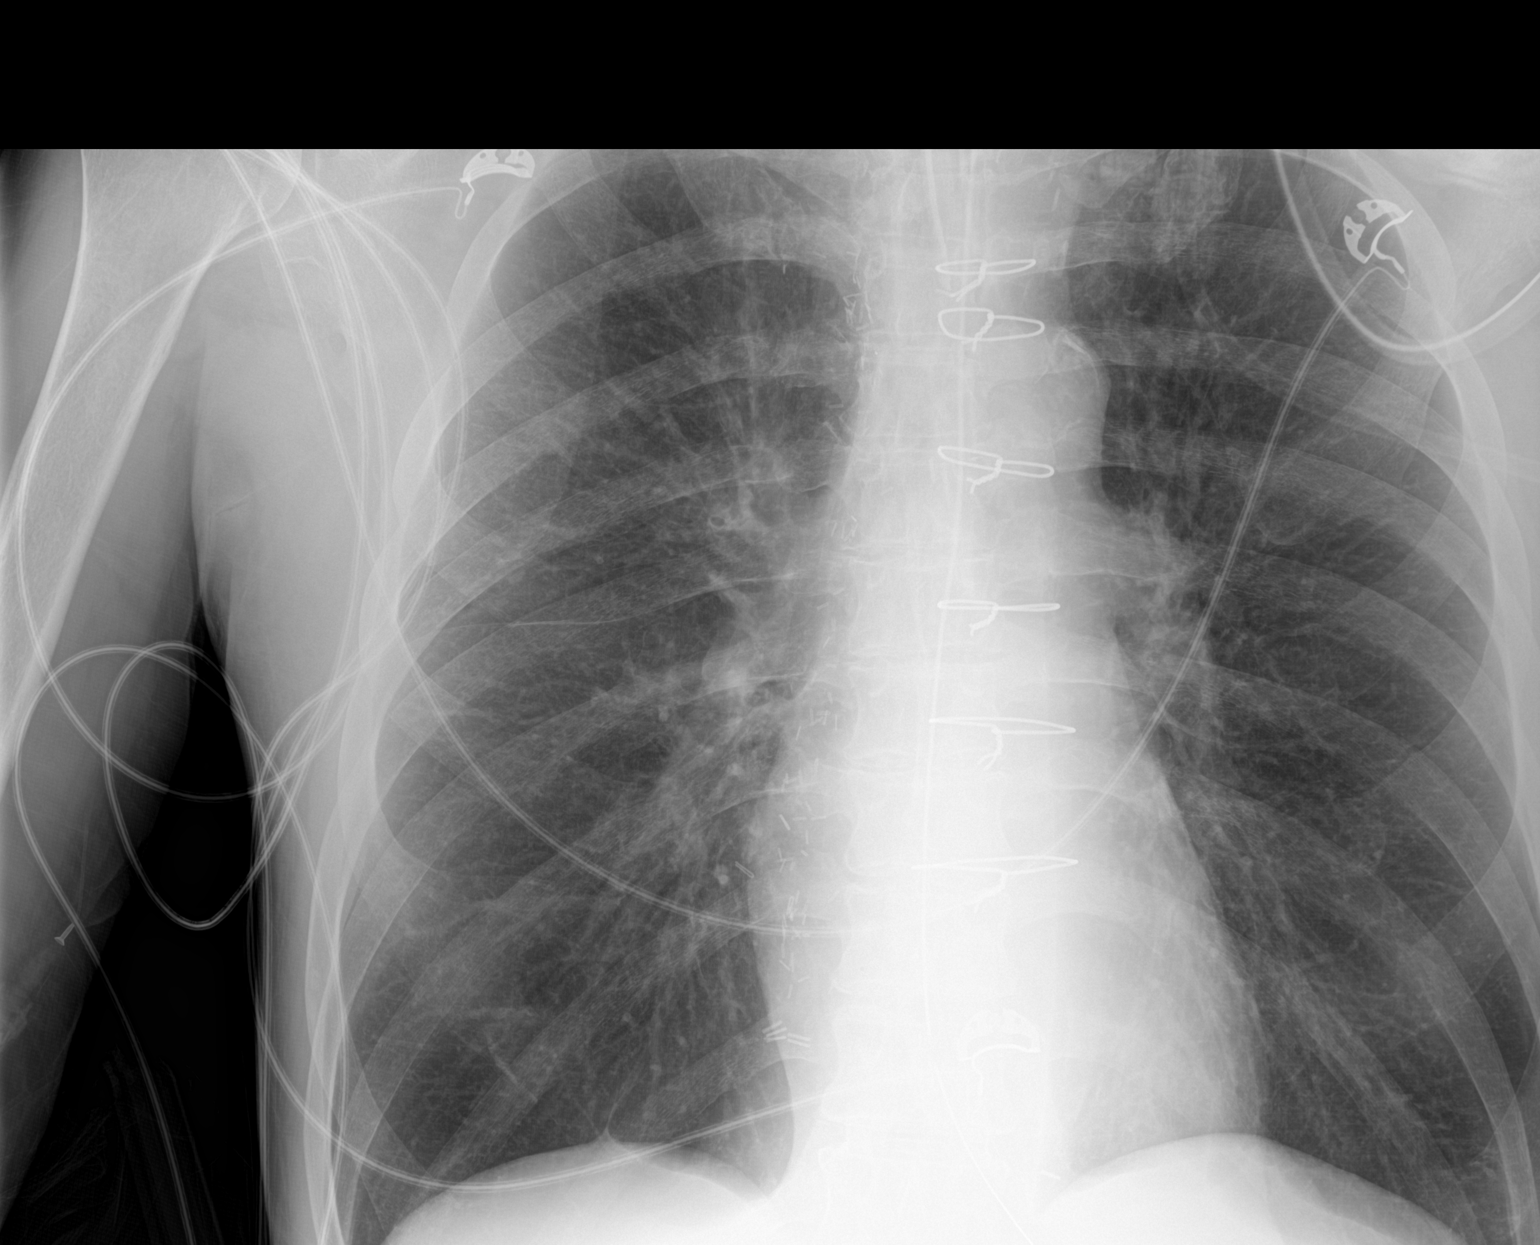

[2 of 2 positions shown; findings below may reference images not displayed]

FINDINGS: No significant interval change in AP portable chest radiograph. No
acute airspace opacity. Esophagogastric tube has been slightly
advanced, tip now below the diaphragm, although side port
significantly above the gastroesophageal junction. Endotracheal tube
remains in appropriate position below the thoracic inlet.
IMPRESSION: 1. No acute abnormality of the lungs.
2. Esophagogastric tube has been slightly advanced, tip now below
the diaphragm, although side port significantly above the
gastroesophageal junction. Recommend further advancement to ensure
subdiaphragmatic positioning of tip and side port.
